# Patient Record
Sex: Female | Born: 1935
Health system: Southern US, Community
[De-identification: ages and names within clinical notes are randomized; demographics above are authoritative.]

## PROBLEM LIST (undated history)

## (undated) DIAGNOSIS — M1990 Unspecified osteoarthritis, unspecified site: Secondary | ICD-10-CM

## (undated) DIAGNOSIS — I471 Supraventricular tachycardia, unspecified: Secondary | ICD-10-CM

## (undated) DIAGNOSIS — I739 Peripheral vascular disease, unspecified: Secondary | ICD-10-CM

## (undated) DIAGNOSIS — I779 Disorder of arteries and arterioles, unspecified: Secondary | ICD-10-CM

## (undated) DIAGNOSIS — I255 Ischemic cardiomyopathy: Secondary | ICD-10-CM

## (undated) DIAGNOSIS — I10 Essential (primary) hypertension: Secondary | ICD-10-CM

## (undated) DIAGNOSIS — E2740 Unspecified adrenocortical insufficiency: Secondary | ICD-10-CM

## (undated) DIAGNOSIS — I639 Cerebral infarction, unspecified: Secondary | ICD-10-CM

## (undated) DIAGNOSIS — I2510 Atherosclerotic heart disease of native coronary artery without angina pectoris: Secondary | ICD-10-CM

## (undated) DIAGNOSIS — E871 Hypo-osmolality and hyponatremia: Secondary | ICD-10-CM

## (undated) DIAGNOSIS — K056 Periodontal disease, unspecified: Secondary | ICD-10-CM

## (undated) DIAGNOSIS — IMO0002 Reserved for concepts with insufficient information to code with codable children: Secondary | ICD-10-CM

## (undated) DIAGNOSIS — E785 Hyperlipidemia, unspecified: Secondary | ICD-10-CM

## (undated) DIAGNOSIS — M8580 Other specified disorders of bone density and structure, unspecified site: Secondary | ICD-10-CM

## (undated) DIAGNOSIS — S72002A Fracture of unspecified part of neck of left femur, initial encounter for closed fracture: Secondary | ICD-10-CM

## (undated) DIAGNOSIS — G459 Transient cerebral ischemic attack, unspecified: Secondary | ICD-10-CM

## (undated) DIAGNOSIS — F419 Anxiety disorder, unspecified: Secondary | ICD-10-CM

## (undated) DIAGNOSIS — T7840XA Allergy, unspecified, initial encounter: Secondary | ICD-10-CM

## (undated) HISTORY — DX: Other specified disorders of bone density and structure, unspecified site: M85.80

## (undated) HISTORY — DX: Disorder of arteries and arterioles, unspecified: I77.9

## (undated) HISTORY — PX: TONSILLECTOMY: SUR1361

## (undated) HISTORY — DX: Atherosclerotic heart disease of native coronary artery without angina pectoris: I25.10

## (undated) HISTORY — DX: Ischemic cardiomyopathy: I25.5

## (undated) HISTORY — DX: Cerebral infarction, unspecified: I63.9

## (undated) HISTORY — DX: Peripheral vascular disease, unspecified: I73.9

## (undated) HISTORY — PX: CARDIAC CATHETERIZATION: SHX172

## (undated) HISTORY — DX: Hyperlipidemia, unspecified: E78.5

## (undated) HISTORY — DX: Transient cerebral ischemic attack, unspecified: G45.9

## (undated) HISTORY — DX: Hypo-osmolality and hyponatremia: E87.1

## (undated) HISTORY — DX: Unspecified osteoarthritis, unspecified site: M19.90

## (undated) HISTORY — DX: Anxiety disorder, unspecified: F41.9

## (undated) HISTORY — DX: Supraventricular tachycardia: I47.1

## (undated) HISTORY — PX: TUBAL LIGATION: SHX77

## (undated) HISTORY — DX: Essential (primary) hypertension: I10

## (undated) HISTORY — DX: Allergy, unspecified, initial encounter: T78.40XA

## (undated) HISTORY — DX: Periodontal disease, unspecified: K05.6

## (undated) HISTORY — DX: Supraventricular tachycardia, unspecified: I47.10

## (undated) HISTORY — DX: Unspecified adrenocortical insufficiency: E27.40

## (undated) HISTORY — DX: Reserved for concepts with insufficient information to code with codable children: IMO0002

## (undated) HISTORY — PX: CHOLECYSTECTOMY: SHX55

---

## 1999-09-05 ENCOUNTER — Other Ambulatory Visit: Admission: RE | Admit: 1999-09-05 | Discharge: 1999-09-05 | Payer: Self-pay | Admitting: *Deleted

## 1999-09-19 ENCOUNTER — Encounter: Payer: Self-pay | Admitting: Family Medicine

## 1999-09-19 ENCOUNTER — Encounter: Admission: RE | Admit: 1999-09-19 | Discharge: 1999-09-19 | Payer: Self-pay | Admitting: Family Medicine

## 2000-09-24 ENCOUNTER — Encounter: Admission: RE | Admit: 2000-09-24 | Discharge: 2000-09-24 | Payer: Self-pay | Admitting: Internal Medicine

## 2000-09-24 ENCOUNTER — Encounter: Payer: Self-pay | Admitting: Internal Medicine

## 2001-07-15 LAB — HM DEXA SCAN

## 2001-09-30 ENCOUNTER — Encounter: Admission: RE | Admit: 2001-09-30 | Discharge: 2001-09-30 | Payer: Self-pay | Admitting: Internal Medicine

## 2001-09-30 ENCOUNTER — Encounter: Payer: Self-pay | Admitting: Internal Medicine

## 2002-01-06 ENCOUNTER — Other Ambulatory Visit: Admission: RE | Admit: 2002-01-06 | Discharge: 2002-01-06 | Payer: Self-pay | Admitting: Family Medicine

## 2002-10-04 ENCOUNTER — Encounter: Payer: Self-pay | Admitting: Family Medicine

## 2002-10-04 ENCOUNTER — Encounter: Admission: RE | Admit: 2002-10-04 | Discharge: 2002-10-04 | Payer: Self-pay | Admitting: Family Medicine

## 2003-03-27 ENCOUNTER — Emergency Department (HOSPITAL_COMMUNITY): Admission: EM | Admit: 2003-03-27 | Discharge: 2003-03-27 | Payer: Self-pay | Admitting: Emergency Medicine

## 2003-03-27 ENCOUNTER — Encounter: Payer: Self-pay | Admitting: Emergency Medicine

## 2003-03-30 ENCOUNTER — Encounter: Admission: RE | Admit: 2003-03-30 | Discharge: 2003-03-30 | Payer: Self-pay | Admitting: Family Medicine

## 2003-03-30 ENCOUNTER — Encounter: Payer: Self-pay | Admitting: Family Medicine

## 2003-05-04 ENCOUNTER — Other Ambulatory Visit: Admission: RE | Admit: 2003-05-04 | Discharge: 2003-05-04 | Payer: Self-pay | Admitting: Family Medicine

## 2003-08-12 ENCOUNTER — Emergency Department (HOSPITAL_COMMUNITY): Admission: EM | Admit: 2003-08-12 | Discharge: 2003-08-13 | Payer: Self-pay | Admitting: Emergency Medicine

## 2003-08-29 ENCOUNTER — Ambulatory Visit (HOSPITAL_COMMUNITY): Admission: RE | Admit: 2003-08-29 | Discharge: 2003-08-29 | Payer: Self-pay | Admitting: Gastroenterology

## 2003-09-12 ENCOUNTER — Encounter (INDEPENDENT_AMBULATORY_CARE_PROVIDER_SITE_OTHER): Payer: Self-pay | Admitting: *Deleted

## 2003-09-12 ENCOUNTER — Ambulatory Visit (HOSPITAL_COMMUNITY): Admission: RE | Admit: 2003-09-12 | Discharge: 2003-09-13 | Payer: Self-pay | Admitting: Surgery

## 2003-10-19 ENCOUNTER — Encounter: Admission: RE | Admit: 2003-10-19 | Discharge: 2003-10-19 | Payer: Self-pay | Admitting: Family Medicine

## 2004-02-21 ENCOUNTER — Inpatient Hospital Stay (HOSPITAL_COMMUNITY): Admission: EM | Admit: 2004-02-21 | Discharge: 2004-02-28 | Payer: Self-pay | Admitting: *Deleted

## 2004-03-04 ENCOUNTER — Emergency Department (HOSPITAL_COMMUNITY): Admission: EM | Admit: 2004-03-04 | Discharge: 2004-03-04 | Payer: Self-pay | Admitting: Emergency Medicine

## 2004-03-15 HISTORY — PX: ESOPHAGOGASTRODUODENOSCOPY: SHX1529

## 2004-03-22 ENCOUNTER — Inpatient Hospital Stay (HOSPITAL_COMMUNITY): Admission: EM | Admit: 2004-03-22 | Discharge: 2004-03-28 | Payer: Self-pay | Admitting: *Deleted

## 2004-03-26 ENCOUNTER — Encounter (INDEPENDENT_AMBULATORY_CARE_PROVIDER_SITE_OTHER): Payer: Self-pay | Admitting: *Deleted

## 2004-05-05 ENCOUNTER — Inpatient Hospital Stay (HOSPITAL_COMMUNITY): Admission: EM | Admit: 2004-05-05 | Discharge: 2004-05-05 | Payer: Self-pay | Admitting: Emergency Medicine

## 2004-05-15 ENCOUNTER — Emergency Department (HOSPITAL_COMMUNITY): Admission: EM | Admit: 2004-05-15 | Discharge: 2004-05-16 | Payer: Self-pay | Admitting: Emergency Medicine

## 2004-05-16 ENCOUNTER — Ambulatory Visit: Payer: Self-pay | Admitting: *Deleted

## 2004-05-25 ENCOUNTER — Ambulatory Visit: Payer: Self-pay | Admitting: Family Medicine

## 2004-05-30 ENCOUNTER — Ambulatory Visit: Payer: Self-pay | Admitting: *Deleted

## 2004-06-13 ENCOUNTER — Ambulatory Visit: Payer: Self-pay | Admitting: Family Medicine

## 2004-06-18 ENCOUNTER — Ambulatory Visit (HOSPITAL_COMMUNITY): Admission: RE | Admit: 2004-06-18 | Discharge: 2004-06-18 | Payer: Self-pay | Admitting: Cardiology

## 2004-06-27 ENCOUNTER — Ambulatory Visit: Payer: Self-pay | Admitting: Family Medicine

## 2004-07-11 ENCOUNTER — Ambulatory Visit: Payer: Self-pay | Admitting: Family Medicine

## 2004-07-25 ENCOUNTER — Ambulatory Visit: Payer: Self-pay | Admitting: Family Medicine

## 2004-08-07 ENCOUNTER — Ambulatory Visit: Payer: Self-pay | Admitting: *Deleted

## 2004-08-17 ENCOUNTER — Ambulatory Visit: Payer: Self-pay | Admitting: Family Medicine

## 2004-08-21 ENCOUNTER — Ambulatory Visit: Payer: Self-pay | Admitting: Professional

## 2004-08-28 ENCOUNTER — Ambulatory Visit: Payer: Self-pay | Admitting: Professional

## 2004-08-31 ENCOUNTER — Ambulatory Visit: Payer: Self-pay | Admitting: Family Medicine

## 2004-09-04 ENCOUNTER — Ambulatory Visit: Payer: Self-pay | Admitting: Professional

## 2004-09-11 ENCOUNTER — Ambulatory Visit: Payer: Self-pay | Admitting: Professional

## 2004-09-14 ENCOUNTER — Ambulatory Visit: Payer: Self-pay | Admitting: Family Medicine

## 2004-09-25 ENCOUNTER — Ambulatory Visit: Payer: Self-pay | Admitting: Professional

## 2004-09-28 ENCOUNTER — Ambulatory Visit: Payer: Self-pay | Admitting: Family Medicine

## 2004-10-09 ENCOUNTER — Ambulatory Visit: Payer: Self-pay | Admitting: Professional

## 2004-10-12 ENCOUNTER — Ambulatory Visit: Payer: Self-pay | Admitting: Family Medicine

## 2004-11-05 ENCOUNTER — Ambulatory Visit: Payer: Self-pay | Admitting: Family Medicine

## 2004-11-05 ENCOUNTER — Encounter: Admission: RE | Admit: 2004-11-05 | Discharge: 2004-11-05 | Payer: Self-pay | Admitting: Family Medicine

## 2004-12-12 ENCOUNTER — Ambulatory Visit: Payer: Self-pay | Admitting: Family Medicine

## 2005-01-07 ENCOUNTER — Ambulatory Visit: Payer: Self-pay | Admitting: Family Medicine

## 2005-05-10 ENCOUNTER — Ambulatory Visit: Payer: Self-pay | Admitting: Family Medicine

## 2005-10-22 ENCOUNTER — Ambulatory Visit: Payer: Self-pay | Admitting: Family Medicine

## 2005-11-06 ENCOUNTER — Encounter: Admission: RE | Admit: 2005-11-06 | Discharge: 2005-11-06 | Payer: Self-pay | Admitting: Family Medicine

## 2005-12-30 ENCOUNTER — Ambulatory Visit: Payer: Self-pay | Admitting: Family Medicine

## 2005-12-30 ENCOUNTER — Other Ambulatory Visit: Admission: RE | Admit: 2005-12-30 | Discharge: 2005-12-30 | Payer: Self-pay | Admitting: Family Medicine

## 2005-12-30 ENCOUNTER — Encounter: Payer: Self-pay | Admitting: Family Medicine

## 2006-01-09 LAB — FECAL OCCULT BLOOD, GUAIAC: Fecal Occult Blood: NEGATIVE

## 2006-01-10 ENCOUNTER — Ambulatory Visit: Payer: Self-pay | Admitting: Family Medicine

## 2006-04-22 ENCOUNTER — Ambulatory Visit: Payer: Self-pay | Admitting: Family Medicine

## 2006-11-20 ENCOUNTER — Encounter: Admission: RE | Admit: 2006-11-20 | Discharge: 2006-11-20 | Payer: Self-pay | Admitting: Family Medicine

## 2006-11-24 ENCOUNTER — Encounter (INDEPENDENT_AMBULATORY_CARE_PROVIDER_SITE_OTHER): Payer: Self-pay | Admitting: *Deleted

## 2006-12-19 ENCOUNTER — Encounter: Payer: Self-pay | Admitting: Family Medicine

## 2006-12-19 DIAGNOSIS — E1169 Type 2 diabetes mellitus with other specified complication: Secondary | ICD-10-CM

## 2006-12-19 DIAGNOSIS — B009 Herpesviral infection, unspecified: Secondary | ICD-10-CM | POA: Insufficient documentation

## 2006-12-19 DIAGNOSIS — M1990 Unspecified osteoarthritis, unspecified site: Secondary | ICD-10-CM | POA: Insufficient documentation

## 2006-12-19 DIAGNOSIS — A048 Other specified bacterial intestinal infections: Secondary | ICD-10-CM | POA: Insufficient documentation

## 2006-12-19 DIAGNOSIS — I2510 Atherosclerotic heart disease of native coronary artery without angina pectoris: Secondary | ICD-10-CM

## 2006-12-19 DIAGNOSIS — E871 Hypo-osmolality and hyponatremia: Secondary | ICD-10-CM | POA: Insufficient documentation

## 2006-12-19 DIAGNOSIS — J309 Allergic rhinitis, unspecified: Secondary | ICD-10-CM | POA: Insufficient documentation

## 2006-12-19 DIAGNOSIS — F411 Generalized anxiety disorder: Secondary | ICD-10-CM | POA: Insufficient documentation

## 2006-12-19 DIAGNOSIS — E11319 Type 2 diabetes mellitus with unspecified diabetic retinopathy without macular edema: Secondary | ICD-10-CM

## 2006-12-19 DIAGNOSIS — E119 Type 2 diabetes mellitus without complications: Secondary | ICD-10-CM

## 2006-12-19 DIAGNOSIS — I10 Essential (primary) hypertension: Secondary | ICD-10-CM | POA: Insufficient documentation

## 2006-12-19 DIAGNOSIS — M8580 Other specified disorders of bone density and structure, unspecified site: Secondary | ICD-10-CM

## 2006-12-19 DIAGNOSIS — E782 Mixed hyperlipidemia: Secondary | ICD-10-CM

## 2006-12-19 DIAGNOSIS — E1351 Other specified diabetes mellitus with diabetic peripheral angiopathy without gangrene: Secondary | ICD-10-CM | POA: Insufficient documentation

## 2006-12-22 ENCOUNTER — Ambulatory Visit: Payer: Self-pay | Admitting: Family Medicine

## 2006-12-23 LAB — CONVERTED CEMR LAB
Albumin: 3.9 g/dL (ref 3.5–5.2)
BUN: 27 mg/dL — ABNORMAL HIGH (ref 6–23)
Calcium: 9.7 mg/dL (ref 8.4–10.5)
Chloride: 105 meq/L (ref 96–112)
Cholesterol: 147 mg/dL (ref 0–200)
Eosinophils Absolute: 0.2 10*3/uL (ref 0.0–0.6)
Eosinophils Relative: 1.7 % (ref 0.0–5.0)
GFR calc Af Amer: 70 mL/min
Glucose, Bld: 196 mg/dL — ABNORMAL HIGH (ref 70–99)
HCT: 39.9 % (ref 36.0–46.0)
HDL: 32.1 mg/dL — ABNORMAL LOW (ref >39.0)
Hemoglobin: 13.8 g/dL (ref 12.0–15.0)
MCV: 90.2 fL (ref 78.0–100.0)
Monocytes Relative: 8 % (ref 3.0–11.0)
Neutrophils Relative %: 54.6 % (ref 43.0–77.0)
Phosphorus: 3.8 mg/dL (ref 2.3–4.6)
Potassium: 5.6 meq/L — ABNORMAL HIGH (ref 3.5–5.1)
VLDL: 43 mg/dL — ABNORMAL HIGH (ref 0–40)

## 2007-01-28 ENCOUNTER — Ambulatory Visit: Payer: Self-pay | Admitting: Family Medicine

## 2007-02-27 ENCOUNTER — Ambulatory Visit: Payer: Self-pay | Admitting: Family Medicine

## 2007-04-15 ENCOUNTER — Ambulatory Visit: Payer: Self-pay | Admitting: Family Medicine

## 2007-04-20 ENCOUNTER — Ambulatory Visit: Payer: Self-pay | Admitting: Family Medicine

## 2007-04-22 LAB — CONVERTED CEMR LAB
Albumin: 3.8 g/dL (ref 3.5–5.2)
BUN: 28 mg/dL — ABNORMAL HIGH (ref 6–23)
CO2: 26 meq/L (ref 19–32)
GFR calc Af Amer: 79 mL/min
GFR calc non Af Amer: 66 mL/min
Glucose, Bld: 115 mg/dL — ABNORMAL HIGH (ref 70–99)
Hgb A1c MFr Bld: 6.9 % — ABNORMAL HIGH (ref 4.6–6.0)
Sodium: 137 meq/L (ref 135–145)

## 2007-08-17 ENCOUNTER — Ambulatory Visit: Payer: Self-pay | Admitting: Family Medicine

## 2007-08-18 LAB — CONVERTED CEMR LAB
ALT: 21 units/L (ref 0–35)
AST: 25 units/L (ref 0–37)
CO2: 27 meq/L (ref 19–32)
Chloride: 103 meq/L (ref 96–112)
Creatinine, Ser: 1.1 mg/dL (ref 0.4–1.2)
Creatinine,U: 153 mg/dL
GFR calc non Af Amer: 52 mL/min
Hgb A1c MFr Bld: 6.5 % — ABNORMAL HIGH (ref 4.6–6.0)
LDL Cholesterol: 67 mg/dL (ref 0–99)
Microalb Creat Ratio: 4.6 mg/g (ref 0.0–30.0)
Microalb, Ur: 0.7 mg/dL (ref 0.0–1.9)
Phosphorus: 4.1 mg/dL (ref 2.3–4.6)
Triglycerides: 162 mg/dL — ABNORMAL HIGH (ref 0–149)
VLDL: 32 mg/dL (ref 0–40)

## 2007-12-01 ENCOUNTER — Encounter: Admission: RE | Admit: 2007-12-01 | Discharge: 2007-12-01 | Payer: Self-pay | Admitting: Family Medicine

## 2007-12-04 ENCOUNTER — Encounter (INDEPENDENT_AMBULATORY_CARE_PROVIDER_SITE_OTHER): Payer: Self-pay | Admitting: *Deleted

## 2008-02-15 ENCOUNTER — Ambulatory Visit: Payer: Self-pay | Admitting: Family Medicine

## 2008-02-16 LAB — CONVERTED CEMR LAB
ALT: 17 units/L (ref 0–35)
Albumin: 3.5 g/dL (ref 3.5–5.2)
CO2: 26 meq/L (ref 19–32)
Chloride: 101 meq/L (ref 96–112)
Cholesterol: 121 mg/dL (ref 0–200)
Direct LDL: 63.9 mg/dL
GFR calc Af Amer: 70 mL/min
GFR calc non Af Amer: 58 mL/min
Phosphorus: 3.1 mg/dL (ref 2.3–4.6)
Total CHOL/HDL Ratio: 4.5
Triglycerides: 227 mg/dL (ref 0–149)

## 2008-04-13 ENCOUNTER — Ambulatory Visit: Payer: Self-pay | Admitting: Family Medicine

## 2008-05-16 ENCOUNTER — Ambulatory Visit: Payer: Self-pay | Admitting: Family Medicine

## 2008-05-17 LAB — CONVERTED CEMR LAB
ALT: 19 units/L (ref 0–35)
CO2: 29 meq/L (ref 19–32)
Calcium: 9.7 mg/dL (ref 8.4–10.5)
Cholesterol: 141 mg/dL (ref 0–200)
GFR calc Af Amer: 79 mL/min
GFR calc non Af Amer: 65 mL/min
HDL: 34.4 mg/dL — ABNORMAL LOW (ref >39.0)
Hgb A1c MFr Bld: 6.7 % — ABNORMAL HIGH (ref 4.6–6.0)
Phosphorus: 4.4 mg/dL (ref 2.3–4.6)
Sodium: 139 meq/L (ref 135–145)
VLDL: 48 mg/dL — ABNORMAL HIGH (ref 0–40)

## 2009-03-08 ENCOUNTER — Ambulatory Visit: Payer: Self-pay | Admitting: Family Medicine

## 2009-03-09 LAB — CONVERTED CEMR LAB
Albumin: 4 g/dL (ref 3.5–5.2)
Basophils Absolute: 0 10*3/uL (ref 0.0–0.1)
Basophils Relative: 0.1 % (ref 0.0–3.0)
CO2: 27 meq/L (ref 19–32)
Chloride: 105 meq/L (ref 96–112)
Creatinine,U: 198.4 mg/dL
Eosinophils Relative: 4 % (ref 0.0–5.0)
HDL: 32.1 mg/dL — ABNORMAL LOW (ref >39.00)
Hgb A1c MFr Bld: 7.1 % — ABNORMAL HIGH (ref 4.6–6.5)
LDL Cholesterol: 50 mg/dL (ref 0–99)
Lymphocytes Relative: 33.4 % (ref 12.0–46.0)
Lymphs Abs: 3.4 10*3/uL (ref 0.7–4.0)
Microalb Creat Ratio: 7.1 mg/g (ref 0.0–30.0)
Neutro Abs: 5.7 10*3/uL (ref 1.4–7.7)
Neutrophils Relative %: 56.9 % (ref 43.0–77.0)
Phosphorus: 4 mg/dL (ref 2.3–4.6)
RDW: 12.3 % (ref 11.5–14.6)
Sodium: 140 meq/L (ref 135–145)
Total CHOL/HDL Ratio: 4
Triglycerides: 185 mg/dL — ABNORMAL HIGH (ref 0.0–149.0)

## 2009-03-21 ENCOUNTER — Encounter: Admission: RE | Admit: 2009-03-21 | Discharge: 2009-03-21 | Payer: Self-pay | Admitting: Family Medicine

## 2009-03-24 ENCOUNTER — Encounter (INDEPENDENT_AMBULATORY_CARE_PROVIDER_SITE_OTHER): Payer: Self-pay | Admitting: *Deleted

## 2009-04-25 ENCOUNTER — Ambulatory Visit: Payer: Self-pay | Admitting: Family Medicine

## 2009-06-14 ENCOUNTER — Ambulatory Visit: Payer: Self-pay | Admitting: Family Medicine

## 2009-06-14 LAB — CONVERTED CEMR LAB: Hgb A1c MFr Bld: 7.1 % — ABNORMAL HIGH (ref 4.6–6.5)

## 2009-06-20 ENCOUNTER — Ambulatory Visit: Payer: Self-pay | Admitting: Family Medicine

## 2009-08-16 ENCOUNTER — Ambulatory Visit: Payer: Self-pay | Admitting: Family Medicine

## 2009-08-28 ENCOUNTER — Ambulatory Visit: Payer: Self-pay | Admitting: Family Medicine

## 2009-09-18 ENCOUNTER — Ambulatory Visit: Payer: Self-pay | Admitting: Family Medicine

## 2009-09-19 LAB — CONVERTED CEMR LAB
CO2: 26 meq/L (ref 19–32)
Calcium: 9.7 mg/dL (ref 8.4–10.5)
Cholesterol: 120 mg/dL (ref 0–200)
Direct LDL: 55.3 mg/dL
HDL: 36.8 mg/dL — ABNORMAL LOW (ref >39.00)
Hgb A1c MFr Bld: 6.7 % — ABNORMAL HIGH (ref 4.6–6.5)
Sodium: 137 meq/L (ref 135–145)
Triglycerides: 332 mg/dL — ABNORMAL HIGH (ref 0.0–149.0)
VLDL: 66.4 mg/dL — ABNORMAL HIGH (ref 0.0–40.0)

## 2009-09-25 ENCOUNTER — Ambulatory Visit: Payer: Self-pay | Admitting: Family Medicine

## 2009-09-25 ENCOUNTER — Encounter: Admission: RE | Admit: 2009-09-25 | Discharge: 2009-09-25 | Payer: Self-pay | Admitting: Family Medicine

## 2010-02-13 ENCOUNTER — Ambulatory Visit: Payer: Self-pay | Admitting: Family Medicine

## 2010-02-14 LAB — CONVERTED CEMR LAB
ALT: 16 units/L (ref 0–35)
AST: 21 units/L (ref 0–37)
Albumin: 3.8 g/dL (ref 3.5–5.2)
CO2: 29 meq/L (ref 19–32)
Chloride: 110 meq/L (ref 96–112)
Creatinine, Ser: 0.9 mg/dL (ref 0.4–1.2)
GFR calc non Af Amer: 66.76 mL/min (ref >60)
HDL: 28.5 mg/dL — ABNORMAL LOW (ref >39.00)
Hgb A1c MFr Bld: 6.6 % — ABNORMAL HIGH (ref 4.6–6.5)
LDL Cholesterol: 51 mg/dL (ref 0–99)
Microalb Creat Ratio: 1.4 mg/g (ref 0.0–30.0)
Phosphorus: 4.5 mg/dL (ref 2.3–4.6)
Potassium: 5.5 meq/L — ABNORMAL HIGH (ref 3.5–5.1)
Sodium: 143 meq/L (ref 135–145)

## 2010-02-20 ENCOUNTER — Ambulatory Visit: Payer: Self-pay | Admitting: Family Medicine

## 2010-02-28 ENCOUNTER — Encounter: Payer: Self-pay | Admitting: Family Medicine

## 2010-03-06 ENCOUNTER — Ambulatory Visit: Payer: Self-pay | Admitting: Family Medicine

## 2010-03-06 DIAGNOSIS — E875 Hyperkalemia: Secondary | ICD-10-CM

## 2010-03-07 ENCOUNTER — Encounter: Payer: Self-pay | Admitting: Family Medicine

## 2010-03-07 LAB — CONVERTED CEMR LAB: Potassium: 5.3 meq/L — ABNORMAL HIGH (ref 3.5–5.1)

## 2010-03-21 ENCOUNTER — Ambulatory Visit: Payer: Self-pay | Admitting: Family Medicine

## 2010-03-22 LAB — CONVERTED CEMR LAB: Potassium: 5.5 meq/L — ABNORMAL HIGH

## 2010-03-27 ENCOUNTER — Ambulatory Visit: Payer: Self-pay | Admitting: Family Medicine

## 2010-04-03 ENCOUNTER — Ambulatory Visit: Payer: Self-pay | Admitting: Family Medicine

## 2010-04-20 ENCOUNTER — Telehealth: Payer: Self-pay | Admitting: Family Medicine

## 2010-05-08 ENCOUNTER — Ambulatory Visit: Payer: Self-pay | Admitting: Family Medicine

## 2010-05-09 ENCOUNTER — Encounter: Admission: RE | Admit: 2010-05-09 | Discharge: 2010-05-09 | Payer: Self-pay | Admitting: Family Medicine

## 2010-05-15 ENCOUNTER — Encounter: Payer: Self-pay | Admitting: Family Medicine

## 2010-05-21 ENCOUNTER — Encounter: Admission: RE | Admit: 2010-05-21 | Discharge: 2010-05-21 | Payer: Self-pay | Admitting: Family Medicine

## 2010-06-14 HISTORY — PX: GUM SURGERY: SHX658

## 2010-08-04 ENCOUNTER — Encounter: Payer: Self-pay | Admitting: Family Medicine

## 2010-08-12 LAB — CONVERTED CEMR LAB
CO2: 29 meq/L (ref 19–32)
Calcium: 9.9 mg/dL (ref 8.4–10.5)
Cortisol, Plasma: 5.3 ug/dL
Creatinine, Ser: 0.8 mg/dL (ref 0.4–1.2)
GFR calc non Af Amer: 74.5 mL/min (ref >60)
Glucose, Bld: 125 mg/dL — ABNORMAL HIGH (ref 70–99)

## 2010-08-16 NOTE — Assessment & Plan Note (Signed)
Summary: 3 MONTH FOLLOW UP/[facility]   Vital Signs:  Patient profile:   77 year old female Height:      61.5 inches Weight:      124.50 pounds BMI:     23.23 Temp:     97.8 degrees F oral Pulse rate:   64 / minute Pulse rhythm:   regular BP sitting:   130 / 70  (left arm) Cuff size:   regular  Vitals Entered By: Lewanda Rife LPN (September 25, 2009 9:19 AM)  Serial Vital Signs/Assessments:  Time      Position  BP       Pulse  Resp  Temp     By                     130/70                         Kairy Part MD   History of Present Illness: here for f/u of DM and HTN and DM  wt is down 1 lb  bp 144/70 on first check  DM is imp with AIC 6.7 (down from 7.1)) is happy about that  cut out her sweets/ chocolate  not much exercise due to uri  opthy was in oct - Dr Larence Penning  saw Dr Hetty Ely twice -- for uri  gets better than worse  cough gets worse at times- prod for 6 weeks  took doxycycline no fever  some allergies   chol in good control but trig 332 (up from prev) ? what she ate the day before -- ate lunch at church    K a little high at 5.3- is on lisinopril  no supplements  eats a banana every day   Allergies: 1)  ! * Ilosone 2)  ! * Lotrel 3)  Erythromycin 4)  * Plavix 5)  Ace Inhibitors 6)  * Pneumovax  Past History:  Past Medical History: Last updated: 02/15/2008 Allergic rhinitis Anxiety Diabetes mellitus, type II Hyperlipidemia Hypertension Osteoarthritis Osteopenia Peripheral vascular disease OA/spurs in toe 08  cardiology-- Dr Lucas Mallow (retired 09)  Past Surgical History: Last updated: Jan 01, 2007 Cholecystectomy Tubal ligation Tonsillectomy Persantine cardiolite- normal (2001) Dexa- osteopenia hips (1999) osteopenia hip, fem neck (2003) HIDA scan - abn (08/2003) Cardiac cath- stents x 2, PTCA x 1 (02/2004) EGD- neg. 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  no sinus tenderness  Eyes:  Conjunctiva clear bilaterally.  Ears:  R ear normal and L ear normal.   Nose:  no nasal discharge.   Mouth:  pharynx pink and moist.   Neck:  supple with full rom and no masses or thyromegally, no JVD or carotid bruit  Lungs:  CTA with harsh bs at bases - no rales or rhonchi  or wheeze Heart:  Normal rate and regular rhythm. S1 and S2 normal without gallop, murmur, click, rub or other extra sounds. Abdomen:  Bowel sounds positive,abdomen soft and non-tender without masses, organomegaly or hernias noted. no renal bruits Msk:  No deformity or scoliosis noted of thoracic or lumbar spine.   Pulses:  plus one pedal pulses  Extremities:  No clubbing, cyanosis, edema, or deformity noted with normal full range of motion of all joints.   Neurologic:  sensation intact to light touch, gait normal, and DTRs symmetrical and normal.   Skin:  Intact without suspicious lesions or rashes Cervical Nodes:  No lymphadenopathy noted Psych:  normal affect, talkative and pleasant   Diabetes Management Exam:    Foot Exam (with socks and/or shoes not present):       Sensory-Pinprick/Light touch:          Left medial foot (L-4): normal          Left dorsal foot (L-5): normal          Left lateral foot (S-1): normal          Right medial foot (L-4): normal          Right dorsal foot (L-5): normal          Right lateral foot (S-1): normal       Sensory-Monofilament:          Left foot: normal           Right foot: normal       Inspection:          Left foot: normal          Right foot: normal       Nails:          Left foot: normal          Right foot: normal    Eye Exam:       Eye Exam done elsewhere          Date: 04/14/2009          Results: diabetic retinopathy          Done by: Dr Larence Penning    Impression & Recommendations:  Problem # 1:  COUGH (ICD-786.2) Assessment New ongoing 6 weeks after uri and tx with doxy-- is slowly imp but still prod  harsh bs on exam no wheeze send for cxr  if neg and cough continues -may need to consider poss of ace cough Orders: Radiology Referral (Radiology)  Problem # 2:  HYPERTENSION (ICD-401.9) Assessment: Unchanged  bp better on second check today no change in med if K goes up -may have to change ace  adv to stop bananas and will re check Her updated medication list for this problem includes:    Toprol Xl 100 Mg Tb24 (Metoprolol succinate) .Marland Kitchen... Take one by mouth daily    Lisinopril 40 Mg Tabs (Lisinopril) ..... One by mouth daily    Norvasc 5 Mg Tabs (Amlodipine besylate) .Marland Kitchen... Take one by mouth at bedtime  BP today: 144/70- re check 130/70 Prior BP: 148/74 (08/28/2009)  Labs Reviewed: K+: 5.3 (09/18/2009) Creat: : 1.1 (09/18/2009)   Chol: 120 (09/18/2009)   HDL: 36.80 (09/18/2009)   LDL: 50 (03/08/2009)   TG: 332.0 (09/18/2009)  Problem # 3:  DIABETES MELLITUS, TYPE II (ICD-250.00) Assessment: Improved  commended on imp with this and diet  will get back to exercise when able utd opthy - q 6 mo  f/u and lab in 6 mo  Her updated medication list for this problem includes:    Sg Enteric Coated Aspirin Tbec (Aspirin tbec) .Marland Kitchen... Take 81 mg by mouth daily    Lisinopril 40 Mg Tabs (Lisinopril) ..... One by mouth daily  Metformin Hcl 1000 Mg Tabs (Metformin hcl) .Marland Kitchen... 1 by mouth two times a day    Glucotrol Xl 5 Mg Xr24h-tab (Glipizide) .Marland Kitchen... 1 by mouth once daily in am  Labs Reviewed: Creat: 1.1 (09/18/2009)     Last Eye  Exam: normal (04/14/2008) Reviewed HgBA1c results: 6.7 (09/18/2009)  7.1 (06/14/2009)  Problem # 4:  HYPERLIPIDEMIA (ICD-272.4) Assessment: Unchanged  good control- do not know why trig high- may have been eating out day before re check 6 mo rev fats in diet  get back to exercise  Her updated medication list for this problem includes:    Lipitor 10 Mg Tabs (Atorvastatin calcium) .Marland Kitchen... Take one by mouth at bedtime  Labs Reviewed: SGOT: 28 (09/18/2009)   SGPT: 20 (09/18/2009)   HDL:36.80 (09/18/2009), 32.10 (03/08/2009)  LDL:50 (03/08/2009), DEL (09/81/1914)  Chol:120 (09/18/2009), 119 (03/08/2009)  Trig:332.0 (09/18/2009), 185.0 (03/08/2009)  Complete Medication List: 1)  Toprol Xl 100 Mg Tb24 (Metoprolol succinate) .... Take one by mouth daily 2)  Lipitor 10 Mg Tabs (Atorvastatin calcium) .... Take one by mouth at bedtime 3)  Sg Enteric Coated Aspirin Tbec (Aspirin tbec) .... Take 81 mg by mouth daily 4)  Lisinopril 40 Mg Tabs (Lisinopril) .... One by mouth daily 5)  Norvasc 5 Mg Tabs (Amlodipine besylate) .... Take one by mouth at bedtime 6)  Paxil 40 Mg Tabs (Paroxetine hcl) .... Take one by mouth daily 7)  Precision Extra Diabetes Test Strips  .... To check glucose once daily and as needed (250.0) 8)  Metformin Hcl 1000 Mg Tabs (Metformin hcl) .Marland Kitchen.. 1 by mouth two times a day 9)  Cinnamon 500 Mg Caps (Cinnamon) .... One by mouth three times a day 10)  Glucotrol Xl 5 Mg Xr24h-tab (Glipizide) .Marland Kitchen.. 1 by mouth once daily in am 11)  Tussionex Pennkinetic Er 8-10 Mg/7ml Lqcr (Chlorpheniramine-hydrocodone) .... One tsp by mouth at night as needed for cough 12)  Benzonatate 200 Mg Caps (Benzonatate) .... One tab by mouth three times a day as needed cough during day 13)  Mucinex 600 Mg Xr12h-tab (Guaifenesin) .... Take one twice a day  Patient Instructions: 1)  keep working on healthy diet and exercise  2)  stop eating the banana per day , avoid oj and cantelope and also vitamins with  potassium 3)  schedule fasting lab in 6 months lipid/ast/alt/renal/ AIC /microalbumin 272, 401.1, 250.0 and then follow up  4)  we will schedule cxr at check out  Prescriptions: GLUCOTROL XL 5 MG XR24H-TAB (GLIPIZIDE) 1 by mouth once daily in am  #30 x 11   Entered and Authorized by:   Dorismar Part MD   Signed by:   Lorren Part MD on 09/25/2009   Method used:   Print then Give to Patient   RxID:   [id number]   Current Allergies (reviewed today): ! * ILOSONE ! * LOTREL ERYTHROMYCIN * PLAVIX ACE INHIBITORS * PNEUMOVAX

## 2010-08-16 NOTE — Assessment & Plan Note (Signed)
Summary: BP CHECK / [facility]   Nurse Visit   Vital Signs:  Patient profile:   77 year old female Height:      61.5 inches Weight:      117.75 pounds BMI:     21.97 Temp:     98.0 degrees F BP sitting:   120 / 70  (left arm) Cuff size:   regular  Vitals Entered By: Benny Lennert CMA Duncan Dull) (March 06, 2010 9:52 AM)  History of Present Illness: Chief complaint Dr. towers patient here for b/p check nurse visit   Allergies: 1)  ! * Ilosone 2)  ! * Lotrel 3)  Erythromycin 4)  * Plavix 5)  Ace Inhibitors 6)  * Pneumovax  Orders Added: 1)  Est. Patient Level I [29937]  Current Allergies (reviewed today): ! * ILOSONE ! * LOTREL ERYTHROMYCIN * PLAVIX ACE INHIBITORS * PNEUMOVAX

## 2010-08-16 NOTE — Assessment & Plan Note (Signed)
Summary: CONGESTION/COUGH/DLO   Vital Signs:  Patient profile:   77 year old female Weight:      125.75 pounds Temp:     98.4 degrees F oral Pulse rate:   60 / minute Pulse rhythm:   regular BP sitting:   148 / 74  (left arm) Cuff size:   regular  Vitals Entered By: Sydell Axon LPN (August 28, 2009 12:33 PM) CC: Productive cough/ yellow and thick   History of Present Illness: Pt was seen by me three weeks ago. Her chest is sore from coughing and has had stress inconmtinence when she coughs. She is very tired as she is not sleeping well due to cough either. She is achey as well but it sounds like that could be from the muscle use of coughing.  Problems Prior to Update: 1)  Uri  (ICD-465.9) 2)  Other Screening Mammogram  (ICD-V76.12) 3)  Fatigue  (ICD-780.79) 4)  Leg Cramps  (ICD-729.82) 5)  Hx of Diabetic Retinopathy  (ICD-250.50) 6)  Hx of Hyponatremia  (ICD-276.1) 7)  Hx of Cad  (ICD-414.00) 8)  Hx of Helicobacter Pylori Infection  (ICD-041.86) 9)  Hx of Hsv  (ICD-054.9) 10)  Peripheral Vascular Disease  (ICD-443.9) 11)  Osteopenia  (ICD-733.90) 12)  Osteoarthritis  (ICD-715.90) 13)  Hypertension  (ICD-401.9) 14)  Hyperlipidemia  (ICD-272.4) 15)  Diabetes Mellitus, Type II  (ICD-250.00) 16)  Anxiety  (ICD-300.00) 17)  Allergic Rhinitis  (ICD-477.9)  Medications Prior to Update: 1)  Toprol Xl 100 Mg Tb24 (Metoprolol Succinate) .... Take One By Mouth Daily 2)  Lipitor 10 Mg Tabs (Atorvastatin Calcium) .... Take One By Mouth At Bedtime 3)  Sg Enteric Coated Aspirin  Tbec (Aspirin Tbec) .... Take 81 Mg By Mouth Daily 4)  Lisinopril 40 Mg  Tabs (Lisinopril) .... One By Mouth Daily 5)  Norvasc 5 Mg Tabs (Amlodipine Besylate) .... Take One By Mouth At Bedtime 6)  Paxil 40 Mg Tabs (Paroxetine Hcl) .... Take One By Mouth Daily 7)  Precision Extra Diabetes Test Strips .... To Check Glucose Once Daily and As Needed (250.0) 8)  Metformin Hcl 1000 Mg  Tabs (Metformin Hcl) .Marland Kitchen.. 1  By Mouth Two Times A Day 9)  Cinnamon 500 Mg  Caps (Cinnamon) .... One By Mouth Three Times A Day 10)  Glucotrol Xl 5 Mg Xr24h-Tab (Glipizide) .Marland Kitchen.. 1 By Mouth Once Daily in Am 11)  Tessalon 200 Mg Caps (Benzonatate) .... One Tab By Mouth Three Times A Day  Allergies: 1)  ! * Ilosone 2)  ! * Lotrel 3)  Erythromycin 4)  * Plavix 5)  Ace Inhibitors 6)  * Pneumovax  Physical Exam  General:  Well-developed,well-nourished,in no acute distress; alert,appropriate and cooperative throughout examination, slightly congested. Head:  normocephalic, atraumatic, and no abnormalities observed.  Sinuses min tend max. distr R>L. Eyes:  Conjunctiva clear bilaterally.  Ears:  R ear normal and L ear normal.   Nose:  no nasal discharge.   Mouth:  pharynx pink and moist.   Neck:  supple with full rom and no masses or thyromegally, no JVD or carotid bruit  Lungs:  Normal respiratory effort, chest expands symmetrically. Lungs are clear to auscultation, no crackles or wheezes. Heart:  Normal rate and regular rhythm. S1 and S2 normal without gallop, murmur, click, rub or other extra sounds.   Impression & Recommendations:  Problem # 1:  BRONCHITIS- ACUTE (ICD-466.0) Assessment New Add Doxycycline, more Tessalon as needed  and Tussionex at night. Cont everything  else and add Vicks and Lozenge regularly. Her updated medication list for this problem includes:    Tessalon 200 Mg Caps (Benzonatate) ..... One tab by mouth three times a day    Doxycycline Hyclate 100 Mg Tabs (Doxycycline hyclate) ..... One tab by mouth two times a day for two weeks.    Tussionex Pennkinetic Er 8-10 Mg/76ml Lqcr (Chlorpheniramine-hydrocodone) ..... One tsp by mouth at night as needed for cough    Benzonatate 200 Mg Caps (Benzonatate) ..... One tab by mouth three times a day as needed cough during day  Complete Medication List: 1)  Toprol Xl 100 Mg Tb24 (Metoprolol succinate) .... Take one by mouth daily 2)  Lipitor 10 Mg Tabs  (Atorvastatin calcium) .... Take one by mouth at bedtime 3)  Sg Enteric Coated Aspirin Tbec (Aspirin tbec) .... Take 81 mg by mouth daily 4)  Lisinopril 40 Mg Tabs (Lisinopril) .... One by mouth daily 5)  Norvasc 5 Mg Tabs (Amlodipine besylate) .... Take one by mouth at bedtime 6)  Paxil 40 Mg Tabs (Paroxetine hcl) .... Take one by mouth daily 7)  Precision Extra Diabetes Test Strips  .... To check glucose once daily and as needed (250.0) 8)  Metformin Hcl 1000 Mg Tabs (Metformin hcl) .Marland Kitchen.. 1 by mouth two times a day 9)  Cinnamon 500 Mg Caps (Cinnamon) .... One by mouth three times a day 10)  Glucotrol Xl 5 Mg Xr24h-tab (Glipizide) .Marland Kitchen.. 1 by mouth once daily in am 11)  Tessalon 200 Mg Caps (Benzonatate) .... One tab by mouth three times a day 12)  Doxycycline Hyclate 100 Mg Tabs (Doxycycline hyclate) .... One tab by mouth two times a day for two weeks. 13)  Tussionex Pennkinetic Er 8-10 Mg/55ml Lqcr (Chlorpheniramine-hydrocodone) .... One tsp by mouth at night as needed for cough 14)  Benzonatate 200 Mg Caps (Benzonatate) .... One tab by mouth three times a day as needed cough during day Prescriptions: BENZONATATE 200 MG CAPS (BENZONATATE) one tab by mouth three times a day as needed cough during day  #40 x 0   Entered and Authorized by:   Shaune Leeks MD   Signed by:   Shaune Leeks MD on 08/28/2009   Method used:   Print then Give to Patient   RxID:   [id number] TUSSIONEX PENNKINETIC ER 8-10 MG/5ML LQCR (CHLORPHENIRAMINE-HYDROCODONE) one tsp by mouth at night as needed for cough  #8 oz x o   Entered and Authorized by:   Shaune Leeks MD   Signed by:   Shaune Leeks MD on 08/28/2009   Method used:   Print then Give to Patient   RxID:   [id number] DOXYCYCLINE HYCLATE 100 MG TABS (DOXYCYCLINE HYCLATE) one tab by mouth two times a day for two weeks.  #28 x 0   Entered and Authorized by:   Shaune Leeks MD   Signed by:   Shaune Leeks MD on  08/28/2009   Method used:   Print then Give to Patient   RxID:   [id number]   Current Allergies (reviewed today): ! * ILOSONE ! * LOTREL ERYTHROMYCIN * PLAVIX ACE INHIBITORS * PNEUMOVAX

## 2010-08-16 NOTE — Letter (Signed)
Summary: [facility]   Imported By: Lanelle Bal 06/14/2010 13:41:32  _____________________________________________________________________  External Attachment:    Type:   Image     Comment:   External Document  Appended Document: [facility]     Clinical Lists Changes  Observations: Added new observation of DMEYEEXAMNXT: 05/2011 (06/18/2010 16:38) Added new observation of DMEYEEXMRES: normal (04/30/2010 16:38) Added new observation of EYE EXAM BY: Dr Larence Penning (04/30/2010 16:38) Added new observation of DIAB EYE EX: normal (04/30/2010 16:38)        Diabetes Management Exam:    Eye Exam:       Eye Exam done elsewhere          Date: 04/30/2010          Results: normal          Done by: Dr Larence Penning

## 2010-08-16 NOTE — Assessment & Plan Note (Signed)
Summary: FLU SHOT / LFW   Nurse Visit   Allergies: 1)  ! * Ilosone 2)  ! * Lotrel 3)  Erythromycin 4)  * Plavix 5)  Ace Inhibitors 6)  * Pneumovax  Immunizations Administered:  Influenza Vaccine # 1:    Vaccine Type: Fluvax 3+    Site: left deltoid    Mfr: GlaxoSmithKline    Dose: 0.5 ml    Route: IM    Given by: Mervin Hack CMA (AAMA)    Exp. Date: 01/12/2011    Lot #: [id number]    VIS given: 02/06/10 version given April 03, 2010.  Flu Vaccine Consent Questions:    Do you have a history of severe allergic reactions to this vaccine? no    Any prior history of allergic reactions to egg and/or gelatin? no    Do you have a sensitivity to the preservative Thimersol? no    Do you have a past history of Guillan-Barre Syndrome? no    Do you currently have an acute febrile illness? no    Have you ever had a severe reaction to latex? no    Vaccine information given and explained to patient? yes    Are you currently pregnant? no  Orders Added: 1)  Flu Vaccine 33yrs + [90658] 2)  Admin 1st Vaccine [09811]

## 2010-08-16 NOTE — Assessment & Plan Note (Signed)
Summary: FOLLOW UP K LAB WORK PER DR TOWER/RI   Vital Signs:  Patient profile:   77 year old female Height:      61.5 inches Weight:      125 pounds BMI:     23.32 Temp:     97.9 degrees F oral Pulse rate:   60 / minute Pulse rhythm:   regular BP sitting:   118 / 76  (left arm) Cuff size:   regular  Vitals Entered By: Lewanda Rife LPN (March 27, 2010 11:41 AM) CC: f/u after K labs   History of Present Illness: here for f/u of high K level -- is mild but significant K is 5.5  no change after stopping ace is on B blocker  not on nsaid now -- very rarely will take an advil or an aleve  last aleve 1 month ago   DM control has been good AIC 6.6-- sugars have been fine   has had some cramps in her hands and in her calves (also has arthritis )  occ joints in hands will hurt from that   no blood donation  no trauma   no renal insuff on labs  no vitamins with K not eating any high K foods (used to eat bananas and stopped it )   no urine symptoms at all        Allergies: 1)  ! * Ilosone 2)  ! * Lotrel 3)  Erythromycin 4)  * Plavix 5)  Ace Inhibitors 6)  * Pneumovax  Past History:  Past Medical History: Last updated: 02/15/2008 Allergic rhinitis Anxiety Diabetes mellitus, type II Hyperlipidemia Hypertension Osteoarthritis Osteopenia Peripheral vascular disease OA/spurs in toe 08  cardiology-- Dr Lucas Mallow (retired 09)  Past Surgical History: Last updated: 12/19/2006 Cholecystectomy Tubal ligation Tonsillectomy Persantine cardiolite- normal (2001) Dexa- osteopenia hips (1999) osteopenia hip, fem neck (2003) HIDA scan - abn (08/2003) Cardiac cath- stents x 2, PTCA x 1 (02/2004) EGD- neg. 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 Eyes:  Denies blurring and eye irritation. CV:  Denies chest pain or discomfort and lightheadness. Resp:  Denies cough, shortness of breath, and wheezing. GI:  Denies diarrhea, nausea, and vomiting. GU:  Denies abnormal vaginal bleeding, dysuria, and urinary frequency. MS:  Complains of cramps; denies muscle aches. Derm:  Denies itching, lesion(s), poor wound healing, and rash. Neuro:  Denies numbness and tingling. Psych:  Denies anxiety and depression. Endo:  Denies excessive thirst and excessive urination. Heme:  Denies abnormal bruising and bleeding.  Physical Exam  General:  Well-developed,well-nourished,in no acute distress; alert,appropriate and cooperative throughout examination Head:  normocephalic, atraumatic, and no abnormalities observed.   Eyes:  vision grossly intact, pupils equal, pupils round, and pupils reactive to light.  no conjunctival pallor, injection or icterus  Ears:  R ear normal and L ear normal.   Neck:  supple with full rom and no masses or thyromegally, no JVD or carotid bruit  Lungs:  Normal respiratory effort, chest expands symmetrically. Lungs are clear to auscultation, no crackles or wheezes. Heart:  Normal rate and regular rhythm. S1 and S2 normal without gallop, murmur, click, rub or other extra sounds. Abdomen:  Bowel sounds positive,abdomen soft and non-tender without masses, organomegaly or hernias noted. no renal bruits  Msk:  costrovertebral angle tenderness  Pulses:  R and L carotid,radial,femoral,dorsalis pedis and posterior tibial pulses are full and equal bilaterally Extremities:  No clubbing, cyanosis, edema, or deformity noted with normal full range of  motion of all joints.   Neurologic:  sensation intact to light touch, gait normal, and DTRs symmetrical and normal.   Skin:  Intact without suspicious lesions or rashes Cervical Nodes:  No lymphadenopathy noted Psych:  normal affect, talkative and pleasant    Impression & Recommendations:  Problem # 1:  HYPERKALEMIA (ICD-276.7) Assessment Deteriorated unsure of etiology no renal insuff known and ace was stopped - but K remains high nl EKG check renal and cortisol and aldosterone today-- ? if adrenal abn  then update and make plan unlikely from b blocker- but may need to consider that if all neg- ? check urine lytes Orders: Venipuncture (16109) TLB-Cortisol (82533-CORT) T- * Misc. Laboratory test [id number] TLB-Renal Function Panel (80069-RENAL) Specimen Handling (09811) EKG w/ Interpretation (93000)  Complete Medication List: 1)  Toprol Xl 100 Mg Tb24 (Metoprolol succinate) .... Take one by mouth daily 2)  Lipitor 10 Mg Tabs (Atorvastatin calcium) .... Take one by mouth at bedtime 3)  Sg Enteric Coated Aspirin Tbec (Aspirin tbec) .... Take 81 mg by mouth daily 4)  Norvasc 5 Mg Tabs (Amlodipine besylate) .... Take one by mouth at bedtime 5)  Paxil 40 Mg Tabs (Paroxetine hcl) .... Take one by mouth daily 6)  Precision Extra Diabetes Test Strips  .... To check glucose once daily and as needed (250.0) 7)  Metformin Hcl 1000 Mg Tabs (Metformin hcl) .Marland Kitchen.. 1 by mouth two times a day 8)  Cinnamon 500 Mg Caps (Cinnamon) .... One by mouth three times a day 9)  Glucotrol Xl 5 Mg Xr24h-tab (Glipizide) .Marland Kitchen.. 1 by mouth once daily in am  Patient Instructions: 1)  EKG looks good 2)  continue to avoid high potassium foods 3)  further labs today for high potassium 4)  will make plan based on these  Current Allergies (reviewed today): ! * ILOSONE ! * LOTREL ERYTHROMYCIN * PLAVIX ACE INHIBITORS * PNEUMOVAX   EKG  Procedure date:  03/27/2010  Findings:      Normal with mild  bradycardia at 57 no acute changes

## 2010-08-16 NOTE — Progress Notes (Signed)
Summary: needs new glucometer  Phone Note Call from Patient   Caller: Patient Call For: Alphonsine Part MD Summary of Call: Pt is asking that a script for a prescision extra glucometer be sent to [facility] [address].  She has strips, just needs the machine, hers has stopped working.  A written script will need to be [facility]ed to [facility] at fax [phone].  Script will need to have directions on how many times to test, dx code. Initial call taken by: Lowella Petties CMA,  April 20, 2010 11:12 AM  Follow-up for Phone Call        given good control I think once daily testing is fine px printed out for fax   Follow-up by: Jaimi Part MD,  April 20, 2010 11:48 AM  Additional Follow-up for Phone Call Additional follow up Details #1::        rx faxed to [facility] rd [phone] as instructed.Lewanda Rife LPN  April 20, 2010 2:54 PM     New/Updated Medications: * GLUCOMETER to check sugar once daily and as needed for DM2 250.0 Prescriptions: GLUCOMETER to check sugar once daily and as needed for DM2 250.0  #1 x 0   Entered and Authorized by:   Pam Part MD   Signed by:   Kemaya Part MD on 04/20/2010   Method used:   Printed then faxed to ...         RxID:   [id number]   Appended Document: needs new glucometer Medications Added * PRECISION EXTRA GLUCOMETER AND STRIPS to check sugar once daily and as needed for DM2 250.0          Clinical Lists Changes  Medications: Changed medication from * GLUCOMETER to check sugar once daily and as needed for DM2 250.0 to * PRECISION EXTRA GLUCOMETER AND STRIPS to check sugar once daily and as needed for DM2 250.0 - Signed Rx of PRECISION EXTRA GLUCOMETER AND STRIPS to check sugar once daily and as needed for DM2 250.0;  #1, 60 strips x 1;  Signed;  Entered by: Nazli Part MD;  Authorized by: Colon Flattery Tower MD;  Method used: Print then Give to Patient    Prescriptions: PRECISION EXTRA GLUCOMETER AND STRIPS to check  sugar once daily and as needed for DM2 250.0  #1, 60 strips x 1   Entered and Authorized by:   Ginna Part MD   Signed by:   Quinta Part MD on 04/23/2010   Method used:   Print then Give to Patient   RxID:   [id number]

## 2010-08-16 NOTE — Assessment & Plan Note (Signed)
Summary: ROA FOR LAB FOLLOW-UP/JRR   Vital Signs:  Patient profile:   77 year old female Height:      61.5 inches Weight:      117.75 pounds BMI:     21.97 Temp:     98.0 degrees F oral Pulse rate:   60 / minute Pulse rhythm:   regular BP sitting:   120 / 70  (left arm) Cuff size:   regular  Vitals Entered By: Linde Gillis CMA Duncan Dull) 03-09-10 8:55 AM) CC: follow-up visit after labs   History of Present Illness: here for f/u of HTN and DM and lipids with new hyperkalemia   is feeling good - nothing new   HTN well controlled with 120/70 toay  DM stable AIC 6.6 opthy up to date -- has appt thurs -- has twice yearly appts   has to go to periodontist -- gum problems   K is high at 5.5-- ? from ace  she even cut down on bananas    lipids stable - low hdl and Ldl -- 51 just joined silver sneakers at [employer]-- waiting for her card   wt is down 7 lb -- has generally cut back eating and more veg      Allergies: 1)  ! * Ilosone 2)  ! * Lotrel 3)  Erythromycin 4)  * Plavix 5)  Ace Inhibitors 6)  * Pneumovax  Past History:  Past Medical History: Last updated: 02/15/2008 Allergic rhinitis Anxiety Diabetes mellitus, type II Hyperlipidemia Hypertension Osteoarthritis Osteopenia Peripheral vascular disease OA/spurs in toe 08  cardiology-- Dr Lucas Mallow (retired 09)  Past Surgical History: Last updated: 12/19/2006 Cholecystectomy Tubal ligation Tonsillectomy Persantine cardiolite- normal (2001) Dexa- osteopenia hips (1999) osteopenia hip, fem neck (2003) HIDA scan - abn (08/2003) Cardiac cath- stents x 2, PTCA x 1 (02/2004) EGD- neg. 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 Eyes:  Denies blurring and eye irritation. CV:  Denies chest pain or discomfort, lightheadness, and palpitations. Resp:  Denies cough and wheezing. GI:  Denies abdominal pain, change in bowel habits, and indigestion. GU:  Denies urinary frequency. MS:  Denies muscle aches and cramps. Derm:  Denies lesion(s), poor wound healing, and rash. Neuro:  Denies numbness and tingling. Endo:  Denies cold intolerance, excessive thirst, excessive urination, and heat intolerance. Heme:  Denies abnormal bruising and bleeding.  Physical Exam  General:  Well-developed,well-nourished,in no acute distress; alert,appropriate and cooperative throughout examination Head:  normocephalic, atraumatic, and no abnormalities observed.   Eyes:  vision grossly intact, pupils equal, pupils round, and pupils reactive to light.  no conjunctival pallor, injection or icterus  Mouth:  pharynx pink and moist.   Neck:  supple with full rom and no masses or thyromegally, no JVD or carotid bruit  Lungs:  Normal respiratory effort, chest expands symmetrically. Lungs are clear to auscultation, no crackles or wheezes. Heart:  Normal rate and regular rhythm.  S1 and S2 normal without gallop, murmur, click, rub or other extra sounds. Abdomen:  Bowel sounds positive,abdomen soft and non-tender without masses, organomegaly or hernias noted. no renal bruits  Msk:  No deformity or scoliosis noted of thoracic or lumbar spine.  no acute joint  changes Pulses:  R and L carotid,radial,femoral,dorsalis pedis and posterior tibial pulses are full and equal bilaterally Extremities:  No  clubbing, cyanosis, edema, or deformity noted with normal full range of motion of all joints.   Neurologic:  sensation intact to light touch, gait normal, and DTRs symmetrical and normal.   Skin:  Intact without suspicious lesions or rashes Cervical Nodes:  No lymphadenopathy noted Psych:  normal affect, talkative and pleasant   Diabetes Management Exam:    Foot Exam (with socks and/or shoes not present):       Sensory-Pinprick/Light touch:          Left medial foot (L-4): normal          Left dorsal foot (L-5): normal          Left lateral foot (S-1): normal          Right medial foot (L-4): normal          Right dorsal foot (L-5): normal          Right lateral foot (S-1): normal       Sensory-Monofilament:          Left foot: normal          Right foot: normal       Inspection:          Left foot: normal          Right foot: normal       Nails:          Left foot: normal          Right foot: normal   Impression & Recommendations:  Problem # 1:  HYPERTENSION (ICD-401.9) Assessment Unchanged  well controlled but will have to cut ace dose due to high K level  re check K and bp in 2 wk on 20 of lisinopril enc exercise Her updated medication list for this problem includes:    Toprol Xl 100 Mg Tb24 (Metoprolol succinate) .Marland Kitchen... Take one by mouth daily    Lisinopril 20 Mg Tabs (Lisinopril) .Marland Kitchen... 1 by mouth once daily    Norvasc 5 Mg Tabs (Amlodipine besylate) .Marland Kitchen... Take one by mouth at bedtime  BP today: 120/70 Prior BP: 130/70 (09/25/2009)  Labs Reviewed: K+: 5.5 (02/13/2010) Creat: : 0.9 (02/13/2010)   Chol: 112 (02/13/2010)   HDL: 28.50 (02/13/2010)   LDL: 51 (02/13/2010)   TG: 164.0 (02/13/2010)  Orders: Prescription Created Electronically [id number])  Problem # 2:  HYPERLIPIDEMIA (ICD-272.4) Assessment: Unchanged  this is well controlled - but hdl is low  enc more exercise labs reviewed today f/u 6 mo  Her updated medication list for this problem includes:     Lipitor 10 Mg Tabs (Atorvastatin calcium) .Marland Kitchen... Take one by mouth at bedtime  Labs Reviewed: SGOT: 21 (02/13/2010)   SGPT: 16 (02/13/2010)   HDL:28.50 (02/13/2010), 36.80 (09/18/2009)  LDL:51 (02/13/2010), 50 (03/08/2009)  Chol:112 (02/13/2010), 120 (09/18/2009)  Trig:164.0 (02/13/2010), 332.0 (09/18/2009)  Orders: Prescription Created Electronically [phone])  Problem # 3:  DIABETES MELLITUS, TYPE II (ICD-250.00) Assessment: Unchanged  good control- is stable no change in med lab rev up to date opthy good foot care disc healthy diet (low simple sugar/ choose complex carbs/ low sat  fat) diet and exercise in detail  Her updated medication list for this problem includes:    Sg Enteric Coated Aspirin Tbec (Aspirin tbec) .Marland Kitchen... Take 81 mg by mouth daily    Lisinopril 20 Mg Tabs (Lisinopril) .Marland Kitchen... 1 by mouth once daily    Metformin Hcl 1000 Mg Tabs (Metformin hcl) .Marland Kitchen... 1 by mouth two times a day    Glucotrol Xl 5 Mg Xr24h-tab (Glipizide) .Marland Kitchen... 1 by mouth once daily in am  Labs Reviewed: Creat: 0.9 (02/13/2010)     Last Eye Exam: diabetic retinopathy (04/14/2009) Reviewed HgBA1c results: 6.6 (02/13/2010)  6.7 (09/18/2009)  Orders: Prescription Created Electronically [phone])  Complete Medication List: 1)  Toprol Xl 100 Mg Tb24 (Metoprolol succinate) .... Take one by mouth daily 2)  Lipitor 10 Mg Tabs (Atorvastatin calcium) .... Take one by mouth at bedtime 3)  Sg Enteric Coated Aspirin Tbec (Aspirin tbec) .... Take 81 mg by mouth daily 4)  Lisinopril 20 Mg Tabs (Lisinopril) .Marland Kitchen.. 1 by mouth once daily 5)  Norvasc 5 Mg Tabs (Amlodipine besylate) .... Take one by mouth at bedtime 6)  Paxil 40 Mg Tabs (Paroxetine hcl) .... Take one by mouth daily 7)  Precision Extra Diabetes Test Strips  .... To check glucose once daily and as needed (250.0) 8)  Metformin Hcl 1000 Mg Tabs (Metformin hcl) .Marland Kitchen.. 1 by mouth two times a day 9)  Cinnamon 500 Mg Caps (Cinnamon) .... One by mouth three times a  day 10)  Glucotrol Xl 5 Mg Xr24h-tab (Glipizide) .Marland Kitchen.. 1 by mouth once daily in am  Patient Instructions: 1)  cut your lisinopril 40 mg in half -- take 1/2 per day and then new px will equal 20  2)  schedule non fasting lab in 2 weeks for K level (for hyperkalemia) - and also that day check bp and send report to me  3)  keep up the good diet and add the exercise  4)  no other med changes  5)  follow up in 6 months  Prescriptions: GLUCOTROL XL 5 MG XR24H-TAB (GLIPIZIDE) 1 by mouth once daily in am  #30 x 11   Entered and Authorized by:   Aribelle Part MD   Signed by:   Vena Part MD on 02/20/2010   Method used:   Electronically to        [facility] [address] (retail)       [address], [address]       [place], [place]  [address]       Ph: [phone]       Fax: [phone]   RxID:   [id number] METFORMIN HCL 1000 MG  TABS (METFORMIN HCL) 1 by mouth two times a day  #60 x 11   Entered and Authorized by:   Takiyah Part MD   Signed by:   Alie Part MD on 02/20/2010   Method used:   Electronically to        [facility] [address] (retail)       [address], [address]       [place], [place]  [address]       Ph: [phone]       Fax: [phone]   RxID:   [id number] PAXIL 40 MG TABS (PAROXETINE HCL) take one by mouth daily  #30 x 11   Entered and Authorized  by:   Meleah Part MD   Signed by:   Geraldy Part MD on 02/20/2010   Method used:   Electronically to        [facility] [address] (retail)       [address], [address]       [place], [place]  [address]       Ph: [phone]       Fax: [phone]   RxID:   [id number] NORVASC 5 MG TABS (AMLODIPINE BESYLATE) take one by mouth at bedtime  #30 x 11   Entered and Authorized by:   Seraphim Part MD   Signed by:   Katerina Part MD on 02/20/2010   Method used:   Electronically to        [facility] [address] (retail)       [address], [address]       [place], [place]  [address]       Ph: [phone]       Fax: [phone]   RxID:   [id number] LISINOPRIL 20 MG TABS (LISINOPRIL) 1 by mouth once daily  #30 x 11   Entered and Authorized by:   Dilia Part MD   Signed by:   Azilee Part MD on 02/20/2010   Method used:   Electronically to        [facility] [address] (retail)       [address], [address]       [place], [place]  [address]       Ph: [phone]       Fax: [phone]   RxID:   [id number] LIPITOR 10 MG TABS (ATORVASTATIN CALCIUM) take one by mouth at bedtime  #30 x 11   Entered and Authorized by:   Xariah Part MD   Signed by:   Amina Part MD on 02/20/2010   Method used:   Electronically to        [facility] [address] (retail)       [address], [address]       [place], [place]  [address]       Ph: [phone]       Fax: [phone]   RxID:   [id number] TOPROL XL 100 MG TB24 (METOPROLOL SUCCINATE) take one by mouth daily  #30 x 11   Entered and Authorized by:   Romelia Part MD   Signed by:   Braniyah Part MD on 02/20/2010   Method used:   Electronically to        [facility] [address] ([facility]ail)       [address], [address]       [place], [place]  [address]       Ph: [phone]       Fax: [phone]   RxID:   [id number]   Current Allergies (reviewed today): ! * ILOSONE ! * LOTREL ERYTHROMYCIN * PLAVIX ACE INHIBITORS * PNEUMOVAX     Preventive Care Screening  Contraindications of Treatment or Deferment of Test/Procedure:    Treatment: Pneumovax    Contraindication: adverse rxn/side effects

## 2010-08-16 NOTE — Assessment & Plan Note (Signed)
Summary: BP CHECK/RI   Nurse Visit ZO:XWRUEAV present today for blood pressure ck at the request of the physician.  Relevant hx: Patient present  today for blood pressure check. Pt stopped Lisinopril as instructed. Pt has no complaints. Pt had blood drawn in left arm prior to BP check and that is why BP was taken in rt arm.  pt can be reached at [phone]. If no answer please leave message. Pt uses [facility] [address] if pharmacy is needed.Lewanda Rife LPN  March 21, 2010 11:54 AM    Vital Signs:  Patient profile:   77 year old female Weight:      127.25 pounds BMI:     23.74 Temp:     99 degrees F Pulse rate:   60 / minute Pulse rhythm:   regular BP sitting:   144 / 64  (right arm) Cuff size:   regular  Vitals Entered By: Lewanda Rife LPN (March 21, 2010 11:53 AM) CC: BP check   Allergies: 1)  ! * Ilosone 2)  ! * Lotrel 3)  Erythromycin 4)  * Plavix 5)  Ace Inhibitors 6)  * Pneumovax  Orders Added: 1)  Est. Patient Level I [[address]]    blood pressure is up a bit but not too bad let her know that and I will inst further after lab result- thanks  Appended Document: BP CHECK/RI Patient notified as instructed by telephone.

## 2010-08-16 NOTE — Assessment & Plan Note (Signed)
Summary: COLD/CONGESTION/COUGH/RBH   Vital Signs:  Patient profile:   77 year old female Weight:      126 pounds Temp:     98.4 degrees F oral Pulse rate:   64 / minute Pulse rhythm:   regular BP sitting:   134 / 62  (left arm) Cuff size:   regular  Vitals Entered By: Sydell Axon LPN (August 16, 2009 11:56 AM) CC: Head congestion, productive cough/yellow and some tightness in chest   History of Present Illness: Pt here for sinus congestion and cough for three weeks. Cough now holding on and wearing her out. She has no fever but stays cold that is normal for her. She has some headache over eyes and in face which has been puffy, her ears don't hurt, has some rhinitis that is clear and occas bloody, cough occas thick lite yellow, ST started all of this but basically gone, no SOB but has upper chest tightness and wakes up with throat feeling closed up. No N/V. She has taken Zyrtec initially, some sugar free cough drops.   Problems Prior to Update: 1)  Other Screening Mammogram  (ICD-V76.12) 2)  Fatigue  (ICD-780.79) 3)  Leg Cramps  (ICD-729.82) 4)  Hx of Diabetic Retinopathy  (ICD-250.50) 5)  Hx of Hyponatremia  (ICD-276.1) 6)  Hx of Cad  (ICD-414.00) 7)  Hx of Helicobacter Pylori Infection  (ICD-041.86) 8)  Hx of Hsv  (ICD-054.9) 9)  Peripheral Vascular Disease  (ICD-443.9) 10)  Osteopenia  (ICD-733.90) 11)  Osteoarthritis  (ICD-715.90) 12)  Hypertension  (ICD-401.9) 13)  Hyperlipidemia  (ICD-272.4) 14)  Diabetes Mellitus, Type II  (ICD-250.00) 15)  Anxiety  (ICD-300.00) 16)  Allergic Rhinitis  (ICD-477.9)  Medications Prior to Update: 1)  Toprol Xl 100 Mg Tb24 (Metoprolol Succinate) .... Take One By Mouth Daily 2)  Lipitor 10 Mg Tabs (Atorvastatin Calcium) .... Take One By Mouth At Bedtime 3)  Sg Enteric Coated Aspirin  Tbec (Aspirin Tbec) .... Take 81 Mg By Mouth Daily 4)  Lisinopril 40 Mg  Tabs (Lisinopril) .... One By Mouth Daily 5)  Norvasc 5 Mg Tabs (Amlodipine  Besylate) .... Take One By Mouth At Bedtime 6)  Paxil 40 Mg Tabs (Paroxetine Hcl) .... Take One By Mouth Daily 7)  Precision Extra Diabetes Test Strips .... To Check Glucose Once Daily and As Needed (250.0) 8)  Metformin Hcl 1000 Mg  Tabs (Metformin Hcl) .Marland Kitchen.. 1 By Mouth Two Times A Day 9)  Cinnamon 500 Mg  Caps (Cinnamon) .... One By Mouth Three Times A Day 10)  Glucotrol Xl 5 Mg Xr24h-Tab (Glipizide) .Marland Kitchen.. 1 By Mouth Once Daily in Am  Allergies: 1)  ! * Ilosone 2)  ! * Lotrel 3)  Erythromycin 4)  * Plavix 5)  Ace Inhibitors 6)  * Pneumovax  Physical Exam  General:  Well-developed,well-nourished,in no acute distress; alert,appropriate and cooperative throughout examination, slightly congested. Head:  normocephalic, atraumatic, and no abnormalities observed.  Sinuses min tend max. distr. Eyes:  Conjunctiva clear bilaterally.  Ears:  R ear normal and L ear normal.   Nose:  no nasal discharge.   Mouth:  pharynx pink and moist.   Neck:  supple with full rom and no masses or thyromegally, no JVD or carotid bruit  Lungs:  Normal respiratory effort, chest expands symmetrically. Lungs are clear to auscultation, no crackles or wheezes. Heart:  Normal rate and regular rhythm. S1 and S2 normal without gallop, murmur, click, rub or other extra sounds.  Impression & Recommendations:  Problem # 1:  URI (ICD-465.9) Assessment New  See instructions. Her updated medication list for this problem includes:    Sg Enteric Coated Aspirin Tbec (Aspirin tbec) .Marland Kitchen... Take 81 mg by mouth daily    Tessalon 200 Mg Caps (Benzonatate) ..... One tab by mouth three times a day  Instructed on symptomatic treatment. Call if symptoms persist or worsen.   Complete Medication List: 1)  Toprol Xl 100 Mg Tb24 (Metoprolol succinate) .... Take one by mouth daily 2)  Lipitor 10 Mg Tabs (Atorvastatin calcium) .... Take one by mouth at bedtime 3)  Sg Enteric Coated Aspirin Tbec (Aspirin tbec) .... Take 81 mg by mouth  daily 4)  Lisinopril 40 Mg Tabs (Lisinopril) .... One by mouth daily 5)  Norvasc 5 Mg Tabs (Amlodipine besylate) .... Take one by mouth at bedtime 6)  Paxil 40 Mg Tabs (Paroxetine hcl) .... Take one by mouth daily 7)  Precision Extra Diabetes Test Strips  .... To check glucose once daily and as needed (250.0) 8)  Metformin Hcl 1000 Mg Tabs (Metformin hcl) .Marland Kitchen.. 1 by mouth two times a day 9)  Cinnamon 500 Mg Caps (Cinnamon) .... One by mouth three times a day 10)  Glucotrol Xl 5 Mg Xr24h-tab (Glipizide) .Marland Kitchen.. 1 by mouth once daily in am 11)  Tessalon 200 Mg Caps (Benzonatate) .... One tab by mouth three times a day  Patient Instructions: 1)  Take Guaifenesin by going to [facility] or [facility] and getting MUCOUS RELIEF EXPECTORANT (400mg ), take 11/2 tabs by mouth AM and NOON. 2)  Drink lots of fluids anytime taking Guaifenesin.  3)  keep lozenge. 4)  Tessalon three times a day  Prescriptions: TESSALON 200 MG CAPS (BENZONATATE) one tab by mouth three times a day  #40 x 0   Entered and Authorized by:   Shaune Leeks MD   Signed by:   Shaune Leeks MD on 08/16/2009   Method used:   Electronically to        [facility] [address] ([facility])       [address]       [place], [place]  [address]       Ph: [phone]       Fax: [phone]   RxID:   [id number]   Current Allergies (reviewed today): ! * ILOSONE ! * LOTREL ERYTHROMYCIN * PLAVIX ACE INHIBITORS * PNEUMOVAX

## 2010-08-16 NOTE — Miscellaneous (Signed)
Summary: Stopping Lisinopril med list updated   Clinical Lists Changes  Medications: Removed medication of LISINOPRIL 20 MG TABS (LISINOPRIL) 1 by mouth once daily     Current Allergies: ! * ILOSONE ! * LOTREL ERYTHROMYCIN * PLAVIX ACE INHIBITORS * PNEUMOVAX

## 2010-08-24 ENCOUNTER — Other Ambulatory Visit: Payer: Self-pay | Admitting: Family Medicine

## 2010-08-24 ENCOUNTER — Ambulatory Visit (INDEPENDENT_AMBULATORY_CARE_PROVIDER_SITE_OTHER): Payer: MEDICARE | Admitting: Family Medicine

## 2010-08-24 ENCOUNTER — Encounter: Payer: Self-pay | Admitting: Family Medicine

## 2010-08-24 DIAGNOSIS — E875 Hyperkalemia: Secondary | ICD-10-CM

## 2010-08-24 DIAGNOSIS — E119 Type 2 diabetes mellitus without complications: Secondary | ICD-10-CM

## 2010-08-24 DIAGNOSIS — E785 Hyperlipidemia, unspecified: Secondary | ICD-10-CM

## 2010-08-24 DIAGNOSIS — I10 Essential (primary) hypertension: Secondary | ICD-10-CM

## 2010-08-24 LAB — RENAL FUNCTION PANEL
Albumin: 3.9 g/dL (ref 3.5–5.2)
BUN: 26 mg/dL — ABNORMAL HIGH (ref 6–23)
Chloride: 101 mEq/L (ref 96–112)
Creatinine, Ser: 0.9 mg/dL (ref 0.4–1.2)
GFR: 64.13 mL/min (ref >60.00)
Glucose, Bld: 238 mg/dL — ABNORMAL HIGH (ref 70–99)
Phosphorus: 3.6 mg/dL (ref 2.3–4.6)

## 2010-08-30 NOTE — Assessment & Plan Note (Signed)
Summary: FOLLOW-UP/LFW   Vital Signs:  Patient profile:   77 year old female Height:      61.5 inches Weight:      129.25 pounds BMI:     24.11 Temp:     98.1 degrees F oral Pulse rate:   60 / minute Pulse rhythm:   regular BP sitting:   128 / 70  (left arm) Cuff size:   regular  Vitals Entered By: Lewanda Rife LPN (August 24, 2010 11:26 AM) CC: six month f/u, Lipid Management   History of Present Illness: here for f/u of HTN and lipids and DM  is feeling good   sometime she has some aching in neck and shoulders from her degenerative disc dz  no numbness or weakness  is worse if she has to look up a lot   wt is up 4 lb is trying to eat a healthy diet  cheerios for breakfast  1/2 sandwhich on brown bread for lunch- ham or [place]  once in a while eats fruit -- watching that for K  some potato chips occas  soup/ or meat item and veg for dinner   is still walking for exercise -- inside at the mall in bad weather    K normalized last check after being elevated for ? reason   HTN in good contro 128/70 today  lipids have been great on lipitor and diet  Last Lipid ProfileCholesterol: 112 (02/13/2010 8:33:54 AM)HDL:  28.50 (02/13/2010 8:33:54 AM)LDL:  51 (02/13/2010 8:33:54 AM)Triglycerides:  Last Liver profileSGOT:  21 (02/13/2010 8:33:54 AM)SPGT:  16 (02/13/2010 8:33:54 AM)T. Bili:  Alk Phos:     DM- - last AIC 6.6 on metformin and glipizide sugars are about the same -- generally 120s or lower - different times of day utd opthy    no trouble with any meds   did have some laser surgery on gums this winter  Lipid Management History:      Positive NCEP/ATP III risk factors include female age 1 years old or older, diabetes, HDL cholesterol less than 40, hypertension, ASHD (either angina/prior MI/prior CABG), and peripheral vascular disease.  Negative NCEP/ATP III risk factors include non-tobacco-user status.    Allergies: 1)  ! * Ilosone 2)  ! * Lotrel 3)   Erythromycin 4)  * Plavix 5)  Ace Inhibitors 6)  * Pneumovax  Past History:  Family History: Last updated: Mar 13, 2010 Father: deceased- CAD, DM Mother: vaginal cancer in situ, HTN, CAD, MI Siblings:  son - AAA and congenital valve problem  Social History: Last updated: 06/20/2009 Marital Status: Married Children: 3 Occupation:  non smoker  has a puppy  Risk Factors: Smoking Status: never (12/19/2006)  Past Medical History: Allergic rhinitis Anxiety Diabetes mellitus, type II Hyperlipidemia Hypertension Osteoarthritis Osteopenia Peripheral vascular disease OA/spurs in toe 08 deg disk in neck  peridontal dz   cardiology-- Dr Lucas Mallow (retired 09)  Past Surgical History: Cholecystectomy Tubal ligation Tonsillectomy Persantine cardiolite- normal (2001) Dexa- osteopenia hips (1999) osteopenia hip, fem neck (2003) HIDA scan - abn (08/2003) Cardiac cath- stents x 2, PTCA x 1 (02/2004) EGD- neg. Gastritis by biopsy (03/2004) Cath- stents patent, LCX disease (03/2004) ECP- external counter pulsation (2006) laser surgery for gums 12/11- for peridontal dz  Review of Systems General:  Denies chills and fever. Eyes:  Denies blurring and eye irritation. CV:  Denies chest pain or discomfort, fatigue, and lightheadness. Resp:  Denies cough and shortness of breath. GI:  Denies abdominal pain, change in bowel  habits, and indigestion. GU:  Denies dysuria and urinary frequency. MS:  Complains of joint pain; denies joint redness and joint swelling. Derm:  Denies lesion(s), poor wound healing, and rash. Neuro:  Denies headaches, numbness, and tingling. Psych:  Denies anxiety and depression. Endo:  Denies cold intolerance, excessive thirst, excessive urination, and heat intolerance. Heme:  Denies abnormal bruising and bleeding.  Physical Exam  General:  Well-developed,well-nourished,in no acute distress; alert,appropriate and cooperative throughout examination Head:   normocephalic, atraumatic, and no abnormalities observed.   Eyes:  vision grossly intact, pupils equal, pupils round, and pupils reactive to light.  no conjunctival pallor, injection or icterus  Mouth:  pharynx pink and moist.   Neck:  supple with full rom and no masses or thyromegally, no JVD or carotid bruit  Chest Wall:  No deformities, masses, or tenderness noted. Lungs:  Normal respiratory effort, chest expands symmetrically. Lungs are clear to auscultation, no crackles or wheezes. Heart:  Normal rate and regular rhythm. S1 and S2 normal without gallop, murmur, click, rub or other extra sounds. Abdomen:  Bowel sounds positive,abdomen soft and non-tender without masses, organomegaly or hernias noted. no renal bruits  Msk:  No deformity or scoliosis noted of thoracic or lumbar spine.  some pain on neck extension Pulses:  R and L carotid,radial,femoral,dorsalis pedis and posterior tibial pulses are full and equal bilaterally Extremities:  No clubbing, cyanosis, edema, or deformity noted with normal full range of motion of all joints.   Neurologic:  sensation intact to light touch, gait normal, and DTRs symmetrical and normal.   Skin:  Intact without suspicious lesions or rashes Cervical Nodes:  No lymphadenopathy noted Inguinal Nodes:  No significant adenopathy Psych:  normal affect, talkative and pleasant   Diabetes Management Exam:    Foot Exam (with socks and/or shoes not present):       Sensory-Pinprick/Light touch:          Left medial foot (L-4): normal          Left dorsal foot (L-5): normal          Left lateral foot (S-1): normal          Right medial foot (L-4): normal          Right dorsal foot (L-5): normal          Right lateral foot (S-1): normal       Sensory-Monofilament:          Left foot: normal          Right foot: normal       Inspection:          Left foot: normal          Right foot: normal       Nails:          Left foot: thickened          Right foot:  thickened   Impression & Recommendations:  Problem # 1:  HYPERKALEMIA (ICD-276.7) Assessment Improved this seemed to resolved itself without reason  labs today- renal panel and adv Orders: Venipuncture (16109) TLB-Renal Function Panel (80069-RENAL) TLB-A1C / Hgb A1C (Glycohemoglobin) (83036-A1C)  Problem # 2:  HYPERTENSION (ICD-401.9) Assessment: Unchanged  very well controled on current med unfortunately- no ace or arb due to hypokalemia  lab today Her updated medication list for this problem includes:    Toprol Xl 100 Mg Tb24 (Metoprolol succinate) .Marland Kitchen... Take one by mouth daily    Norvasc 5 Mg Tabs (Amlodipine besylate) .Marland KitchenMarland KitchenMarland KitchenMarland Kitchen  Take one by mouth at bedtime  Orders: Venipuncture (16109) TLB-Renal Function Panel (80069-RENAL) TLB-A1C / Hgb A1C (Glycohemoglobin) (83036-A1C)  BP today: 128/70 Prior BP: 118/76 (03/27/2010)  Labs Reviewed: K+: 4.8 (05/08/2010) Creat: : 0.8 (03/27/2010)   Chol: 112 (02/13/2010)   HDL: 28.50 (02/13/2010)   LDL: 51 (02/13/2010)   TG: 164.0 (02/13/2010)  Problem # 3:  HYPERLIPIDEMIA (ICD-272.4) Assessment: Unchanged  this has been stable and well controlled on statin and diet will plan to check in aug before check up Her updated medication list for this problem includes:    Lipitor 10 Mg Tabs (Atorvastatin calcium) .Marland Kitchen... Take one by mouth at bedtime  Orders: Venipuncture (60454) TLB-Renal Function Panel (80069-RENAL) TLB-A1C / Hgb A1C (Glycohemoglobin) (83036-A1C)  Labs Reviewed: SGOT: 21 (02/13/2010)   SGPT: 16 (02/13/2010)   HDL:28.50 (02/13/2010), 36.80 (09/18/2009)  LDL:51 (02/13/2010), 50 (03/08/2009)  Chol:112 (02/13/2010), 120 (09/18/2009)  Trig:164.0 (02/13/2010), 332.0 (09/18/2009)  Problem # 4:  DIABETES MELLITUS, TYPE II (ICD-250.00) Assessment: Unchanged  per home sugars- good control do not expect change in AIC today  utd opthy no change in med disc diet - needs more green veg and continue exercise Her updated  medication list for this problem includes:    Sg Enteric Coated Aspirin Tbec (Aspirin tbec) .Marland Kitchen... Take 81 mg by mouth daily    Metformin Hcl 1000 Mg Tabs (Metformin hcl) .Marland Kitchen... 1 by mouth two times a day    Glucotrol Xl 5 Mg Xr24h-tab (Glipizide) .Marland Kitchen... 1 by mouth once daily in am  Orders: Venipuncture (09811) TLB-Renal Function Panel (80069-RENAL) TLB-A1C / Hgb A1C (Glycohemoglobin) (83036-A1C)  Labs Reviewed: Creat: 0.8 (03/27/2010)     Last Eye Exam: normal (04/30/2010) Reviewed HgBA1c results: 6.6 (02/13/2010)  6.7 (09/18/2009)  Complete Medication List: 1)  Toprol Xl 100 Mg Tb24 (Metoprolol succinate) .... Take one by mouth daily 2)  Lipitor 10 Mg Tabs (Atorvastatin calcium) .... Take one by mouth at bedtime 3)  Sg Enteric Coated Aspirin Tbec (Aspirin tbec) .... Take 81 mg by mouth daily 4)  Norvasc 5 Mg Tabs (Amlodipine besylate) .... Take one by mouth at bedtime 5)  Paxil 40 Mg Tabs (Paroxetine hcl) .... Take one by mouth daily 6)  Metformin Hcl 1000 Mg Tabs (Metformin hcl) .Marland Kitchen.. 1 by mouth two times a day 7)  Cinnamon 500 Mg Caps (Cinnamon) .... One by mouth three times a day 8)  Glucotrol Xl 5 Mg Xr24h-tab (Glipizide) .Marland Kitchen.. 1 by mouth once daily in am 9)  Precision Extra Glucometer and Strips  .... To check sugar once daily and as needed for dm2 250.0 10)  Pepcid Ac 10 Mg Tabs (Famotidine) .... Otc as directed.  Lipid Assessment/Plan:      Based on NCEP/ATP III, the patient's risk factor category is "history of coronary disease, peripheral vascular disease, cerebrovascular disease, or aortic aneurysm along with either diabetes, current smoker, or LDL > 130 plus HDL < 40 plus triglycerides > 200".  The patient's lipid goals are as follows: Total cholesterol goal is 200; LDL cholesterol goal is 70; HDL cholesterol goal is 40; Triglyceride goal is 150.     Patient Instructions: 1)  labs today  2)  keep up healthy diet and exercise  3)  schedule 30 minute check up in 6 months  with labs prior    Orders Added: 1)  Venipuncture [36415] 2)  TLB-Renal Function Panel [80069-RENAL] 3)  TLB-A1C / Hgb A1C (Glycohemoglobin) [83036-A1C] 4)  Est. Patient Level IV [91478]    Current  Allergies (reviewed today): ! * ILOSONE ! * LOTREL ERYTHROMYCIN * PLAVIX ACE INHIBITORS * PNEUMOVAX

## 2010-09-19 ENCOUNTER — Ambulatory Visit (INDEPENDENT_AMBULATORY_CARE_PROVIDER_SITE_OTHER): Payer: MEDICARE | Admitting: Family Medicine

## 2010-09-19 ENCOUNTER — Encounter: Payer: Self-pay | Admitting: Family Medicine

## 2010-09-19 DIAGNOSIS — E875 Hyperkalemia: Secondary | ICD-10-CM

## 2010-09-19 DIAGNOSIS — E119 Type 2 diabetes mellitus without complications: Secondary | ICD-10-CM

## 2010-09-25 NOTE — Assessment & Plan Note (Signed)
Summary: f/u with blood sugar log/ri   Vital Signs:  Patient profile:   77 year old female Height:      61.5 inches Weight:      128.25 pounds BMI:     23.93 Temp:     97.6 degrees F oral Pulse rate:   60 / minute Pulse rhythm:   regular BP sitting:   130 / 68  (left arm) Cuff size:   regular  Vitals Entered By: Lewanda Rife LPN (September 18, 1608 12:06 PM) CC: f.u with blood sugar log   History of Present Illness: here for f/u of DM and also hyperkalemia   wt is down 1 lb with bmi of 23  bp ok   k last back up to 5.5 _ ? etiol was ref to renal   last sugar was 238 with AIC 7.5- (up from 6.6) on metformin and glucotrol xl pt had gum surgery nov and dec -- and limited to liquids and soft foods and that was really hard  is done with all of that now - and back to nl diet   am sugars go anywhere from 120s to 160s fasting  pm sugars tend to be in low 100s   has a new machine that she thinks is accurate   is eating much healthier  some exercise- needs to get out more -- likes to go to mall to walk 3-5 per week   Allergies: 1)  ! * Ilosone 2)  ! * Lotrel 3)  Erythromycin 4)  * Plavix 5)  Ace Inhibitors 6)  * Pneumovax  Past History:  Past Medical History: Last updated: 08/24/2010 Allergic rhinitis Anxiety Diabetes mellitus, type II Hyperlipidemia Hypertension Osteoarthritis Osteopenia Peripheral vascular disease OA/spurs in toe 08 deg disk in neck  peridontal dz   cardiology-- Dr Lucas Mallow (retired 09)  Past Surgical History: Last updated: 08/24/2010 Cholecystectomy Tubal ligation Tonsillectomy Persantine cardiolite- normal (2001) Dexa- osteopenia hips (1999) osteopenia hip, fem neck (2003) HIDA scan - abn (08/2003) Cardiac cath- stents x 2, PTCA x 1 (02/2004) EGD- neg. Gastritis by biopsy (03/2004) Cath- stents patent, LCX disease (03/2004) ECP- external counter pulsation (2006) laser surgery for gums 12/11- for peridontal dz  Family History: Last  updated: March 20, 2010 Father: deceased- CAD, DM Mother: vaginal cancer in situ, HTN, CAD, MI Siblings:  son - AAA and congenital valve problem  Social History: Last updated: 06/20/2009 Marital Status: Married Children: 3 Occupation:  non smoker  has a puppy  Risk Factors: Smoking Status: never (12/19/2006)  Review of Systems General:  Denies fatigue, loss of appetite, and malaise. Eyes:  Denies blurring and eye irritation. CV:  Denies chest pain or discomfort, palpitations, and swelling of feet. Resp:  Denies cough, pleuritic, and shortness of breath. GI:  Denies indigestion, nausea, and vomiting. GU:  Denies urinary frequency. MS:  Denies stiffness. Derm:  Denies itching, lesion(s), poor wound healing, and rash. Neuro:  Denies headaches, numbness, and tingling. Heme:  Denies abnormal bruising and bleeding.  Physical Exam  General:  Well-developed,well-nourished,in no acute distress; alert,appropriate and cooperative throughout examination Head:  normocephalic, atraumatic, and no abnormalities observed.   Eyes:  vision grossly intact, pupils equal, pupils round, and pupils reactive to light.  no conjunctival pallor, injection or icterus  Mouth:  pharynx pink and moist.   Neck:  supple with full rom and no masses or thyromegally, no JVD or carotid bruit  Chest Wall:  No deformities, masses, or tenderness noted. Lungs:  Normal respiratory effort,  chest expands symmetrically. Lungs are clear to auscultation, no crackles or wheezes. Heart:  Normal rate and regular rhythm. S1 and S2 normal without gallop, murmur, click, rub or other extra sounds. Msk:  No deformity or scoliosis noted of thoracic or lumbar spine.  some pain on neck extension Pulses:  R and L carotid,radial,femoral,dorsalis pedis and posterior tibial pulses are full and equal bilaterally Extremities:  No clubbing, cyanosis, edema, or deformity noted with normal full range of motion of all joints.   Neurologic:   sensation intact to light touch, gait normal, and DTRs symmetrical and normal.   Skin:  Intact without suspicious lesions or rashes Cervical Nodes:  No lymphadenopathy noted Psych:  normal affect, talkative and pleasant   Diabetes Management Exam:    Foot Exam (with socks and/or shoes not present):       Sensory-Pinprick/Light touch:          Left medial foot (L-4): normal          Left dorsal foot (L-5): normal          Left lateral foot (S-1): normal          Right medial foot (L-4): normal          Right dorsal foot (L-5): normal          Right lateral foot (S-1): normal       Sensory-Monofilament:          Left foot: normal          Right foot: normal       Inspection:          Left foot: normal          Right foot: normal       Nails:          Left foot: normal          Right foot: normal   Impression & Recommendations:  Problem # 1:  HYPERKALEMIA (ICD-276.7) Assessment Comment Only pt has upcoming renal f/u for this will continue to follow  is avoiding high K foods in diet   Problem # 2:  DIABETES MELLITUS, TYPE II (ICD-250.00) Assessment: Improved  sugar results are much better than the labs would predict  rev them in detail- peaking in am (? cortisol)  urged exercise 5 d per week  no change in med now  lab in may incl [facility] and f/u if still over 7 may inc her glucotrol Her updated medication list for this problem includes:    Sg Enteric Coated Aspirin Tbec (Aspirin tbec) .Marland Kitchen... Take 81 mg by mouth daily    Metformin Hcl 1000 Mg Tabs (Metformin hcl) .Marland Kitchen... 1 by mouth two times a day    Glucotrol Xl 5 Mg Xr24h-tab (Glipizide) .Marland Kitchen... 1 by mouth once daily in am  Labs Reviewed: Creat: 0.9 (08/24/2010)     Last Eye Exam: normal (04/30/2010) Reviewed HgBA1c results: 7.5 (08/24/2010)  6.6 (02/13/2010)  Complete Medication List: 1)  Toprol Xl 100 Mg Tb24 (Metoprolol succinate) .... Take one by mouth daily 2)  Lipitor 10 Mg Tabs (Atorvastatin calcium) .... Take one  by mouth at bedtime 3)  Sg Enteric Coated Aspirin Tbec (Aspirin tbec) .... Take 81 mg by mouth daily 4)  Norvasc 5 Mg Tabs (Amlodipine besylate) .... Take one by mouth at bedtime 5)  Paxil 40 Mg Tabs (Paroxetine hcl) .... Take one by mouth daily 6)  Metformin Hcl 1000 Mg Tabs (Metformin hcl) .Marland Kitchen.. 1 by mouth two times  a day 7)  Cinnamon 500 Mg Caps (Cinnamon) .... One by mouth three times a day 8)  Glucotrol Xl 5 Mg Xr24h-tab (Glipizide) .Marland Kitchen.. 1 by mouth once daily in am 9)  Precision Extra Glucometer and Strips  .... To check sugar once daily and as needed for dm2 250.0 10)  Pepcid Ac 10 Mg Tabs (Famotidine) .... Otc as directed. 11)  Probiotic Caps (Probiotic product) .... Otc as directed.  Patient Instructions: 1)  blood sugars are looking better 2)  continue to avoid bedtime snack - your sugars are higher in am  3)  your high AIc last time was likely related to diet and your gum surgery 4)  follow up with kidney doctor as planned  5)  schedule fasting labs in mid may and then follow up renal / AIC/ lipid/ast/alt 250.0, 272    Orders Added: 1)  Est. Patient Level III [16109]    Current Allergies (reviewed today): ! * ILOSONE ! * LOTREL ERYTHROMYCIN * PLAVIX ACE INHIBITORS * PNEUMOVAX

## 2010-10-22 ENCOUNTER — Other Ambulatory Visit: Payer: Self-pay | Admitting: Family Medicine

## 2010-10-22 DIAGNOSIS — Z09 Encounter for follow-up examination after completed treatment for conditions other than malignant neoplasm: Secondary | ICD-10-CM

## 2010-11-10 ENCOUNTER — Encounter: Payer: Self-pay | Admitting: Family Medicine

## 2010-11-23 ENCOUNTER — Ambulatory Visit
Admission: RE | Admit: 2010-11-23 | Discharge: 2010-11-23 | Disposition: A | Discharge status: Home / Self Care | Payer: MEDICARE | Source: Ambulatory Visit | Attending: Family Medicine | Admitting: Family Medicine

## 2010-11-23 DIAGNOSIS — Z09 Encounter for follow-up examination after completed treatment for conditions other than malignant neoplasm: Secondary | ICD-10-CM

## 2010-11-26 ENCOUNTER — Other Ambulatory Visit: Payer: Self-pay | Admitting: Family Medicine

## 2010-11-26 DIAGNOSIS — E119 Type 2 diabetes mellitus without complications: Secondary | ICD-10-CM

## 2010-11-26 DIAGNOSIS — E785 Hyperlipidemia, unspecified: Secondary | ICD-10-CM

## 2010-11-27 ENCOUNTER — Other Ambulatory Visit (INDEPENDENT_AMBULATORY_CARE_PROVIDER_SITE_OTHER): Payer: Medicare Other | Admitting: Family Medicine

## 2010-11-27 DIAGNOSIS — E119 Type 2 diabetes mellitus without complications: Secondary | ICD-10-CM

## 2010-11-27 DIAGNOSIS — E785 Hyperlipidemia, unspecified: Secondary | ICD-10-CM

## 2010-11-28 LAB — ALT: ALT: 18 U/L (ref 0–35)

## 2010-11-28 LAB — RENAL FUNCTION PANEL
Albumin: 4.5 g/dL (ref 3.5–5.2)
Calcium: 10 mg/dL (ref 8.4–10.5)
Glucose, Bld: 172 mg/dL — ABNORMAL HIGH (ref 70–99)
Sodium: 138 mEq/L (ref 135–145)

## 2010-11-28 LAB — LIPID PANEL
LDL Cholesterol: 54 mg/dL (ref 0–99)
VLDL: 47 mg/dL — ABNORMAL HIGH (ref 0–40)

## 2010-11-28 LAB — AST: AST: 26 U/L (ref 0–37)

## 2010-11-30 ENCOUNTER — Encounter: Payer: Self-pay | Admitting: Family Medicine

## 2010-11-30 ENCOUNTER — Ambulatory Visit (INDEPENDENT_AMBULATORY_CARE_PROVIDER_SITE_OTHER): Payer: Medicare Other | Admitting: Family Medicine

## 2010-11-30 DIAGNOSIS — I10 Essential (primary) hypertension: Secondary | ICD-10-CM

## 2010-11-30 DIAGNOSIS — E119 Type 2 diabetes mellitus without complications: Secondary | ICD-10-CM

## 2010-11-30 DIAGNOSIS — E875 Hyperkalemia: Secondary | ICD-10-CM

## 2010-11-30 MED ORDER — GLIPIZIDE ER 5 MG PO TB24: 5.0000 mg | ORAL_TABLET | Freq: Two times a day (BID) | ORAL | Status: DC

## 2010-11-30 NOTE — Cardiovascular Report (Signed)
Stephanie [name], Stephanie [name]                          ACCOUNT NO.:  [id number]   MEDICAL RECORD NO.:  [id number]                   PATIENT TYPE:  INP   LOCATION:  2033                                 FACILITY:  [facility]   PHYSICIAN:  Carole Binning, M.D. [facility]         DATE OF BIRTH:  12-11-1933   DATE OF PROCEDURE:  03/27/2004  DATE OF DISCHARGE:                              CARDIAC CATHETERIZATION   PROCEDURE PERFORMED:  Left heart catheterization with coronary angiography  and left ventriculography.   INDICATIONS FOR PROCEDURE:  Ms. Rodenbaugh is a 77 year old woman who is status  post drug eluding stent placed in the left anterior descending  one month  ago.  She presented to the hospital with symptoms of recurrent chest pain.  She did not have significant EKG changes and had negative cardiac enzymes.  Subsequently, gastroenterologic evaluation was unremarkable.  She had  recurrent episodes of chest pain in the hospital and thus we proceeded with  cardiac catheterization to rule out recurrent coronary artery disease.   PROCEDURE NOTE:  A 6 French sheath was placed in the right femoral artery.  Left coronary angiography was performed using a 6 [place] JL4 catheter.  The  right coronary artery is not injected as it is known to be a small  nondominant vessel from previous catheterization.  Left ventriculography was  performed with an angled pigtail catheter.  Contrast was Omnipaque.  There  were no complications.   RESULTS:  Hemodynamics:  Left ventricular  pressure 150/8, aortic pressure 158/62,  there was no aortic gradient.   Left ventriculogram:  Wall motion is normal.  Ejection fraction is estimated  at greater than or equal to 65%.  There is no mitral regurgitation.   Coronary angiography (left dominant):   Left main is normal.   Left anterior descending artery has a 30% stenosis in the ostium.  The  proximal LAD has a 20% stenosis.  There are two overlapping stents extending  from  the proximal to mid LAD.  These stents are widely patent with 0%  stenosis within the stent.  Within the distal portion of the stent, there is  an area of ulceration in the left anterior descending artery and a segment  of vessel that is covered by the stent.  This was not present on previous  catheterization.  However, this area of ulceration is completely covered by  the stent and there is no obstructive plaque at this site.  Distal to the  stent in the distal left anterior descending artery, there is a 30%  stenosis.  There is a normal size diagonal branch which arises from within  the stented segment of the left anterior descending  artery.  This had been  treated with balloon angioplasty before.  There is a 25% stenosis in the  ostium of this diagonal at the previous angioplasty site.  There is a small  series of small septal perforators which arise  from within the stented  segment of the left anterior descending artery. One of these septal  perforators has a 95% stenosis at its origin, however, again this is a very  small vessel.   Left circumflex is a large, dominant vessel.  There is a 50% stenosis in the  ostium of the circumflex.  In the mid circumflex, there is a tubular 30-40%  stenosis.  The left circumflex gives rise to a large first obtuse marginal,  normal size second and third obtuse marginal branches.  The distal  circumflex gives rise to a small posterolateral branch, normal size second  posterolateral branch, small third posterolateral branch, and a small  posterior descending artery.   Right coronary artery was not injected for reasons described above.   IMPRESSION:  1.  Normal left ventricular systolic function.  2.  Patent stents in the left anterior descending artery and patent diagonal      at the previous PTCA site.  Of note, there is a new short segment of      ulceration of the left anterior descending artery across which the stent      is placed, however,  this is completely covered by the length of the      stent.  There is disease in the small septal perforator which is      unchanged from a catheterization performed post stenting.  There is      moderate but nonobstructive disease in the left circumflex coronary      artery.   PLAN:  Continued medical therapy.   PLAN:  Angiomax will be discontinued.  Will continue Plavix for one month.  After one month, the Plavix can be discontinued.  However, the patient  should remain on aspirin following this including in the perioperative  period.  Would also recommend continued use of beta blocker in the  perioperative period.  If he remains stable, he should be cleared to have  his surgery approximately two weeks after stopping the Plavix.                                               Carole Binning, M.D. [facility]    MWP/MEDQ  D:  03/27/2004  T:  03/27/2004  Job:  [id number]   cc:   Marne A. Milinda Antis, M.D. [facility]

## 2010-11-30 NOTE — Assessment & Plan Note (Signed)
Difficult time getting am control Will add 2nd dose of glucotrol xl 5 in pm (keeping mind of low sugars)  Diet good Get back to exercise  Check bid for poorly controlled sugars  F/u aug as planned or needed earlier

## 2010-11-30 NOTE — Patient Instructions (Addendum)
Increase your glipizide to 5 mg twice daily- am and pm Alert me if low sugars Get back with the exercise program  Address blood pressure with Dr Thedore Mins as well  Follow up as already planned  Keep up the diabetic diet

## 2010-11-30 NOTE — Discharge Summary (Signed)
Stephanie [name], Stephanie [name] NO.:  [id number]   MEDICAL RECORD NO.:  [id number]                   PATIENT TYPE:  INP   LOCATION:  2033                                 FACILITY:  [facility]   PHYSICIAN:  Carole Binning, M.D. [facility]         DATE OF BIRTH:  1934-01-19   DATE OF ADMISSION:  03/22/2004  DATE OF DISCHARGE:  03/28/2004                                 DISCHARGE SUMMARY   BRIEF HISTORY:  This is a 77 year old female with know coronary artery  disease status post PTCA stenting of the proximal LAD as well as PTCA of the  diagonal 1 lesion performed in August 2005.  Prior to discharge from that  admission, the patient had recurrent pain and underwent repeat  catheterization performed by Dr. Riley Kill.  There was no restenosis noted at  time of this catheterization.  The plan at that time was to add Imdur to the  patient's medications and to follow up in the office of Dr. Gerri Spore.   The patient presented on March 22, 2004, with a burning sensation in her  chest.  She had been placed on Aciphex b.i.d.  She also noted a headache and  visual problems and her Imdur was discontinued.  Arrangements were  eventually made to readmit the patient to [facility] on March 22, 2004, for further evaluation of her chest burning.   PAST MEDICAL HISTORY:  1.  Please see history of coronary artery disease as noted above.  2.  History of hypertension.  3.  History of dyslipidemia.  4.  History of diabetes mellitus that is diet controlled.  5.  Status post cholecystectomy.  6.  Status post tonsillectomy.  7.  Status post tubal ligation.   ALLERGIES:  ERYTHROMYCIN.   SOCIAL HISTORY:  The patient lives in [place] with her husband.  She is  a retired Engineer, civil (consulting).  She has three sons.  She does not smoke or use alcohol.   FAMILY HISTORY:  Significant for coronary artery disease.  Mother died at  age 64.  Father had coronary disease and diabetes and died at age  63.   HOSPITAL COURSE:  As noted, the patient was admitted to [facility]  on March 22, 2004, for further evaluation of chest burning.   A GI consult was obtained on March 23, 2004, from Dr. Loreta Ave, to see if  possibly she had a GI etiology of her symptoms.  The patient does have known  gastroesophageal reflux disease and dyspepsia.  It was felt that a peptic  ulcer would need to be ruled out.  She had a remote history of ulcer in the  1980's.   An upper endoscopy was performed on Monday, March 26, 2004.  A biopsy  was also performed.  The patient was noted to have some antral gastritis.  However, this was, otherwise, a normal EGD.  A CT scan of the chest and  pelvis were performed that showed no significant findings other than some  diverticular disease.  The patient had a prominent venous structure in her  chest which was felt to be benign.   The patient also had elevated liver function tests during her stay.  However, these appeared to be improving.  She would follow up with Dr. Loreta Ave  on an outpatient basis for further evaluation.   The patient was seen by Internal Medicine during her stay for hyponatremia.  She was fluid restricted.  The etiology was felt to be SIADH, and this was  followed throughout her stay.  The patient was also noted to be anemic, and  this may require further evaluation on an outpatient basis.  However, upper  endoscopy showed no significant source of bleeding, and as noted, her  pelvic and chest CT's were unremarkable except for diverticular disease and  the abnormal venous structure as noted.   The patient continued to have burning chest pain.  She also had some  diarrhea.  She was found to be negative for C.difficile.  She subsequently  underwent cardiac catheterization on March 27, 2004, performed by Dr.  Gerri Spore.  At that time, her stents were noted to be patent.  Continue  medical therapy was recommended.  It was also  recommended that she be  restarted on Imdur 15 mg daily.  Arrangements were eventually made to  discharge the patient on March 28, 2004, in stable and improved  condition.   LABORATORY DATA:  A CBC on September 14, revealed hemoglobin 10.1,  hematocrit 28.4, WBC 5900, platelets 218,000.  On August 21, hemoglobin had  been 12.6, hematocrit 37.  On September 14, a chemistry profile revealed BUN  4, creatinine 0.7, potassium 3.8, sodium 131, glucose 87.  On September 9,  sodium had been as low as 123.  Cardiac enzymes were found to be negative.  The patient did have elevated liver function tests during her stay.  The  most recent panel performed on September 13, showed improvement.  However,  her SGOT was still elevated at 68.  SGPT was elevated at 62.  C.difficile  specimen was negative.  Sed rate was 3.  An a.m. cortisol level was 12.8,  within normal limits.  Serum osmolality was 266 which was low.  TSH was  0.788.  Hemoglobin A1C was 5.3.  ANA was negative.  Lipid profile revealed  cholesterol 119, triglycerides 87, HDL 42, LDL 60.  Please see full dictated  cardiac catheterization report per Dr. Gerri Spore.  CT scan of the chest was  performed on September 12.  This revealed no pulmonary emboli.  The lungs  were clear.  There was a prominent venous structure in the right posterior  mediastinum, unlikely to be of clinical significance.  A CT of the abdomen  showed no acute findings except for multiple sigmoid diverticula without  inflammatory changes.   DISCHARGE MEDICATIONS:  1.  Toprol XL 50 mg b.i.d.  2.  Norvasc 5 mg b.i.d.  3.  Coated aspirin 81 mg daily.  4.  Enalapril 10 mg b.i.d.  5.  Monopril was discontinued.  6.  Lipitor 10 mg each evening.  7.  Protonix 40 mg b.i.d.  8.  Xanax 0.5 mg b.i.d. p.r.n. #30 no refills.  9.  Plavix 75 mg daily.  10. Glycerine p.r.n. chest pain.  11. Imdur 15 mg daily.  12. Tylenol p.r.n. pain.  DISCHARGE INSTRUCTIONS:  The patient was  told to avoid any strenuous  activity  or driving for two days.  She was not to lift more than 10 pounds  for one week.  She was to be on low-salt, low-fat diet.  She was told to  call the office if she had any increased pain, swelling or bleeding from her  groin.  She was to limit her fluid intake.  She was to have liver function  tests as well as a basic metabolic panel on her next office visit.  She was  to see Dr. Gerri Spore September 26 at 11.45 a.m.  Dr. Kenna Gilbert office would  call her for a follow up appointment.  She was told to call Dr. Royden Purl  office for a follow up appointment.   PROBLEM LIST AT THE TIME OF DISCHARGE:  #1 - CHEST BURNING.  Negative  cardiac enzymes.   #2 - REPEAT CARDIAC CATHETERIZATION.  Performed March 27, 2004,  revealing no restenosis of the previous placed stent.   #4 - HISTORY OF CORONARY ARTERY DISEASE WITH LAD STENTS.  Placed in August,  as well as, a percutaneous intervention to the first diagonal.   #5 - HYPONATREMIA OF UNCERTAIN ETIOLOGY.   #6 - GASTROESOPHAGEAL REFLUX DISEASE AND GASTRITIS.  GI consult this  admission including upper endoscopy and CT of the pelvis.  Please see  results as noted above.   #7 - HYPERTENSION.   #8 - DIABETES MELLITUS.   #9 - CHEST CT NEGATIVE FOR PULMONARY EMBOLUS BUT PROMINENT VENOUS STRUCTURE  NOTED.  Felt to be benign.   #10 - NAUSEA, VOMITING, DIARRHEA, NEGATIVE FOR C.DIFFICILE.   #11 - ANEMIA OR UNCERTAIN ETIOLOGY.   #12 - MILDLY ELEVATED LIVER FUNCTION TESTS.  Improving.   #13 - MILDLY ELEVATED GLUCOSE LEVELS.   #14 - ELEVATED LIPASE.      Delton See, P.A. [facility]                  Carole Binning, M.D. [facility]    DR/MEDQ  D:  03/28/2004  T:  03/28/2004  Job:  [id number]   cc:   Anselmo Rod, M.D.  [address]  [address]  [place]  [place] [address]  Fax: [phone]   Audrie Gallus. Milinda Antis, M.D. [facility]

## 2010-11-30 NOTE — Consult Note (Signed)
NAMESTEPHAN, DRAUGHN                          ACCOUNT NO.:  [id number]   MEDICAL RECORD NO.:  [id number]                   PATIENT TYPE:  INP   LOCATION:  2033                                 FACILITY:  [facility]   PHYSICIAN:  Anselmo Rod, M.D.               DATE OF BIRTH:  1934/03/03   DATE OF CONSULTATION:  03/23/2004  DATE OF DISCHARGE:                                   CONSULTATION   REASON FOR CONSULTATION:  Chest pain with GERD and dyspepsia.   ASSESSMENT:  1.  Gastroesophageal reflux disease and dyspepsia, rule out peptic ulcer      disease:  The patient is on Aciphex 20 mg b.i.d. and has a remote      history of ulcer disease in the 1980s.  2.  History of Helicobacter pylori, positive serology:  Treat with      preparation back in October of 2004.  3.  Occasional bright red blood per rectum, questionable hemorrhoids:      Hemoglobin is stable on admission.  4.  Hyponatremia, reason unclear:  I strongly doubt this is secondary to      gastrointestinal loss.  5.  Hypertension.  6.  Hyperlipidemia.  7.  Coronary artery disease, status post cardiac catheterization three weeks      ago resulting in stenting x 2 to the left anterior descending artery and      angioplasty to the second diagonal.  Residual coronary artery disease      with 60% ostial right coronary artery stenosis.  Good left ventricular      function.  8.  Adult onset diabetes mellitus.  9.  Questionable splenic artery aneurysm on chest x-ray.  10. Degenerative joint disease.  11. Status post laparoscopic cholecystectomy for presumed biliary dyskinesia      found to be due to mild chronic cholecystitis in March of 2005.  12. Status post tonsillectomy.  13. History of three normal vaginal deliveries, status post bilateral tubal      ligation.   RECOMMENDATIONS:  1.  Trial of Carafate study 1 gm q.i.d.  2.  EGD on Monday.  3.  Keep aspirin at 81 mg (enteric-coated aspirin) if okay with the      cardiology  service.  4.  Recheck lipase in the morning.  Abdominal ultrasound to rule out biliary      ductal dictation in view of the fact that the transaminases are elevated      even though the alkaline phosphate is normal.  Monitor LFTs closely.   HISTORY OF PRESENT ILLNESS:  This is a 77 year old white female with the  above-medical problems who was admitted last night when she developed some  retrosternal chest discomfort and pain.  She was given Morphine en route to  the hospital which improved her symptoms.  She also described two episodes  of nausea with vomiting on two occasions after hospitalization.  She has had  ongoing retrosternal discomfort and burning but the symptoms changed to pain  yesterday.  She had some shortness of breath without diaphoresis.  Her  appetite has been decreased through all of this since her last admission  when she was hospitalized for chest pain and had the catheterization x 3 and  stenting of two vessels as mentioned above.   She was admitted on March 01, 2004 and discharged on March 05, 2004.  She  denies any fevers, chills, or rigors associated with her symptoms.  There is  no history of melena or hematochezia.  She has had some epigastric  discomfort and was given Protonix during her last hospitalization which  caused her to have some loose stools.  She describes two small volume loose  stools but denies any blood or mucus in the stool.   She was switched over to Aciphex by Dr. Idamae Schuller A. Tower and was advised to  take 20 mg b.i.d. after she saw her in follow-up.  This was after her last  hospitalization.  She said that this did not seem to help her symptoms much.  She had a similar episode during her last hospitalization and this had  helped relieve her retrosternal burning.  She denies any abnormal weight  loss or weight gain.  There is no history of dysphagia, odynophagia,  dyspepsia seems to be an ongoing problem.   In spite of her strict dietary  restrictions, her symptoms seem to continue.  Her chest pain has been under control since admission.   ALLERGIES:  ERYTHROMYCIN.   MEDICATIONS:  1.  Toprol XL 50 mg b.i.d.  2.  Norvasc 5 mg b.i.d.  3.  Monopril 10 mg p.o. q.d.  4.  Lipitor 10 mg p.o. q.d.  5.  Aspirin 81 mg q.d.  6.  Aciphex 20 mg b.i.d.  7.  Plavix has been on hold along with the isosorbide she mentioned which      was started on her last admission.   PAST MEDICAL HISTORY:  See list above.   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  She used to work for [employer].  She admits walking four to five miles every morning seven days a week.  She  drinks alcohol on social occasions.  She denies the use of tobacco or  illicit street drugs.   FAMILY HISTORY:  Her father died in his 84s of MI complicated by diabetes.  Her mother died at 15 of a MI.  She had history of hypertension, stroke, and  cervical cancer.  There is no known history of breast, ovarian, colon, or  uterine cancer.   PHYSICAL EXAMINATION:  GENERAL:  A pleasant elderly white female laying  comfortably in bed in no acute distress.  VITAL SIGNS:  Stable.  Blood pressure is 140/60, pulse is 70 per minute,  respiratory rate is 18, temperature 97.8.  HEENT:  Normocephalic and atraumatic.  PERRLA.  Oropharynx without exudate.  NECK:  Supple.  No JVD, thyromegaly, or lymphadenopathy.  CHEST:  Clear to auscultation.  No rales, rhonchi, or wheezing.  HEART:  S1 and S2.  No murmurs, rubs, or gallops.  ABDOMEN:  Soft with some epigastric tenderness on palpitation.  No rebound,  rigidity, no hepatosplenomegaly.  No other masses palpable.  Laparoscopic  cholecystectomy scar from the right upper quadrant.  RECTAL:  Decreased sphincter tone with guaiac negative stool.  No other  masses palpable.  LABORATORY DATA:  Laboratory evaluation on admission reveals a white count  of 8.1 thousand,  hemoglobin 12, hematocrit 33.4, platelets 264,000.  Sodium  127, potassium 4.3, chloride 97, CO2 32.4, BUN 9, glucose 103.  Labs today  revealed a lipase of 130, amylase 71, hemoglobin 11.4 with hematocrit 31.9,  platelets 236,000.  Sodium is down to 123 with potassium of 4.2, chloride  93, CO2 22, BUN 7, creatinine 0.6.  ESR/ANA, hemoglobin A1C, and TSH are  pending.   Chest x-ray shows borderline cardiomyopathy without congestive heart  failure.   PLAN:  As above.  Further recommendation will be made in follow-up.                                               Anselmo Rod, M.D.    JNM/MEDQ  D:  03/23/2004  T:  03/24/2004  Job:  [id number]   cc:   Marne A. Milinda Antis, M.D. [facility]   Carole Binning, M.D. [facility] [place], [place].A.

## 2010-11-30 NOTE — Assessment & Plan Note (Signed)
bp up a bit with change to cholthalidone Pt will disc with renal doctor upcoming Disc avoiding sodium

## 2010-11-30 NOTE — Cardiovascular Report (Signed)
NAME:  Stephanie [name], Stephanie [name]                          ACCOUNT NO.:  [id number]   MEDICAL RECORD NO.:  [id number]                   PATIENT TYPE:  INP   LOCATION:  6599                                 FACILITY:  [facility]   PHYSICIAN:  Learta Codding, M.D. [facility]             DATE OF BIRTH:  November 01, 1933   DATE OF PROCEDURE:  02/23/2004  DATE OF DISCHARGE:                              CARDIAC CATHETERIZATION   PROCEDURE PERFORMED:  1. Left heart catheterization with selective coronary angiography.  2. Ventriculography.   DIAGNOSIS:  Severe single vessel coronary artery disease involving the left  anterior descending  and bifurcation with the first diagonal.   INDICATIONS FOR PROCEDURE:  The patient is a 77 year old female with  multiple cardiac risk factors including hypertension, hyperlipidemia, diet  controlled type 2 diabetes mellitus, with no history of CAD.  She was  admitted with substernal chest pain.  She was referred for a diagnostic  catheterization to assess her coronary anatomy.   DESCRIPTION OF PROCEDURE:  After informed consent was obtained, the patient  was brought to the catheterization laboratory.  The right groin was  sterilely prepped and draped.  A 6 French arterial sheath was placed using  modified Seldinger technique.  A 6 [place] and JR4 catheters were used  for selective coronary angiography using manual injection.  A 6 French  angled pigtail catheter was used for ventriculography.  Left sided  hemodynamics were obtained.  At the termination of the procedure, the  catheters were left in place but the sheath was left in place in  anticipation of possible percutaneous coronary intervention later today.  There were no complications encountered.   HEMODYNAMICS:  Left ventricular pressure 165/7 mmHg, aortic pressure 165/62  mmHg.  There was no gradient across the aortic valve.   VENTRICULOGRAPHY:  Ejection fraction less than 70%, there were no segmental  wall motion  abnormalities, there was no mitral regurgitation.   SELECTIVE CORONARY ANGIOGRAPHY:  1. Left main coronary artery was a large caliber vessel with no evidence of     flow limiting disease.  2. Left anterior descending  artery was a large caliber vessel wrapping     around the apex.  The ostial LAD had an approximately 30% stenosis.     Following this, there was a lesion just proximal to the first diagonal     take off of approximately 50%.  Bifurcation disease involved the diagonal     with a stenosis of 99%.  The mid Lad had a focal 70-80% stenosis followed     by a long 50% stenosis, another 50% stenosis following the second     diagonal branch.  The distal vessel remained of large size.  3. The circumflex coronary artery was a large caliber vessel.  The first     obtuse marginal branch had a 20% ostial stenosis, mid circumflex had 20-     30% stenosis.  The remainder of the marginals and the additional     circumflex was free of flow limiting disease.  The circumflex was a     dominant vessel.  4. The right coronary artery was a small vessel and this was not selectively     engaged and there was a 50-60% ostial stenosis.   RECOMMENDATIONS:  Angiographic images will be reviewed with Dr. Dannielle Huh.  The  patient has essentially single vessel coronary artery disease with  bifurcation involving the first diagonal branch.  The anatomy appears to be  amenable to percutaneous coronary intervention and the patient is  tentatively scheduled for a PCI later today.                                               Learta Codding, M.D. [facility]    GED/MEDQ  D:  02/23/2004  T:  02/24/2004  Job:  [id number]   cc:   Marne A. Milinda Antis, M.D. [facility]   Jonelle Sidle, M.D. [facility]

## 2010-11-30 NOTE — Discharge Summary (Signed)
Stephanie [name], Stephanie [name]                          ACCOUNT NO.:  [id number]   MEDICAL RECORD NO.:  [id number]                   PATIENT TYPE:  INP   LOCATION:  4703                                 FACILITY:  [facility]   PHYSICIAN:  Jonelle Sidle, M.D. [facility]        DATE OF BIRTH:  [birth year]   DATE OF ADMISSION:  02/21/2004  DATE OF DISCHARGE:  02/28/2004                                 DISCHARGE SUMMARY   ADDENDUM TO DISCHARGE SUMMARY:  [name] had initially been scheduled for  discharge on February 25, 2004.  However, she had recurrent chest pain that  was relieved with sublingual nitroglycerin.  She was, therefore, held over  the weekend and had a re-look heart catheterization on Monday.   On Monday, she had a re-look heart catheterization by Dr. Riley Kill which  showed no restenosis in the overlapped stent to the LAD.  There was a small  septal perforator with a 90% stenosis.  There was nonobstructive residual  disease in the circumflex.  The plans were reviewed with Dr. Gerri Spore and  she had Imdur added to her medication regimen.   On February 28, 2004, she was ambulating without chest pain or shortness of  breath.  Dr. Gerri Spore evaluated her and felt that she was stable for  discharge and could follow up as an outpatient.  [name] was considered  stable for discharge on February 28, 2004, and has outpatient follow up  arranged.   DISCHARGE MEDICATIONS:  Addition of Imdur 30 mg p.o. daily.      Theodore Demark, P.A. [facility]                  Jonelle Sidle, M.D. [facility]    RB/MEDQ  D:  02/28/2004  T:  02/28/2004  Job:  [id number]

## 2010-11-30 NOTE — Cardiovascular Report (Signed)
NAMEKALANI, BARAY                          ACCOUNT NO.:  [id number]   MEDICAL RECORD NO.:  [id number]                   PATIENT TYPE:  INP   LOCATION:  4703                                 FACILITY:  [facility]   PHYSICIAN:  Carole Binning, M.D. [facility]         DATE OF BIRTH:  [birth year]   DATE OF PROCEDURE:  02/24/2004  DATE OF DISCHARGE:                              CARDIAC CATHETERIZATION   PROCEDURE:  1. Percutaneous transluminal coronary angioplasty placement of drug eluting     stents x2 in the mid left anterior descending artery.  2. Percutaneous transluminal coronary angioplasty of the ostium of the     second diagonal branch.   INDICATIONS FOR PROCEDURE:  Ms. Fuster is a 77 year old woman with  hypertension, hyperlipidemia and diet controlled diabetes. She presented to  the hospital with unstable angina. Cardiac catheterization performed  yesterday by Dr. Andee Lineman revealed complex bifurcational disease in the left  anterior descending artery and the second diagonal branch. The left anterior  descending artery had a long 50% followed by an 80% stenosis in the mid  vessel. The second diagonal branch had a 99% stenosis in the ostium and it  arose from the diseased portion of the mid left anterior descending. After  discussion with the patient has her family regarding the risks and benefits  and alternative therapy, we have opted to proceed with percutaneous coronary  intervention.   PROCEDURE:  A #6 French sheath was placed in the right femoral artery.  Heparin and Integrilin were administered per protocol. We used a #6 [place]  JL-4 guiding catheter. An Asahi soft coronary guidewire was advanced under  fluoroscopic guidance into the distal LAD. With the advanced a second Asahi  soft wire under fluoroscopic guidance into the second diagonal branch. We  performed PTCA of the ostium with a second diagonal branch with a 2.0 x 12  mm Voyager balloon. We performed two inflations to 8  atmospheres. This did  result in significant improvement in the vessel lumen. We then advanced a  2.5 x 20 mm Quantum balloon and positioned this in the distal portion of the  mid LAD and inflated it to 12 atmospheres. We then positioned it in the  proximal portion of the mid LAD and inflated it to 12 atmospheres. Following  this, we positioned a 2.5 x 24 mm Taxus drug eluding stent in the distal  portion of the mid LAD. The stent was deployed at 8 atmospheres. We did a  second balloon inflation with the stent delivery ballon to 8 atmospheres. We  then positioned a 2.75 x 12 mm Taxus stent in the proximal portion of the  mid LAD with the stent extending across the origin of the second diagonal  branch. We removed our wire from the second diagonal branch and then  deployed the stent at 14 atmospheres. Following this, we re-advanced our  Asahi wire through the newly placed stent, through the  side struts of the  stent, into the second diagonal branch. We then performed post-dilatation of  our stents. We used a 2.25 x 20 mm Quantum balloon in the distal portion of  the more distal stent and inflated this to 22 atmospheres. We then  positioned a 2.5 x 20 mm Quantum balloon in the mid portion of the distal  stent and inflated it to 18 atmospheres. We then advanced a 2.0 x 9 mm  Maverick balloon over our wire in the second diagonal branch and performed  two inflations to 8 atmospheres. Following this, we advanced a 3.0 x 12 mm  Quantum balloon and inflated this to 15 atmospheres in the very proximal  portion of the proximal stent in the mid LAD. We then advanced slightly in  the area of stent over-lapping and inflated it to 10 atmospheres. We then  performed one final inflation in our second diagonal branch with a 2.0 x 9  mm Maverick balloon inflated to 6 atmospheres. Intermittent doses of  intercoronary nitroglycerin were administered. Final angiographic images  were obtained revealing patency  of both the LAD and diagonal branch. There  is zero percent residual stenosis within the LAD and TIMI-3 flow. There is  less than 20% residual stenosis in the diagonal branch with TIMI-3 flow.   COMPLICATIONS:  None.   RESULTS:  Successful complex bifurcational percutaneous transluminal  coronary angioplasty with placement of two drug eluding stents in the mid  left anterior descending artery. In the left anterior descending artery,  about 50% followed by an 80% stenosis were reduced to zero percent residual  with TIMI-3 flow. The stenosis in the diagonal branch was reduced from 99%  stenosis to less than 20% residual of TIMI-3 flow.   PLAN:  Integrilin will be continued for 18 hours. The patient will be  treated with Plavix and recommended for 1 year.                                               Carole Binning, M.D. [facility]    MWP/MEDQ  D:  02/24/2004  T:  02/26/2004  Job:  [id number]   cc:   Marne A. Tower, M.D. [facility]   Cardiac Catheterization Laboratory

## 2010-11-30 NOTE — Op Note (Signed)
NAMESHUNNA, MIKAELIAN                          ACCOUNT NO.:  [id number]   MEDICAL RECORD NO.:  [id number]                   PATIENT TYPE:  INP   LOCATION:  2033                                 FACILITY:  [facility]   PHYSICIAN:  Anselmo Rod, M.D.               DATE OF BIRTH:  [birth year]   DATE OF PROCEDURE:  03/26/2004  DATE OF DISCHARGE:                                 OPERATIVE REPORT   PROCEDURE PERFORMED:  Esophagogastroduodenoscopy with antral biopsies.   ENDOSCOPIST:  Anselmo Rod, M.D.   INSTRUMENT USED:  Olympus video panendoscope.   INDICATION FOR PROCEDURE:  A 77 year old white female with a history of  chest pain and epigastric discomfort, rule out peptic ulcer disease,  esophagitis, gastritis, etc.   PREPROCEDURE PREPARATION:  Informed consent was procured from the patient  and the patient fasted for 8 hours prior to the procedure.   PREPROCEDURE PHYSICAL:  The patient had stable vital signs, neck supple,  chest clear to auscultation, S1/S2 regular, abdomen soft with normal bowel  sounds.   DESCRIPTION OF PROCEDURE:  The patient was placed in the left lateral  decubitus position and sedated with 40 mg of Demerol and 4 mg of Versed in  incremental doses.  Once the patient was adequately sedated and maintained  on low-flow oxygen and continuous cardiac monitoring, the Olympus video  panendoscope was advanced through the mouth, over the tongue and into the  esophagus and under direct vision the entire esophagus appeared normal with  no evidence of esophagitis, rings or strictures.  The scope was then  advanced into the stomach.  There was mild antral gastritis noted.  Biopsies  were done to rule out presence of H. pylori pathology.  No erosions, ulcers,  masses, or polyps were identified.  Retroflexion  of the cardia revealed no  abnormalities.  The proximal small bowel appeared normal.  The patient had a  wide-open pylorus, no abnormalities were noted.  There was no  obvious  obstruction.  The patient tolerated the procedure well without  complications.   IMPRESSION:  1.  Normal-appearing esophagus and proximal small bowel.  2.  Mild to moderate antral gastritis, biopsied for Helicobacter pylori.  3.  No ulcers, erosions, masses, or polyps seen.   RECOMMENDATIONS:  1.  Continue Aciphex.  2.  Carafate.  3.  Avoid nonsteroidals.  4.  Treat with antibiotics if H. pylori present on biopsies.  5.  CT scan of abdomen and pelvis today.  6.  Further recommendations will be made on followup.                                               Anselmo Rod, M.D.    JNM/MEDQ  D:  03/26/2004  T:  03/26/2004  Job:  [id number]   cc:   Micheline Maze, NP  [employer] [facility] [employer]   Carole Binning, M.D. [facility]

## 2010-11-30 NOTE — Progress Notes (Signed)
Subjective:    Patient ID: Stephanie [name], female    DOB: 20-Oct-1933, 77 y.o.   MRN: [id number]  HPI  Here for f/u of DM a1c is 7.3 down from 7.5 Last visit limited diet after gum surgery  Went on a cruise-- and tried to be careful-- and that was difficult  Did not eat between meals  No sugar added desserts  No inc portions   Is back to diabetic diet - with portion control   A lot of walking then  Not much since  Usually walks regularly- 1 hour -- 3 days per week   Dr Thedore Mins took her off amlodipine and put her on chlorthalidone  144/68 -- little higher than usual  Renal -- sees end of this month  No ha No sob/ edema or cp   K is 5.0- this is better on different HTN med  Past Medical History  Diagnosis Date  . Allergy     allergic rhinitis  . Anxiety   . Diabetes mellitus     type II  . Hyperlipidemia   . Hypertension   . Arthritis     osteoarthritis  . Osteopenia   . Peripheral vascular disease   . Degenerative disk disease     in neck  . Periodontal disease     History   Social History  . Marital Status: Married    Spouse Name: N/A    Number of Children: N/A  . Years of Education: N/A   Occupational History  . Not on file.   Social History Main Topics  . Smoking status: Never Smoker   . Smokeless tobacco: Not on file  . Alcohol Use:   . Drug Use:   . Sexually Active:    Other Topics Concern  . Not on file   Social History Narrative  . No narrative on file    Allergies  Allergen Reactions  . Ace Inhibitors     REACTION: generic, reaction not known  . Amlodipine Besy-Benazepril Hcl     REACTION: chest pain  . Clopidogrel Bisulfate     REACTION: GI  . Erythromycin     REACTION: GI upset     Review of Systems Review of Systems  Constitutional: Negative for fever, appetite change, fatigue and unexpected weight change.  Eyes: Negative for pain and visual disturbance.  Respiratory: Negative for cough and shortness of breath.     Cardiovascular: Negative.for cp or sob or edema    Gastrointestinal: Negative for nausea, diarrhea and constipation.  Genitourinary: Negative for urgency and frequency. no excessive thirst  Skin: Negative for pallor. Or rash  Neurological: Negative for weakness, light-headedness, numbness and headaches.  Hematological: Negative for adenopathy. Does not bruise/bleed easily.  Psychiatric/Behavioral: Negative for dysphoric mood. The patient is not nervous/anxious.          Objective:   Physical Exam  Constitutional: She appears well-developed and well-nourished. No distress.  HENT:  Head: Normocephalic and atraumatic.  Mouth/Throat: Oropharynx is clear and moist.  Eyes: Conjunctivae and EOM are normal. Pupils are equal, round, and reactive to light.  Neck: Normal range of motion. Neck supple. No JVD present. Carotid bruit is not present. No thyromegaly present.  Cardiovascular: Normal rate, regular rhythm and normal heart sounds.   Pulmonary/Chest: Effort normal and breath sounds normal. No respiratory distress. She has no wheezes.  Abdominal: Soft. Bowel sounds are normal. She exhibits no distension and no mass. There is no tenderness.  Musculoskeletal: Normal range of motion.  She exhibits no edema and no tenderness.  Lymphadenopathy:    She has no cervical adenopathy.  Neurological: She is alert. She has normal reflexes. Coordination normal.  Skin: Skin is warm and dry. No rash noted. No erythema. No pallor.  Psychiatric: She has a normal mood and affect.          Assessment & Plan:

## 2010-11-30 NOTE — H&P (Signed)
NAMEKENNY, Stephanie [name]                          ACCOUNT NO.:  [id number]   MEDICAL RECORD NO.:  [id number]                   PATIENT TYPE:  INP   LOCATION:  1823                                 FACILITY:  [facility]   PHYSICIAN:  Jonelle Sidle, M.D. [facility]        DATE OF BIRTH:  [birth year]   DATE OF ADMISSION:  02/21/2004  DATE OF DISCHARGE:                                HISTORY & PHYSICAL   PRIMARY CARE PHYSICIAN:  Dr. Audrie Gallus. Tower.   CHIEF COMPLAINT:  Chest discomfort.   HISTORY OF PRESENT ILLNESS:  Ms. Watkin is a very pleasant 77 year old woman  with a history of hyperlipidemia, hypertension, diet-controlled type 2  diabetes mellitus and no clear history of coronary artery disease or  myocardial infarction.  She and her husband are active, typically walking 4  miles a day without any specific exertional chest pain or shortness of  breath with activity.  She states that this morning at rest she developed a  tingling sensation in her hands, radiating up her arms, associated with  chest heaviness.  This lasted for several minutes and ultimately became  associated with nausea and heaviness in her arms.  She took sublingual  nitroglycerin with improvement in her symptoms and ultimately was  transported to the emergency department, where additional nitroglycerin  relieved her chest pain.  She has otherwise been in her usual state of  health except for some mild fatigue over the last week.  She has some  residual tingling in her hands at this point, but otherwise feels better.  Her electrocardiogram shows sinus bradycardia at 58 beats per minute with a  single premature ventricular complex and nonspecific ST changes.  She has  been compliant with her medicines, which have not changed appreciably over  the last several years.  She does not describe any recent cardiovascular  testing.   ALLERGIES:  ERYTHROMYCIN.   CURRENT MEDICATIONS:  1. Toprol-XL 50 mg p.o. b.i.d.  2. Norvasc 5 mg  p.o. b.i.d.  3. Monopril 10 mg p.o. daily.  4. Lipitor 10 mg p.o. nightly.  5. Aspirin 81 mg p.o. daily.   PAST MEDICAL HISTORY:  1. Hypertension.  2. Dyslipidemia.  3. Type 2 diabetes mellitus which is diet-controlled.  I do not have     information on hemoglobin A1c.  4. Status post cholecystectomy back in February of 2005.  At that time, the     patient had some symptoms of chest pain up in her upper chest and     shoulder area, but this is described as a being different from her     current presentation.  5. Status post tubal ligation.  6. Prior tonsillectomy.   SOCIAL HISTORY:  The patient lives in [place] with her husband of 41  years.  She is a retired Designer, multimedia.  She has 3 sons.  She  walks 4 miles a day with her husband and denies  any tobacco or alcohol use  history.   FAMILY HISTORY:  Family history is significant for premature cardiovascular  disease.  The patient's mother died of a myocardial infarction at age 10 and  her father died at age 48 with type 2 diabetes mellitus and coronary artery  disease.   REVIEW OF SYSTEMS:  Review of systems as described in history of present  illness, otherwise, negative.   EXAM:  VITAL SIGNS:  Temperature is 98.2 degrees, heart rate 59,  respirations 20, blood pressure 197/64, oxygen saturation is 98% on 2 L  nasal cannula.  GENERAL:  In general, this is a well-nourished woman seated in bed in no  acute distress.  NECK:  Examination of the neck reveals no elevated jugular venous pressure  or carotid bruits.  No thyromegaly is noted.  CARDIOVASCULAR:  Exam reveals a regular rate and rhythm without loud murmur  or S3 gallop.  LUNGS:  Lungs are clear to auscultation.  ABDOMEN:  Abdomen is soft without hepatomegaly or bruits.  EXTREMITIES:  Extremities show no pitting edema.  SKIN:  Skin is without ulcerative changes.  PERIPHERAL VASCULAR:  Peripheral pulses are 2+.   ACCESSORY CLINICAL DATA:  1. Portable  chest x-ray shows some shadowing in the right upper lobe area     with suggestion for a followup PA and lateral chest film to better define     the process.  2. Twelve-lead electrocardiogram shows sinus bradycardia with no acute     changes, as described.   LABORATORY DATA:  Initial point-of-care cardiac markers are negative.  WBC  is 7.9, hemoglobin 13.9, hematocrit 40.1, platelets 237,000.  Sodium 138,  potassium 4.8, BUN 14, creatinine 0.7, glucose 124.  INR is pending.   IMPRESSION:  1. Chest pain syndrome with arm discomfort at rest as described in a 70-year-     old woman with multiple cardiac risk factors.  Electrocardiogram is     nonspecific and initial point-of-care cardiac markers were negative,     however, there remains significant concern for unstable angina.  2. Hypertension.  3. Dyslipidemia.  4. Diet-controlled type 2 diabetes mellitus.   PLAN:  1. We will admit the patient and continue her cardiac markers.  We will     continue home medications and begin intravenous nitroglycerin as well as     heparin.  2. After discussing the risks and benefits with the patient, we will plan     diagnostic coronary angiography to clearly assess the coronary anatomy.     She agrees to proceed.  3. Patient does need a followup PA and lateral chest x-ray, given findings     on her portable film.  We can follow up on this when available.  4. Check fasting lipid profile.                                               Jonelle Sidle, M.D. [facility]   SGM/MEDQ  D:  02/21/2004  T:  02/21/2004  Job:  [id number]

## 2010-11-30 NOTE — Cardiovascular Report (Signed)
[name], [name]                          ACCOUNT NO.:  [id number]   MEDICAL RECORD NO.:  [id number]                   PATIENT TYPE:  INP   LOCATION:  4703                                 FACILITY:  [facility]   PHYSICIAN:  Arturo Morton. Riley Kill, M.D. 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 She had  cholecystectomy in February of this year. She underwent recent  catheterization following an episode of chest pain and was documented to  have a high-grade stenosis of the left anterior descending artery involving  the second diagonal. She underwent percutaneous intervention by Dr. Carole Binning, M.D. [facility] with overlapping Taxis stents in the LAD. The diagonal  itself had a dissection and was tacked up nicely with a balloon. Finally  angiographic result was good. She has had some recurrent episodes of  substernal chest pain in the interim. She was brought back to the  catheterization laboratory for further evaluation and treatment.   PROCEDURE:  Coronary arteriography of the left coronary artery.   DESCRIPTION OF PROCEDURE:  The patient was brought to the catheterization  lab and prepped and draped in the usual fashion. Through an anterior  puncture, the left femoral artery was easily entered. A #6 French sheath was  placed. Views of the left coronary artery were then obtained in multiple  angiographic projections. Dr. Gerri Spore was in the laboratory and we  reviewed the films together. There were no complications. She was given  Labetalol at the end of the procedure and taken to the holding area in  satisfactory clinical condition.   HEMODYNAMIC DATA:  Initial aortic pressure was 188/65.   ANGIOGRAPHIC DATA:  1. The left main coronary artery  was free of critical disease.  2. The left anterior descending artery courses to the apex. The LAD diagonal     complex truly represents the first diagonal branch. There are overlapping     Taxis stents. The LAD is smooth throughout the overlapping stents without     evidence of restenosis. The diagonal itself, as assessed by Dr. Gerri Spore     and I, appears to be about 40%. There does not appear to be high-grade     elastic recoil of the vessel, and it looks relatively smooth at the     actual angioplasty site. There is a small septal perforator, and this     septal perforator has 90% ostial stenosis which could be the source of     recurrent chest pain. The distal LAD is without critical narrowing.  3. The circumflex is a dominant vessel. In the multiple views, there appears     to be a fairly smooth 40-50% narrowing prior to the origin of the first  marginal. The first marginal is a fairly large caliber vessel. There is a     30-40% area of segmental narrowing in the mid-vessel with the origin of     two marginal branches. This perhaps looks slightly worse in the IO view     but it is foreshortened, and in the LAO caudal view it is laid out nicely     and it looks less severe. The remainder of the distal circumflex is     without critical disease.  4. The right coronary artery was not entered.   CONCLUSION:  1. Continue patency of the stent with mild stenosis of the diagonal branch     at the previous site of angioplasty.  2. High-grade septal perforating stenosis, small branch.  3. Moderate disease of the circumflex in a patient with diet-controlled     diabetes.   RECOMMENDATIONS:  1. Blood pressure control.  2. Increased nitrates in the form of isosorbide mononitrate to 60 mg daily.  3. Aggressive risk factor reduction with lipid reductions.                                               Arturo Morton. Riley Kill, M.D. [facility]    TDS/MEDQ  D:  02/27/2004  T:  02/27/2004  Job:   [id number]   cc:   Carole Binning, M.D. [facility]. Tower, M.D. [facility]   CV Laboratory

## 2010-11-30 NOTE — Discharge Summary (Signed)
Stephanie [name], REGNER                ACCOUNT NO.:  [id number]   MEDICAL RECORD NO.:  [id number]          PATIENT TYPE:  INP   LOCATION:  3704                         FACILITY:  [facility]   PHYSICIAN:  Charlies Constable, M.D. [facility] DATE OF BIRTH:  [birth year]   DATE OF ADMISSION:  05/04/2004  DATE OF DISCHARGE:  05/05/2004                           DISCHARGE SUMMARY - REFERRING   PROCEDURES:  None.   REASON FOR ADMISSION:  Please refer to the dictated note.   LABORATORY DATA:  Normal cardiac enzymes (two sets).  Sodium 133, potassium  3.7, glucose 137, BUN 13, creatinine 0.8.  Hemoglobin 11, hematocrit 35.   Admission CXR:  NAD.   HOSPITAL COURSE:  The patient was kept for overnight observation and  evaluation of chest pain.  This was felt to be noncardiac in etiology, given  that the patient presented following recent catheterization revealing patent  stent sites.  Moreover, a recent upper endoscopy, by. Dr. Elsie Amis, revealed  gastroenteritis.   Serial cardiac markers were normal (two sets).  Given this, no further  cardiac workup was recommended by Dr. Juanda Chance and the patient was cleared for  discharge.   Of note, the patient reported INTOLERANCE to METOPROLOL and had been on  Toprol 50 mg daily in the past.  This was changed at discharge.  The patient  was instructed to resume all previous home medications.   MEDICATIONS AT DISCHARGE:  1.  Toprol-XL 50 mg daily (new).  2.  Plavix 75 mg daily.  3.  Aciphex 20 mg b.i.d.  4.  Norvasc 2.5 mg daily.  5.  Enalapril 10 mg b.i.d.  6.  Imdur 15 mg daily.  7.  Aspirin 81 mg daily.  8.  Lipitor 10 mg daily.  9.  Carafate as directed.   INSTRUCTIONS:  1.  The patient was instructed to arrange follow up with her primary care      physician, Dr. Roxy Manns, in the following 1-2 weeks.  2.  The patient is instructed to follow up with Dr. Daisey Must, as      previously scheduled.   DISCHARGE DIAGNOSES:  1.  Atypical chest pain.      1.   Normal serial cardiac markers.  2.  Coronary artery disease.      1.  Status post recent coronary angiogram revealing patent stents.      2.  Status post stent proximal left anterior descending/percutaneous          transluminal coronary angioplasty diagonal August 2005.      3.  Preserved left ventricular function.  3.  Gastritis.  4.  Hypertension.  5.  Diabetes mellitus.       GS/MEDQ  D:  05/05/2004  T:  05/05/2004  Job:  [id number]   cc:   Marne A. Milinda Antis, M.D. [facility]

## 2010-11-30 NOTE — H&P (Signed)
NAMEMICHELLA, DETJEN                ACCOUNT NO.:  [id number]   MEDICAL RECORD NO.:  [id number]          PATIENT TYPE:  INP   LOCATION:  3704                         FACILITY:  [facility]   PHYSICIAN:  Charlies Constable, M.D. [facility] DATE OF BIRTH:  28-Dec-1933   DATE OF ADMISSION:  05/04/2004  DATE OF DISCHARGE:                                HISTORY & PHYSICAL   CHIEF COMPLAINT:  Chest pain.   CLINICAL HISTORY:  Ms. Gambill is 77 years old and has had stenting of the  proximal LAD and percutaneous transluminal coronary angioplasty of a  diagonal branch in August 2005 by Dr. Gerri Spore.  She had a repeat cath  prior to admission and was readmitted for another repeat cath which showed  the stent to be patent.  She subsequently had endoscopy by Dr. Loreta Ave that  showed gastroenteritis.   Last night, she developed substernal burning sensation and was seen by the  emergency room physician and we admitted her for observation.  She has had  no shortness of breath or diaphoresis.  She did indicate that she cut back  on her Carafate a week ago.   Her past medical history is significant for hypertension, hyperlipidemia,  diabetes, status post cholecystectomy and anxiety.   She is allergic to ERYTHROMYCIN.   CURRENT MEDICATIONS:  1.  Plavix 75 mg daily.  2.  Aciphex 20 mg b.i.d.  3.  Metoprolol 25 mg b.i.d.  4.  Norvasc 2.5 mg daily.  5.  Enalapril 10 mg b.i.d.  6.  Imdur 50 mg daily.  7.  Aspirin 81 mg daily.  8.  Lipitor 10 mg daily.  9.  Centrum and Carafate q.i.d.   SOCIAL HISTORY:  She lives in [place] with her husband, is retired  Engineer, civil (consulting).  She has three sons.  She does not smoke.   Family history is significant in that her mother died at age 33 of coronary  heart disease and her father had died at age 31 of coronary heart disease  and diabetes.   Review of systems is positive for bilateral arm pain.   PHYSICAL EXAMINATION:  VITAL SIGNS:  Blood pressure 148/67, pulse 67 and  regular.  HEENT:  There was no venous distention.  The carotid pulses were full  without bruits.  CHEST:  Clear without rales or rhonchi.  CARDIAC:  The heart rhythm was regular.  The heart sounds were normal.  No  murmurs or gallops.  ABDOMEN:  Soft without organomegaly.  Peripheral pulses were full.  There  was no peripheral edema.  SKIN:  Warm and dry.  MUSCULOSKELETAL:  No deformities.  NEUROLOGIC:  No focal or neurologic signs.   An ECG was normal.  First set of markers was negative.   IMPRESSION:  1.  Substernal burning chest pain, suspect reflux and doubt cardiac      etiology.  2.  Coronary artery disease status post stenting of the left anterior      descending and percutaneous transluminal coronary angioplasty of      diagonal branch August 2005.  3.  Good left ventricular function.  4.  Diabetes.  5.  Gastritis by endoscopy.  6.  Diabetes, diet controlled.  7.  Hypertension.   RECOMMENDATIONS:  Will plan to admit the patient for observation.  If her  markers are negative, will plan to discharge her with followup with Dr.  Ruthine Dose.  I do not think she will need further cardiac workup if her enzymes  are negative.       BB/MEDQ  D:  05/05/2004  T:  05/05/2004  Job:  [id number]   cc:   Angelena Sole, M.D. [facility]   Carole Binning, M.D. [facility]

## 2010-11-30 NOTE — Discharge Summary (Signed)
NAMEHENSLEY, Stephanie [name]                          ACCOUNT NO.:  [id number]   MEDICAL RECORD NO.:  [id number]                   PATIENT TYPE:  INP   LOCATION:  6522                                 FACILITY:  [facility]   PHYSICIAN:  Jonelle Sidle, M.D. [facility]        DATE OF BIRTH:  06/12/1934   DATE OF ADMISSION:  02/21/2004  DATE OF DISCHARGE:  02/25/2004                                 DISCHARGE SUMMARY   DISCHARGE DIAGNOSES:  1. Coronary artery disease.     A. Status post drug-eluting stenting times two to the left anterior        descending and angioplasty to the second diagonal.     B. Residual coronary artery disease with 50% to 60% ostial right coronary        artery stenosis.  2. Good left ventricular function.  3. Diabetes mellitus.  4. Hypertension.  5. Hyperlipidemia.  6. Mild normocytic anemia.  7. Hyponatremia.  8. Questionable splenic artery aneurysm on chest x-ray.  9. Small right groin hematoma.   PROCEDURE PERFORMED THIS ADMISSION:  1. Cardiac catheterization by Dr. Lewayne Bunting. Please see his dictated note     for complete details. Basically, this revealed a LAD and diagonal     bifurcation disease with some residual nonobstructive disease in the     circumflex, and 50% to 60% stenosis in the RCA ostially.  2. Percutaneous coronary intervention by Dr. Daisey Must on February 24, 2004, with drug eluting stenting with Taxus stents times two the mid LAD     and PTCA to the second diagonal with no immediate complications noted.   HOSPITAL COURSE:  Please see dictated admission history and physical for  complete details. Briefly, this is a 77 year old female who presented on  February 21, 2004, with complaints of chest discomfort. She ruled out for  myocardial infarction. It was felt that she should go for cardiac  catheterization. This was done on August 11th by Dr. Andee Lineman as noted above.  She was referred to Dr. Gerri Spore. He discussed the findings on  catheterization with the patient and her husband.  Dr. Gerri Spore felt that  PCI of the LAD was a reasonable approach with moderate risk of diagonal  occlusion and subsequent myocardial infarction. Risks and benefits were  discussed with the patient and she agreed to proceed with percutaneous  coronary intervention. This was done on February 24, 2004. She had no  complications. She had some discomfort the night of August 12th. This was  shortly after eating and was not similar to her previous angina. On the  morning of February 25, 2004, she was found to be in stable condition. Her  hemoglobin had dropped from 12.1 to 10.8, and her sodium was 128.  Her CK-MB  was still pending at the time of this time of this dictation.  Electrocardiogram revealed sinus bradycardia with a heart rate of 52, and no  changes. CBC and BMET were pending at the time of dictation. As long as her  sodium has recovered and her hemoglobin is stable she will be discharged  home later today.   LABORATORY DATA:  White count 9600, hemoglobin 10.8, hematocrit 30.8,  platelet count 226,000. Follow-up CBC reveals a hemoglobin of 12.1 and  hematocrit 35.1. Sodium 128, potassium 3.6, chloride 99, CO2 23, glucose  123, BUN 11, creatinine 0.8, calc 8.6.  Repeat BMET still pending. LFTs  normal. Cardiac enzymes negative. Total cholesterol 119, triglycerides 87,  HDL 42, LDL 60.   Chest x-ray revealed no acute disease, possible 2 cm splenic artery  aneurysm.   DISCHARGE MEDICATIONS:  1. Toprol XL 50 mg b.i.d.  2. Norvasc 5 mg b.i.d.  3. Enalapril 10 mg b.i.d.  4. Lipitor 10 mg q.h.s.  5. Aspirin 325 mg a day.  6. Plavix 75 mg a day for one year.  7. Nitroglycerin p.r.n. chest pain.   PAIN MANAGEMENT:  Tylenol as needed. Nitroglycerin for chest pain. She is to  call us if she has recurrent chest pain.   ACTIVITY:  No driving, heaving lifting, exertional work for three days.   DIET:  Low-fat, low-sodium, diabetic diet.    WOUND CARE:  The patient is to call our office for groin swelling, bleeding,  or bruising.   FOLLOWUP:  With Dr. Gerri Spore in two weeks. The office will contact her with  an appointment. She will need to see Dr. Milinda Antis as needed.      Tereso Newcomer, P.A.                        Jonelle Sidle, M.D. [facility]    SW/MEDQ  D:  02/25/2004  T:  02/25/2004  Job:  [id number]   cc:   Marne A. Milinda Antis, M.D. [facility]   Carole Binning, M.D. [facility]

## 2010-11-30 NOTE — Op Note (Signed)
Stephanie, [name]                          ACCOUNT NO.:  [id number]   MEDICAL RECORD NO.:  [id number]                   PATIENT TYPE:  OIB   LOCATION:  2899                                 FACILITY:  [facility]   PHYSICIAN:  Abigail Miyamoto, M.D.              DATE OF BIRTH:  July 27, 1933   DATE OF PROCEDURE:  09/12/2003  DATE OF DISCHARGE:                                 OPERATIVE REPORT   PREOPERATIVE DIAGNOSIS:  Biliary dyskinesia.   POSTOPERATIVE DIAGNOSIS:  Biliary dyskinesia.   OPERATION PERFORMED:  Laparoscopic cholecystectomy with intraoperative  cholangiogram.   SURGEON:  Douglas A. Magnus Ivan, M.D.   ANESTHESIA:  General endotracheal.   ASSISTANT:  Anselm Pancoast. Zachery Dakins, M.D.   ESTIMATED BLOOD LOSS:  Minimal.   OPERATIVE FINDINGS:  The patient was found to have a normal cholangiogram.   DESCRIPTION OF PROCEDURE:  The patient was brought to the operating room and  identified as Stephanie [name].  She was placed supine on the operating table  and general anesthesia was induced.  Her abdomen was then prepped and draped  in the usual sterile fashion.  Using a #15 blade, a small vertical incision  was made below the umbilicus.  The incision was carried down to the fascia  which was then opened with a scalpel.  Hemostat was used to pass into the  peritoneal cavity.  A 0 Vicryl pursestring suture was then placed around the  fascial opening.  The Hasson port was placed through the opening and  insufflation of the abdomen was begun.  A 12 mm port was then placed in the  patient's epigastric tube, 5 mm port was placed in the patient's right flank  under direct vision.  The gallbladder was then identified, grasped and  retracted above the liver bed.  Dissection was then carried out in the base  of the gallbladder.  Several dense adhesions to the base of the gallbladder  were taken down bluntly.  The cystic duct was then identified and a window  around it was created.  It could be  easily identified circumferentially  going into the bladder.  The cystic duct was then clipped distally.  It was  then partly opened with laparoscopic scissors.  An angiocatheter was then  inserted in the right upper quadrant under direct vision.  The  cholangiocatheter was passed through this and placed into the opening in the  cystic duct.  The cholangiogram was then performed under direct fluoroscopy.  Good flow of contrast was seen in the entire biliary system and duodenum  without evidence of abnormality or obstruction.  The angiocatheter was then  removed.  The cystic duct was then clipped three times proximally and  completely transected with scissors.  The cystic artery was identified and  clipped twice proximally and once distally and transected as well.  The  gallbladder was then easily dissected free from the liver bed with the  electrocautery.  Once the gallbladder was free from the liver bed, it was  removed through the incision at the umbilicus.  The 0 Vicryl in the  umbilicus was tied in place closing the fascial defect.  Again hemostasis  appeared to be achieved in the liver bed.  All ports were removed under  direct vision and the abdomen was deflated.  All incisions were anesthetized  with 0.25% Marcaine and closed with 4-0 Vicryl subcuticular sutures.  Steri-  Strips,  gauze and tape were then applied.  The patient tolerated the procedure well.  All sponge, needle and instrument counts were correct at the end of the  procedure.  The patient was then extubated in the operating room and taken  in stable condition to the recovery room.                                               Abigail Miyamoto, M.D.    DB/MEDQ  D:  09/12/2003  T:  09/12/2003  Job:  [id number]   cc:   Anselmo Rod, M.D.  [address]  [address]  [place]  [place] [address]  Fax: [phone]

## 2010-11-30 NOTE — H&P (Signed)
NAME:  Stephanie [name], Stephanie [name] NO.:  [id number]   MEDICAL RECORD NO.:  [id number]                   PATIENT TYPE:  INP   LOCATION:  1825                                 FACILITY:  [facility]   PHYSICIAN:  Pricilla Riffle, M.D.                 DATE OF BIRTH:  1934-02-27   DATE OF ADMISSION:  03/22/2004  DATE OF DISCHARGE:                                HISTORY & PHYSICAL   IDENTIFICATION:  Stephanie [name] is a 77 year old woman who comes into the  emergency room  for evaluation of chest pain.   HISTORY OF PRESENT ILLNESS:  The patient has a known history of coronary  artery disease.  She was hospitalized in August of this year for evaluation  of chest discomfort, underwent cardiac catheterization and found to have  disease in the LAD.  She underwent PTCA/stent x 2 (drug eluding) to the  proximal and mid LAD and also PTCA of a D1 lesion.  Prior to discharge, she  had recurrent pain and underwent a repeat catheterization by Dr. Riley Kill,  there was no restenosis in the overlapped stent to the LAD, there was a  small septal perforator with a 90% lesion, the diagonal had a 40% lesion.  The plan was the addition of Imdur and continued medical therapy.  In the  interval, the patient has been seen by Dr. Gerri Spore in clinic.  She has  described an additional burning sensation in addition to the chest  heaviness.  She has been placed on Aciphex b.i.d. when we saw her in the  clinic.  She was complaining also of headache and visual problems and Imdur  was discontinued.  The patient says with the Aciphex in the morning, her  burning is better, by noon it has come back some, by dinner it is still  there.  She also gets the pain not necessarily associated with activity.  The pain can come and go.  Yesterday, she was doing fairly good, she was  active during the day, taking her husband to his appointments.  Today, she  woke up feeling OK, got up and out of the house.  By noontime,  however, she  began having some chest burning.  She did not take a nitro because it gives  her bad headaches.  The pain came and went and she eventually presented to  the emergency room  here with EMS, she had been given some morphine with  transient relief of the pain.  Overall, since all this has started, she has  not been that active.  Again, burning is noted with some help from Aciphex.  She also notes that since this all started, she has had decreased appetite  and has had increased loose stools.  Again, after a cholecystectomy in  February, she did not have these complaints.   ALLERGIES:  Erythromycin.   PAST MEDICAL HISTORY:  1.  Coronary  artery disease.  2.  Hypertension.  3.  Dyslipidemia, records not available.  4.  History of type 2 diabetes diet controlled.   PAST SURGICAL HISTORY:  Status post cholecystectomy February 2005, status  post tonsillectomy, status post tubal ligation.   SOCIAL HISTORY:  The patient lives in [place] with her husband.  She is  a retired Engineer, civil (consulting).  She has three sons.  She does not smoke and does not  drink.   FAMILY HISTORY:  Significant for CAD in the mother who died at an MI at age  48, father had CAD, diabetes, and died at age 81.   REVIEW OF SYMPTOMS:  The patient notes occasional shortness of breath.  CARDIAC:  As noted, symptoms again with and without activity.  GI:  As  noted, the patient notes no GI complaints after cholecystectomy.  GU:  Negative.  ENDOCRINE:  As above.  MUSCULOSKELETAL:  As above.   PHYSICAL EXAMINATION:  GENERAL:  The patient is in no acute distress but complains of some mild  burning and chest pain.  VITAL SIGNS:  Blood pressure 160 systolic, pulse in the 50s to 16X,  temperature 98.  HEENT:  Slight facial rash.  NECK:  JVP normal, no bruits.  LUNGS:  Clear to auscultation.  CHEST:  Minimal tenderness.  CARDIAC:  Regular rate and rhythm, S1 and S2, no S3, S4, rub, or murmurs.  ABDOMEN:  Nontender.   EXTREMITIES:  2+ pulses, no edema.   LABORATORY DATA:  Hemoglobin 12 initially then 10.9 on repeat, WBC 8.1,  platelets 264,000.  BUN 9 and creatinine 0.6.  Sodium 127, potassium 4.3,  bicarb 24, chloride 97, glucose 103.  Initial CK x 2, point of care indices  are negative x 2 with a troponin x 2 of less than 0.05.  EKG shows sinus  bradycardia at a rate of 54 beats per minute.  No ST changes to suggest  ischemia.   IMPRESSION:  Stephanie [name] is a 69 year old woman status post PTCA/stent to the  LAD x 2, PTCA of the diagonal (August 2005) has not felt well since.  EKG  now that she presents with pain and burning is without acute changes,  initial markers are negative.   PLAN:  Will admit, rule out for MI, continue current regimen, begin heparin,  watch hemoglobin, check hemoccults.   Interestingly, the patient has decreased appetite and loose stool since all  this began, question if there is a GI component.  She had a cholecystectomy  back in February, question if it is related to this, question if it is some  sort of GI infection.  Will ask GI to see in the morning.  Hemoccult stool,  check C. diff toxin.  Continue Aciphex.   I have discussed this with the patient who agrees to proceed with the above  plan.  Will check urinalysis.  Will check ESR, ANA, amylase, lipase, CMP.                                                Pricilla Riffle, M.D.    PVR/MEDQ  D:  03/22/2004  T:  03/22/2004  Job:  [id number]

## 2010-12-02 NOTE — Assessment & Plan Note (Signed)
This seems to be imp with change on HTN med Will continue f/u with her renal doctor

## 2010-12-14 DIAGNOSIS — E2740 Unspecified adrenocortical insufficiency: Secondary | ICD-10-CM

## 2010-12-14 HISTORY — DX: Unspecified adrenocortical insufficiency: E27.40

## 2010-12-17 ENCOUNTER — Encounter: Payer: Self-pay | Admitting: Family Medicine

## 2011-02-07 ENCOUNTER — Telehealth: Payer: Self-pay | Admitting: Family Medicine

## 2011-02-07 DIAGNOSIS — I10 Essential (primary) hypertension: Secondary | ICD-10-CM

## 2011-02-07 DIAGNOSIS — E875 Hyperkalemia: Secondary | ICD-10-CM

## 2011-02-07 DIAGNOSIS — E871 Hypo-osmolality and hyponatremia: Secondary | ICD-10-CM

## 2011-02-07 DIAGNOSIS — E119 Type 2 diabetes mellitus without complications: Secondary | ICD-10-CM

## 2011-02-07 DIAGNOSIS — E785 Hyperlipidemia, unspecified: Secondary | ICD-10-CM

## 2011-02-07 DIAGNOSIS — M949 Disorder of cartilage, unspecified: Secondary | ICD-10-CM

## 2011-02-07 NOTE — Telephone Encounter (Signed)
Message copied by Judy Pimple on Thu Feb 07, 2011  9:40 PM ------      Message from: Stephanie [name]      Created: Tue Feb 05, 2011  9:36 AM      Regarding: cpx labs wed       Please order  future cpx labs for pt's upcomming lab appt.      Thanks      Rodney Booze

## 2011-02-13 ENCOUNTER — Other Ambulatory Visit (INDEPENDENT_AMBULATORY_CARE_PROVIDER_SITE_OTHER): Payer: Medicare Other | Admitting: Family Medicine

## 2011-02-13 DIAGNOSIS — E871 Hypo-osmolality and hyponatremia: Secondary | ICD-10-CM

## 2011-02-13 DIAGNOSIS — M899 Disorder of bone, unspecified: Secondary | ICD-10-CM

## 2011-02-13 DIAGNOSIS — E119 Type 2 diabetes mellitus without complications: Secondary | ICD-10-CM

## 2011-02-13 DIAGNOSIS — I10 Essential (primary) hypertension: Secondary | ICD-10-CM

## 2011-02-13 DIAGNOSIS — E875 Hyperkalemia: Secondary | ICD-10-CM

## 2011-02-13 DIAGNOSIS — E785 Hyperlipidemia, unspecified: Secondary | ICD-10-CM

## 2011-02-13 LAB — CBC WITH DIFFERENTIAL/PLATELET
Basophils Relative: 0.4 % (ref 0.0–3.0)
Eosinophils Absolute: 0.4 10*3/uL (ref 0.0–0.7)
Lymphocytes Relative: 42.6 % (ref 12.0–46.0)
MCHC: 33.4 g/dL (ref 30.0–36.0)
Neutrophils Relative %: 46.1 % (ref 43.0–77.0)
RBC: 4.16 Mil/uL (ref 3.87–5.11)
WBC: 10.3 10*3/uL (ref 4.5–10.5)

## 2011-02-13 LAB — HEMOGLOBIN A1C: Hgb A1c MFr Bld: 7.8 % — ABNORMAL HIGH (ref 4.6–6.5)

## 2011-02-13 LAB — LIPID PANEL
Cholesterol: 131 mg/dL (ref 0–200)
Total CHOL/HDL Ratio: 4

## 2011-02-14 LAB — COMPREHENSIVE METABOLIC PANEL
ALT: 17 U/L (ref 0–35)
AST: 26 U/L (ref 0–37)
Albumin: 4.4 g/dL (ref 3.5–5.2)
BUN: 32 mg/dL — ABNORMAL HIGH (ref 6–23)
Calcium: 9.4 mg/dL (ref 8.4–10.5)
Chloride: 101 mEq/L (ref 96–112)
Potassium: 4.9 mEq/L (ref 3.5–5.1)
Total Protein: 7.3 g/dL (ref 6.0–8.3)

## 2011-02-14 LAB — VITAMIN D 25 HYDROXY (VIT D DEFICIENCY, FRACTURES): Vit D, 25-Hydroxy: 37 ng/mL (ref 30–89)

## 2011-02-14 LAB — LDL CHOLESTEROL, DIRECT: Direct LDL: 66.1 mg/dL

## 2011-02-20 ENCOUNTER — Encounter: Payer: Self-pay | Admitting: Family Medicine

## 2011-02-20 ENCOUNTER — Ambulatory Visit (INDEPENDENT_AMBULATORY_CARE_PROVIDER_SITE_OTHER): Payer: Medicare Other | Admitting: Family Medicine

## 2011-02-20 ENCOUNTER — Other Ambulatory Visit (HOSPITAL_COMMUNITY)
Admission: RE | Admit: 2011-02-20 | Discharge: 2011-02-20 | Disposition: A | Discharge status: Home / Self Care | Payer: Medicare Other | Source: Ambulatory Visit | Attending: Family Medicine | Admitting: Family Medicine

## 2011-02-20 DIAGNOSIS — E119 Type 2 diabetes mellitus without complications: Secondary | ICD-10-CM

## 2011-02-20 DIAGNOSIS — Z1159 Encounter for screening for other viral diseases: Secondary | ICD-10-CM | POA: Insufficient documentation

## 2011-02-20 DIAGNOSIS — I10 Essential (primary) hypertension: Secondary | ICD-10-CM

## 2011-02-20 DIAGNOSIS — M899 Disorder of bone, unspecified: Secondary | ICD-10-CM

## 2011-02-20 DIAGNOSIS — E785 Hyperlipidemia, unspecified: Secondary | ICD-10-CM

## 2011-02-20 DIAGNOSIS — Z01419 Encounter for gynecological examination (general) (routine) without abnormal findings: Secondary | ICD-10-CM

## 2011-02-20 DIAGNOSIS — Z124 Encounter for screening for malignant neoplasm of cervix: Secondary | ICD-10-CM | POA: Insufficient documentation

## 2011-02-20 DIAGNOSIS — E1139 Type 2 diabetes mellitus with other diabetic ophthalmic complication: Secondary | ICD-10-CM

## 2011-02-20 MED ORDER — METFORMIN HCL 1000 MG PO TABS: 1000.0000 mg | ORAL_TABLET | Freq: Two times a day (BID) | ORAL | Status: DC

## 2011-02-20 MED ORDER — PAROXETINE HCL 40 MG PO TABS: 40.0000 mg | ORAL_TABLET | Freq: Every day | ORAL | Status: DC

## 2011-02-20 MED ORDER — METOPROLOL SUCCINATE ER 100 MG PO TB24: 100.0000 mg | ORAL_TABLET | Freq: Every day | ORAL | Status: DC

## 2011-02-20 MED ORDER — ATORVASTATIN CALCIUM 10 MG PO TABS: 10.0000 mg | ORAL_TABLET | Freq: Every day | ORAL | Status: DC

## 2011-02-20 MED ORDER — CHLORTHALIDONE 25 MG PO TABS: 25.0000 mg | ORAL_TABLET | Freq: Every day | ORAL | Status: DC

## 2011-02-20 NOTE — Assessment & Plan Note (Signed)
Per pt - stable exam in spring 2012 Will continue to follow

## 2011-02-20 NOTE — Progress Notes (Signed)
Subjective:    Patient ID: Stephanie [name], female    DOB: October 11, 1933, 77 y.o.   MRN: [id number]  HPI Here for check up of chronic med problems and also to rev health mt list  Overall doing fairly well   Has been having problems with her neck for about 6 weeks  Pain depends on her position  Pain goes into shoulders and in arms  Has hx of degenerative disk dz in neck by imaging in 2005 incidental  No meds except tylenol once in a while  Heat helps a little and the shower helps  No change in pillow    Also a little vertigo when she lies down for a moment  Unsteady when she gets upinitially- this comes and goes   Wt is stable   Colon screen  Is not interested in colonoscopy Will do IFOB for screening   Zoster status Has had shingles before - ? If interested   ptx99 - was at age 34 Td 68  Mam following R breast calcifications - April reassuring - will re check in oct She has not noticed any lumps   Gyn exam   opthy exam 10/11 stable - then again in the spring and will in the fall (goes twice per year )  Has DM retinopathy DM worse with a1c of 7.8 up from 7.5 Is on metformin 1000 bid  Also glucotrol bid   Eats multigrain cheerios and milk for bkfast Tomato sandwhich with 2 pc bread  Dinner - veg - some starchy and some green  ? protien at dinner    dexa- osteopenia 03? Has not had one in a while  Ca and D- is not taking it    HTN bp is 144/62  Lipids with trig up to 253 (? From sugar) Lab Results  Component Value Date   CHOL 131 02/13/2011   CHOL 135 11/27/2010   CHOL 112 02/13/2010   Lab Results  Component Value Date   HDL 36.60* 02/13/2011   HDL 34* 11/27/2010   HDL 28.50* 02/13/2010   Lab Results  Component Value Date   LDLCALC 54 11/27/2010   LDLCALC 51 02/13/2010   LDLCALC 50 03/08/2009   Lab Results  Component Value Date   TRIG 253.0* 02/13/2011   TRIG 235* 11/27/2010   TRIG 164.0* 02/13/2010   Lab Results  Component Value Date   CHOLHDL 4 02/13/2011   CHOLHDL 4.0 11/27/2010   CHOLHDL 4 02/13/2010   Lab Results  Component Value Date   LDLDIRECT 66.1 02/13/2011   LDLDIRECT 55.3 09/18/2009   LDLDIRECT 72.4 05/16/2008   LDL is good   No pap for a while Mother had vaginal carcinoma in situ   Patient Active Problem List  Diagnoses  . HELICOBACTER PYLORI INFECTION  . HSV  . DIABETES MELLITUS, TYPE II  . DIABETIC  RETINOPATHY  . HYPERLIPIDEMIA  . HYPERKALEMIA  . ANXIETY  . HYPERTENSION  . CAD  . PERIPHERAL VASCULAR DISEASE  . ALLERGIC RHINITIS  . OSTEOARTHRITIS  . OSTEOPENIA  . Gynecological examination   Past Medical History  Diagnosis Date  . Allergy     allergic rhinitis  . Anxiety   . Diabetes mellitus     type II  . Hyperlipidemia   . Hypertension   . Arthritis     osteoarthritis  . Osteopenia   . Peripheral vascular disease   . Degenerative disk disease     in neck  . Periodontal disease   .  Hypoaldosteronism 6/12    from DM causing hyperkalemia    Past Surgical History  Procedure Date  . Cholecystectomy   . Tubal ligation   . Tonsillectomy   . Esophagogastroduodenoscopy 03/2004    gastritis by biopsy  . Gum surgery 06/2010    laser surgery for gums   History  Substance Use Topics  . Smoking status: Never Smoker   . Smokeless tobacco: Not on file  . Alcohol Use:    Family History  Problem Relation Age of Onset  . Cancer Mother     vaginal CA in situ  . Hypertension Mother   . Heart disease Mother     CAD and MI  . Heart disease Father     CAD  . Diabetes Father   . Heart disease Son     congenital valve problem   Allergies  Allergen Reactions  . Ace Inhibitors     REACTION: generic, reaction not known  . Amlodipine Besy-Benazepril Hcl     REACTION: chest pain  . Clopidogrel Bisulfate     REACTION: GI  . Erythromycin     REACTION: GI upset   Current Outpatient Prescriptions on File Prior to Visit  Medication Sig Dispense Refill  . aspirin EC 81 MG EC tablet Take 81 mg by mouth daily.         . famotidine (PEPCID) 10 MG tablet OTC as directed.       Marland Kitchen glipiZIDE (GLUCOTROL) 5 MG 24 hr tablet Take 1 tablet (5 mg total) by mouth 2 (two) times daily.  60 tablet  11  . glucose blood test strip Check blood sugar once daily and as needed for DM 250.0       . Cinnamon 500 MG capsule Take 500 mg by mouth 3 (three) times daily.        Marland Kitchen PROBIOTIC CAPS Take by mouth as directed.            Review of Systems Review of Systems  Constitutional: Negative for fever, appetite change, fatigue and unexpected weight change.  Eyes: Negative for pain and visual disturbance.  Respiratory: Negative for cough and shortness of breath.   Cardiovascular: Negative.  for cp or sob or palpitations  Gastrointestinal: Negative for nausea, diarrhea and constipation.  Genitourinary: Negative for urgency and frequency.  Skin: Negative for pallor. or rash  Neurological: Negative for weakness, light-headedness, numbness and headaches.  Hematological: Negative for adenopathy. Does not bruise/bleed easily.  Psychiatric/Behavioral: Negative for dysphoric mood. The patient is not nervous/anxious.          Objective:   Physical Exam  Constitutional: She appears well-developed and well-nourished. No distress.  HENT:  Head: Normocephalic and atraumatic.  Right Ear: External ear normal.  Left Ear: External ear normal.  Nose: Nose normal.  Mouth/Throat: Oropharynx is clear and moist.  Eyes: Conjunctivae and EOM are normal. Pupils are equal, round, and reactive to light.  Neck: Normal range of motion. Neck supple. No JVD present. Carotid bruit is not present. No thyromegaly present.  Cardiovascular: Normal rate, regular rhythm, normal heart sounds and intact distal pulses.   Pulmonary/Chest: Effort normal and breath sounds normal. No respiratory distress. She has no wheezes. She exhibits no tenderness.  Abdominal: Soft. Bowel sounds are normal. She exhibits no distension and no mass. There is no tenderness.    Genitourinary: Vagina normal. No breast swelling, tenderness, discharge or bleeding. No vaginal discharge found.  Musculoskeletal: Normal range of motion. She exhibits no edema and no  tenderness.  Lymphadenopathy:    She has no cervical adenopathy.  Neurological: She is alert. She has normal reflexes. No cranial nerve deficit. Coordination normal.  Skin: Skin is warm and dry. No rash noted. No erythema. No pallor.  Psychiatric: She has a normal mood and affect.          Assessment & Plan:

## 2011-02-20 NOTE — Assessment & Plan Note (Signed)
Rev labs Fair control with statin and diet Rev low sat fat diet with pt  Trig will hopefully imp with better sugar

## 2011-02-20 NOTE — Assessment & Plan Note (Signed)
Pap and exam done  No c/o or abn seen Mother had hx of vaginal cancer

## 2011-02-20 NOTE — Assessment & Plan Note (Signed)
Over due for dexa  Pt is resistant to ca andD- we disc risks of fx without it  Plan dexa and schedule it

## 2011-02-20 NOTE — Assessment & Plan Note (Signed)
Stable and better on 2nd check today No change in meds  K is ok

## 2011-02-20 NOTE — Assessment & Plan Note (Signed)
Not opt controlled May need to add injectible med  Disc DM diet in length Rev labs with pt She was inst to check sugar tid after vacation and f/u 1 mo later May consider lantus or byetta

## 2011-02-20 NOTE — Patient Instructions (Signed)
Try a cervical support pillow made of foam-either use it alone or on top of your regular pillow If your neck pain continues - call to get appt with Dr Patsy Lager our sports medicine doctor Please do stool card for colon cancer screening  If you are interested in shingles vaccine in future - call your insurance company to see how coverage is and call us to schedule  We will schedule dexa at check out Try to get 1200-1500 mg of calcium per day with at least 1000 iu of vitamin D - for bone health  Please check sugar am fasting and 2 hours after aftenoon meal and again at bedtime (three times daily) Keep a log and follow up in 1 month with that log so we can decide what to do about diabetes Try to stick with a diabetic diet

## 2011-02-22 ENCOUNTER — Ambulatory Visit: Payer: MEDICARE | Admitting: Family Medicine

## 2011-03-04 ENCOUNTER — Encounter: Payer: Self-pay | Admitting: *Deleted

## 2011-03-07 ENCOUNTER — Other Ambulatory Visit: Payer: Self-pay | Admitting: Family Medicine

## 2011-03-07 ENCOUNTER — Other Ambulatory Visit: Payer: Medicare Other

## 2011-03-07 DIAGNOSIS — Z1211 Encounter for screening for malignant neoplasm of colon: Secondary | ICD-10-CM

## 2011-03-07 LAB — FECAL OCCULT BLOOD, IMMUNOCHEMICAL: Fecal Occult Bld: NEGATIVE

## 2011-03-12 ENCOUNTER — Encounter: Payer: Self-pay | Admitting: *Deleted

## 2011-04-29 ENCOUNTER — Other Ambulatory Visit: Payer: Self-pay | Admitting: Family Medicine

## 2011-04-29 DIAGNOSIS — R921 Mammographic calcification found on diagnostic imaging of breast: Secondary | ICD-10-CM

## 2011-05-08 LAB — HM DIABETES EYE EXAM: HM Diabetic Eye Exam: NORMAL

## 2011-05-22 ENCOUNTER — Ambulatory Visit (INDEPENDENT_AMBULATORY_CARE_PROVIDER_SITE_OTHER): Payer: Medicare Other

## 2011-05-22 DIAGNOSIS — Z23 Encounter for immunization: Secondary | ICD-10-CM

## 2011-05-27 ENCOUNTER — Ambulatory Visit
Admission: RE | Admit: 2011-05-27 | Discharge: 2011-05-27 | Disposition: A | Discharge status: Home / Self Care | Payer: Medicare Other | Source: Ambulatory Visit | Attending: Family Medicine | Admitting: Family Medicine

## 2011-05-27 ENCOUNTER — Other Ambulatory Visit: Payer: Self-pay | Admitting: Family Medicine

## 2011-05-27 DIAGNOSIS — M899 Disorder of bone, unspecified: Secondary | ICD-10-CM

## 2011-05-27 DIAGNOSIS — R921 Mammographic calcification found on diagnostic imaging of breast: Secondary | ICD-10-CM

## 2011-06-12 ENCOUNTER — Ambulatory Visit
Admission: RE | Admit: 2011-06-12 | Discharge: 2011-06-12 | Disposition: A | Discharge status: Home / Self Care | Payer: Medicare Other | Source: Ambulatory Visit | Attending: Family Medicine | Admitting: Family Medicine

## 2011-06-12 ENCOUNTER — Other Ambulatory Visit: Payer: Self-pay | Admitting: Family Medicine

## 2011-06-12 DIAGNOSIS — R921 Mammographic calcification found on diagnostic imaging of breast: Secondary | ICD-10-CM

## 2011-06-15 HISTORY — PX: MM BREAST STEREO BX*L*R/S: HXRAD495

## 2011-06-16 ENCOUNTER — Encounter: Payer: Self-pay | Admitting: Family Medicine

## 2011-06-21 ENCOUNTER — Encounter: Payer: Self-pay | Admitting: Family Medicine

## 2011-12-11 ENCOUNTER — Other Ambulatory Visit: Payer: Self-pay | Admitting: Family Medicine

## 2012-02-26 ENCOUNTER — Encounter: Payer: Self-pay | Admitting: Family Medicine

## 2012-02-26 ENCOUNTER — Ambulatory Visit (INDEPENDENT_AMBULATORY_CARE_PROVIDER_SITE_OTHER): Payer: Medicare Other | Admitting: Family Medicine

## 2012-02-26 VITALS — BP 126/68 | HR 61 | Temp 98.2°F | Ht 61.5 in | Wt 128.2 lb

## 2012-02-26 DIAGNOSIS — I10 Essential (primary) hypertension: Secondary | ICD-10-CM

## 2012-02-26 DIAGNOSIS — E119 Type 2 diabetes mellitus without complications: Secondary | ICD-10-CM

## 2012-02-26 DIAGNOSIS — E785 Hyperlipidemia, unspecified: Secondary | ICD-10-CM

## 2012-02-26 MED ORDER — GLUCOSE BLOOD VI STRP: ORAL_STRIP | Status: DC

## 2012-02-26 MED ORDER — PAROXETINE HCL 40 MG PO TABS: 40.0000 mg | ORAL_TABLET | Freq: Every day | ORAL | Status: DC

## 2012-02-26 MED ORDER — METFORMIN HCL 1000 MG PO TABS: 1000.0000 mg | ORAL_TABLET | Freq: Two times a day (BID) | ORAL | Status: DC

## 2012-02-26 MED ORDER — BLOOD GLUCOSE METER KIT: PACK | Status: DC

## 2012-02-26 MED ORDER — CHLORTHALIDONE 25 MG PO TABS: 25.0000 mg | ORAL_TABLET | Freq: Every day | ORAL | Status: DC

## 2012-02-26 MED ORDER — ATORVASTATIN CALCIUM 10 MG PO TABS: 10.0000 mg | ORAL_TABLET | Freq: Every day | ORAL | Status: DC

## 2012-02-26 MED ORDER — GLIPIZIDE ER 5 MG PO TB24: 5.0000 mg | ORAL_TABLET | Freq: Two times a day (BID) | ORAL | Status: DC

## 2012-02-26 MED ORDER — METOPROLOL SUCCINATE ER 100 MG PO TB24: 100.0000 mg | ORAL_TABLET | Freq: Every day | ORAL | Status: DC

## 2012-02-26 NOTE — Patient Instructions (Addendum)
Labs today Lay off the sweets Stay active  I'm glad you are feeling good  If you are interested in a shingles/zoster vaccine - call your insurance to check on coverage,( you should not get it within 1 month of other vaccines) , then call us for a prescription  for it to take to a pharmacy that gives the shot  Follow up with me in 3 months with glucose readings - check some ams and some pms

## 2012-02-26 NOTE — Progress Notes (Signed)
Subjective:    Patient ID: Stephanie [name], female    DOB: [birth year], 78 y.o.   MRN: [id number]  HPI Here for f/uof chronic conditions  Feels good  Very busy summer   Has to have a tooth extracted and going to put an implant in (has to replace one) Will be glad to get that over with  Has lots of oral problems- had laser tx on gums in the past   bp is 126/68     Today No cp or palpitations or headaches or edema  No side effects to medicines   BP Readings from Last 3 Encounters:  02/26/12 126/68  02/20/11 140/70  11/30/10 144/68   Is better   Wt is stable with bmi of 23  Diet-not behaving very well -- started eating sweets more (is careful but not all the time) Exercise- fairly   Diabetes Home sugar results --needs a new meter and strips  DM diet - fair- more sweets  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 HELICOBACTER PYLORI INFECTION  . HSV  . DIABETES MELLITUS, TYPE II  . DIABETIC  RETINOPATHY  . HYPERLIPIDEMIA  . HYPERKALEMIA  . ANXIETY  . HYPERTENSION  . CAD  . PERIPHERAL VASCULAR DISEASE  . ALLERGIC RHINITIS  . OSTEOARTHRITIS  . OSTEOPENIA  . Gynecological examination   Past Medical History  Diagnosis Date  . Allergy     allergic rhinitis  . Anxiety   . Diabetes mellitus     type II  . Hyperlipidemia   . Hypertension   . Arthritis     osteoarthritis  . Osteopenia   . Peripheral vascular disease   . Degenerative disk disease     in  neck  . Periodontal disease   . Hypoaldosteronism 6/12    from DM causing hyperkalemia    Past Surgical History  Procedure Date  . Cholecystectomy   . Tubal ligation   . Tonsillectomy   . Esophagogastroduodenoscopy 03/2004    gastritis by biopsy  . Gum surgery 06/2010    laser surgery for gums  . Mm breast stereo bx*l*r/s 12/12    normal   History  Substance Use Topics  . Smoking status: Never Smoker   . Smokeless tobacco: Not on file  . Alcohol Use: No   Family History  Problem Relation Age of Onset  . Cancer Mother     vaginal CA in situ  . Hypertension Mother   . Heart disease Mother     CAD and MI  . Heart disease Father     CAD  . Diabetes Father   . Heart disease Son     congenital valve problem   Allergies  Allergen Reactions  . Ace Inhibitors     REACTION: generic, reaction not known  . Amlodipine Besy-Benazepril Hcl     REACTION: chest pain  .  Clopidogrel Bisulfate     REACTION: GI  . Erythromycin     REACTION: GI upset   Current Outpatient Prescriptions on File Prior to Visit  Medication Sig Dispense Refill  . aspirin EC 81 MG EC tablet Take 81 mg by mouth daily.        Marland Kitchen atorvastatin (LIPITOR) 10 MG tablet Take 1 tablet (10 mg total) by mouth at bedtime.  30 tablet  11  . chlorthalidone (HYGROTON) 25 MG tablet Take 1 tablet (25 mg total) by mouth daily.  30 tablet  11  . diphenhydramine-acetaminophen (TYLENOL PM EXTRA STRENGTH) 25-500 MG TABS Take 1 tablet by mouth at bedtime as needed.        . famotidine (PEPCID) 10 MG tablet OTC as directed.       Marland Kitchen glipiZIDE (GLUCOTROL XL) 5 MG 24 hr tablet TAKE ONE TABLET BY MOUTH TWICE DAILY  60 tablet  3  . glucose blood test strip Check blood sugar once daily and as needed for DM 250.0       . metFORMIN (GLUCOPHAGE) 1000 MG tablet Take 1 tablet (1,000 mg total) by mouth 2 (two) times daily with a meal.  60 tablet  11  . metoprolol (TOPROL-XL) 100 MG 24 hr tablet Take 1 tablet (100 mg total) by mouth daily.   30 tablet  11  . PARoxetine (PAXIL) 40 MG tablet Take 1 tablet (40 mg total) by mouth daily.  30 tablet  11  . Cinnamon 500 MG capsule Take 500 mg by mouth 3 (three) times daily.        Marland Kitchen PROBIOTIC CAPS Take by mouth as directed.           Review of Systems Review of Systems  Constitutional: Negative for fever, appetite change, fatigue and unexpected weight change.  Eyes: Negative for pain and visual disturbance.  Respiratory: Negative for cough and shortness of breath.   Cardiovascular: Negative for cp or palpitations    Gastrointestinal: Negative for nausea, diarrhea and constipation.  Genitourinary: Negative for urgency and frequency.  Skin: Negative for pallor or rash   Neurological: Negative for weakness, light-headedness, numbness and headaches.  Hematological: Negative for adenopathy. Does not bruise/bleed easily.  Psychiatric/Behavioral: Negative for dysphoric mood. The patient is not nervous/anxious.         Objective:   Physical Exam  Constitutional: She appears well-developed and well-nourished. No distress.  HENT:  Head: Normocephalic and atraumatic.  Eyes: Conjunctivae and EOM are normal. Pupils are equal, round, and reactive to light.  Neck: Normal range of motion. Neck supple. No JVD present. Carotid bruit is not present. No thyromegaly present.  Cardiovascular: Normal rate, regular rhythm, normal heart sounds and intact distal pulses.  Exam reveals no gallop.   Pulmonary/Chest: Effort normal and breath sounds normal. No respiratory distress. She has no wheezes.  Abdominal: Soft. Bowel sounds are normal. She exhibits no distension, no abdominal bruit and no mass. There is no tenderness.  Musculoskeletal: She exhibits no edema.  Lymphadenopathy:    She has no cervical adenopathy.  Neurological: She is alert. She has normal reflexes. No cranial nerve deficit. She exhibits normal muscle tone. Coordination normal.  Skin: Skin is warm and dry. No rash noted. No erythema.  No pallor.  Psychiatric: She has a normal mood and affect.          Assessment & Plan:

## 2012-02-27 ENCOUNTER — Telehealth: Payer: Self-pay

## 2012-02-27 LAB — CBC WITH DIFFERENTIAL/PLATELET
Basophils Absolute: 0.1 10*3/uL (ref 0.0–0.1)
Basophils Relative: 0.6 % (ref 0.0–3.0)
Eosinophils Relative: 3.7 % (ref 0.0–5.0)
HCT: 39.1 % (ref 36.0–46.0)
Hemoglobin: 12.8 g/dL (ref 12.0–15.0)
Lymphocytes Relative: 39.7 % (ref 12.0–46.0)
Lymphs Abs: 4.4 10*3/uL — ABNORMAL HIGH (ref 0.7–4.0)
Monocytes Relative: 7.5 % (ref 3.0–12.0)
Neutro Abs: 5.3 10*3/uL (ref 1.4–7.7)
RBC: 4.26 Mil/uL (ref 3.87–5.11)
RDW: 12.8 % (ref 11.5–14.6)
WBC: 11 10*3/uL — ABNORMAL HIGH (ref 4.5–10.5)

## 2012-02-27 LAB — COMPREHENSIVE METABOLIC PANEL
AST: 42 U/L — ABNORMAL HIGH (ref 0–37)
Albumin: 4.3 g/dL (ref 3.5–5.2)
Alkaline Phosphatase: 58 U/L (ref 39–117)
BUN: 28 mg/dL — ABNORMAL HIGH (ref 6–23)
Chloride: 99 mEq/L (ref 96–112)
Total Protein: 7.7 g/dL (ref 6.0–8.3)

## 2012-02-27 LAB — LIPID PANEL
Cholesterol: 135 mg/dL (ref 0–200)
HDL: 37.4 mg/dL — ABNORMAL LOW (ref >39.00)
Total CHOL/HDL Ratio: 4
VLDL: 58.4 mg/dL — ABNORMAL HIGH (ref 0.0–40.0)

## 2012-02-27 NOTE — Telephone Encounter (Signed)
[facility] need the type of meter and exact directions to process on the insurance. Also DX code. Form is in you box.

## 2012-02-27 NOTE — Assessment & Plan Note (Signed)
Labs today  Suspect control is no optimal Rev low glycemic diet  F/u 3 mo with glucose readings

## 2012-02-27 NOTE — Telephone Encounter (Signed)
I told pt to have her ask the pharmacist what kind they recommend that medicare would cover for DM2 because I don't know Have her call back with that info and send this back to me and I will do paperwork when I return tomorrow

## 2012-02-27 NOTE — Assessment & Plan Note (Signed)
Lab today On lipitor and diet  Disc goals for cholesterol and risks of poor control

## 2012-02-27 NOTE — Assessment & Plan Note (Signed)
bp in fair control at this time  No changes needed  Disc lifstyle change with low sodium diet and exercise   Labs today F/u 3 mo

## 2012-02-28 ENCOUNTER — Telehealth: Payer: Self-pay | Admitting: Family Medicine

## 2012-02-28 MED ORDER — BLOOD GLUCOSE METER KIT: PACK | Status: AC

## 2012-02-28 MED ORDER — GLUCOSE BLOOD VI STRP: ORAL_STRIP | Status: DC

## 2012-02-28 NOTE — Telephone Encounter (Signed)
Spoke to pharmacist. The brands that medicare will cover are One Touch Ultra, Accucheck, Free Style. She stated that the name of the meter and test strips needs to be on the form and the directions.

## 2012-02-28 NOTE — Telephone Encounter (Signed)
Needs more specific info on gluc meter

## 2012-02-28 NOTE — Telephone Encounter (Signed)
Rx faxed to [facility] [phone]

## 2012-03-05 ENCOUNTER — Telehealth: Payer: Self-pay

## 2012-03-24 NOTE — Telephone Encounter (Signed)
Error

## 2012-04-22 ENCOUNTER — Ambulatory Visit (INDEPENDENT_AMBULATORY_CARE_PROVIDER_SITE_OTHER): Payer: Medicare Other

## 2012-04-22 DIAGNOSIS — Z23 Encounter for immunization: Secondary | ICD-10-CM

## 2012-06-16 LAB — HM DIABETES EYE EXAM

## 2012-06-22 ENCOUNTER — Encounter: Payer: Self-pay | Admitting: Family Medicine

## 2012-08-14 ENCOUNTER — Other Ambulatory Visit: Payer: Self-pay | Admitting: Family Medicine

## 2012-10-06 ENCOUNTER — Other Ambulatory Visit: Payer: Self-pay | Admitting: *Deleted

## 2012-10-06 MED ORDER — METFORMIN HCL 1000 MG PO TABS: 1000.0000 mg | ORAL_TABLET | Freq: Two times a day (BID) | ORAL | Status: DC

## 2012-10-06 MED ORDER — ATORVASTATIN CALCIUM 10 MG PO TABS: 10.0000 mg | ORAL_TABLET | Freq: Every day | ORAL | Status: DC

## 2012-12-23 ENCOUNTER — Other Ambulatory Visit: Payer: Self-pay | Admitting: *Deleted

## 2012-12-23 MED ORDER — GLIPIZIDE ER 5 MG PO TB24: 5.0000 mg | ORAL_TABLET | Freq: Two times a day (BID) | ORAL | Status: DC

## 2013-02-19 ENCOUNTER — Ambulatory Visit (INDEPENDENT_AMBULATORY_CARE_PROVIDER_SITE_OTHER): Payer: Medicare Other | Admitting: Family Medicine

## 2013-02-19 ENCOUNTER — Encounter: Payer: Self-pay | Admitting: Family Medicine

## 2013-02-19 VITALS — BP 130/70 | HR 60 | Temp 98.5°F | Ht 61.5 in | Wt 124.5 lb

## 2013-02-19 DIAGNOSIS — E785 Hyperlipidemia, unspecified: Secondary | ICD-10-CM

## 2013-02-19 DIAGNOSIS — E119 Type 2 diabetes mellitus without complications: Secondary | ICD-10-CM

## 2013-02-19 DIAGNOSIS — I10 Essential (primary) hypertension: Secondary | ICD-10-CM

## 2013-02-19 DIAGNOSIS — R609 Edema, unspecified: Secondary | ICD-10-CM

## 2013-02-19 LAB — CBC WITH DIFFERENTIAL/PLATELET
Basophils Absolute: 0 10*3/uL (ref 0.0–0.1)
Eosinophils Absolute: 0.4 10*3/uL (ref 0.0–0.7)
HCT: 39.1 % (ref 36.0–46.0)
Lymphs Abs: 4.2 10*3/uL — ABNORMAL HIGH (ref 0.7–4.0)
MCHC: 34 g/dL (ref 30.0–36.0)
Monocytes Absolute: 0.5 10*3/uL (ref 0.1–1.0)
Monocytes Relative: 4.9 % (ref 3.0–12.0)
Platelets: 223 10*3/uL (ref 150.0–400.0)
RDW: 12.6 % (ref 11.5–14.6)

## 2013-02-19 LAB — LIPID PANEL
HDL: 32.3 mg/dL — ABNORMAL LOW (ref >39.00)
Triglycerides: 300 mg/dL — ABNORMAL HIGH (ref 0.0–149.0)
VLDL: 60 mg/dL — ABNORMAL HIGH (ref 0.0–40.0)

## 2013-02-19 LAB — HEMOGLOBIN A1C: Hgb A1c MFr Bld: 10 % — ABNORMAL HIGH (ref 4.6–6.5)

## 2013-02-19 LAB — COMPREHENSIVE METABOLIC PANEL
ALT: 19 U/L (ref 0–35)
AST: 32 U/L (ref 0–37)
Alkaline Phosphatase: 56 U/L (ref 39–117)
CO2: 24 mEq/L (ref 19–32)
Creatinine, Ser: 1 mg/dL (ref 0.4–1.2)
Sodium: 132 mEq/L — ABNORMAL LOW (ref 135–145)
Total Bilirubin: 0.6 mg/dL (ref 0.3–1.2)
Total Protein: 7.3 g/dL (ref 6.0–8.3)

## 2013-02-19 LAB — MICROALBUMIN / CREATININE URINE RATIO: Microalb, Ur: 2.2 mg/dL — ABNORMAL HIGH (ref 0.0–1.9)

## 2013-02-19 MED ORDER — METFORMIN HCL 1000 MG PO TABS: 1000.0000 mg | ORAL_TABLET | Freq: Two times a day (BID) | ORAL | Status: DC

## 2013-02-19 MED ORDER — ATORVASTATIN CALCIUM 10 MG PO TABS: 10.0000 mg | ORAL_TABLET | Freq: Every day | ORAL | Status: DC

## 2013-02-19 MED ORDER — METOPROLOL SUCCINATE ER 100 MG PO TB24: 100.0000 mg | ORAL_TABLET | Freq: Every day | ORAL | Status: DC

## 2013-02-19 MED ORDER — GLIPIZIDE ER 5 MG PO TB24: 5.0000 mg | ORAL_TABLET | Freq: Two times a day (BID) | ORAL | Status: DC

## 2013-02-19 MED ORDER — PAROXETINE HCL 40 MG PO TABS: 40.0000 mg | ORAL_TABLET | Freq: Every day | ORAL | Status: DC

## 2013-02-19 MED ORDER — CHLORTHALIDONE 25 MG PO TABS: 25.0000 mg | ORAL_TABLET | Freq: Every day | ORAL | Status: DC

## 2013-02-19 NOTE — Patient Instructions (Addendum)
Take care of yourself Try to follow a diabetic diet and stay active Lab today Medicines were refilled today

## 2013-02-19 NOTE — Progress Notes (Signed)
Subjective:    Patient ID: Stephanie [name], female    DOB: Mar 22, 1934, 79 y.o.   MRN: [id number]  HPI Here for f/u of chronic health conditions  Doing well overall   Wt is down 4 lb with bmi of 23 Nothing different -diet or exercise   bp is up today - she took her medicine - she is nervous today No cp or palpitations or headaches or edema  No side effects to medicines  BP Readings from Last 3 Encounters:  02/19/13 152/70  02/26/12 126/68  02/20/11 140/70    Pt has hx of CAD When she checks bp at home -- usually 110/70 or a little higher   Hyperlipidemia  On lipitor and diet Lab Results  Component Value Date   CHOL 135 02/26/2012   HDL 37.40* 02/26/2012   LDLCALC 54 11/27/2010   LDLDIRECT 66.1 02/26/2012   TRIG 292.0* 02/26/2012   CHOLHDL 4 02/26/2012    Due for labs  Diabetes Home sugar results -has not been checking her sugars - but she feels high DM diet - eating a lot of fresh vegetables -occ candy  Exercise - regularly , and also deep cleaning the house lately Symptoms none  A1C last  Lab Results  Component Value Date   HGBA1C 8.4* 02/26/2012  she was then lost to f/u Was having a lot of dental problems at the time - then had some teeth pulled (now has an implant also)  No problems with medications  Renal protection pt is allergic to ace She would not consider insulin  Last eye exam 12/13- has retinop She is not very concerned about diabetes     Patient Active Problem List   Diagnosis Date Noted  . Gynecological examination 02/20/2011  . HYPERKALEMIA 03/06/2010  . HELICOBACTER PYLORI INFECTION 12/19/2006  . HSV 12/19/2006  . DIABETES MELLITUS, TYPE II 12/19/2006  . DIABETIC  RETINOPATHY 12/19/2006  . HYPERLIPIDEMIA 12/19/2006  . ANXIETY 12/19/2006  . HYPERTENSION 12/19/2006  . CAD 12/19/2006  . PERIPHERAL VASCULAR DISEASE 12/19/2006  . ALLERGIC RHINITIS 12/19/2006  . OSTEOARTHRITIS 12/19/2006  . OSTEOPENIA 12/19/2006   Past Medical History   Diagnosis Date  . Allergy     allergic rhinitis  . Anxiety   . Diabetes mellitus     type II  . Hyperlipidemia   . Hypertension   . Arthritis     osteoarthritis  . Osteopenia   . Peripheral vascular disease   . Degenerative disk disease     in neck  . Periodontal disease   . Hypoaldosteronism 6/12    from DM causing hyperkalemia    Past Surgical History  Procedure Laterality Date  . Cholecystectomy    . Tubal ligation    . Tonsillectomy    . Esophagogastroduodenoscopy  03/2004    gastritis by biopsy  . Gum surgery  06/2010    laser surgery for gums  . Mm breast stereo bx*l*r/s  12/12    normal   History  Substance Use Topics  . Smoking status: Never Smoker   . Smokeless tobacco: Not on file  . Alcohol Use: No   Family History  Problem Relation Age of Onset  . Cancer Mother     vaginal CA in situ  . Hypertension Mother   . Heart disease Mother     CAD and MI  . Heart disease Father     CAD  . Diabetes Father   . Heart disease Son     congenital  valve problem   Allergies  Allergen Reactions  . Ace Inhibitors     REACTION: generic, reaction not known  . Amlodipine Besy-Benazepril Hcl     REACTION: chest pain  . Clopidogrel Bisulfate     REACTION: GI  . Erythromycin     REACTION: GI upset   Current Outpatient Prescriptions on File Prior to Visit  Medication Sig Dispense Refill  . aspirin EC 81 MG EC tablet Take 81 mg by mouth daily.        Marland Kitchen atorvastatin (LIPITOR) 10 MG tablet Take 1 tablet (10 mg total) by mouth at bedtime.  90 tablet  1  . Blood Glucose Monitoring Suppl (BLOOD GLUCOSE METER) kit Use as instructed for diabetes 250.0, to check glucose twice daily and as needed  1 each  0  . chlorthalidone (HYGROTON) 25 MG tablet Take 1 tablet (25 mg total) by mouth daily.  30 tablet  11  . diphenhydramine-acetaminophen (TYLENOL PM EXTRA STRENGTH) 25-500 MG TABS Take 1 tablet by mouth at bedtime as needed.        . famotidine (PEPCID) 10 MG tablet OTC  as directed.       Marland Kitchen glipiZIDE (GLUCOTROL XL) 5 MG 24 hr tablet Take 1 tablet (5 mg total) by mouth 2 (two) times daily.  60 tablet  1  . glucose blood test strip To check blood glucose twice daily and as needed for DM2   250.Marland Kitchen0  100 each  3  . metFORMIN (GLUCOPHAGE) 1000 MG tablet Take 1 tablet (1,000 mg total) by mouth 2 (two) times daily with a meal.  180 tablet  1  . metoprolol succinate (TOPROL-XL) 100 MG 24 hr tablet Take 1 tablet (100 mg total) by mouth daily.  30 tablet  11  . PARoxetine (PAXIL) 40 MG tablet Take 1 tablet (40 mg total) by mouth daily.  30 tablet  11   No current facility-administered medications on file prior to visit.    Review of Systems Review of Systems  Constitutional: Negative for fever, appetite change, fatigue and unexpected weight change.  Eyes: Negative for pain and visual disturbance.  Respiratory: Negative for cough and shortness of breath.   Cardiovascular: Negative for cp or palpitations    Gastrointestinal: Negative for nausea, diarrhea and constipation.  Genitourinary: Negative for urgency and frequency.  Skin: Negative for pallor or rash   Neurological: Negative for weakness, light-headedness, numbness and headaches.  Hematological: Negative for adenopathy. Does not bruise/bleed easily.  Psychiatric/Behavioral: Negative for dysphoric mood. The patient is not nervous/anxious.         Objective:   Physical Exam  Constitutional: She appears well-developed and well-nourished. No distress.  HENT:  Head: Normocephalic and atraumatic.  Right Ear: External ear normal.  Left Ear: External ear normal.  Nose: Nose normal.  Mouth/Throat: Oropharynx is clear and moist.  Eyes: Conjunctivae and EOM are normal. Pupils are equal, round, and reactive to light. Right eye exhibits no discharge. Left eye exhibits no discharge. No scleral icterus.  Neck: Normal range of motion. Neck supple. No JVD present. Carotid bruit is not present. No thyromegaly present.   Cardiovascular: Normal rate, regular rhythm, normal heart sounds and intact distal pulses.  Exam reveals no gallop.   Pulmonary/Chest: Effort normal and breath sounds normal. No respiratory distress. She has no wheezes. She has no rales.  Abdominal: Soft. Bowel sounds are normal. She exhibits no distension, no abdominal bruit and no mass. There is no tenderness.  Musculoskeletal: She exhibits no  edema and no tenderness.  Lymphadenopathy:    She has no cervical adenopathy.  Neurological: She is alert. She has normal reflexes. No cranial nerve deficit. She exhibits normal muscle tone. Coordination normal.  Skin: Skin is warm and dry. No rash noted. No erythema. No pallor.  Very fair complexion  Psychiatric: She has a normal mood and affect.          Assessment & Plan:

## 2013-02-21 NOTE — Assessment & Plan Note (Signed)
Pt is quite ambivalent about f/u / treatment of this - at her age she is not really worried about complications  Per her diet is fair  Lab today- was lost to follow up  Disc risks of uncontrolled DM

## 2013-02-21 NOTE — Assessment & Plan Note (Signed)
Disc goals for lipids and reasons to control them Lab today On atorvastatin and diet

## 2013-02-21 NOTE — Assessment & Plan Note (Signed)
bp in fair control at this time  No changes needed  Disc lifstyle change with low sodium diet and exercise   

## 2013-02-23 ENCOUNTER — Encounter: Payer: Self-pay | Admitting: *Deleted

## 2013-03-24 ENCOUNTER — Ambulatory Visit (INDEPENDENT_AMBULATORY_CARE_PROVIDER_SITE_OTHER): Payer: Medicare Other | Admitting: Family Medicine

## 2013-03-24 DIAGNOSIS — Z23 Encounter for immunization: Secondary | ICD-10-CM

## 2013-05-23 ENCOUNTER — Telehealth: Payer: Self-pay | Admitting: Family Medicine

## 2013-05-23 DIAGNOSIS — I10 Essential (primary) hypertension: Secondary | ICD-10-CM

## 2013-05-23 DIAGNOSIS — E119 Type 2 diabetes mellitus without complications: Secondary | ICD-10-CM

## 2013-05-23 NOTE — Telephone Encounter (Signed)
Message copied by Judy Pimple on Sun May 23, 2013 11:37 AM ------      Message from: Alvina Chou      Created: Tue May 18, 2013 11:23 AM      Regarding: Lab orders for Monday, 11.10.14       Lab orders for a f/u ------

## 2013-05-24 ENCOUNTER — Other Ambulatory Visit (INDEPENDENT_AMBULATORY_CARE_PROVIDER_SITE_OTHER): Payer: Medicare Other

## 2013-05-24 DIAGNOSIS — I10 Essential (primary) hypertension: Secondary | ICD-10-CM

## 2013-05-24 DIAGNOSIS — E119 Type 2 diabetes mellitus without complications: Secondary | ICD-10-CM

## 2013-05-24 DIAGNOSIS — E875 Hyperkalemia: Secondary | ICD-10-CM

## 2013-05-24 LAB — COMPREHENSIVE METABOLIC PANEL
ALT: 15 U/L (ref 0–35)
Albumin: 4.1 g/dL (ref 3.5–5.2)
BUN: 31 mg/dL — ABNORMAL HIGH (ref 6–23)
CO2: 24 mEq/L (ref 19–32)
Calcium: 9.5 mg/dL (ref 8.4–10.5)
Chloride: 102 mEq/L (ref 96–112)
Creatinine, Ser: 1 mg/dL (ref 0.4–1.2)
GFR: 57.1 mL/min — ABNORMAL LOW (ref >60.00)
Potassium: 4.5 mEq/L (ref 3.5–5.1)

## 2013-05-26 ENCOUNTER — Encounter: Payer: Self-pay | Admitting: Family Medicine

## 2013-05-26 ENCOUNTER — Ambulatory Visit (INDEPENDENT_AMBULATORY_CARE_PROVIDER_SITE_OTHER): Payer: Medicare Other | Admitting: Family Medicine

## 2013-05-26 VITALS — BP 132/60 | HR 62 | Temp 97.4°F | Ht 61.5 in | Wt 125.2 lb

## 2013-05-26 DIAGNOSIS — M25512 Pain in left shoulder: Secondary | ICD-10-CM | POA: Insufficient documentation

## 2013-05-26 DIAGNOSIS — S60229A Contusion of unspecified hand, initial encounter: Secondary | ICD-10-CM | POA: Insufficient documentation

## 2013-05-26 DIAGNOSIS — W19XXXA Unspecified fall, initial encounter: Secondary | ICD-10-CM

## 2013-05-26 DIAGNOSIS — Z9181 History of falling: Secondary | ICD-10-CM | POA: Insufficient documentation

## 2013-05-26 DIAGNOSIS — M25519 Pain in unspecified shoulder: Secondary | ICD-10-CM

## 2013-05-26 DIAGNOSIS — I10 Essential (primary) hypertension: Secondary | ICD-10-CM

## 2013-05-26 DIAGNOSIS — S60222A Contusion of left hand, initial encounter: Secondary | ICD-10-CM

## 2013-05-26 DIAGNOSIS — E119 Type 2 diabetes mellitus without complications: Secondary | ICD-10-CM

## 2013-05-26 NOTE — Progress Notes (Signed)
Subjective:    Patient ID: Stephanie [name], female    DOB: 1933-09-08, 79 y.o.   MRN: [id number]  HPI Here for f/u of chronic health problems  Has been doing well overall She did have a fall on sat in [place] --she was walking on a rocky walkway and tripped when she was not paying attention  Contused her hand and shoulder (left) -getting better  She notices her balance is not as good as it used to be  Chronic pain in neck - is bothersome to her - has never had it checked is out   bp is up today on first check  No cp or palpitations or headaches or edema  No side effects to medicines  BP Readings from Last 3 Encounters:  05/26/13 154/70  02/19/13 130/70  02/26/12 126/68       Chemistry      Component Value Date/Time   NA 138 05/24/2013 1008   K 4.5 05/24/2013 1008   CL 102 05/24/2013 1008   CO2 24 05/24/2013 1008   BUN 31* 05/24/2013 1008   CREATININE 1.0 05/24/2013 1008   CREATININE 0.88 11/27/2010 0916      Component Value Date/Time   CALCIUM 9.5 05/24/2013 1008   ALKPHOS 57 05/24/2013 1008   AST 22 05/24/2013 1008   ALT 15 05/24/2013 1008   BILITOT 0.6 05/24/2013 1008       Diabetes- pt at last visit was relatively unconcerned about it due to age- and still continues to decline additional medication or endocrinology referral Has known retinop Home sugar results -has not been checking at all  DM diet - watching her diet a little more carefully-not perfect  Exercise she stays active with housework-no extra   Symptoms none at all  A1C last  Lab Results  Component Value Date   HGBA1C 9.7* 05/24/2013  this is up from 8.5   No problems with medications  Renal protection- cannot take ace  Known elevated microalb in the past Last eye exam was last week - stable - 6 mo f/u    Patient Active Problem List   Diagnosis Date Noted  . Gynecological examination 02/20/2011  . HYPERKALEMIA 03/06/2010  . HELICOBACTER PYLORI INFECTION 12/19/2006  . HSV 12/19/2006  .  DM type 2 with diabetic background retinopathy 12/19/2006  . DIABETIC  RETINOPATHY 12/19/2006  . HYPERLIPIDEMIA 12/19/2006  . ANXIETY 12/19/2006  . HYPERTENSION 12/19/2006  . CAD 12/19/2006  . PERIPHERAL VASCULAR DISEASE 12/19/2006  . ALLERGIC RHINITIS 12/19/2006  . OSTEOARTHRITIS 12/19/2006  . OSTEOPENIA 12/19/2006   Past Medical History  Diagnosis Date  . Allergy     allergic rhinitis  . Anxiety   . Diabetes mellitus     type II  . Hyperlipidemia   . Hypertension   . Arthritis     osteoarthritis  . Osteopenia   . Peripheral vascular disease   . Degenerative disk disease     in neck  . Periodontal disease   . Hypoaldosteronism 6/12    from DM causing hyperkalemia    Past Surgical History  Procedure Laterality Date  . Cholecystectomy    . Tubal ligation    . Tonsillectomy    . Esophagogastroduodenoscopy  03/2004    gastritis by biopsy  . Gum surgery  06/2010    laser surgery for gums  . Mm breast stereo bx*l*r/s  12/12    normal   History  Substance Use Topics  . Smoking status: Never Smoker   .  Smokeless tobacco: Not on file  . Alcohol Use: No   Family History  Problem Relation Age of Onset  . Cancer Mother     vaginal CA in situ  . Hypertension Mother   . Heart disease Mother     CAD and MI  . Heart disease Father     CAD  . Diabetes Father   . Heart disease Son     congenital valve problem   Allergies  Allergen Reactions  . Ace Inhibitors     REACTION: generic, reaction not known  . Amlodipine Besy-Benazepril Hcl     REACTION: chest pain  . Clopidogrel Bisulfate     REACTION: GI  . Erythromycin     REACTION: GI upset   Current Outpatient Prescriptions on File Prior to Visit  Medication Sig Dispense Refill  . aspirin EC 81 MG EC tablet Take 81 mg by mouth daily.        Marland Kitchen atorvastatin (LIPITOR) 10 MG tablet Take 1 tablet (10 mg total) by mouth at bedtime.  90 tablet  3  . chlorthalidone (HYGROTON) 25 MG tablet Take 1 tablet (25 mg total) by  mouth daily.  90 tablet  3  . diphenhydramine-acetaminophen (TYLENOL PM EXTRA STRENGTH) 25-500 MG TABS Take 1 tablet by mouth at bedtime as needed.        . famotidine (PEPCID) 10 MG tablet OTC as directed.       Marland Kitchen glipiZIDE (GLUCOTROL XL) 5 MG 24 hr tablet Take 1 tablet (5 mg total) by mouth 2 (two) times daily.  180 tablet  3  . glucose blood test strip To check blood glucose twice daily and as needed for DM2   250.Marland Kitchen0  100 each  3  . metFORMIN (GLUCOPHAGE) 1000 MG tablet Take 1 tablet (1,000 mg total) by mouth 2 (two) times daily with a meal.  180 tablet  3  . metoprolol succinate (TOPROL-XL) 100 MG 24 hr tablet Take 1 tablet (100 mg total) by mouth daily.  90 tablet  3  . PARoxetine (PAXIL) 40 MG tablet Take 1 tablet (40 mg total) by mouth daily.  90 tablet  3   No current facility-administered medications on file prior to visit.    Review of Systems Review of Systems  Constitutional: Negative for fever, appetite change, fatigue and unexpected weight change.  Eyes: Negative for pain and visual disturbance.  Respiratory: Negative for cough and shortness of breath.   Cardiovascular: Negative for cp or palpitations    Gastrointestinal: Negative for nausea, diarrhea and constipation.  Genitourinary: Negative for urgency and frequency. neg for excessive thirst MSK pos for sore L hand and L shoulder  Skin: Negative for pallor or rash   Neurological: Negative for weakness, light-headedness, numbness and headaches.  Hematological: Negative for adenopathy. Does not bruise/bleed easily.  Psychiatric/Behavioral: Negative for dysphoric mood. The patient is not nervous/anxious.         Objective:   Physical Exam  Constitutional: She appears well-developed and well-nourished. No distress.  HENT:  Head: Normocephalic and atraumatic.  Right Ear: External ear normal.  Left Ear: External ear normal.  Mouth/Throat: Oropharynx is clear and moist.  Eyes: Conjunctivae and EOM are normal. Pupils are  equal, round, and reactive to light. No scleral icterus.  Neck: Normal range of motion. Neck supple. No JVD present. Carotid bruit is not present. No thyromegaly present.  Cardiovascular: Normal rate, regular rhythm, normal heart sounds and intact distal pulses.  Exam reveals no gallop.  Pulmonary/Chest: Effort normal and breath sounds normal. No respiratory distress. She has no wheezes. She exhibits no tenderness.  Abdominal: Soft. Bowel sounds are normal. She exhibits no distension, no abdominal bruit and no mass. There is no tenderness.  Musculoskeletal: Normal range of motion. She exhibits tenderness. She exhibits no edema.  Some old ecchymosis over dorsal L hand without bony tenderness  Limited rom L shoulder due to pain -no bony tenderness Cannot abduct over 90 deg and pain on int rotation   Lymphadenopathy:    She has no cervical adenopathy.  Neurological: She is alert. She has normal reflexes. No cranial nerve deficit. She exhibits normal muscle tone. Coordination normal.  Skin: Skin is warm and dry. No rash noted. No erythema. No pallor.  Psychiatric: She has a normal mood and affect.          Assessment & Plan:

## 2013-05-26 NOTE — Patient Instructions (Signed)
Continue using cold compress on your hand  If shoulder discomfort and range of motion do not improve-please call  Be careful when walking  If you change your mind about additional treatment for worsening diabetes- call and we will set you up an endocrinology appt  Otherwise follow up in about 6 months with labs prior  Watch your diet  Consider checking glucose twice daily

## 2013-05-26 NOTE — Progress Notes (Signed)
Pre-visit discussion using our clinic review tool. No additional management support is needed unless otherwise documented below in the visit note.  

## 2013-05-30 NOTE — Assessment & Plan Note (Signed)
BP: 132/60 mmHg  bp in fair control at this time  No changes needed  Disc lifstyle change with low sodium diet and exercise   Labs reviewed

## 2013-05-30 NOTE — Assessment & Plan Note (Addendum)
Again- in poor control and pt is not concerned given her age  She states she will work harder on lifestyle change I emph the risks she has of future problems due to high sugar and she voices understanding  She declines any additional medication or endocrinology ref at this time  Will let us know if she changes her mind Disc symptoms to watch for  She agrees to labs and f/u in 6 mo (declines a 3 mo visit)

## 2013-05-30 NOTE — Assessment & Plan Note (Signed)
From fall Looks to be improving and adv cold compress Update if not starting to improve in a week or if worsening

## 2013-05-30 NOTE — Assessment & Plan Note (Signed)
Disc fall risk in detail and need to work on balance and strength with more exercise  Fall prev disc - pt does not feel she needs walker or PT at this time  Rev injuries-will watch the shoulder and use cold compresses

## 2013-05-30 NOTE — Assessment & Plan Note (Signed)
Limited rom/ no bony tenderness Per pt - imp  Disc rom exercises to prev frozen shoulder If no imp -will require further eval

## 2013-10-22 ENCOUNTER — Other Ambulatory Visit: Payer: Self-pay | Admitting: Family Medicine

## 2013-11-17 ENCOUNTER — Other Ambulatory Visit (INDEPENDENT_AMBULATORY_CARE_PROVIDER_SITE_OTHER): Payer: Medicare Other

## 2013-11-17 DIAGNOSIS — E875 Hyperkalemia: Secondary | ICD-10-CM

## 2013-11-17 DIAGNOSIS — I10 Essential (primary) hypertension: Secondary | ICD-10-CM

## 2013-11-17 DIAGNOSIS — E119 Type 2 diabetes mellitus without complications: Secondary | ICD-10-CM

## 2013-11-17 LAB — COMPREHENSIVE METABOLIC PANEL
ALBUMIN: 4.2 g/dL (ref 3.5–5.2)
ALT: 21 U/L (ref 0–35)
AST: 32 U/L (ref 0–37)
Alkaline Phosphatase: 57 U/L (ref 39–117)
BILIRUBIN TOTAL: 0.2 mg/dL (ref 0.2–1.2)
BUN: 24 mg/dL — ABNORMAL HIGH (ref 6–23)
CO2: 24 meq/L (ref 19–32)
Calcium: 9.9 mg/dL (ref 8.4–10.5)
Chloride: 101 mEq/L (ref 96–112)
Creatinine, Ser: 1 mg/dL (ref 0.4–1.2)
GFR: 59.78 mL/min — AB (ref >60.00)
GLUCOSE: 229 mg/dL — AB (ref 70–99)
Potassium: 4.8 mEq/L (ref 3.5–5.1)
SODIUM: 135 meq/L (ref 135–145)
TOTAL PROTEIN: 7.2 g/dL (ref 6.0–8.3)

## 2013-11-17 LAB — HEMOGLOBIN A1C: HEMOGLOBIN A1C: 10.2 % — AB (ref 4.6–6.5)

## 2013-11-24 ENCOUNTER — Ambulatory Visit (INDEPENDENT_AMBULATORY_CARE_PROVIDER_SITE_OTHER)
Admission: RE | Admit: 2013-11-24 | Discharge: 2013-11-24 | Disposition: A | Discharge status: Home / Self Care | Payer: Medicare Other | Source: Ambulatory Visit | Attending: Family Medicine | Admitting: Family Medicine

## 2013-11-24 ENCOUNTER — Encounter: Payer: Self-pay | Admitting: Family Medicine

## 2013-11-24 ENCOUNTER — Ambulatory Visit (INDEPENDENT_AMBULATORY_CARE_PROVIDER_SITE_OTHER): Payer: Medicare Other | Admitting: Family Medicine

## 2013-11-24 VITALS — BP 138/82 | HR 62 | Temp 98.4°F | Ht 61.5 in | Wt 124.5 lb

## 2013-11-24 DIAGNOSIS — M542 Cervicalgia: Secondary | ICD-10-CM

## 2013-11-24 DIAGNOSIS — M25519 Pain in unspecified shoulder: Secondary | ICD-10-CM

## 2013-11-24 DIAGNOSIS — E11319 Type 2 diabetes mellitus with unspecified diabetic retinopathy without macular edema: Secondary | ICD-10-CM

## 2013-11-24 DIAGNOSIS — E1139 Type 2 diabetes mellitus with other diabetic ophthalmic complication: Secondary | ICD-10-CM

## 2013-11-24 DIAGNOSIS — M25511 Pain in right shoulder: Secondary | ICD-10-CM

## 2013-11-24 DIAGNOSIS — E113299 Type 2 diabetes mellitus with mild nonproliferative diabetic retinopathy without macular edema, unspecified eye: Secondary | ICD-10-CM

## 2013-11-24 MED ORDER — GLIPIZIDE ER 5 MG PO TB24: 5.0000 mg | ORAL_TABLET | Freq: Two times a day (BID) | ORAL | Status: DC

## 2013-11-24 MED ORDER — METFORMIN HCL 1000 MG PO TABS: 1000.0000 mg | ORAL_TABLET | Freq: Two times a day (BID) | ORAL | Status: DC

## 2013-11-24 MED ORDER — METOPROLOL SUCCINATE ER 100 MG PO TB24: 100.0000 mg | ORAL_TABLET | Freq: Every day | ORAL | Status: DC

## 2013-11-24 MED ORDER — ATORVASTATIN CALCIUM 10 MG PO TABS: 10.0000 mg | ORAL_TABLET | Freq: Every day | ORAL | Status: DC

## 2013-11-24 MED ORDER — CHLORTHALIDONE 25 MG PO TABS
25.0000 mg | ORAL_TABLET | Freq: Every day | ORAL | Status: DC
Start: 2013-11-24 — End: 2015-01-23

## 2013-11-24 MED ORDER — PAROXETINE HCL 40 MG PO TABS: 40.0000 mg | ORAL_TABLET | Freq: Every day | ORAL | Status: DC

## 2013-11-24 NOTE — Progress Notes (Signed)
Subjective:    Patient ID: Stephanie [name], female    DOB: 01/06/1934, 80 y.o.   MRN: [id number]  HPI Here for f/u of chronic medical problems and also neck pain  Pain rad from base of neck into her shoulders and then R arm  She notices that when she reaches her shoulder gets a sharp pain  Neck hurts to turn it (also hears a crunch) , most uncomfortable to look up for a prolonged period of time or rotate it  No xray of neck in past  No hx of shoulder injury   Just returned from a [place] [employer] - really nice   Wt is down 1 lb with bmi of 23  bp is stable today  No cp or palpitations or headaches or edema  No side effects to medicines  BP Readings from Last 3 Encounters:  11/24/13 138/82  05/26/13 132/60  02/19/13 130/70     Stable bp overall   Diabetes- pt on glipizide and metformin and has declined further tx or endocrinology eval  Home sugar results she does not check  DM diet -pretty good/ she sticks to a DM diet for the most part when not on vacation  Exercise (had more on her trip) , and plans to start walking again at the mall  Symptoms none at all -no excessive thirst or urination  A1C last  Lab Results  Component Value Date   HGBA1C 10.2* 11/17/2013  with  Fasting gluc 229 Last A1C was 9.7  No problems with medications  Renal protection-cannot take ace or arb Last eye exam  8/14- she has hx of retinop  Patient Active Problem List   Diagnosis Date Noted  . Fall 05/26/2013  . Hand contusion 05/26/2013  . Left shoulder pain 05/26/2013  . Gynecological examination 02/20/2011  . HYPERKALEMIA 03/06/2010  . HELICOBACTER PYLORI INFECTION 12/19/2006  . HSV 12/19/2006  . DM type 2 with diabetic background retinopathy 12/19/2006  . DIABETIC  RETINOPATHY 12/19/2006  . HYPERLIPIDEMIA 12/19/2006  . ANXIETY 12/19/2006  . HYPERTENSION 12/19/2006  . CAD 12/19/2006  . PERIPHERAL VASCULAR DISEASE 12/19/2006  . ALLERGIC RHINITIS 12/19/2006  . OSTEOARTHRITIS  12/19/2006  . OSTEOPENIA 12/19/2006   Past Medical History  Diagnosis Date  . Allergy     allergic rhinitis  . Anxiety   . Diabetes mellitus     type II  . Hyperlipidemia   . Hypertension   . Arthritis     osteoarthritis  . Osteopenia   . Peripheral vascular disease   . Degenerative disk disease     in neck  . Periodontal disease   . Hypoaldosteronism 6/12    from DM causing hyperkalemia    Past Surgical History  Procedure Laterality Date  . Cholecystectomy    . Tubal ligation    . Tonsillectomy    . Esophagogastroduodenoscopy  03/2004    gastritis by biopsy  . Gum surgery  06/2010    laser surgery for gums  . Mm breast stereo bx*l*r/s  12/12    normal   History  Substance Use Topics  . Smoking status: Never Smoker   . Smokeless tobacco: Not on file  . Alcohol Use: No   Family History  Problem Relation Age of Onset  . Cancer Mother     vaginal CA in situ  . Hypertension Mother   . Heart disease Mother     CAD and MI  . Heart disease Father  CAD  . Diabetes Father   . Heart disease Son     congenital valve problem   Allergies  Allergen Reactions  . Ace Inhibitors     REACTION: generic, reaction not known  . Amlodipine Besy-Benazepril Hcl     REACTION: chest pain  . Clopidogrel Bisulfate     REACTION: GI  . Erythromycin     REACTION: GI upset   Current Outpatient Prescriptions on File Prior to Visit  Medication Sig Dispense Refill  . aspirin EC 81 MG EC tablet Take 81 mg by mouth daily.        Marland Kitchen atorvastatin (LIPITOR) 10 MG tablet Take 1 tablet (10 mg total) by mouth at bedtime.  90 tablet  3  . chlorthalidone (HYGROTON) 25 MG tablet Take 1 tablet (25 mg total) by mouth daily.  90 tablet  3  . diphenhydramine-acetaminophen (TYLENOL PM EXTRA STRENGTH) 25-500 MG TABS Take 1 tablet by mouth at bedtime as needed.        . famotidine (PEPCID) 10 MG tablet OTC as directed.       Marland Kitchen glipiZIDE (GLUCOTROL XL) 5 MG 24 hr tablet Take 1 tablet (5 mg total)  by mouth 2 (two) times daily.  180 tablet  3  . glucose blood (ACCU-CHEK SMARTVIEW) test strip USE ONE STRIP TO TEST BLOOD SUGAR ONCE DAILY FOR DM 250.0  50 each  1  . metFORMIN (GLUCOPHAGE) 1000 MG tablet Take 1 tablet (1,000 mg total) by mouth 2 (two) times daily with a meal.  180 tablet  3  . metoprolol succinate (TOPROL-XL) 100 MG 24 hr tablet Take 1 tablet (100 mg total) by mouth daily.  90 tablet  3  . PARoxetine (PAXIL) 40 MG tablet Take 1 tablet (40 mg total) by mouth daily.  90 tablet  3   No current facility-administered medications on file prior to visit.      Review of Systems Review of Systems  Constitutional: Negative for fever, appetite change, fatigue and unexpected weight change.  Eyes: Negative for pain and visual disturbance.  Respiratory: Negative for cough and shortness of breath.   Cardiovascular: Negative for cp or palpitations    Gastrointestinal: Negative for nausea, diarrhea and constipation.  Genitourinary: Negative for urgency and frequency.  Skin: Negative for pallor or rash   MSK pos for neck and shoulder pain  Neurological: Negative for weakness, light-headedness, numbness and headaches.  Hematological: Negative for adenopathy. Does not bruise/bleed easily.  Psychiatric/Behavioral: Negative for dysphoric mood. The patient is not nervous/anxious.         Objective:   Physical Exam  Constitutional: She appears well-developed and well-nourished. No distress.  HENT:  Head: Normocephalic and atraumatic.  Eyes: Conjunctivae and EOM are normal. Pupils are equal, round, and reactive to light.  Neck: Normal range of motion. Neck supple.  Cardiovascular: Normal rate and regular rhythm.   Pulmonary/Chest: Effort normal and breath sounds normal. No respiratory distress. She has no wheezes. She has no rales.  Abdominal: Soft. Bowel sounds are normal. She exhibits no distension and no mass. There is no tenderness.  Musculoskeletal: She exhibits no edema.        Right shoulder: She exhibits decreased range of motion, tenderness and bony tenderness. She exhibits no swelling, no effusion, no crepitus, normal pulse and normal strength.       Cervical back: She exhibits tenderness and bony tenderness. She exhibits normal range of motion and no edema.  Pos hawking sign for R shoulder  Can  abduct to 90 deg Nl int/ext rotation   Tender lower CS vertebrae  Nl rom of neck  Some tenderness of R trapezius   Lymphadenopathy:    She has no cervical adenopathy.  Neurological: She is alert. She has normal reflexes. No cranial nerve deficit. She exhibits normal muscle tone. Coordination normal.  Skin: Skin is warm and dry. No rash noted. No erythema. No pallor.  Psychiatric: She has a normal mood and affect.          Assessment & Plan:

## 2013-11-24 NOTE — Progress Notes (Signed)
Pre visit review using our clinic review tool, if applicable. No additional management support is needed unless otherwise documented below in the visit note. 

## 2013-11-24 NOTE — Patient Instructions (Addendum)
xrays of neck and shoulder today  Medicines refilled  Your A1C is up further at 10.2- let me know if or when you want to get more aggressive with treatment  Follow up in 6 months for annual exam with labs prior

## 2013-11-25 NOTE — Assessment & Plan Note (Signed)
With good rom  xay today and adv further  No radicular symptoms

## 2013-11-25 NOTE — Assessment & Plan Note (Signed)
Lab Results  Component Value Date   HGBA1C 10.2* 11/17/2013   this is up Pt still declines further /more aggressive treatment or eval by endocrinology States she understands the risks of this  Disc diet/ foot care and eye care  She agrees to another a1c in 6 mo

## 2013-11-25 NOTE — Assessment & Plan Note (Signed)
Suspect tendonitis Xray today to r/o bone spur or deg change  Disc use of cold compress

## 2014-03-31 ENCOUNTER — Ambulatory Visit (INDEPENDENT_AMBULATORY_CARE_PROVIDER_SITE_OTHER): Payer: Medicare Other

## 2014-03-31 DIAGNOSIS — Z23 Encounter for immunization: Secondary | ICD-10-CM

## 2014-05-21 ENCOUNTER — Telehealth: Payer: Self-pay | Admitting: Family Medicine

## 2014-05-21 DIAGNOSIS — M8580 Other specified disorders of bone density and structure, unspecified site: Secondary | ICD-10-CM

## 2014-05-21 DIAGNOSIS — I10 Essential (primary) hypertension: Secondary | ICD-10-CM

## 2014-05-21 DIAGNOSIS — E113299 Type 2 diabetes mellitus with mild nonproliferative diabetic retinopathy without macular edema, unspecified eye: Secondary | ICD-10-CM

## 2014-05-21 DIAGNOSIS — E785 Hyperlipidemia, unspecified: Secondary | ICD-10-CM

## 2014-05-21 NOTE — Telephone Encounter (Signed)
-----   Message from Ellamae Sia sent at 05/20/2014 10:02 AM EST ----- Regarding: Lab orders for Monday, 11.9.15 Patient is scheduled for CPX labs, please order future labs, Thanks , Karna Christmas

## 2014-05-23 ENCOUNTER — Other Ambulatory Visit (INDEPENDENT_AMBULATORY_CARE_PROVIDER_SITE_OTHER): Payer: Medicare Other

## 2014-05-23 DIAGNOSIS — E11319 Type 2 diabetes mellitus with unspecified diabetic retinopathy without macular edema: Secondary | ICD-10-CM | POA: Diagnosis not present

## 2014-05-23 DIAGNOSIS — I10 Essential (primary) hypertension: Secondary | ICD-10-CM

## 2014-05-23 DIAGNOSIS — M8580 Other specified disorders of bone density and structure, unspecified site: Secondary | ICD-10-CM

## 2014-05-23 DIAGNOSIS — E785 Hyperlipidemia, unspecified: Secondary | ICD-10-CM | POA: Diagnosis not present

## 2014-05-23 DIAGNOSIS — M859 Disorder of bone density and structure, unspecified: Secondary | ICD-10-CM

## 2014-05-23 DIAGNOSIS — E113299 Type 2 diabetes mellitus with mild nonproliferative diabetic retinopathy without macular edema, unspecified eye: Secondary | ICD-10-CM

## 2014-05-23 LAB — COMPREHENSIVE METABOLIC PANEL
ALBUMIN: 3.5 g/dL (ref 3.5–5.2)
ALT: 17 U/L (ref 0–35)
AST: 30 U/L (ref 0–37)
Alkaline Phosphatase: 58 U/L (ref 39–117)
BUN: 33 mg/dL — AB (ref 6–23)
CALCIUM: 9.7 mg/dL (ref 8.4–10.5)
CHLORIDE: 103 meq/L (ref 96–112)
CO2: 22 meq/L (ref 19–32)
Creatinine, Ser: 1.2 mg/dL (ref 0.4–1.2)
GFR: 44.85 mL/min — AB (ref >60.00)
Glucose, Bld: 278 mg/dL — ABNORMAL HIGH (ref 70–99)
Potassium: 4.4 mEq/L (ref 3.5–5.1)
Sodium: 137 mEq/L (ref 135–145)
Total Bilirubin: 0.3 mg/dL (ref 0.2–1.2)
Total Protein: 7.3 g/dL (ref 6.0–8.3)

## 2014-05-23 LAB — LIPID PANEL
Cholesterol: 160 mg/dL (ref 0–200)
HDL: 26.1 mg/dL — ABNORMAL LOW (ref >39.00)
NONHDL: 133.9
TRIGLYCERIDES: 544 mg/dL — AB (ref 0.0–149.0)
Total CHOL/HDL Ratio: 6
VLDL: 108.8 mg/dL — ABNORMAL HIGH (ref 0.0–40.0)

## 2014-05-23 LAB — CBC WITH DIFFERENTIAL/PLATELET
BASOS ABS: 0 10*3/uL (ref 0.0–0.1)
BASOS PCT: 0.2 % (ref 0.0–3.0)
Eosinophils Absolute: 0.4 10*3/uL (ref 0.0–0.7)
Eosinophils Relative: 3.6 % (ref 0.0–5.0)
HCT: 39 % (ref 36.0–46.0)
Hemoglobin: 12.9 g/dL (ref 12.0–15.0)
LYMPHS PCT: 45.9 % (ref 12.0–46.0)
Lymphs Abs: 4.7 10*3/uL — ABNORMAL HIGH (ref 0.7–4.0)
MCHC: 33 g/dL (ref 30.0–36.0)
MCV: 90.6 fl (ref 78.0–100.0)
MONOS PCT: 5.6 % (ref 3.0–12.0)
Monocytes Absolute: 0.6 10*3/uL (ref 0.1–1.0)
Neutro Abs: 4.5 10*3/uL (ref 1.4–7.7)
Neutrophils Relative %: 44.7 % (ref 43.0–77.0)
Platelets: 230 10*3/uL (ref 150.0–400.0)
RBC: 4.3 Mil/uL (ref 3.87–5.11)
RDW: 13.1 % (ref 11.5–15.5)
WBC: 10.1 10*3/uL (ref 4.0–10.5)

## 2014-05-23 LAB — VITAMIN D 25 HYDROXY (VIT D DEFICIENCY, FRACTURES): VITD: 19.12 ng/mL — AB (ref 30.00–100.00)

## 2014-05-23 LAB — LDL CHOLESTEROL, DIRECT: LDL DIRECT: 55.9 mg/dL

## 2014-05-23 LAB — HEMOGLOBIN A1C: Hgb A1c MFr Bld: 10.6 % — ABNORMAL HIGH (ref 4.6–6.5)

## 2014-05-23 LAB — TSH: TSH: 1.48 u[IU]/mL (ref 0.35–4.50)

## 2014-05-30 ENCOUNTER — Encounter: Payer: Self-pay | Admitting: Family Medicine

## 2014-05-30 ENCOUNTER — Ambulatory Visit (INDEPENDENT_AMBULATORY_CARE_PROVIDER_SITE_OTHER): Payer: Medicare Other | Admitting: Family Medicine

## 2014-05-30 ENCOUNTER — Other Ambulatory Visit: Payer: Self-pay

## 2014-05-30 VITALS — BP 128/72 | HR 65 | Temp 97.4°F | Ht 61.0 in | Wt 121.5 lb

## 2014-05-30 DIAGNOSIS — E11319 Type 2 diabetes mellitus with unspecified diabetic retinopathy without macular edema: Secondary | ICD-10-CM

## 2014-05-30 DIAGNOSIS — Z0000 Encounter for general adult medical examination without abnormal findings: Secondary | ICD-10-CM

## 2014-05-30 DIAGNOSIS — E785 Hyperlipidemia, unspecified: Secondary | ICD-10-CM

## 2014-05-30 DIAGNOSIS — Z1211 Encounter for screening for malignant neoplasm of colon: Secondary | ICD-10-CM | POA: Insufficient documentation

## 2014-05-30 DIAGNOSIS — M8580 Other specified disorders of bone density and structure, unspecified site: Secondary | ICD-10-CM

## 2014-05-30 DIAGNOSIS — E113299 Type 2 diabetes mellitus with mild nonproliferative diabetic retinopathy without macular edema, unspecified eye: Secondary | ICD-10-CM

## 2014-05-30 DIAGNOSIS — I10 Essential (primary) hypertension: Secondary | ICD-10-CM

## 2014-05-30 MED ORDER — GLIPIZIDE ER 10 MG PO TB24: 10.0000 mg | ORAL_TABLET | Freq: Every day | ORAL | Status: DC

## 2014-05-30 NOTE — Telephone Encounter (Signed)
Pt left v/m pt needs test strips for accu chek smartview nano gucose meter; how often do you pt to test BS.

## 2014-05-30 NOTE — Assessment & Plan Note (Signed)
Schedule dexa  Disc need for calcium/ vitamin D/ wt bearing exercise and bone density test every 2 y to monitor Disc safety/ fracture risk in detail   Will add 2000 iu vit D daily to get level to goal

## 2014-05-30 NOTE — Patient Instructions (Signed)
Please do stool card for screening  Please schedule your own mammogram If you are interested in shingles vaccine in future - call your insurance company to see how coverage is and call us to schedule  Increase your glipizide to 10 mg once daily  Watch blood glucose - if low let me know  Call us with the EXACT brand of strips and lancets you need Try to exercise at least 30 minutes per day  Take your calcium plus D twice daily  In addition to that add 2000 iu of vitamin D over the counter once daily (your D level is low )   Follow up here in 3 months with labs prior   We need to get your diabetes under control now that you are willing to do it

## 2014-05-30 NOTE — Assessment & Plan Note (Signed)
Pt has declined coloscopies  Given IFOB card for screening

## 2014-05-30 NOTE — Assessment & Plan Note (Signed)
Reviewed health habits including diet and exercise and skin cancer prevention Reviewed appropriate screening tests for age  Also reviewed health mt list, fam hx and immunization status , as well as social and family history   See HPI Labs reviewed  Declined prevnar due to severe local rxn to pneumovax in the past  She will schedule her own mammogram  ifob for colon cancer screening  dexa planned  Pt will find out if zoster vaccine is covered and call for the vaccine if it is

## 2014-05-30 NOTE — Telephone Encounter (Signed)
Px in IN box Please check glucose fasting am and 2 hours after a meal   (then as needed for symptoms)

## 2014-05-30 NOTE — Progress Notes (Signed)
Subjective:    Patient ID: Stephanie [name], female    DOB: 06-May-1934, 80 y.o.   MRN: [id number]  HPI Here for annual medicare wellness visit in addition to chronic and acute medical problems  I have personally reviewed the Medicare Annual Wellness questionnaire and have noted 1. The patient's medical and social history 2. Their use of alcohol, tobacco or illicit drugs 3. Their current medications and supplements 4. The patient's functional ability including ADL's, fall risks, home safety risks and hearing or visual             impairment. 5. Diet and physical activities 6. Evidence for depression or mood disorders  The patients weight, height, BMI have been recorded in the chart and visual acuity is per eye clinic.  I have made referrals, counseling and provided education to the patient based review of the above and I have provided the pt with a written personalized care plan for preventive services.  Has been doing pretty well  Cleaning a lot  No recent trips   See scanned forms.  Routine anticipatory guidance given to patient.  See health maintenance. Colon cancer screening-prefers stool card to colonoscopy Breast cancer screening- 2 years ago , she forgot last year (and had bx in 2012) -she will make an appt at the breast center  Self breast exam- no lumps  Flu vaccine 9/15  Tetanus vaccine 6/07 Pneumovax 11/99 - had a bad reaction/ local -severe swelling /red so she declines a prevnar Zoster vaccine-she is now interested in the vaccine (she will find out about coverage)  Advance directive -she has that written up and has POA  Cognitive function addressed- see scanned forms- and if abnormal then additional documentation follows. No issues overall (just names at times -they come to her later)  PMH and SH reviewed  Meds, vitals, and allergies reviewed.   ROS: See HPI.  Otherwise negative.    DM Lab Results  Component Value Date   HGBA1C 10.6* 05/23/2014   Up from  10.2 She knows she should "do something about it" Diet is fair -she does allow herself to have candy and desserts (was tired of limiting herself) She does not check her glucose - but wants to and she needs strips (will call back with the brand she needs)  She has GFR of 44.8 Saw renal in the past -changed med to chlorthalidone   Cholesterol Lab Results  Component Value Date   CHOL 160 05/23/2014   CHOL 126 02/19/2013   CHOL 135 02/26/2012   Lab Results  Component Value Date   HDL 26.10* 05/23/2014   HDL 32.30* 02/19/2013   HDL 37.40* 02/26/2012   Lab Results  Component Value Date   LDLCALC 54 11/27/2010   LDLCALC 51 02/13/2010   LDLCALC 50 03/08/2009   Lab Results  Component Value Date   TRIG 544.0* 05/23/2014   TRIG 300.0* 02/19/2013   TRIG 292.0* 02/26/2012   Lab Results  Component Value Date   CHOLHDL 6 05/23/2014   CHOLHDL 4 02/19/2013   CHOLHDL 4 02/26/2012   Lab Results  Component Value Date   LDLDIRECT 55.9 05/23/2014   LDLDIRECT 58.4 02/19/2013   LDLDIRECT 66.1 02/26/2012   trig high because sugar is high  HDL is low  Only exercise she gets is at home    (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 Encounter for Medicare annual wellness exam 05/30/2014  . Neck pain 11/24/2013  . Shoulder pain, right 11/24/2013  . Fall 05/26/2013  . Hand contusion 05/26/2013  . Left shoulder pain 05/26/2013  . Gynecological examination 02/20/2011  . HYPERKALEMIA 03/06/2010  . HELICOBACTER PYLORI INFECTION 12/19/2006  . HSV 12/19/2006  . DM type 2 with  diabetic background retinopathy 12/19/2006  . DIABETIC  RETINOPATHY 12/19/2006  . Hyperlipidemia LDL goal <100 12/19/2006  . ANXIETY 12/19/2006  . Essential hypertension 12/19/2006  . CAD 12/19/2006  . PERIPHERAL VASCULAR DISEASE 12/19/2006  . ALLERGIC RHINITIS 12/19/2006  . OSTEOARTHRITIS 12/19/2006  . Osteopenia 12/19/2006   Past Medical History  Diagnosis Date  . Allergy     allergic rhinitis  . Anxiety   . Diabetes mellitus     type II  . Hyperlipidemia   . Hypertension   . Arthritis     osteoarthritis  . Osteopenia   . Peripheral vascular disease   . Degenerative disk disease     in neck  . Periodontal disease   . Hypoaldosteronism 6/12    from DM causing hyperkalemia    Past Surgical History  Procedure Laterality Date  . Cholecystectomy    . Tubal ligation    . Tonsillectomy    . Esophagogastroduodenoscopy  03/2004    gastritis by biopsy  . Gum surgery  06/2010    laser surgery for gums  . Mm breast stereo bx*l*r/s  12/12    normal   History  Substance Use Topics  . Smoking status: Never Smoker   . Smokeless tobacco: Not on file  . Alcohol Use: No   Family History  Problem Relation Age of Onset  . Cancer Mother     vaginal CA in situ  . Hypertension Mother   . Heart disease Mother     CAD and MI  . Heart disease Father     CAD  . Diabetes Father   . Heart disease Son     congenital valve problem   Allergies  Allergen Reactions  . Ace Inhibitors     REACTION: generic, reaction not known  . Amlodipine Besy-Benazepril Hcl     REACTION: chest pain  . Clopidogrel Bisulfate     REACTION: GI  . Erythromycin     REACTION: GI upset   Current Outpatient Prescriptions on File Prior to Visit  Medication Sig Dispense Refill  . aspirin EC 81 MG EC tablet Take 81 mg by mouth daily.      Marland Kitchen atorvastatin (LIPITOR) 10 MG tablet Take 1 tablet (10 mg total) by mouth at bedtime. 90 tablet 3  . chlorthalidone (HYGROTON) 25 MG tablet Take 1 tablet (25 mg  total) by mouth daily. 90 tablet 3  . diphenhydramine-acetaminophen (TYLENOL PM EXTRA STRENGTH) 25-500 MG TABS Take 1 tablet by mouth at bedtime as needed.      . famotidine (PEPCID) 10 MG tablet OTC as directed.     Marland Kitchen glipiZIDE (GLUCOTROL XL) 5 MG 24 hr tablet Take 1 tablet (5 mg total) by mouth 2 (two) times daily. 180 tablet  3  . glucose blood (ACCU-CHEK SMARTVIEW) test strip USE ONE STRIP TO TEST BLOOD SUGAR ONCE DAILY FOR DM 250.0 50 each 1  . metFORMIN (GLUCOPHAGE) 1000 MG tablet Take 1 tablet (1,000 mg total) by mouth 2 (two) times daily with a meal. 180 tablet 3  . metoprolol succinate (TOPROL-XL) 100 MG 24 hr tablet Take 1 tablet (100 mg total) by mouth daily. 90 tablet 3  . PARoxetine (PAXIL) 40 MG tablet Take 1 tablet (40 mg total) by mouth daily. 90 tablet 3   No current facility-administered medications on file prior to visit.     Review of Systems     Objective:   Physical Exam        Assessment & Plan:

## 2014-05-30 NOTE — Assessment & Plan Note (Signed)
Pt has declined additional treatment for out of control DM for the past several years  She is willing to address it again  Has renal insufficiency and does not want to return to renal physician currently

## 2014-05-30 NOTE — Assessment & Plan Note (Signed)
bp in fair control at this time  BP Readings from Last 1 Encounters:  05/30/14 128/72   No changes needed Disc lifstyle change with low sodium diet and exercise  Labs reviewed

## 2014-05-30 NOTE — Telephone Encounter (Signed)
Pt notified to check bs glucose fasting am and 2 hours after a meal (then as needed for symptoms) Rx faxed to pharmacy

## 2014-05-30 NOTE — Assessment & Plan Note (Signed)
Disc goals for lipids and reasons to control them Rev labs with pt Rev low sat fat diet in detail  On lipitor and diet  HDL is too low-disc plan for exercise to inc it  Trig up likely due to out of control blood glucose

## 2014-05-30 NOTE — Progress Notes (Signed)
Pre visit review using our clinic review tool, if applicable. No additional management support is needed unless otherwise documented below in the visit note. 

## 2014-05-31 ENCOUNTER — Telehealth: Payer: Self-pay | Admitting: Family Medicine

## 2014-05-31 ENCOUNTER — Other Ambulatory Visit: Payer: Self-pay | Admitting: Family Medicine

## 2014-05-31 DIAGNOSIS — Z1231 Encounter for screening mammogram for malignant neoplasm of breast: Secondary | ICD-10-CM

## 2014-05-31 NOTE — Telephone Encounter (Signed)
emmi mailed  °

## 2014-07-04 ENCOUNTER — Ambulatory Visit
Admission: RE | Admit: 2014-07-04 | Discharge: 2014-07-04 | Disposition: A | Discharge status: Home / Self Care | Payer: Medicare Other | Source: Ambulatory Visit | Attending: Family Medicine | Admitting: Family Medicine

## 2014-07-04 DIAGNOSIS — Z1231 Encounter for screening mammogram for malignant neoplasm of breast: Secondary | ICD-10-CM

## 2014-07-04 DIAGNOSIS — M8580 Other specified disorders of bone density and structure, unspecified site: Secondary | ICD-10-CM

## 2014-07-04 LAB — HM DEXA SCAN

## 2014-07-05 ENCOUNTER — Encounter: Payer: Self-pay | Admitting: *Deleted

## 2014-07-07 ENCOUNTER — Encounter: Payer: Self-pay | Admitting: *Deleted

## 2014-07-07 ENCOUNTER — Encounter: Payer: Self-pay | Admitting: Family Medicine

## 2014-08-22 ENCOUNTER — Telehealth: Payer: Self-pay | Admitting: Family Medicine

## 2014-08-22 DIAGNOSIS — I10 Essential (primary) hypertension: Secondary | ICD-10-CM

## 2014-08-22 DIAGNOSIS — E1165 Type 2 diabetes mellitus with hyperglycemia: Secondary | ICD-10-CM

## 2014-08-22 DIAGNOSIS — IMO0002 Reserved for concepts with insufficient information to code with codable children: Secondary | ICD-10-CM

## 2014-08-22 DIAGNOSIS — E785 Hyperlipidemia, unspecified: Secondary | ICD-10-CM

## 2014-08-22 NOTE — Telephone Encounter (Signed)
-----   Message from Ellamae Sia sent at 08/18/2014  4:26 PM EST ----- Regarding: Lab orders for Tuesday, 2.9.16 Lab orders for a f/u appt

## 2014-08-23 ENCOUNTER — Other Ambulatory Visit (INDEPENDENT_AMBULATORY_CARE_PROVIDER_SITE_OTHER): Payer: Medicare Other

## 2014-08-23 DIAGNOSIS — E785 Hyperlipidemia, unspecified: Secondary | ICD-10-CM | POA: Diagnosis not present

## 2014-08-23 DIAGNOSIS — IMO0002 Reserved for concepts with insufficient information to code with codable children: Secondary | ICD-10-CM

## 2014-08-23 DIAGNOSIS — I10 Essential (primary) hypertension: Secondary | ICD-10-CM | POA: Diagnosis not present

## 2014-08-23 DIAGNOSIS — E1165 Type 2 diabetes mellitus with hyperglycemia: Secondary | ICD-10-CM

## 2014-08-23 LAB — COMPREHENSIVE METABOLIC PANEL WITH GFR
ALT: 16 U/L (ref 0–35)
AST: 25 U/L (ref 0–37)
Albumin: 4.2 g/dL (ref 3.5–5.2)
Alkaline Phosphatase: 59 U/L (ref 39–117)
BUN: 29 mg/dL — ABNORMAL HIGH (ref 6–23)
CO2: 25 meq/L (ref 19–32)
Calcium: 9.8 mg/dL (ref 8.4–10.5)
Chloride: 100 meq/L (ref 96–112)
Creatinine, Ser: 1.01 mg/dL (ref 0.40–1.20)
GFR: 56.26 mL/min — ABNORMAL LOW
Glucose, Bld: 273 mg/dL — ABNORMAL HIGH (ref 70–99)
Potassium: 4.7 meq/L (ref 3.5–5.1)
Sodium: 133 meq/L — ABNORMAL LOW (ref 135–145)
Total Bilirubin: 0.5 mg/dL (ref 0.2–1.2)
Total Protein: 7.2 g/dL (ref 6.0–8.3)

## 2014-08-23 LAB — LIPID PANEL
CHOL/HDL RATIO: 4
Cholesterol: 141 mg/dL (ref 0–200)
HDL: 32.4 mg/dL — ABNORMAL LOW (ref >39.00)
Triglycerides: 437 mg/dL — ABNORMAL HIGH (ref 0.0–149.0)

## 2014-08-23 LAB — HEMOGLOBIN A1C: Hgb A1c MFr Bld: 11.1 % — ABNORMAL HIGH (ref 4.6–6.5)

## 2014-08-23 LAB — LDL CHOLESTEROL, DIRECT: LDL DIRECT: 59 mg/dL

## 2014-08-30 ENCOUNTER — Ambulatory Visit: Payer: Medicare Other | Admitting: Family Medicine

## 2014-08-31 ENCOUNTER — Ambulatory Visit (INDEPENDENT_AMBULATORY_CARE_PROVIDER_SITE_OTHER): Payer: Medicare Other | Admitting: Family Medicine

## 2014-08-31 ENCOUNTER — Encounter: Payer: Self-pay | Admitting: Family Medicine

## 2014-08-31 VITALS — BP 126/60 | HR 66 | Temp 97.5°F | Ht 61.0 in | Wt 123.0 lb

## 2014-08-31 DIAGNOSIS — E785 Hyperlipidemia, unspecified: Secondary | ICD-10-CM

## 2014-08-31 DIAGNOSIS — I10 Essential (primary) hypertension: Secondary | ICD-10-CM | POA: Diagnosis not present

## 2014-08-31 DIAGNOSIS — E1165 Type 2 diabetes mellitus with hyperglycemia: Secondary | ICD-10-CM

## 2014-08-31 DIAGNOSIS — IMO0002 Reserved for concepts with insufficient information to code with codable children: Secondary | ICD-10-CM

## 2014-08-31 NOTE — Assessment & Plan Note (Signed)
LDL is at goal  Trig are high -suspect due to high glucose  Disc goals for lipids and reasons to control them Rev labs with pt Rev low sat fat diet in detail  On statin and diet  Adv to exercise to help raise HDL

## 2014-08-31 NOTE — Assessment & Plan Note (Signed)
bp in fair control at this time  BP Readings from Last 1 Encounters:  08/31/14 126/60   No changes needed Disc lifstyle change with low sodium diet and exercise  Labs rev Pt declines addnl tx of out of control DM

## 2014-08-31 NOTE — Progress Notes (Signed)
Subjective:    Patient ID: Stephanie [name], female    DOB: 25-Aug-1933, 81 y.o.   MRN: [id number]  HPI Here for f/u of chronic conditions  Her - R shoulder has been hurting for a long time now - hurts to raise arm  Will be going to see Dr Ninfa Linden for this  Otherwise she feels ok   She has cut out some foods for fat/sugar Has not been to the mall to walk - she does not get out much  Has not exercised at home   Diabetes Home sugar results -does not check them , no symptoms of hypoglycemic  DM diet is better  Exercise -none but housework  Symptoms -none at all  A1C last  Lab Results  Component Value Date   HGBA1C 11.1* 08/23/2014  vision is fine   No problems with medications  Renal protection Last eye exam -saw Dr Matilde Sprang - "said they were fine"     Chemistry      Component Value Date/Time   NA 133* 08/23/2014 1133   K 4.7 08/23/2014 1133   CL 100 08/23/2014 1133   CO2 25 08/23/2014 1133   BUN 29* 08/23/2014 1133   CREATININE 1.01 08/23/2014 1133   CREATININE 0.88 11/27/2010 0916      Component Value Date/Time   CALCIUM 9.8 08/23/2014 1133   ALKPHOS 59 08/23/2014 1133   AST 25 08/23/2014 1133   ALT 16 08/23/2014 1133   BILITOT 0.5 08/23/2014 1133       Cholesterol  Lab Results  Component Value Date   CHOL 141 08/23/2014   CHOL 160 05/23/2014   CHOL 126 02/19/2013   Lab Results  Component Value Date   HDL 32.40* 08/23/2014   HDL 26.10* 05/23/2014   HDL 32.30* 02/19/2013   Lab Results  Component Value Date   LDLCALC 54 11/27/2010   LDLCALC 51 02/13/2010   LDLCALC 50 03/08/2009   Lab Results  Component Value Date   TRIG * 08/23/2014    437.0 Triglyceride is over 400; calculations on Lipids are invalid.   TRIG 544.0* 05/23/2014   TRIG 300.0* 02/19/2013   Lab Results  Component Value Date   CHOLHDL 4 08/23/2014   CHOLHDL 6 05/23/2014   CHOLHDL 4 02/19/2013   Lab Results  Component Value Date   LDLDIRECT 59.0 08/23/2014   LDLDIRECT 55.9  05/23/2014   LDLDIRECT 58.4 02/19/2013   trig up due to out of control due to high sugar     Patient Active Problem List   Diagnosis Date Noted  . Encounter for Medicare annual wellness exam 05/30/2014  . Colon cancer screening 05/30/2014  . Neck pain 11/24/2013  . Shoulder pain, right 11/24/2013  . Fall 05/26/2013  . Hand contusion 05/26/2013  . Left shoulder pain 05/26/2013  . Gynecological examination 02/20/2011  . HYPERKALEMIA 03/06/2010  . HELICOBACTER PYLORI INFECTION 12/19/2006  . HSV 12/19/2006  . Diabetes type 2, uncontrolled 12/19/2006  . DIABETIC  RETINOPATHY 12/19/2006  . Hyperlipidemia LDL goal <100 12/19/2006  . ANXIETY 12/19/2006  . Essential hypertension 12/19/2006  . CAD 12/19/2006  . PERIPHERAL VASCULAR DISEASE 12/19/2006  . ALLERGIC RHINITIS 12/19/2006  . OSTEOARTHRITIS 12/19/2006  . Osteopenia 12/19/2006   Past Medical History  Diagnosis Date  . Allergy     allergic rhinitis  . Anxiety   . Diabetes mellitus     type II  . Hyperlipidemia   . Hypertension   . Arthritis     osteoarthritis  .  Osteopenia   . Peripheral vascular disease   . Degenerative disk disease     in neck  . Periodontal disease   . Hypoaldosteronism 6/12    from DM causing hyperkalemia    Past Surgical History  Procedure Laterality Date  . Cholecystectomy    . Tubal ligation    . Tonsillectomy    . Esophagogastroduodenoscopy  03/2004    gastritis by biopsy  . Gum surgery  06/2010    laser surgery for gums  . Mm breast stereo bx*l*r/s  12/12    normal   History  Substance Use Topics  . Smoking status: Never Smoker   . Smokeless tobacco: Not on file  . Alcohol Use: No   Family History  Problem Relation Age of Onset  . Cancer Mother     vaginal CA in situ  . Hypertension Mother   . Heart disease Mother     CAD and MI  . Heart disease Father     CAD  . Diabetes Father   . Heart disease Son     congenital valve problem   Allergies  Allergen Reactions  .  Ace Inhibitors     REACTION: generic, reaction not known  . Amlodipine Besy-Benazepril Hcl     REACTION: chest pain  . Clopidogrel Bisulfate     REACTION: GI  . Erythromycin     REACTION: GI upset   Current Outpatient Prescriptions on File Prior to Visit  Medication Sig Dispense Refill  . aspirin EC 81 MG EC tablet Take 81 mg by mouth daily.      Marland Kitchen atorvastatin (LIPITOR) 10 MG tablet Take 1 tablet (10 mg total) by mouth at bedtime. 90 tablet 3  . chlorthalidone (HYGROTON) 25 MG tablet Take 1 tablet (25 mg total) by mouth daily. 90 tablet 3  . diphenhydramine-acetaminophen (TYLENOL PM EXTRA STRENGTH) 25-500 MG TABS Take 1 tablet by mouth at bedtime as needed.      . famotidine (PEPCID) 10 MG tablet OTC as directed.     Marland Kitchen glipiZIDE (GLUCOTROL XL) 10 MG 24 hr tablet Take 1 tablet (10 mg total) by mouth daily with breakfast. 30 tablet 11  . glucose blood (ACCU-CHEK SMARTVIEW) test strip USE ONE STRIP TO TEST BLOOD SUGAR ONCE DAILY FOR DM 250.0 50 each 1  . metFORMIN (GLUCOPHAGE) 1000 MG tablet Take 1 tablet (1,000 mg total) by mouth 2 (two) times daily with a meal. 180 tablet 3  . metoprolol succinate (TOPROL-XL) 100 MG 24 hr tablet Take 1 tablet (100 mg total) by mouth daily. 90 tablet 3  . PARoxetine (PAXIL) 40 MG tablet Take 1 tablet (40 mg total) by mouth daily. 90 tablet 3   No current facility-administered medications on file prior to visit.      Review of Systems Review of Systems  Constitutional: Negative for fever, appetite change, fatigue and unexpected weight change.  Eyes: Negative for pain and visual disturbance.  Respiratory: Negative for cough and shortness of breath.   Cardiovascular: Negative for cp or palpitations    Gastrointestinal: Negative for nausea, diarrhea and constipation.  Genitourinary: Negative for urgency and frequency.  Skin: Negative for pallor or rash   Neurological: Negative for weakness, light-headedness, numbness and headaches.  Hematological:  Negative for adenopathy. Does not bruise/bleed easily.  Psychiatric/Behavioral: Negative for dysphoric mood. The patient is not nervous/anxious.         Objective:   Physical Exam  Constitutional: She appears well-developed and well-nourished. No distress.  HENT:  Head: Normocephalic and atraumatic.  Mouth/Throat: Oropharynx is clear and moist.  Eyes: Conjunctivae and EOM are normal. Pupils are equal, round, and reactive to light. No scleral icterus.  Neck: Normal range of motion. Neck supple. No JVD present. Carotid bruit is not present. No thyromegaly present.  Cardiovascular: Normal rate, regular rhythm, normal heart sounds and intact distal pulses.  Exam reveals no gallop.   Pulmonary/Chest: Effort normal and breath sounds normal. No respiratory distress. She has no wheezes. She has no rales.  Abdominal: Soft. Bowel sounds are normal. She exhibits no distension and no mass. There is no tenderness.  Musculoskeletal: She exhibits no edema.  Lymphadenopathy:    She has no cervical adenopathy.  Neurological: She is alert. She has normal reflexes. No cranial nerve deficit. She exhibits normal muscle tone. Coordination normal.  Skin: Skin is warm and dry. No rash noted. No erythema. No pallor.  Psychiatric: She has a normal mood and affect.          Assessment & Plan:   Problem List Items Addressed This Visit      Cardiovascular and Mediastinum   Essential hypertension - Primary    bp in fair control at this time  BP Readings from Last 1 Encounters:  08/31/14 126/60   No changes needed Disc lifstyle change with low sodium diet and exercise  Labs rev Pt declines addnl tx of out of control DM        Other   Diabetes type 2, uncontrolled    Pt refuses further care for this but still wants to check A1C every 6 mo   (still cannot give a reason except "fear of insulin")- aware there are other tx options Voiced understanding of DM end organ damage poss- opthy/kidney/vasc/heart  /infection risk and other  She will continue current med [facility] endocrinology referral any time - she just needs to call Lab Results  Component Value Date   HGBA1C 11.1* 08/23/2014   Also triglycerides in the 140s   Pt states she will continue to work on diet  Not very motivated to exercise       Hyperlipidemia LDL goal <100    LDL is at goal  Trig are high -suspect due to high glucose  Disc goals for lipids and reasons to control them Rev labs with pt Rev low sat fat diet in detail  On statin and diet  Adv to exercise to help raise HDL

## 2014-08-31 NOTE — Patient Instructions (Signed)
Blood pressure is good  Diabetes is way out of control- next step is referral to endocrinology (diabetes specialist)- let me know if/when you are ready to see someone  Keep getting your eye exams In cholesterol - triglycerides are high - likely from blood sugar being out of control  Continue current medicines  Try to eat a healthy diet and exercise   Follow up in 6 months with labs prior

## 2014-08-31 NOTE — Assessment & Plan Note (Signed)
Pt refuses further care for this but still wants to check A1C every 6 mo   (still cannot give a reason except "fear of insulin")- aware there are other tx options Voiced understanding of DM end organ damage poss- opthy/kidney/vasc/heart /infection risk and other  She will continue current med [facility] endocrinology referral any time - she just needs to call Lab Results  Component Value Date   HGBA1C 11.1* 08/23/2014   Also triglycerides in the 140s   Pt states she will continue to work on diet  Not very motivated to exercise

## 2014-08-31 NOTE — Progress Notes (Signed)
Pre visit review using our clinic review tool, if applicable. No additional management support is needed unless otherwise documented below in the visit note. 

## 2014-09-13 DIAGNOSIS — M25511 Pain in right shoulder: Secondary | ICD-10-CM | POA: Diagnosis not present

## 2014-10-25 DIAGNOSIS — M5432 Sciatica, left side: Secondary | ICD-10-CM | POA: Diagnosis not present

## 2014-10-25 DIAGNOSIS — M25511 Pain in right shoulder: Secondary | ICD-10-CM | POA: Diagnosis not present

## 2014-11-21 ENCOUNTER — Encounter: Payer: Self-pay | Admitting: Family Medicine

## 2014-11-21 DIAGNOSIS — E119 Type 2 diabetes mellitus without complications: Secondary | ICD-10-CM | POA: Diagnosis not present

## 2014-11-21 DIAGNOSIS — H43811 Vitreous degeneration, right eye: Secondary | ICD-10-CM | POA: Diagnosis not present

## 2014-11-21 LAB — HM DIABETES EYE EXAM

## 2015-01-23 ENCOUNTER — Other Ambulatory Visit: Payer: Self-pay | Admitting: Family Medicine

## 2015-02-28 ENCOUNTER — Other Ambulatory Visit (INDEPENDENT_AMBULATORY_CARE_PROVIDER_SITE_OTHER): Payer: Medicare Other

## 2015-02-28 DIAGNOSIS — IMO0002 Reserved for concepts with insufficient information to code with codable children: Secondary | ICD-10-CM

## 2015-02-28 DIAGNOSIS — E1165 Type 2 diabetes mellitus with hyperglycemia: Secondary | ICD-10-CM | POA: Diagnosis not present

## 2015-02-28 DIAGNOSIS — I10 Essential (primary) hypertension: Secondary | ICD-10-CM

## 2015-02-28 DIAGNOSIS — E785 Hyperlipidemia, unspecified: Secondary | ICD-10-CM

## 2015-02-28 LAB — LIPID PANEL
Cholesterol: 118 mg/dL (ref 0–200)
HDL: 30.8 mg/dL — ABNORMAL LOW (ref >39.00)
NonHDL: 87.36
Total CHOL/HDL Ratio: 4
Triglycerides: 390 mg/dL — ABNORMAL HIGH (ref 0.0–149.0)
VLDL: 78 mg/dL — AB (ref 0.0–40.0)

## 2015-02-28 LAB — COMPREHENSIVE METABOLIC PANEL
ALBUMIN: 4.2 g/dL (ref 3.5–5.2)
ALK PHOS: 52 U/L (ref 39–117)
ALT: 16 U/L (ref 0–35)
AST: 25 U/L (ref 0–37)
BUN: 21 mg/dL (ref 6–23)
CO2: 27 mEq/L (ref 19–32)
CREATININE: 0.95 mg/dL (ref 0.40–1.20)
Calcium: 10.3 mg/dL (ref 8.4–10.5)
Chloride: 100 mEq/L (ref 96–112)
GFR: 60.3 mL/min (ref >60.00)
Glucose, Bld: 263 mg/dL — ABNORMAL HIGH (ref 70–99)
Potassium: 5 mEq/L (ref 3.5–5.1)
SODIUM: 135 meq/L (ref 135–145)
TOTAL PROTEIN: 7.1 g/dL (ref 6.0–8.3)
Total Bilirubin: 0.3 mg/dL (ref 0.2–1.2)

## 2015-02-28 LAB — HEMOGLOBIN A1C: HEMOGLOBIN A1C: 10 % — AB (ref 4.6–6.5)

## 2015-02-28 LAB — LDL CHOLESTEROL, DIRECT: Direct LDL: 43 mg/dL

## 2015-03-03 ENCOUNTER — Ambulatory Visit (INDEPENDENT_AMBULATORY_CARE_PROVIDER_SITE_OTHER): Payer: Medicare Other | Admitting: Family Medicine

## 2015-03-03 ENCOUNTER — Encounter: Payer: Self-pay | Admitting: Family Medicine

## 2015-03-03 VITALS — BP 130/70 | HR 63 | Temp 97.4°F | Ht 61.0 in | Wt 121.5 lb

## 2015-03-03 DIAGNOSIS — E1165 Type 2 diabetes mellitus with hyperglycemia: Secondary | ICD-10-CM | POA: Diagnosis not present

## 2015-03-03 DIAGNOSIS — IMO0002 Reserved for concepts with insufficient information to code with codable children: Secondary | ICD-10-CM

## 2015-03-03 DIAGNOSIS — E782 Mixed hyperlipidemia: Secondary | ICD-10-CM

## 2015-03-03 DIAGNOSIS — I10 Essential (primary) hypertension: Secondary | ICD-10-CM | POA: Diagnosis not present

## 2015-03-03 DIAGNOSIS — E1169 Type 2 diabetes mellitus with other specified complication: Secondary | ICD-10-CM

## 2015-03-03 DIAGNOSIS — E11319 Type 2 diabetes mellitus with unspecified diabetic retinopathy without macular edema: Secondary | ICD-10-CM

## 2015-03-03 NOTE — Progress Notes (Signed)
Pre visit review using our clinic review tool, if applicable. No additional management support is needed unless otherwise documented below in the visit note. 

## 2015-03-03 NOTE — Progress Notes (Signed)
Subjective:    Patient ID: Stephanie [name], female    DOB: [birth year], 81 y.o.   MRN: [id number]  HPI Here for f/u of chronic health problems  Has had a good summer  Has been to the beach in July   Taking fair care of herslef   Wt is down 2 lb   bp is up on first check  today (was rushing to get here this am)  No cp or palpitations or headaches or edema  No side effects to medicines  BP Readings from Last 3 Encounters:  03/03/15 140/70  08/31/14 126/60  05/30/14 128/72     Diabetes Home sugar results -does not check them (refuses to currently) DM diet -working a little harder at it unless she eats out  Exercise -has walked a few times /is supposed to go to the gym (too busy)  Symptoms - no thirst or frequent urination  A1C last  Lab Results  Component Value Date   HGBA1C 10.0* 02/28/2015  this is down from 11.1  Came down a bit from better eating   No problems with medications -pt has refused any addnl tx (on glipizide and metformin)  Renal protection- is ace related  Last eye exam 11/15-has retinopathy - then again in the spring (did not have to do any proceedures)   Cholesterol  Lab Results  Component Value Date   CHOL 118 02/28/2015   CHOL 141 08/23/2014   CHOL 160 05/23/2014   Lab Results  Component Value Date   HDL 30.80* 02/28/2015   HDL 32.40* 08/23/2014   HDL 26.10* 05/23/2014   Lab Results  Component Value Date   LDLCALC 54 11/27/2010   LDLCALC 51 02/13/2010   LDLCALC 50 03/08/2009   Lab Results  Component Value Date   TRIG 390.0* 02/28/2015   TRIG * 08/23/2014    437.0 Triglyceride is over 400; calculations on Lipids are invalid.   TRIG 544.0* 05/23/2014   Lab Results  Component Value Date   CHOLHDL 4 02/28/2015   CHOLHDL 4 08/23/2014   CHOLHDL 6 05/23/2014   Lab Results  Component Value Date   LDLDIRECT 43.0 02/28/2015   LDLDIRECT 59.0 08/23/2014   LDLDIRECT 55.9 05/23/2014    As usual - great LDL  Low HDL  Triglycerides are  high but down a little bit with decrease in A1C     Review of Systems    Review of Systems  Constitutional: Negative for fever, appetite change, fatigue and unexpected weight change.  Eyes: Negative for pain and visual disturbance.  Respiratory: Negative for cough and shortness of breath.   Cardiovascular: Negative for cp or palpitations    Gastrointestinal: Negative for nausea, diarrhea and constipation.  Genitourinary: Negative for urgency and frequency.  Skin: Negative for pallor or rash   Neurological: Negative for weakness, light-headedness, numbness and headaches.  Hematological: Negative for adenopathy. Does not bruise/bleed easily.  Psychiatric/Behavioral: Negative for dysphoric mood. The patient is not nervous/anxious.      Objective:   Physical Exam  Constitutional: She appears well-developed and well-nourished. No distress.  Well appearing   HENT:  Head: Normocephalic and atraumatic.  Mouth/Throat: Oropharynx is clear and moist.  Eyes: Conjunctivae and EOM are normal. Pupils are equal, round, and reactive to light.  Neck: Normal range of motion. Neck supple. No JVD present. Carotid bruit is not present. No thyromegaly present.  Cardiovascular: Normal rate, regular rhythm, normal heart sounds and intact distal pulses.  Exam reveals no gallop.  Pulmonary/Chest: Effort normal and breath sounds normal. No respiratory distress. She has no wheezes. She has no rales.  No crackles  Abdominal: Soft. Bowel sounds are normal. She exhibits no distension, no abdominal bruit and no mass. There is no tenderness.  Musculoskeletal: She exhibits no edema.  Lymphadenopathy:    She has no cervical adenopathy.  Neurological: She is alert. She has normal reflexes.  Skin: Skin is warm and dry. No rash noted.  Psychiatric: She has a normal mood and affect.          Assessment & Plan:   Problem List Items Addressed This Visit    Combined hyperlipidemia associated with type 2 diabetes  mellitus    Low HDL and high triglycerides (poss due to high sugar) persist Pt will not exercise Disc fish oil/ eating fish to help the HDL  Disc goals for lipids and reasons to control them Rev labs with pt Rev low sat fat diet in detail  Continue lipitor 10       Essential hypertension - Primary    bp in fair control at this time  BP Readings from Last 1 Encounters:  03/03/15 130/70   No changes needed Disc lifstyle change with low sodium diet and exercise  Labs reviewed       Type 2 diabetes, uncontrolled, with retinopathy    Lab Results  Component Value Date   HGBA1C 10.0* 02/28/2015   This is slt improved maxed out with glipizide and metformin doses - pt still refuses to add any further med or change anything  Disc risk of end organ damage of uncontrolled DM (of note- already has retinopathy) - she voices understanding and still declines further treatment  Diet effort is fair  Exercise -not much/ pt states she has no interest  Again voiced understanding of poorly controlled DM  Still wants to check A1C every 6 mo  Sees opth every 6 mo also

## 2015-03-03 NOTE — Patient Instructions (Signed)
Keep working on diet and exercise  Diabetes was slightly improved but still not close to goal- if you change your mind about adding therapy let me know  Triglycerides are still high from blood sugars - but a bit improved  Take care of yourself  Keep up with regular eye exams  Follow up in about 6 months for annual exam with labs prior

## 2015-03-05 NOTE — Assessment & Plan Note (Signed)
Low HDL and high triglycerides (poss due to high sugar) persist Pt will not exercise Disc fish oil/ eating fish to help the HDL  Disc goals for lipids and reasons to control them Rev labs with pt Rev low sat fat diet in detail  Continue lipitor 10

## 2015-03-05 NOTE — Assessment & Plan Note (Signed)
bp in fair control at this time  BP Readings from Last 1 Encounters:  03/03/15 130/70   No changes needed Disc lifstyle change with low sodium diet and exercise  Labs reviewed

## 2015-03-05 NOTE — Assessment & Plan Note (Signed)
Lab Results  Component Value Date   HGBA1C 10.0* 02/28/2015   This is slt improved maxed out with glipizide and metformin doses - pt still refuses to add any further med or change anything  Disc risk of end organ damage of uncontrolled DM (of note- already has retinopathy) - Stephanie [name] voices understanding and still declines further treatment  Diet effort is fair  Exercise -not much/ pt states Stephanie [name] has no interest  Again voiced understanding of poorly controlled DM  Still wants to check A1C every 6 mo  Sees opth every 6 mo also

## 2015-04-10 DIAGNOSIS — R6889 Other general symptoms and signs: Secondary | ICD-10-CM | POA: Diagnosis not present

## 2015-04-18 ENCOUNTER — Encounter: Payer: Self-pay | Admitting: Family Medicine

## 2015-04-21 ENCOUNTER — Ambulatory Visit (INDEPENDENT_AMBULATORY_CARE_PROVIDER_SITE_OTHER): Payer: Medicare Other

## 2015-04-21 DIAGNOSIS — Z23 Encounter for immunization: Secondary | ICD-10-CM | POA: Diagnosis not present

## 2015-04-25 ENCOUNTER — Other Ambulatory Visit: Payer: Self-pay | Admitting: Family Medicine

## 2015-05-22 DIAGNOSIS — H2513 Age-related nuclear cataract, bilateral: Secondary | ICD-10-CM | POA: Diagnosis not present

## 2015-05-22 DIAGNOSIS — H52223 Regular astigmatism, bilateral: Secondary | ICD-10-CM | POA: Diagnosis not present

## 2015-05-22 DIAGNOSIS — H47011 Ischemic optic neuropathy, right eye: Secondary | ICD-10-CM | POA: Diagnosis not present

## 2015-05-22 DIAGNOSIS — H5203 Hypermetropia, bilateral: Secondary | ICD-10-CM | POA: Diagnosis not present

## 2015-05-22 DIAGNOSIS — E119 Type 2 diabetes mellitus without complications: Secondary | ICD-10-CM | POA: Diagnosis not present

## 2015-06-13 ENCOUNTER — Other Ambulatory Visit: Payer: Self-pay

## 2015-06-13 DIAGNOSIS — R739 Hyperglycemia, unspecified: Secondary | ICD-10-CM

## 2015-06-15 ENCOUNTER — Other Ambulatory Visit: Payer: Medicare Other

## 2015-06-29 LAB — HM DIABETES EYE EXAM

## 2015-06-30 ENCOUNTER — Other Ambulatory Visit: Payer: Self-pay | Admitting: Family Medicine

## 2015-08-28 ENCOUNTER — Telehealth: Payer: Self-pay | Admitting: Family Medicine

## 2015-08-28 DIAGNOSIS — E782 Mixed hyperlipidemia: Secondary | ICD-10-CM

## 2015-08-28 DIAGNOSIS — E1165 Type 2 diabetes mellitus with hyperglycemia: Secondary | ICD-10-CM

## 2015-08-28 DIAGNOSIS — I10 Essential (primary) hypertension: Secondary | ICD-10-CM

## 2015-08-28 DIAGNOSIS — E11319 Type 2 diabetes mellitus with unspecified diabetic retinopathy without macular edema: Secondary | ICD-10-CM

## 2015-08-28 DIAGNOSIS — E1169 Type 2 diabetes mellitus with other specified complication: Secondary | ICD-10-CM

## 2015-08-28 NOTE — Telephone Encounter (Signed)
-----   Message from Marchia Bond sent at 08/25/2015  4:02 PM EST ----- Regarding: Cpx labs Wed 2/15, need orders. Thanks! :-) Please order  future cpx labs for pt's upcoming lab appt. Thanks Aniceto Boss

## 2015-08-30 ENCOUNTER — Other Ambulatory Visit: Payer: Self-pay

## 2015-09-04 ENCOUNTER — Ambulatory Visit (INDEPENDENT_AMBULATORY_CARE_PROVIDER_SITE_OTHER): Payer: Medicare Other | Admitting: Family Medicine

## 2015-09-04 ENCOUNTER — Encounter: Payer: Self-pay | Admitting: Family Medicine

## 2015-09-04 VITALS — BP 146/78 | HR 60 | Temp 98.0°F | Ht 61.0 in | Wt 117.0 lb

## 2015-09-04 DIAGNOSIS — Z0000 Encounter for general adult medical examination without abnormal findings: Secondary | ICD-10-CM | POA: Diagnosis not present

## 2015-09-04 DIAGNOSIS — E782 Mixed hyperlipidemia: Secondary | ICD-10-CM

## 2015-09-04 DIAGNOSIS — Z1211 Encounter for screening for malignant neoplasm of colon: Secondary | ICD-10-CM

## 2015-09-04 DIAGNOSIS — E11319 Type 2 diabetes mellitus with unspecified diabetic retinopathy without macular edema: Secondary | ICD-10-CM

## 2015-09-04 DIAGNOSIS — E559 Vitamin D deficiency, unspecified: Secondary | ICD-10-CM | POA: Diagnosis not present

## 2015-09-04 DIAGNOSIS — E1351 Other specified diabetes mellitus with diabetic peripheral angiopathy without gangrene: Secondary | ICD-10-CM

## 2015-09-04 DIAGNOSIS — E1169 Type 2 diabetes mellitus with other specified complication: Secondary | ICD-10-CM | POA: Diagnosis not present

## 2015-09-04 DIAGNOSIS — E1165 Type 2 diabetes mellitus with hyperglycemia: Secondary | ICD-10-CM

## 2015-09-04 DIAGNOSIS — I10 Essential (primary) hypertension: Secondary | ICD-10-CM

## 2015-09-04 DIAGNOSIS — M8580 Other specified disorders of bone density and structure, unspecified site: Secondary | ICD-10-CM

## 2015-09-04 LAB — COMPREHENSIVE METABOLIC PANEL
ALT: 12 U/L (ref 0–35)
AST: 20 U/L (ref 0–37)
Albumin: 4.6 g/dL (ref 3.5–5.2)
Alkaline Phosphatase: 50 U/L (ref 39–117)
BILIRUBIN TOTAL: 0.5 mg/dL (ref 0.2–1.2)
BUN: 28 mg/dL — AB (ref 6–23)
CO2: 26 meq/L (ref 19–32)
CREATININE: 1.05 mg/dL (ref 0.40–1.20)
Calcium: 10.2 mg/dL (ref 8.4–10.5)
Chloride: 97 mEq/L (ref 96–112)
GFR: 53.65 mL/min — ABNORMAL LOW (ref >60.00)
Glucose, Bld: 249 mg/dL — ABNORMAL HIGH (ref 70–99)
Potassium: 4.6 mEq/L (ref 3.5–5.1)
Sodium: 133 mEq/L — ABNORMAL LOW (ref 135–145)
TOTAL PROTEIN: 7.6 g/dL (ref 6.0–8.3)

## 2015-09-04 LAB — VITAMIN D 25 HYDROXY (VIT D DEFICIENCY, FRACTURES): VITD: 22.55 ng/mL — ABNORMAL LOW (ref 30.00–100.00)

## 2015-09-04 LAB — LIPID PANEL
Cholesterol: 133 mg/dL (ref 0–200)
HDL: 36.6 mg/dL — AB (ref >39.00)
NONHDL: 96.69
Total CHOL/HDL Ratio: 4
Triglycerides: 358 mg/dL — ABNORMAL HIGH (ref 0.0–149.0)
VLDL: 71.6 mg/dL — AB (ref 0.0–40.0)

## 2015-09-04 LAB — CBC WITH DIFFERENTIAL/PLATELET
BASOS ABS: 0 10*3/uL (ref 0.0–0.1)
Basophils Relative: 0.3 % (ref 0.0–3.0)
EOS ABS: 0.3 10*3/uL (ref 0.0–0.7)
Eosinophils Relative: 2.4 % (ref 0.0–5.0)
HCT: 40 % (ref 36.0–46.0)
Hemoglobin: 13.6 g/dL (ref 12.0–15.0)
LYMPHS ABS: 5.7 10*3/uL — AB (ref 0.7–4.0)
LYMPHS PCT: 43.8 % (ref 12.0–46.0)
MCHC: 34 g/dL (ref 30.0–36.0)
MCV: 89.3 fl (ref 78.0–100.0)
MONOS PCT: 5.5 % (ref 3.0–12.0)
Monocytes Absolute: 0.7 10*3/uL (ref 0.1–1.0)
NEUTROS ABS: 6.2 10*3/uL (ref 1.4–7.7)
NEUTROS PCT: 48 % (ref 43.0–77.0)
PLATELETS: 252 10*3/uL (ref 150.0–400.0)
RBC: 4.47 Mil/uL (ref 3.87–5.11)
RDW: 12.9 % (ref 11.5–15.5)
WBC: 12.9 10*3/uL — ABNORMAL HIGH (ref 4.0–10.5)

## 2015-09-04 LAB — LDL CHOLESTEROL, DIRECT: Direct LDL: 54 mg/dL

## 2015-09-04 LAB — HEMOGLOBIN A1C: Hgb A1c MFr Bld: 10.7 % — ABNORMAL HIGH (ref 4.6–6.5)

## 2015-09-04 LAB — MICROALBUMIN / CREATININE URINE RATIO
CREATININE, U: 142.2 mg/dL
Microalb Creat Ratio: 3.2 mg/g (ref 0.0–30.0)
Microalb, Ur: 4.6 mg/dL — ABNORMAL HIGH (ref 0.0–1.9)

## 2015-09-04 LAB — TSH: TSH: 1.06 u[IU]/mL (ref 0.35–4.50)

## 2015-09-04 MED ORDER — PAROXETINE HCL 40 MG PO TABS: 40.0000 mg | ORAL_TABLET | Freq: Every day | ORAL | Status: DC

## 2015-09-04 MED ORDER — CHLORTHALIDONE 25 MG PO TABS: 25.0000 mg | ORAL_TABLET | Freq: Every day | ORAL | Status: DC

## 2015-09-04 MED ORDER — GLIPIZIDE ER 10 MG PO TB24: ORAL_TABLET | ORAL | Status: DC

## 2015-09-04 MED ORDER — METFORMIN HCL 1000 MG PO TABS: ORAL_TABLET | ORAL | Status: DC

## 2015-09-04 MED ORDER — GLUCOSE BLOOD VI STRP: ORAL_STRIP | Status: DC

## 2015-09-04 MED ORDER — ATORVASTATIN CALCIUM 10 MG PO TABS: 10.0000 mg | ORAL_TABLET | Freq: Every day | ORAL | Status: DC

## 2015-09-04 MED ORDER — METOPROLOL SUCCINATE ER 100 MG PO TB24: 100.0000 mg | ORAL_TABLET | Freq: Every day | ORAL | Status: DC

## 2015-09-04 NOTE — Progress Notes (Signed)
Subjective:    Patient ID: Stephanie [name], female    DOB: 1934-03-30, 82 y.o.   MRN: [id number]  HPI Here for annual medicare wellness visit as well as chronic/acute medical problems as well as annual preventative exam  I have personally reviewed the Medicare Annual Wellness questionnaire and have noted 1. The patient's medical and social history 2. Their use of alcohol, tobacco or illicit drugs 3. Their current medications and supplements 4. The patient's functional ability including ADL's, fall risks, home safety risks and hearing or visual             impairment. 5. Diet and physical activities 6. Evidence for depression or mood disorders  The patients weight, height, BMI have been recorded in the chart and visual acuity is per eye clinic.  I have made referrals, counseling and provided education to the patient based review of the above and I have provided the pt with a written personalized care plan for preventive services. Reviewed and updated provider list, see scanned forms.  See scanned forms.  Routine anticipatory guidance given to patient.  See health maintenance. Colon cancer screening will do ifob - 8/12 nl , never had a colonoscopy Breast cancer screening - 12/15 - due for one at the breast center/she will schedule Self breast exam-no lumps  Flu vaccine 10/16  Tetanus vaccine 6/07 - will be due in the summer  Pneumovax- had the 23 and had a bad local reaction - she declines a prevnar  Zoster vaccine- has not had one , is interested in it if her ins pays -will find out and let us know dexa -12/15 - osteopenia  One fall since last visit-tripped over something -did not break bones - [place]  No fractures Hx of low vit D level Not taking ca or D regularly   Advance directive- has a living will and power of attorney  Cognitive function addressed- see scanned forms- and if abnormal then additional documentation follows. -occ forgets names -  otherwise pretty good   PMH and SH reviewed  Meds, vitals, and allergies reviewed.   ROS: See HPI.  Otherwise negative.     Wt is down 4 lb with bmi of 22  Hx of low D   DM2 Lab Results  Component Value Date   HGBA1C 10.0* 02/28/2015  has retinopathy Also pvd  Does not want to see a specialist or take additional medicine     bp is up on first check today -but has not taken her medicines this am due to fasting  No cp or palpitations or headaches or edema  No side effects to medicines  BP Readings from Last 3 Encounters:  09/04/15 146/78  03/03/15 130/70  08/31/14 126/60     Cholesterol Lab Results  Component Value Date   CHOL 118 02/28/2015   HDL 30.80* 02/28/2015   LDLCALC 54 11/27/2010   LDLDIRECT 43.0 02/28/2015   TRIG 390.0* 02/28/2015   CHOLHDL 4 02/28/2015   lipitor and diet   More hair loss lately Some stress  Rash -red patches under her arms-itch/burns  Has tried aloe   Review of Systems Review of Systems  Constitutional: Negative for fever, appetite change, fatigue and unexpected weight change.  Eyes: Negative for pain and visual disturbance.  Respiratory: Negative for cough and shortness of breath.   Cardiovascular: Negative for cp or palpitations    Gastrointestinal: Negative for nausea, diarrhea and constipation.  Genitourinary: Negative for urgency and  frequency.  Skin: Negative for pallor or rash  pos for more hair loss lately pos for rash under arms  Neurological: Negative for weakness, light-headedness, numbness and headaches.  Hematological: Negative for adenopathy. Does not bruise/bleed easily.  Psychiatric/Behavioral: Negative for dysphoric mood. The patient is not nervous/anxious.  pos for stressors (son in the hospital after a stroke)- has had valve replacement        Objective:   Physical Exam  Constitutional: She appears well-developed and well-nourished. No distress.  Well appearing   HENT:  Head: Normocephalic and  atraumatic.  Right Ear: External ear normal.  Left Ear: External ear normal.  Mouth/Throat: Oropharynx is clear and moist.  Eyes: Conjunctivae and EOM are normal. Pupils are equal, round, and reactive to light. No scleral icterus.  Neck: Normal range of motion. Neck supple. No JVD present. Carotid bruit is not present. No thyromegaly present.  Cardiovascular: Normal rate, regular rhythm, normal heart sounds and intact distal pulses.  Exam reveals no gallop.   Pulmonary/Chest: Effort normal and breath sounds normal. No respiratory distress. She has no wheezes. She exhibits no tenderness.  Abdominal: Soft. Bowel sounds are normal. She exhibits no distension, no abdominal bruit and no mass. There is no tenderness.  Genitourinary: No breast swelling, tenderness, discharge or bleeding.  Breast exam: No mass, nodules, thickening, tenderness, bulging, retraction, inflamation, nipple discharge or skin changes noted.  No axillary or clavicular LA.      Musculoskeletal: Normal range of motion. She exhibits no edema or tenderness.  Lymphadenopathy:    She has no cervical adenopathy.  Neurological: She is alert. She has normal reflexes. No cranial nerve deficit. She exhibits normal muscle tone. Coordination normal.  Skin: Skin is warm and dry. No rash noted. No erythema. No pallor.  Fair complexion   Several patches (oval) of scale and erythema in axillae  One area with central clearing - resembling ringworm  Psychiatric: She has a normal mood and affect.          Assessment & Plan:   Problem List Items Addressed This Visit      Cardiovascular and Mediastinum   Essential hypertension    bp is up without medication today BP Readings from Last 1 Encounters:  09/04/15 146/78   No changes needed=will schedule f/u when she has taken her medicine  Disc lifstyle change with low sodium diet and exercise        Relevant Medications   atorvastatin (LIPITOR) 10 MG tablet   chlorthalidone  (HYGROTON) 25 MG tablet   metoprolol succinate (TOPROL-XL) 100 MG 24 hr tablet   Other Relevant Orders   CBC with Differential/Platelet (Completed)   Comprehensive metabolic panel (Completed)   TSH (Completed)   Lipid panel (Completed)   Peripheral vascular disease due to secondary diabetes mellitus ([place])    No changes in therapy or symptoms  Pt still refuses further tx of diabetes which is worrisome       Relevant Medications   atorvastatin (LIPITOR) 10 MG tablet   chlorthalidone (HYGROTON) 25 MG tablet   glipiZIDE (GLUCOTROL XL) 10 MG 24 hr tablet   metFORMIN (GLUCOPHAGE) 1000 MG tablet   metoprolol succinate (TOPROL-XL) 100 MG 24 hr tablet     Endocrine   Combined hyperlipidemia associated with type 2 diabetes mellitus ([place])    Disc goals for lipids and reasons to control them Rev labs with pt Rev low sat fat diet in detail  LDL at goal with atorvastatin  Trig high (due to dm ?)  and HDL low        Relevant Medications   atorvastatin (LIPITOR) 10 MG tablet   chlorthalidone (HYGROTON) 25 MG tablet   glipiZIDE (GLUCOTROL XL) 10 MG 24 hr tablet   metFORMIN (GLUCOPHAGE) 1000 MG tablet   metoprolol succinate (TOPROL-XL) 100 MG 24 hr tablet   Other Relevant Orders   Lipid panel (Completed)   Type 2 diabetes, uncontrolled, with retinopathy ([place])    Due for A1C Pt still declines any additional tx of DM2 even though it is poorly controlled Voiced understanding when disc poss of end organ damage from DM - in pt who already has retinopathy  She is open to watching A1C however Poor control/ she also does not check glucose readings or exercise      Relevant Medications   atorvastatin (LIPITOR) 10 MG tablet   glipiZIDE (GLUCOTROL XL) 10 MG 24 hr tablet   metFORMIN (GLUCOPHAGE) 1000 MG tablet   Other Relevant Orders   Hemoglobin A1c (Completed)   Microalbumin / creatinine urine ratio (Completed)     Musculoskeletal and Integument   Osteopenia    One fall and no fx  Dexa  12/15 Will re check at 2 y mark  Enc better compliance with ca and D        Other   Colon cancer screening    Pt has declined colonoscopy in the past  Given ifob stool kit       Relevant Orders   Fecal occult blood, imunochemical   Encounter for Medicare annual wellness exam - Primary    Reviewed health habits including diet and exercise and skin cancer prevention Reviewed appropriate screening tests for age  Also reviewed health mt list, fam hx and immunization status , as well as social and family history   See HPI Labs reviewed Follow up in 1-2 months for blood pressure (your pressure is high today)- make sure you take that medicine that day Labs today  Please do stool card for colon cancer screening  Don't forget to schedule your mammogram  You are due for a tetanus shot - see if you can get it at the pharmacy (see the handout I gave you) or the health dept If you are interested in a shingles/zoster vaccine - call your insurance to check on coverage,( you should not get it within 1 month of other vaccines) , then call us for a prescription  for it to take to a pharmacy that gives the shot , or make a nurse visit to get it here depending on your coverage      Routine general medical examination at a health care facility    Reviewed health habits including diet and exercise and skin cancer prevention Reviewed appropriate screening tests for age  Also reviewed health mt list, fam hx and immunization status , as well as social and family history   See HPI Labs reviewed Follow up in 1-2 months for blood pressure (your pressure is high today)- make sure you take that medicine that day Labs today  Please do stool card for colon cancer screening  Don't forget to schedule your mammogram  You are due for a tetanus shot - see if you can get it at the pharmacy (see the handout I gave you) or the health dept If you are interested in a shingles/zoster vaccine - call your insurance to  check on coverage,( you should not get it within 1 month of other vaccines) , then call us for a prescription  for it to take to a pharmacy that gives the shot , or make a nurse visit to get it here depending on your coverage      Vitamin D deficiency    Level today  Not compliant with dosing  Disc imp of this to bone and overall health       Relevant Orders   VITAMIN D 25 Hydroxy (Vit-D Deficiency, Fractures) (Completed)

## 2015-09-04 NOTE — Assessment & Plan Note (Signed)
bp is up without medication today BP Readings from Last 1 Encounters:  09/04/15 146/78   No changes needed=will schedule f/u when she has taken her medicine  Disc lifstyle change with low sodium diet and exercise

## 2015-09-04 NOTE — Assessment & Plan Note (Signed)
No changes in therapy or symptoms  Pt still refuses further tx of diabetes which is worrisome

## 2015-09-04 NOTE — Assessment & Plan Note (Signed)
Reviewed health habits including diet and exercise and skin cancer prevention Reviewed appropriate screening tests for age  Also reviewed health mt list, fam hx and immunization status , as well as social and family history   See HPI Labs reviewed Follow up in 1-2 months for blood pressure (your pressure is high today)- make sure you take that medicine that day Labs today  Please do stool card for colon cancer screening  Don't forget to schedule your mammogram  You are due for a tetanus shot - see if you can get it at the pharmacy (see the handout I gave you) or the health dept If you are interested in a shingles/zoster vaccine - call your insurance to check on coverage,( you should not get it within 1 month of other vaccines) , then call us for a prescription  for it to take to a pharmacy that gives the shot , or make a nurse visit to get it here depending on your coverage

## 2015-09-04 NOTE — Progress Notes (Signed)
Pre visit review using our clinic review tool, if applicable. No additional management support is needed unless otherwise documented below in the visit note. 

## 2015-09-04 NOTE — Assessment & Plan Note (Signed)
Level today  Not compliant with dosing  Disc imp of this to bone and overall health

## 2015-09-04 NOTE — Assessment & Plan Note (Signed)
Disc goals for lipids and reasons to control them Rev labs with pt Rev low sat fat diet in detail  LDL at goal with atorvastatin  Trig high (due to dm ?) and HDL low

## 2015-09-04 NOTE — Assessment & Plan Note (Signed)
One fall and no fx  Dexa 12/15 Will re check at 2 y mark  Enc better compliance with ca and D

## 2015-09-04 NOTE — Patient Instructions (Signed)
Follow up in 1-2 months for blood pressure (your pressure is high today)- make sure you take that medicine that day Labs today  Please do stool card for colon cancer screening  Don't forget to schedule your mammogram  You are due for a tetanus shot - see if you can get it at the pharmacy (see the handout I gave you) or the health dept If you are interested in a shingles/zoster vaccine - call your insurance to check on coverage,( you should not get it within 1 month of other vaccines) , then call us for a prescription  for it to take to a pharmacy that gives the shot , or make a nurse visit to get it here depending on your coverage   For the rash under your arms- try lotrimin af over the counter as directed (or tinactin or other antifungal agent) If no improvement in 2 weeks please let me know

## 2015-09-04 NOTE — Assessment & Plan Note (Signed)
Pt has declined colonoscopy in the past  Given ifob stool kit

## 2015-09-04 NOTE — Assessment & Plan Note (Signed)
Due for A1C Pt still declines any additional tx of DM2 even though it is poorly controlled Voiced understanding when disc poss of end organ damage from DM - in pt who already has retinopathy  She is open to watching A1C however Poor control/ she also does not check glucose readings or exercise

## 2015-09-06 ENCOUNTER — Other Ambulatory Visit: Payer: Self-pay | Admitting: Family Medicine

## 2015-09-06 MED ORDER — VITAMIN D (ERGOCALCIFEROL) 1.25 MG (50000 UNIT) PO CAPS: 50000.0000 [IU] | ORAL_CAPSULE | ORAL | Status: DC

## 2015-09-19 ENCOUNTER — Other Ambulatory Visit (INDEPENDENT_AMBULATORY_CARE_PROVIDER_SITE_OTHER): Payer: Medicare Other

## 2015-09-19 DIAGNOSIS — Z1211 Encounter for screening for malignant neoplasm of colon: Secondary | ICD-10-CM | POA: Diagnosis not present

## 2015-09-19 LAB — FECAL OCCULT BLOOD, GUAIAC: FECAL OCCULT BLD: NEGATIVE

## 2015-09-20 LAB — FECAL OCCULT BLOOD, IMMUNOCHEMICAL: FECAL OCCULT BLD: NEGATIVE

## 2015-09-21 ENCOUNTER — Encounter: Payer: Self-pay | Admitting: *Deleted

## 2015-10-30 ENCOUNTER — Ambulatory Visit: Payer: Self-pay | Admitting: Family Medicine

## 2015-11-10 ENCOUNTER — Encounter: Payer: Self-pay | Admitting: Family Medicine

## 2015-11-10 ENCOUNTER — Ambulatory Visit (INDEPENDENT_AMBULATORY_CARE_PROVIDER_SITE_OTHER): Payer: Medicare Other | Admitting: Family Medicine

## 2015-11-10 VITALS — BP 132/60 | HR 64 | Temp 97.8°F | Ht 61.0 in | Wt 118.8 lb

## 2015-11-10 DIAGNOSIS — I10 Essential (primary) hypertension: Secondary | ICD-10-CM | POA: Diagnosis not present

## 2015-11-10 DIAGNOSIS — M542 Cervicalgia: Secondary | ICD-10-CM | POA: Diagnosis not present

## 2015-11-10 MED ORDER — CYCLOBENZAPRINE HCL 10 MG PO TABS: 5.0000 mg | ORAL_TABLET | Freq: Every evening | ORAL | Status: DC | PRN

## 2015-11-10 NOTE — Progress Notes (Signed)
Pre visit review using our clinic review tool, if applicable. No additional management support is needed unless otherwise documented below in the visit note. 

## 2015-11-10 NOTE — Patient Instructions (Signed)
Use heat on your neck for 10 minutes at a time  Also massage  Use your good pillow Try the flexeril at night with caution of sedation If worse- alert me BP is better today

## 2015-11-10 NOTE — Progress Notes (Signed)
Subjective:    Patient ID: Stephanie [name], female    DOB: 1934/07/02, 82 y.o.   MRN: [id number]  HPI Here for HTN f/u and also neck pain   Last visit had not taken her bp med  BP Readings from Last 3 Encounters:  11/10/15 140/70  09/04/15 146/78  03/03/15 130/70    Takes chlorthalidone and toprol xl  Pulse is 64  Intol of ace and amlodipine   At home her bp is usually 120-130/60-70 No ha or cp or other symptoms   She has neck pain /stiffness that radiates into both shoulders After scrubbing walls Worse on the r side  No numbness Used some heat and also aleve -eases it a bit  Gradually improving but still bothersome   Has a special pillow that conforms to neck and head     Patient Active Problem List   Diagnosis Date Noted  . Vitamin D deficiency 09/04/2015  . Routine general medical examination at a health care facility 09/04/2015  . Encounter for Medicare annual wellness exam 05/30/2014  . Colon cancer screening 05/30/2014  . History of fall 05/26/2013  . Hand contusion 05/26/2013  . Gynecological examination 02/20/2011  . HYPERKALEMIA 03/06/2010  . HELICOBACTER PYLORI INFECTION 12/19/2006  . HSV 12/19/2006  . Type 2 diabetes, uncontrolled, with retinopathy ([place]) 12/19/2006  . DIABETIC  RETINOPATHY 12/19/2006  . Combined hyperlipidemia associated with type 2 diabetes mellitus ([place]) 12/19/2006  . ANXIETY 12/19/2006  . Essential hypertension 12/19/2006  . CAD 12/19/2006  . Peripheral vascular disease due to secondary diabetes mellitus ([place]) 12/19/2006  . ALLERGIC RHINITIS 12/19/2006  . OSTEOARTHRITIS 12/19/2006  . Osteopenia 12/19/2006   Past Medical History  Diagnosis Date  . Allergy     allergic rhinitis  . Anxiety   . Diabetes mellitus     type II  . Hyperlipidemia   . Hypertension   . Arthritis     osteoarthritis  . Osteopenia   . Peripheral vascular disease ([place])   . Degenerative disk disease     in neck  . Periodontal disease   .  Hypoaldosteronism ([place]) 6/12    from DM causing hyperkalemia    Past Surgical History  Procedure Laterality Date  . Cholecystectomy    . Tubal ligation    . Tonsillectomy    . Esophagogastroduodenoscopy  03/2004    gastritis by biopsy  . Gum surgery  06/2010    laser surgery for gums  . Mm breast stereo bx*l*r/s  12/12    normal   Social History  Substance Use Topics  . Smoking status: Never Smoker   . Smokeless tobacco: None  . Alcohol Use: No   Family History  Problem Relation Age of Onset  . Cancer Mother     vaginal CA in situ  . Hypertension Mother   . Heart disease Mother     CAD and MI  . Heart disease Father     CAD  . Diabetes Father   . Heart disease Son     congenital valve problem   Allergies  Allergen Reactions  . Ace Inhibitors     REACTION: generic, reaction not known  . Amlodipine Besy-Benazepril Hcl     REACTION: chest pain  . Clopidogrel Bisulfate     REACTION: GI  . Erythromycin     REACTION: GI upset   Current Outpatient Prescriptions on File Prior to Visit  Medication Sig Dispense Refill  . aspirin EC 81 MG EC tablet Take  81 mg by mouth daily.      Marland Kitchen atorvastatin (LIPITOR) 10 MG tablet Take 1 tablet (10 mg total) by mouth at bedtime. 90 tablet 3  . chlorthalidone (HYGROTON) 25 MG tablet Take 1 tablet (25 mg total) by mouth daily. 90 tablet 3  . diphenhydramine-acetaminophen (TYLENOL PM EXTRA STRENGTH) 25-500 MG TABS Take 1 tablet by mouth at bedtime as needed.      . famotidine (PEPCID) 10 MG tablet OTC as directed.     Marland Kitchen glipiZIDE (GLUCOTROL XL) 10 MG 24 hr tablet TAKE ONE TABLET BY MOUTH ONCE DAILY WITH  BREAKFAST 90 tablet 3  . glucose blood (ACCU-CHEK SMARTVIEW) test strip USE ONE STRIP TO TEST BLOOD SUGAR TWICE  DAILY FOR POORLY CONTROLLED dm TYPE 2 100 each 3  . metFORMIN (GLUCOPHAGE) 1000 MG tablet TAKE ONE TABLET BY MOUTH TWICE DAILY WITH  A  MEAL 180 tablet 3  . metoprolol succinate (TOPROL-XL) 100 MG 24 hr tablet Take 1 tablet (100  mg total) by mouth daily. Take with or immediately following a meal. 90 tablet 3  . PARoxetine (PAXIL) 40 MG tablet Take 1 tablet (40 mg total) by mouth daily. 90 tablet 3  . Vitamin D, Ergocalciferol, (DRISDOL) 50000 units CAPS capsule Take 1 capsule (50,000 Units total) by mouth every 7 (seven) days. 12 capsule 0   No current facility-administered medications on file prior to visit.    Review of Systems Review of Systems  Constitutional: Negative for fever, appetite change, fatigue and unexpected weight change.  Eyes: Negative for pain and visual disturbance.  Respiratory: Negative for cough and shortness of breath.   Cardiovascular: Negative for cp or palpitations    Gastrointestinal: Negative for nausea, diarrhea and constipation.  Genitourinary: Negative for urgency and frequency.  Skin: Negative for pallor or rash   MSK pos for neck pain  Neurological: Negative for weakness, light-headedness, numbness and headaches.  Hematological: Negative for adenopathy. Does not bruise/bleed easily.  Psychiatric/Behavioral: Negative for dysphoric mood. The patient is not nervous/anxious.         Objective:   Physical Exam  Constitutional: She appears well-developed and well-nourished. No distress.  Well appearing   HENT:  Head: Normocephalic and atraumatic.  Mouth/Throat: Oropharynx is clear and moist.  Eyes: Conjunctivae and EOM are normal. Pupils are equal, round, and reactive to light.  Neck: Normal range of motion. Neck supple. No JVD present. Carotid bruit is not present. No thyromegaly present.  Cardiovascular: Normal rate, regular rhythm, normal heart sounds and intact distal pulses.  Exam reveals no gallop.   Pulmonary/Chest: Effort normal and breath sounds normal. No respiratory distress. She has no wheezes. She has no rales.  No crackles  Abdominal: Soft. Bowel sounds are normal. She exhibits no distension, no abdominal bruit and no mass. There is no tenderness.  Musculoskeletal:  She exhibits no edema.       Cervical back: She exhibits tenderness and spasm. She exhibits normal range of motion, no bony tenderness and no swelling.  Tender in L para cervical musculature and trapezius Nl rom with pain on full rotation and tilt to L Some mild crepitus No bony tenderness  Lymphadenopathy:    She has no cervical adenopathy.  Neurological: She is alert. She has normal strength and normal reflexes. She displays no atrophy. No sensory deficit. She exhibits normal muscle tone.  Skin: Skin is warm and dry. No rash noted.  Psychiatric: She has a normal mood and affect.  Assessment & Plan:   Problem List Items Addressed This Visit      Cardiovascular and Mediastinum   Essential hypertension - Primary    bp in fair control at this time  BP Readings from Last 1 Encounters:  11/10/15 132/60   No changes needed Disc lifstyle change with low sodium diet and exercise  Improved on her medication         Other   Neck pain    Worse on R after overhead work  Exam consistent with muscle spasm and no neuro changes Reassuring exam Px flexeril for pm use Disc imp of good pillow support Continue heat prn  Consider films/ PT if needed

## 2015-11-12 NOTE — Assessment & Plan Note (Signed)
bp in fair control at this time  BP Readings from Last 1 Encounters:  11/10/15 132/60   No changes needed Disc lifstyle change with low sodium diet and exercise  Improved on her medication

## 2015-11-12 NOTE — Assessment & Plan Note (Signed)
Worse on R after overhead work  Exam consistent with muscle spasm and no neuro changes Reassuring exam Px flexeril for pm use Disc imp of good pillow support Continue heat prn  Consider films/ PT if needed

## 2015-11-21 DIAGNOSIS — H47011 Ischemic optic neuropathy, right eye: Secondary | ICD-10-CM | POA: Diagnosis not present

## 2015-11-21 DIAGNOSIS — E119 Type 2 diabetes mellitus without complications: Secondary | ICD-10-CM | POA: Diagnosis not present

## 2015-11-21 DIAGNOSIS — H2513 Age-related nuclear cataract, bilateral: Secondary | ICD-10-CM | POA: Diagnosis not present

## 2016-04-19 ENCOUNTER — Ambulatory Visit (INDEPENDENT_AMBULATORY_CARE_PROVIDER_SITE_OTHER): Payer: Medicare Other

## 2016-04-19 DIAGNOSIS — Z23 Encounter for immunization: Secondary | ICD-10-CM | POA: Diagnosis not present

## 2016-05-31 ENCOUNTER — Encounter: Payer: Self-pay | Admitting: Family Medicine

## 2016-06-10 LAB — HM DIABETES EYE EXAM

## 2016-08-11 ENCOUNTER — Other Ambulatory Visit: Payer: Self-pay | Admitting: Family Medicine

## 2016-08-13 ENCOUNTER — Ambulatory Visit (INDEPENDENT_AMBULATORY_CARE_PROVIDER_SITE_OTHER): Payer: Medicare Other | Admitting: Family Medicine

## 2016-08-13 VITALS — BP 122/62 | HR 111 | Temp 98.1°F | Ht 61.0 in | Wt 111.8 lb

## 2016-08-13 DIAGNOSIS — E1165 Type 2 diabetes mellitus with hyperglycemia: Secondary | ICD-10-CM | POA: Diagnosis not present

## 2016-08-13 DIAGNOSIS — A084 Viral intestinal infection, unspecified: Secondary | ICD-10-CM

## 2016-08-13 DIAGNOSIS — E11319 Type 2 diabetes mellitus with unspecified diabetic retinopathy without macular edema: Secondary | ICD-10-CM | POA: Diagnosis not present

## 2016-08-13 MED ORDER — PROMETHAZINE HCL 25 MG PO TABS: 12.5000 mg | ORAL_TABLET | Freq: Three times a day (TID) | ORAL | 0 refills | Status: DC | PRN

## 2016-08-13 NOTE — Progress Notes (Signed)
Subjective:    Patient ID: Stephanie [name], female    DOB: February 15, 1934, 83 y.o.   MRN: [id number]  HPI Here for n/v/d today   It started sat night- woke up with diarrhea  Then nausea and vomiting -at least twice  Sunday -did not eat all day , drank water and only kept down some of it  Vomiting and diarrhea at least 3-4 times   Felt a little better yesterday - then go up and moved a bit and became exhausted  Slept a lot - about 13 hours  Vomited one time yesterday and drank some broth and drank water  Also a little chicken and rice soup No food today  Drank some water  Just one watery stool today   Still feels queasy   No blood in her stool   Has a fever blister on L lower lip   Stays cold/ ? If fever   Temp: 98.1 F (36.7 C)    No known sick contacts   Last thing she ate was BBQ at a friend's house   Family ate BBQ and did not get sick   Wt Readings from Last 3 Encounters:  08/13/16 111 lb 12 oz (50.7 kg)  11/10/15 118 lb 12 oz (53.9 kg)  09/04/15 117 lb (53.1 kg)    No nausea med  Took a generic otc equate diarrhea med -  ? What it was   She did have abd pain Sunday and yesterday but that is better today   Patient Active Problem List   Diagnosis Date Noted  . Viral gastroenteritis 08/13/2016  . Neck pain 11/10/2015  . Vitamin D deficiency 09/04/2015  . Routine general medical examination at a health care facility 09/04/2015  . Encounter for Medicare annual wellness exam 05/30/2014  . Colon cancer screening 05/30/2014  . History of fall 05/26/2013  . Hand contusion 05/26/2013  . Gynecological examination 02/20/2011  . HYPERKALEMIA 03/06/2010  . HELICOBACTER PYLORI INFECTION 12/19/2006  . HSV 12/19/2006  . Type 2 diabetes, uncontrolled, with retinopathy ([place]) 12/19/2006  . DIABETIC  RETINOPATHY 12/19/2006  . Combined hyperlipidemia associated with type 2 diabetes mellitus ([place]) 12/19/2006  . ANXIETY 12/19/2006  . Essential hypertension 12/19/2006  .  CAD 12/19/2006  . Peripheral vascular disease due to secondary diabetes mellitus ([place]) 12/19/2006  . ALLERGIC RHINITIS 12/19/2006  . OSTEOARTHRITIS 12/19/2006  . Osteopenia 12/19/2006   Past Medical History:  Diagnosis Date  . Allergy    allergic rhinitis  . Anxiety   . Arthritis    osteoarthritis  . Degenerative disk disease    in neck  . Diabetes mellitus    type II  . Hyperlipidemia   . Hypertension   . Hypoaldosteronism ([place]) 6/12   from DM causing hyperkalemia   . Osteopenia   . Periodontal disease   . Peripheral vascular disease [facility])    Past Surgical History:  Procedure Laterality Date  . CHOLECYSTECTOMY    . ESOPHAGOGASTRODUODENOSCOPY  03/2004   gastritis by biopsy  . GUM SURGERY  06/2010   laser surgery for gums  . MM BREAST STEREO BX*L*R/S  12/12   normal  . TONSILLECTOMY    . TUBAL LIGATION     Social History  Substance Use Topics  . Smoking status: Never Smoker  . Smokeless tobacco: Not on file  . Alcohol use No   Family History  Problem Relation Age of Onset  . Cancer Mother     vaginal CA in situ  .  Hypertension Mother   . Heart disease Mother     CAD and MI  . Heart disease Father     CAD  . Diabetes Father   . Heart disease Son     congenital valve problem   Allergies  Allergen Reactions  . Ace Inhibitors     REACTION: generic, reaction not known  . Amlodipine Besy-Benazepril Hcl     REACTION: chest pain  . Clopidogrel Bisulfate     REACTION: GI  . Erythromycin     REACTION: GI upset   Current Outpatient Prescriptions on File Prior to Visit  Medication Sig Dispense Refill  . aspirin EC 81 MG EC tablet Take 81 mg by mouth daily.      Marland Kitchen atorvastatin (LIPITOR) 10 MG tablet Take 1 tablet (10 mg total) by mouth at bedtime. 90 tablet 3  . chlorthalidone (HYGROTON) 25 MG tablet Take 1 tablet (25 mg total) by mouth daily. 90 tablet 3  . diphenhydramine-acetaminophen (TYLENOL PM EXTRA STRENGTH) 25-500 MG TABS Take 1 tablet by mouth at  bedtime as needed.      . famotidine (PEPCID) 10 MG tablet OTC as directed.     Marland Kitchen glucose blood (ACCU-CHEK SMARTVIEW) test strip USE ONE STRIP TO TEST BLOOD SUGAR TWICE  DAILY FOR POORLY CONTROLLED dm TYPE 2 100 each 3  . metFORMIN (GLUCOPHAGE) 1000 MG tablet TAKE ONE TABLET BY MOUTH TWICE DAILY WITH  A  MEAL 180 tablet 3  . metoprolol succinate (TOPROL-XL) 100 MG 24 hr tablet Take 1 tablet (100 mg total) by mouth daily. Take with or immediately following a meal. 90 tablet 3  . PARoxetine (PAXIL) 40 MG tablet Take 1 tablet (40 mg total) by mouth daily. 90 tablet 3  . glipiZIDE (GLUCOTROL XL) 10 MG 24 hr tablet TAKE ONE TABLET BY MOUTH ONCE DAILY WITH  BREAKFAST 90 tablet 0   No current facility-administered medications on file prior to visit.     Review of Systems Review of Systems  Constitutional: Negative for fever, appetite change, fatigue and unexpected weight change.  Eyes: Negative for pain and visual disturbance.  Respiratory: Negative for cough and shortness of breath.   Cardiovascular: Negative for cp or palpitations    Gastrointestinal: Negative for constipation or blood in stool.  Genitourinary: Negative for urgency and frequency. neg for dysuria  Skin: Negative for pallor or rash   Neurological: Negative for weakness, light-headedness, numbness and headaches.  Hematological: Negative for adenopathy. Does not bruise/bleed easily.  Psychiatric/Behavioral: Negative for dysphoric mood. The patient is not nervous/anxious.         Objective:   Physical Exam  Constitutional: She appears well-developed and well-nourished. No distress.  Fatigued appearing   HENT:  Head: Normocephalic and atraumatic.  Mouth/Throat: Oropharynx is clear and moist.  MM are tachy but not entirely dry  Eyes: Conjunctivae and EOM are normal. Pupils are equal, round, and reactive to light. No scleral icterus.  Neck: Normal range of motion. Neck supple.  Cardiovascular: Regular rhythm and normal heart  sounds.   Pulmonary/Chest: Effort normal and breath sounds normal. No respiratory distress. She has no wheezes. She has no rales.  Abdominal: Soft. Bowel sounds are normal. She exhibits no distension and no mass. There is tenderness. There is no rebound and no guarding.  Very mild bilat LQ abd tenderness w/o rebound or guarding    Musculoskeletal: She exhibits no edema.  Lymphadenopathy:    She has no cervical adenopathy.  Neurological: She is alert.  Skin: Skin is warm and dry. No erythema. No pallor.  Brisk capillary refill  Nl skin turgor  Psychiatric: She has a normal mood and affect.          Assessment & Plan:   Problem List Items Addressed This Visit      Digestive   Viral gastroenteritis    Suspect poss noro virus Starting to improve Likely slightly dehydrated Inst given re: gradual re hydration and BRAT diet  Given px for phenergan to use as needed for nausea  Update if not starting to improve in several days or if worsening   Alert is if s/s of dehydration including dizziness  Handouts given         Endocrine   Type 2 diabetes, uncontrolled, with retinopathy ([place])    Pt has choosen not to treat aggressively or blood test more than once yearly  She is due for f/u - lab and f/u scheduled

## 2016-08-13 NOTE — Telephone Encounter (Signed)
You saw pt today for an acute appt, please advise

## 2016-08-13 NOTE — Assessment & Plan Note (Signed)
Suspect poss noro virus Starting to improve Likely slightly dehydrated Inst given re: gradual re hydration and BRAT diet  Given px for phenergan to use as needed for nausea  Update if not starting to improve in several days or if worsening   Alert is if s/s of dehydration including dizziness  Handouts given

## 2016-08-13 NOTE — Telephone Encounter (Signed)
Will refill electronically  

## 2016-08-13 NOTE — Progress Notes (Signed)
Pre visit review using our clinic review tool, if applicable. No additional management support is needed unless otherwise documented below in the visit note. 

## 2016-08-13 NOTE — Patient Instructions (Addendum)
I think you have a viral stomach bug (likely norovirus)  Glad it starting to get better  Do not overuse diarrhea medicine Try phenergan for nausea if needed- use caution of sedation  Get up very slowly- you are a bit dehydrated and may get dizzy easily  Keep resting  When you feel like eating - BRAT (bananas/rice/apple sauce and toast) are a good thing to start with -- very small amounts as tolerated  Fluids- small sips frequently to re hydrated (avoid carbonation)   Also watch out for low blood sugar- if you are not getting any calories it is ok to hold your diabetes medicines   Schedule labs and a follow up appt next month     Norovirus Infection A norovirus infection is caused by exposure to a virus in a group of similar viruses (noroviruses). This type of infection causes inflammation in your stomach and intestines (gastroenteritis). Norovirus is the most common cause of gastroenteritis. It also causes food poisoning. Anyone can get a norovirus infection. It spreads very easily (contagious). You can get it from contaminated food, water, surfaces, or other people. Norovirus is found in the stool or vomit of infected people. You can spread the infection as soon as you feel sick until 2 weeks after you recover.  Symptoms usually begin within 2 days after you become infected. Most norovirus symptoms affect the digestive system. CAUSES Norovirus infection is caused by contact with norovirus. You can catch norovirus if you:  Eat or drink something contaminated with norovirus.  Touch surfaces or objects contaminated with norovirus and then put your hand in your mouth.  Have direct contact with an infected person who has symptoms.  Share food, drink, or utensils with someone with who is sick with norovirus. SIGNS AND SYMPTOMS Symptoms of norovirus may include:  Nausea.  Vomiting.  Diarrhea.  Stomach cramps.  Fever.  Chills.  Headache.  Muscle  aches.  Tiredness. DIAGNOSIS Your health care provider may suspect norovirus based on your symptoms and physical exam. Your health care provider may also test a sample of your stool or vomit for the virus.  TREATMENT There is no specific treatment for norovirus. Most people get better without treatment in about 2 days. HOME CARE INSTRUCTIONS  Replace lost fluids by drinking plenty of water or rehydration fluids containing important minerals called electrolytes. This prevents dehydration. Drink enough fluid to keep your urine clear or pale yellow.  Do not prepare food for others while you are infected. Wait at least 3 days after recovering from the illness to do that. PREVENTION   Wash your hands often, especially after using the toilet or changing a diaper.  Wash fruits and vegetables thoroughly before preparing or serving them.  Throw out any food that a sick person may have touched.  Disinfect contaminated surfaces immediately after someone in the household has been sick. Use a bleach-based household cleaner.  Immediately remove and wash soiled clothes or sheets. SEEK MEDICAL CARE IF:  Your vomiting, diarrhea, and stomach pain is getting worse.  Your symptoms of norovirus do not go away after 2-3 days. SEEK IMMEDIATE MEDICAL CARE IF:  You develop symptoms of dehydration that do not improve with fluid replacement. This may include:  Excessive sleepiness.  Lack of tears.  Dry mouth.  Dizziness when standing.  Weak pulse. This information is not intended to replace advice given to you by your health care provider. Make sure you discuss any questions you have with your health care provider. Document  Released: 09/21/2002 Document Revised: 07/22/2014 Document Reviewed: 12/09/2013 Elsevier Interactive Patient Education  2017 [employer].

## 2016-08-14 NOTE — Assessment & Plan Note (Signed)
Pt has choosen not to treat aggressively or blood test more than once yearly  She is due for f/u - lab and f/u scheduled

## 2016-09-01 ENCOUNTER — Telehealth: Payer: Self-pay | Admitting: Family Medicine

## 2016-09-01 DIAGNOSIS — E1169 Type 2 diabetes mellitus with other specified complication: Secondary | ICD-10-CM

## 2016-09-01 DIAGNOSIS — E559 Vitamin D deficiency, unspecified: Secondary | ICD-10-CM

## 2016-09-01 DIAGNOSIS — E782 Mixed hyperlipidemia: Secondary | ICD-10-CM

## 2016-09-01 DIAGNOSIS — E1165 Type 2 diabetes mellitus with hyperglycemia: Secondary | ICD-10-CM

## 2016-09-01 DIAGNOSIS — I10 Essential (primary) hypertension: Secondary | ICD-10-CM

## 2016-09-01 DIAGNOSIS — E11319 Type 2 diabetes mellitus with unspecified diabetic retinopathy without macular edema: Secondary | ICD-10-CM

## 2016-09-01 NOTE — Telephone Encounter (Signed)
-----   Message from Ellamae Sia sent at 08/28/2016 12:13 PM EST ----- Regarding: Lab orders for Friday, 2.23.18 Lab orders for 1 month f/u

## 2016-09-05 ENCOUNTER — Other Ambulatory Visit (INDEPENDENT_AMBULATORY_CARE_PROVIDER_SITE_OTHER): Payer: Medicare Other

## 2016-09-05 DIAGNOSIS — E1165 Type 2 diabetes mellitus with hyperglycemia: Secondary | ICD-10-CM

## 2016-09-05 DIAGNOSIS — E782 Mixed hyperlipidemia: Secondary | ICD-10-CM

## 2016-09-05 DIAGNOSIS — I10 Essential (primary) hypertension: Secondary | ICD-10-CM

## 2016-09-05 DIAGNOSIS — E1169 Type 2 diabetes mellitus with other specified complication: Secondary | ICD-10-CM

## 2016-09-05 DIAGNOSIS — E11319 Type 2 diabetes mellitus with unspecified diabetic retinopathy without macular edema: Secondary | ICD-10-CM | POA: Diagnosis not present

## 2016-09-05 DIAGNOSIS — E559 Vitamin D deficiency, unspecified: Secondary | ICD-10-CM | POA: Diagnosis not present

## 2016-09-05 LAB — COMPREHENSIVE METABOLIC PANEL
ALT: 8 U/L (ref 0–35)
AST: 16 U/L (ref 0–37)
Albumin: 4.4 g/dL (ref 3.5–5.2)
Alkaline Phosphatase: 50 U/L (ref 39–117)
BUN: 21 mg/dL (ref 6–23)
CHLORIDE: 103 meq/L (ref 96–112)
CO2: 28 meq/L (ref 19–32)
Calcium: 9.7 mg/dL (ref 8.4–10.5)
Creatinine, Ser: 0.98 mg/dL (ref 0.40–1.20)
GFR: 57.95 mL/min — ABNORMAL LOW (ref >60.00)
GLUCOSE: 191 mg/dL — AB (ref 70–99)
POTASSIUM: 4.2 meq/L (ref 3.5–5.1)
Sodium: 138 mEq/L (ref 135–145)
Total Bilirubin: 0.4 mg/dL (ref 0.2–1.2)
Total Protein: 7.1 g/dL (ref 6.0–8.3)

## 2016-09-05 LAB — CBC WITH DIFFERENTIAL/PLATELET
BASOS PCT: 0.4 % (ref 0.0–3.0)
Basophils Absolute: 0 10*3/uL (ref 0.0–0.1)
EOS ABS: 0.3 10*3/uL (ref 0.0–0.7)
EOS PCT: 2.7 % (ref 0.0–5.0)
HCT: 37.5 % (ref 36.0–46.0)
HEMOGLOBIN: 12.7 g/dL (ref 12.0–15.0)
LYMPHS ABS: 4.4 10*3/uL — AB (ref 0.7–4.0)
Lymphocytes Relative: 45.7 % (ref 12.0–46.0)
MCHC: 33.9 g/dL (ref 30.0–36.0)
MCV: 88.8 fl (ref 78.0–100.0)
MONO ABS: 0.7 10*3/uL (ref 0.1–1.0)
Monocytes Relative: 7.6 % (ref 3.0–12.0)
NEUTROS ABS: 4.2 10*3/uL (ref 1.4–7.7)
NEUTROS PCT: 43.6 % (ref 43.0–77.0)
PLATELETS: 252 10*3/uL (ref 150.0–400.0)
RBC: 4.22 Mil/uL (ref 3.87–5.11)
RDW: 13 % (ref 11.5–15.5)
WBC: 9.6 10*3/uL (ref 4.0–10.5)

## 2016-09-05 LAB — LIPID PANEL
Cholesterol: 136 mg/dL (ref 0–200)
HDL: 35.5 mg/dL — AB (ref >39.00)
LDL CALC: 69 mg/dL (ref 0–99)
NONHDL: 100.21
Total CHOL/HDL Ratio: 4
Triglycerides: 154 mg/dL — ABNORMAL HIGH (ref 0.0–149.0)
VLDL: 30.8 mg/dL (ref 0.0–40.0)

## 2016-09-05 LAB — HEMOGLOBIN A1C: HEMOGLOBIN A1C: 8.2 % — AB (ref 4.6–6.5)

## 2016-09-05 LAB — MICROALBUMIN / CREATININE URINE RATIO
Creatinine,U: 88.6 mg/dL
MICROALB UR: 2.2 mg/dL — AB (ref 0.0–1.9)
Microalb Creat Ratio: 2.5 mg/g (ref 0.0–30.0)

## 2016-09-05 LAB — TSH: TSH: 1.58 u[IU]/mL (ref 0.35–4.50)

## 2016-09-05 LAB — VITAMIN D 25 HYDROXY (VIT D DEFICIENCY, FRACTURES): VITD: 18.62 ng/mL — ABNORMAL LOW (ref 30.00–100.00)

## 2016-09-05 NOTE — Addendum Note (Signed)
Addended by: Ellamae Sia on: 09/05/2016 12:29 PM   Modules accepted: Orders

## 2016-09-05 NOTE — Addendum Note (Signed)
Addended by: Ellamae Sia on: 09/05/2016 09:56 AM   Modules accepted: Orders

## 2016-09-06 ENCOUNTER — Other Ambulatory Visit: Payer: Medicare Other

## 2016-09-10 ENCOUNTER — Ambulatory Visit (INDEPENDENT_AMBULATORY_CARE_PROVIDER_SITE_OTHER): Payer: Medicare Other | Admitting: Family Medicine

## 2016-09-10 ENCOUNTER — Encounter: Payer: Self-pay | Admitting: Family Medicine

## 2016-09-10 VITALS — BP 138/70 | HR 61 | Temp 98.3°F | Ht 61.0 in | Wt 115.0 lb

## 2016-09-10 DIAGNOSIS — E782 Mixed hyperlipidemia: Secondary | ICD-10-CM | POA: Diagnosis not present

## 2016-09-10 DIAGNOSIS — E559 Vitamin D deficiency, unspecified: Secondary | ICD-10-CM | POA: Diagnosis not present

## 2016-09-10 DIAGNOSIS — E11319 Type 2 diabetes mellitus with unspecified diabetic retinopathy without macular edema: Secondary | ICD-10-CM

## 2016-09-10 DIAGNOSIS — E1351 Other specified diabetes mellitus with diabetic peripheral angiopathy without gangrene: Secondary | ICD-10-CM

## 2016-09-10 DIAGNOSIS — E1169 Type 2 diabetes mellitus with other specified complication: Secondary | ICD-10-CM | POA: Diagnosis not present

## 2016-09-10 DIAGNOSIS — I10 Essential (primary) hypertension: Secondary | ICD-10-CM | POA: Diagnosis not present

## 2016-09-10 DIAGNOSIS — E1165 Type 2 diabetes mellitus with hyperglycemia: Secondary | ICD-10-CM | POA: Diagnosis not present

## 2016-09-10 MED ORDER — GLUCOSE BLOOD VI STRP: ORAL_STRIP | 3 refills | Status: DC

## 2016-09-10 MED ORDER — ERGOCALCIFEROL 1.25 MG (50000 UT) PO CAPS: 50000.0000 [IU] | ORAL_CAPSULE | ORAL | 0 refills | Status: DC

## 2016-09-10 NOTE — Progress Notes (Signed)
Subjective:    Patient ID: Stephanie [name], female    DOB: Jan 11, 1934, 83 y.o.   MRN: [id number]  HPI Here for f/u of chronic health problems  Wt Readings from Last 3 Encounters:  09/10/16 115 lb (52.2 kg)  08/13/16 111 lb 12 oz (50.7 kg)  11/10/15 118 lb 12 oz (53.9 kg)   bmi is 21.7  bp is stable today  No cp or palpitations or headaches or edema  No side effects to medicines  BP Readings from Last 3 Encounters:  09/10/16 138/70  08/13/16 122/62  11/10/15 132/60     Diabetes Home sugar results = strips are out of date  DM diet - sticking to a diabetic diet/ she has cut out a lot of sweets and carbs  Exercise - housework , has not added walking yet-likes to go to the mall  Symptoms-none  A1C last  Lab Results  Component Value Date   HGBA1C 8.2 (H) 09/05/2016  this is down from 10.7  No problems with medications -on metformin and glipizide  Lab Results  Component Value Date   MICROALBUR 2.2 (H) 09/05/2016  this is improved from 4.6  Last eye exam  = goes twice yearly , no retinopathy the last check Last 3 mo ago improved with no retinopathy    Hx of hyperlipidemia Lab Results  Component Value Date   CHOL 136 09/05/2016   CHOL 133 09/04/2015   CHOL 118 02/28/2015   Lab Results  Component Value Date   HDL 35.50 (L) 09/05/2016   HDL 36.60 (L) 09/04/2015   HDL 30.80 (L) 02/28/2015   Lab Results  Component Value Date   LDLCALC 69 09/05/2016   LDLCALC 54 11/27/2010   LDLCALC 51 02/13/2010   Lab Results  Component Value Date   TRIG 154.0 (H) 09/05/2016   TRIG 358.0 (H) 09/04/2015   TRIG 390.0 (H) 02/28/2015   Lab Results  Component Value Date   CHOLHDL 4 09/05/2016   CHOLHDL 4 09/04/2015   CHOLHDL 4 02/28/2015   Lab Results  Component Value Date   LDLDIRECT 54.0 09/04/2015   LDLDIRECT 43.0 02/28/2015   LDLDIRECT 59.0 08/23/2014    On atorvastatin and diet  Trig improved with sugar  HDL is still low   Vit D is low at 18.6  She took one px  we gave her     Lab Results  Component Value Date   TSH 1.58 09/05/2016   Lab Results  Component Value Date   WBC 9.6 09/05/2016   HGB 12.7 09/05/2016   HCT 37.5 09/05/2016   MCV 88.8 09/05/2016   PLT 252.0 09/05/2016     Chemistry      Component Value Date/Time   NA 138 09/05/2016 0924   K 4.2 09/05/2016 0924   CL 103 09/05/2016 0924   CO2 28 09/05/2016 0924   BUN 21 09/05/2016 0924   CREATININE 0.98 09/05/2016 0924   CREATININE 0.88 11/27/2010 0916      Component Value Date/Time   CALCIUM 9.7 09/05/2016 0924   ALKPHOS 50 09/05/2016 0924   AST 16 09/05/2016 0924   ALT 8 09/05/2016 0924   BILITOT 0.4 09/05/2016 0924       Patient Active Problem List   Diagnosis Date Noted  . Neck pain 11/10/2015  . Vitamin D deficiency 09/04/2015  . Routine general medical examination at a health care facility 09/04/2015  . Encounter for Medicare annual wellness exam 05/30/2014  . Colon cancer screening 05/30/2014  .  History of fall 05/26/2013  . Hand contusion 05/26/2013  . Gynecological examination 02/20/2011  . HYPERKALEMIA 03/06/2010  . HELICOBACTER PYLORI INFECTION 12/19/2006  . HSV 12/19/2006  . Type 2 diabetes, uncontrolled, with retinopathy ([place]) 12/19/2006  . Diabetic retinopathy ([place]) 12/19/2006  . Combined hyperlipidemia associated with type 2 diabetes mellitus ([place]) 12/19/2006  . ANXIETY 12/19/2006  . Essential hypertension 12/19/2006  . CAD 12/19/2006  . Peripheral vascular disease due to secondary diabetes mellitus ([place]) 12/19/2006  . ALLERGIC RHINITIS 12/19/2006  . OSTEOARTHRITIS 12/19/2006  . Osteopenia 12/19/2006   Past Medical History:  Diagnosis Date  . Allergy    allergic rhinitis  . Anxiety   . Arthritis    osteoarthritis  . Degenerative disk disease    in neck  . Diabetes mellitus    type II  . Hyperlipidemia   . Hypertension   . Hypoaldosteronism ([place]) 6/12   from DM causing hyperkalemia   . Osteopenia   . Periodontal disease   .  Peripheral vascular disease [facility])    Past Surgical History:  Procedure Laterality Date  . CHOLECYSTECTOMY    . ESOPHAGOGASTRODUODENOSCOPY  03/2004   gastritis by biopsy  . GUM SURGERY  06/2010   laser surgery for gums  . MM BREAST STEREO BX*L*R/S  12/12   normal  . TONSILLECTOMY    . TUBAL LIGATION     Social History  Substance Use Topics  . Smoking status: Never Smoker  . Smokeless tobacco: Never Used  . Alcohol use No   Family History  Problem Relation Age of Onset  . Cancer Mother     vaginal CA in situ  . Hypertension Mother   . Heart disease Mother     CAD and MI  . Heart disease Father     CAD  . Diabetes Father   . Heart disease Son     congenital valve problem   Allergies  Allergen Reactions  . Ace Inhibitors     REACTION: generic, reaction not known  . Amlodipine Besy-Benazepril Hcl     REACTION: chest pain  . Clopidogrel Bisulfate     REACTION: GI  . Erythromycin     REACTION: GI upset   Current Outpatient Prescriptions on File Prior to Visit  Medication Sig Dispense Refill  . aspirin EC 81 MG EC tablet Take 81 mg by mouth daily.      Marland Kitchen atorvastatin (LIPITOR) 10 MG tablet Take 1 tablet (10 mg total) by mouth at bedtime. 90 tablet 3  . chlorthalidone (HYGROTON) 25 MG tablet Take 1 tablet (25 mg total) by mouth daily. 90 tablet 3  . diphenhydramine-acetaminophen (TYLENOL PM EXTRA STRENGTH) 25-500 MG TABS Take 1 tablet by mouth at bedtime as needed.      . famotidine (PEPCID) 10 MG tablet OTC as directed.     Marland Kitchen glipiZIDE (GLUCOTROL XL) 10 MG 24 hr tablet TAKE ONE TABLET BY MOUTH ONCE DAILY WITH  BREAKFAST 90 tablet 0  . metFORMIN (GLUCOPHAGE) 1000 MG tablet TAKE ONE TABLET BY MOUTH TWICE DAILY WITH  A  MEAL 180 tablet 3  . metoprolol succinate (TOPROL-XL) 100 MG 24 hr tablet Take 1 tablet (100 mg total) by mouth daily. Take with or immediately following a meal. 90 tablet 3  . PARoxetine (PAXIL) 40 MG tablet Take 1 tablet (40 mg total) by mouth daily. 90  tablet 3  . promethazine (PHENERGAN) 25 MG tablet Take 0.5-1 tablets (12.5-25 mg total) by mouth every 8 (eight) hours as needed  for nausea or vomiting. Caution of sedation 20 tablet 0   No current facility-administered medications on file prior to visit.     Review of Systems Review of Systems  Constitutional: Negative for fever, appetite change, fatigue and unexpected weight change.  Eyes: Negative for pain and visual disturbance.  Respiratory: Negative for cough and shortness of breath.   Cardiovascular: Negative for cp or palpitations    Gastrointestinal: Negative for nausea, diarrhea and constipation.  Genitourinary: Negative for urgency and frequency.  Skin: Negative for pallor or rash   Neurological: Negative for weakness, light-headedness, numbness and headaches.  Hematological: Negative for adenopathy. Does not bruise/bleed easily.  Psychiatric/Behavioral: Negative for dysphoric mood. The patient is not nervous/anxious.         Objective:   Physical Exam  Constitutional: She appears well-developed and well-nourished. No distress.  Well appearing   HENT:  Head: Normocephalic and atraumatic.  Mouth/Throat: Oropharynx is clear and moist.  Eyes: Conjunctivae and EOM are normal. Pupils are equal, round, and reactive to light.  Neck: Normal range of motion. Neck supple. No JVD present. Carotid bruit is not present. No thyromegaly present.  Cardiovascular: Normal rate, regular rhythm, normal heart sounds and intact distal pulses.  Exam reveals no gallop.   Pulmonary/Chest: Effort normal and breath sounds normal. No respiratory distress. She has no wheezes. She has no rales.  No crackles  Abdominal: Soft. Bowel sounds are normal. She exhibits no distension, no abdominal bruit and no mass. There is no tenderness.  Musculoskeletal: She exhibits no edema.  Lymphadenopathy:    She has no cervical adenopathy.  Neurological: She is alert. She has normal reflexes.  Skin: Skin is warm  and dry. No rash noted.  Psychiatric: She has a normal mood and affect.          Assessment & Plan:

## 2016-09-10 NOTE — Patient Instructions (Addendum)
Aim for an extra 30 minutes of exercise a day on top of regular activities such as walking  This will help your HDL (good cholesterol) and blood sugar control  We need to add medication for diabetes - please call your insurance company and get me a list of covered medications for diabetes (you tell me what tier you can afford) - we may consider a basal insulin or other medicine depending on what is covered   Vit D Is low  Take the px weekly pill for 12 weeks Also take 2000 iu of vit D over the counter daily - forever

## 2016-09-10 NOTE — Progress Notes (Signed)
Pre visit review using our clinic review tool, if applicable. No additional management support is needed unless otherwise documented below in the visit note. 

## 2016-09-12 NOTE — Assessment & Plan Note (Signed)
bp in fair control at this time  BP Readings from Last 1 Encounters:  09/10/16 138/70   No changes needed but did enc exercise to improve it further  Disc lifstyle change with low sodium diet and exercise

## 2016-09-12 NOTE — Assessment & Plan Note (Signed)
No changes

## 2016-09-12 NOTE — Assessment & Plan Note (Signed)
Pt states she did not have retinopathy on last check

## 2016-09-12 NOTE — Assessment & Plan Note (Addendum)
Lab Results  Component Value Date   HGBA1C 8.2 (H) 09/05/2016   On glipizide and metformin  Goal is to get below 7  I would like to consider basal insulin if possible She will get a list of covered alt from [facility]  Will plan f/u approx 3 mo  She had declined care in the past  microalb is elevated but improved

## 2016-09-12 NOTE — Assessment & Plan Note (Signed)
Disc goals for lipids and reasons to control them Rev labs with pt Rev low sat fat diet in detail Enc exercise for low HDL

## 2016-09-12 NOTE — Assessment & Plan Note (Signed)
18.6- low  Will px high dose D for 12 weeks weekly  Also 2000 iu daily indefinitely  Stressed imp to bone and overall health She had not been taking any

## 2016-11-05 ENCOUNTER — Telehealth: Payer: Self-pay | Admitting: Family Medicine

## 2016-11-05 NOTE — Telephone Encounter (Signed)
Patient Name: Stephanie [name] DOB: [birth year] Initial Comment Caller thinks she had a TIA last week,couldn't talk or get words out right, only lasted 5 min, no current symptoms Nurse Assessment Nurse: Ronnald Ramp, RN, Miranda Date/Time (Eastern Time): 11/05/2016 1:13:44 PM Confirm and document reason for call. If symptomatic, describe symptoms. ---Caller states last week she had an episode that last about 5 min when she was feeling dizzy, trouble speaking, and trouble concentrating. Does the patient have any new or worsening symptoms? ---Yes Will a triage be completed? ---Yes Related visit to physician within the last 2 weeks? ---No Does the PT have any chronic conditions? (i.e. diabetes, asthma, etc.) ---Yes List chronic conditions. ---Diabetes, HTN Is this a behavioral health or substance abuse call? ---No Guidelines Guideline Title Affirmed Question Affirmed Notes Neurologic Deficit [1] Numbness (i.e., loss of sensation) of the face, arm / hand, or leg / foot on one side of the body AND [2] sudden onset AND [3] brief (now gone) Final Disposition User Go to ED Now (or PCP triage) Ronnald Ramp, RN, Miranda Comments No appt available within recommended time frame with PCP or at primary office. Told caller that she should go to UC to be seen today. Caller declined. She states she just wants to make an appt with Dr. Glori Bickers. Told caller that I would enter a note in the chart and request a call back. Referrals REFERRED TO PCP OFFICE

## 2016-11-05 NOTE — Telephone Encounter (Signed)
I spoke with pt and today pt feels fine. Pt wants to schedule appt at [facility]. I spoke with pt and Dr Glori Bickers does not have available appt on 11/06/16; pt scheduled 30' appt with Dr Darnell Level at 11/06/16 at 8 AM. If pt condition changes or worsens tonight pt will got to ED.FYI to Dr Darnell Level.

## 2016-11-06 ENCOUNTER — Encounter: Payer: Self-pay | Admitting: Family Medicine

## 2016-11-06 ENCOUNTER — Ambulatory Visit (INDEPENDENT_AMBULATORY_CARE_PROVIDER_SITE_OTHER): Payer: Medicare Other | Admitting: Family Medicine

## 2016-11-06 ENCOUNTER — Ambulatory Visit (INDEPENDENT_AMBULATORY_CARE_PROVIDER_SITE_OTHER)
Admission: RE | Admit: 2016-11-06 | Discharge: 2016-11-06 | Disposition: A | Discharge status: Home / Self Care | Payer: Medicare Other | Source: Ambulatory Visit | Attending: Family Medicine | Admitting: Family Medicine

## 2016-11-06 ENCOUNTER — Encounter (INDEPENDENT_AMBULATORY_CARE_PROVIDER_SITE_OTHER): Payer: Self-pay

## 2016-11-06 VITALS — BP 144/74 | HR 60 | Temp 97.6°F | Wt 110.2 lb

## 2016-11-06 DIAGNOSIS — E11319 Type 2 diabetes mellitus with unspecified diabetic retinopathy without macular edema: Secondary | ICD-10-CM | POA: Diagnosis not present

## 2016-11-06 DIAGNOSIS — I10 Essential (primary) hypertension: Secondary | ICD-10-CM

## 2016-11-06 DIAGNOSIS — G459 Transient cerebral ischemic attack, unspecified: Secondary | ICD-10-CM | POA: Diagnosis not present

## 2016-11-06 DIAGNOSIS — R531 Weakness: Secondary | ICD-10-CM | POA: Diagnosis not present

## 2016-11-06 DIAGNOSIS — E1169 Type 2 diabetes mellitus with other specified complication: Secondary | ICD-10-CM | POA: Diagnosis not present

## 2016-11-06 DIAGNOSIS — E1165 Type 2 diabetes mellitus with hyperglycemia: Secondary | ICD-10-CM | POA: Diagnosis not present

## 2016-11-06 DIAGNOSIS — E782 Mixed hyperlipidemia: Secondary | ICD-10-CM

## 2016-11-06 HISTORY — DX: Transient cerebral ischemic attack, unspecified: G45.9

## 2016-11-06 MED ORDER — ASPIRIN-DIPYRIDAMOLE ER 25-200 MG PO CP12: 1.0000 | ORAL_CAPSULE | Freq: Two times a day (BID) | ORAL | 6 refills | Status: DC

## 2016-11-06 NOTE — Progress Notes (Signed)
BP (!) 144/74   Pulse 60   Temp 97.6 F (36.4 C) (Oral)   Wt 110 lb 4 oz (50 kg)   BMI 20.83 kg/m    CC: ?TIA Subjective:    Patient ID: Stephanie [name], female    DOB: 10/12/1933, 83 y.o.   MRN: [id number]  HPI: Stephanie [name] is a 83 y.o. female presenting on 11/06/2016 for ? TIA (1 week ago; facial week ago; couldn't talk; tongue issues---all lasted ~65min and then went away and haven't happened again)   She hasn't taken bp meds yet. Here with husband. Pleasant patient of Dr Glori Bickers, new to me, presents with episode 1 wk ago Wednesday evening where she had trouble getting words out. This was associated with dizziness and R hand numbness and episode lasted 5 minutes. Husband describes "mini-stroke" - slurred speech, R facial drooping. Fully resolved after 5 minutes, no trouble since. 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   During episode denies fevers, headache, dyspnea, chest pain or palpitations.   Known uncontrolled diabetes latest A1c 8.2% - discussing basal insulin with PCP. She does not check sugars. HLD well controlled on lipitor with last LDL 69. BP mildly elevated today - compliant with chlorthalidone, toprol XL.   No prior CVA.  She does take aspirin 81mg  daily. Prior did not tolerate plavix (after stents) Non smoker.  h/o CAD s/p stents.   Relevant past medical, surgical, family and social history reviewed and updated as indicated. Interim medical history since our last visit reviewed. Allergies and medications reviewed and updated. Outpatient Medications Prior to Visit  Medication Sig Dispense Refill  . atorvastatin (LIPITOR) 10 MG tablet Take 1 tablet (10 mg total) by mouth at bedtime. 90 tablet 3  . chlorthalidone (HYGROTON) 25 MG tablet Take 1 tablet (25 mg total) by mouth daily. 90 tablet 3  . diphenhydramine-acetaminophen (TYLENOL PM EXTRA STRENGTH) 25-500 MG TABS Take 1 tablet by mouth at bedtime as needed.      . ergocalciferol (VITAMIN D2) 50000 units  capsule Take 1 capsule (50,000 Units total) by mouth once a week. 12 capsule 0  . famotidine (PEPCID) 10 MG tablet OTC as directed.     Marland Kitchen glipiZIDE (GLUCOTROL XL) 10 MG 24 hr tablet TAKE ONE TABLET BY MOUTH ONCE DAILY WITH  BREAKFAST 90 tablet 0  . glucose blood (ACCU-CHEK SMARTVIEW) test strip USE ONE STRIP TO TEST BLOOD SUGAR TWICE  DAILY FOR POORLY CONTROLLED dm TYPE 2 100 each 3  . metFORMIN (GLUCOPHAGE) 1000 MG tablet TAKE ONE TABLET BY MOUTH TWICE DAILY WITH  A  MEAL 180 tablet 3  . metoprolol succinate (TOPROL-XL) 100 MG 24 hr tablet Take 1 tablet (100 mg total) by mouth daily. Take with or immediately following a meal. 90 tablet 3  . PARoxetine (PAXIL) 40 MG tablet Take 1 tablet (40 mg total) by mouth daily. 90 tablet 3  . promethazine (PHENERGAN) 25 MG tablet Take 0.5-1 tablets (12.5-25 mg total) by mouth every 8 (eight) hours as needed for nausea or vomiting. Caution of sedation 20 tablet 0  . aspirin EC 81 MG EC tablet Take 81 mg by mouth daily.       No facility-administered medications prior to visit.      Per HPI unless specifically indicated in ROS section below Review of Systems     Objective:    BP (!) 144/74   Pulse 60   Temp 97.6 F (36.4 C) (Oral)   Wt 110 lb 4 oz (  50 kg)   BMI 20.83 kg/m   Wt Readings from Last 3 Encounters:  11/06/16 110 lb 4 oz (50 kg)  09/10/16 115 lb (52.2 kg)  08/13/16 111 lb 12 oz (50.7 kg)    Physical Exam  Constitutional: She is oriented to person, place, and time. She appears well-developed and well-nourished. No distress.  HENT:  Head: Normocephalic and atraumatic.  Mouth/Throat: Oropharynx is clear and moist. No oropharyngeal exudate.  Cardiovascular: Normal rate, regular rhythm, normal heart sounds and intact distal pulses.   No murmur heard. Pulmonary/Chest: Effort normal and breath sounds normal. No respiratory distress. She has no wheezes. She has no rales.  Musculoskeletal: She exhibits no edema.  Neurological: She is alert  and oriented to person, place, and time. She has normal strength. No cranial nerve deficit or sensory deficit. She displays a negative Romberg sign. Coordination and gait normal.  Slightly unsteady with position changes CN 2-12 intact FTN intact EOMI  Skin: Skin is warm and dry. No rash noted.  Psychiatric: She has a normal mood and affect.  Nursing note and vitals reviewed.  Results for orders placed or performed in visit on 09/10/16  HM DIABETES EYE EXAM  Result Value Ref Range   HM Diabetic Eye Exam No Retinopathy No Retinopathy      Assessment & Plan:  Over 25 minutes were spent face-to-face with the patient during this encounter and >50% of that time was spent on counseling and coordination of care  Problem List Items Addressed This Visit    Combined hyperlipidemia associated with type 2 diabetes mellitus ([place])   Essential hypertension   Transient cerebral ischemia - Primary    Story consistent with TIA. No residual neurological deficits. Discussed this in detail with patient. Failed aspirin 81mg  - will change to aggrenox (pt intolerance to plavix). Strong risk factors for cerebrovascular disease. Will pursue stroke workup.  Discussed importance of good sugar, blood pressure, lipid control to help decrease future risk of stroke. Advised if recurrent episode like last week, needs to call EMS right away for emergent ER evaluation.       Relevant Orders   VAS US CAROTID   ECHOCARDIOGRAM COMPLETE   CT Head Wo Contrast   Type 2 diabetes, uncontrolled, with retinopathy ([place])       Follow up plan: Return if symptoms worsen or fail to improve.  Ria Bush, MD

## 2016-11-06 NOTE — Progress Notes (Signed)
Pre visit review using our clinic review tool, if applicable. No additional management support is needed unless otherwise documented below in the visit note. 

## 2016-11-06 NOTE — Assessment & Plan Note (Addendum)
Story consistent with TIA. No residual neurological deficits. Discussed this in detail with patient. Failed aspirin 81mg  - will change to aggrenox (pt intolerance to plavix). Strong risk factors for cerebrovascular disease. Will pursue stroke workup.  Discussed importance of good sugar, blood pressure, lipid control to help decrease future risk of stroke. Advised if recurrent episode like last week, needs to call EMS right away for emergent ER evaluation.

## 2016-11-06 NOTE — Patient Instructions (Addendum)
Start checking sugars more regularly. Stop aspirin, start aggrenox new blood thinner. See marion on your way out to schedule further imaging studies. Keep follow up appointment with Dr Glori Bickers. If any recurrent symptoms, seek ER care right away.    Ischemic Stroke An ischemic stroke (cerebrovascular accident, or CVA) is the sudden death of brain tissue that occurs when an area of the brain does not get enough oxygen. It is a medical emergency that must be treated right away. An ischemic stroke can cause permanent loss of brain function. This can cause problems with how different parts of your body function. What are the causes? This condition is caused by a decrease of oxygen supply to an area of the brain, which may be the result of:  A small blood clot (embolus) or a buildup of plaque in the blood vessels (atherosclerosis) that blocks blood flow in the brain.  An abnormal heart rhythm (atrial fibrillation).  A blocked or damaged artery in the head or neck. What increases the risk? Certain factors may make you more likely to develop this condition. Some of these factors are things that you can change, such as:  Obesity.  Smoking cigarettes.  Taking oral birth control, especially if you also use tobacco.  Physical inactivity.  Excessive alcohol use.  Use of illegal drugs, especially cocaine and methamphetamine. Other risk factors include:  High blood pressure (hypertension).  High cholesterol.  Diabetes mellitus.  Heart disease.  Being [place] American, Native American, Hispanic, or [place] Native.  Being over age 70.  Family history of stroke.  Previous history of blood clots, stroke, or transient ischemic attack (TIA).  Sickle cell disease.  Being a woman with a history of preeclampsia.  Migraine headache.  Sleep apnea.  Irregular heartbeats, such as atrial fibrillation.  Chronic inflammatory diseases, such as rheumatoid arthritis or lupus.  Blood clotting  disorders (hypercoagulable state). What are the signs or symptoms? Symptoms of this condition usually develop suddenly, or you may notice them after waking up from sleep. Symptoms may include sudden:  Weakness or numbness in your face, arm, or leg, especially on one side of your body.  Trouble walking or difficulty moving your arms or legs.  Loss of balance or coordination.  Confusion.  Slurred speech (dysarthria).  Trouble speaking, understanding speech, or both (aphasia).  Vision changes-such as double vision, blurred vision, or loss of vision-inone or both eyes.  Dizziness.  Nausea and vomiting.  Severe headache with no known cause. The headache is often described as the worst headache ever experienced. If possible, make note of the exact time that you last felt like your normal self and what time your symptoms started. Tell your health care provider. If symptoms come and go, this could be a sign of a warning stroke, or TIA. Get help right away, even if you feel better. How is this diagnosed? This condition may be diagnosed based on:  Your symptoms, your medical history, and a physical exam.  CT scan of the brain.  MRI.  CT angiogram. This test uses a computer to take X-rays of your arteries. A dye may be injected into your blood to show the inside of your blood vessels more clearly.  MRI angiogram. This is a type of MRI that is used to evaluate the blood vessels.  Cerebral angiogram. This test uses X-rays and a dye to show the blood vessels in the brain and neck. You may need to see a health care provider who specializes in stroke care.  A stroke specialist can be seen in person or through communication using telephone or television technology (telemedicine). Other tests may also be done to find the cause of the stroke, such as:  Electrocardiogram (ECG).  Continuous heart monitoring.  Echocardiogram.  Carotid ultrasound.  A scan of the brain circulation.  Blood  tests.  Sleep study to check for sleep apnea. How is this treated? Treatment for this condition will depend on the duration, severity, and cause of your symptoms and on the area of the brain affected. It is very important to get treatment at the first sign of stroke symptoms. Some treatments work better if they are done within 3-6 hours of the onset of stroke symptoms. These initial treatments may include:  Aspirin.  Medicines to control blood pressure.  Medicine given by injection to dissolve the blood clot (thrombolytic).  Treatments given directly to the affected artery to remove or dissolve the blood clot. Other treatment options may include:  Oxygen.  IV fluids.  Medicines to thin the blood (anticoagulants or antiplatelets).  Procedures to increase blood flow. Medicines and changes to your diet may be used to help treat and manage risk factors for stroke, such as diabetes, high cholesterol, and high blood pressure. After a stroke, you may work with physical, speech, mental health, or occupational therapists to help you recover. Follow these instructions at home: Medicines   Take over-the-counter and prescription medicines only as told by your health care provider.  If you were told to take a medicine to thin your blood, such as aspirin or an anticoagulant, take it exactly as told by your health care provider.  Taking too much blood-thinning medicine can cause bleeding.  If you do not take enough blood-thinning medicine, you will not have the protection that you need against another stroke and other problems.  Understand the side effects of taking anticoagulant medicine. When taking this type of medicine, make sure you:  Hold pressure over any cuts for longer than usual.  Tell your dentist and other health care providers that you are taking anticoagulants before you have any procedures that may cause bleeding.  Avoid activities that may cause trauma or injury. Eating and  drinking   Follow instructions from your health care provider about diet.  Eat healthy foods.  If your ability to swallow was affected by the stroke, you may need to take steps to avoid choking, such as:  Taking small bites when eating.  Eating foods that are soft or pureed. Safety   Follow instructions from your health care team about physical activity.  Use a walker or cane as told by your health care provider.  Take steps to create a safe home environment in order to reduce the risk of falls. This may include:  Having your home looked at by specialists.  Installing grab bars in the bedroom and bathroom.  Using safety equipment, such as raised toilets and a seat in the shower. General instructions   Do not use any tobacco products, such as cigarettes, chewing tobacco, and e-cigarettes. If you need help quitting, ask your health care provider.  Limit alcohol intake to no more than 1 drink a day for nonpregnant women and 2 drinks a day for men. One drink equals 12 oz of beer, 5 oz of wine, or 1 oz of hard liquor.  If you need help to stop using drugs or alcohol, ask your health care provider about a referral to a program or specialist.  Maintain an active and  healthy lifestyle. Get regular exercise as told by your health care provider.  Keep all follow-up visits as told by your health care provider, including visits with all specialists on your health care team. This is important. How is this prevented? Your risk of another stroke can be decreased by managing high blood pressure, high cholesterol, diabetes, heart disease, sleep apnea, and obesity. It can also be decreased by quitting smoking, limiting alcohol, and staying physically active. Your health care provider will continue to work with you on measures to prevent short-term and long-term complications of stroke. Get help right away if: You have:  Sudden weakness or numbness in your face, arm, or leg, especially on one  side of your body.  Sudden confusion.  Sudden trouble speaking, understanding, or both (aphasia).  Sudden trouble seeing with one or both eyes.  Sudden trouble walking or difficulty moving your arms or legs.  Sudden dizziness.  Sudden loss of balance or coordination.  Sudden, severe headache with no known cause.  A partial or total loss of consciousness.  A seizure. Any of these symptoms may represent a serious problem that is an emergency. Do not wait to see if the symptoms will go away. Get medical help right away. Call your local emergency services (911 in [place] Do not drive yourself to the hospital. This information is not intended to replace advice given to you by your health care provider. Make sure you discuss any questions you have with your health care provider. Document Released: 07/01/2005 Document Revised: 12/12/2015 Document Reviewed: 09/27/2015 Elsevier Interactive Patient Education  2017 [employer].

## 2016-11-12 ENCOUNTER — Other Ambulatory Visit: Payer: Self-pay | Admitting: Family Medicine

## 2016-11-12 HISTORY — PX: TRANSTHORACIC ECHOCARDIOGRAM: SHX275

## 2016-11-15 ENCOUNTER — Other Ambulatory Visit: Payer: Self-pay

## 2016-11-15 ENCOUNTER — Ambulatory Visit (HOSPITAL_COMMUNITY): Payer: Medicare Other | Attending: Cardiology

## 2016-11-15 DIAGNOSIS — G459 Transient cerebral ischemic attack, unspecified: Secondary | ICD-10-CM | POA: Insufficient documentation

## 2016-11-16 ENCOUNTER — Encounter: Payer: Self-pay | Admitting: Family Medicine

## 2016-11-18 ENCOUNTER — Ambulatory Visit (HOSPITAL_COMMUNITY)
Admission: RE | Admit: 2016-11-18 | Discharge: 2016-11-18 | Disposition: A | Discharge status: Home / Self Care | Payer: Medicare Other | Source: Ambulatory Visit | Attending: Cardiovascular Disease | Admitting: Cardiovascular Disease

## 2016-11-18 DIAGNOSIS — G459 Transient cerebral ischemic attack, unspecified: Secondary | ICD-10-CM | POA: Diagnosis not present

## 2016-11-18 DIAGNOSIS — I6523 Occlusion and stenosis of bilateral carotid arteries: Secondary | ICD-10-CM | POA: Diagnosis not present

## 2016-11-18 DIAGNOSIS — E1151 Type 2 diabetes mellitus with diabetic peripheral angiopathy without gangrene: Secondary | ICD-10-CM | POA: Insufficient documentation

## 2016-11-18 DIAGNOSIS — Z8673 Personal history of transient ischemic attack (TIA), and cerebral infarction without residual deficits: Secondary | ICD-10-CM | POA: Insufficient documentation

## 2016-11-18 DIAGNOSIS — I10 Essential (primary) hypertension: Secondary | ICD-10-CM | POA: Diagnosis not present

## 2016-11-18 DIAGNOSIS — I2510 Atherosclerotic heart disease of native coronary artery without angina pectoris: Secondary | ICD-10-CM | POA: Insufficient documentation

## 2016-11-18 DIAGNOSIS — E785 Hyperlipidemia, unspecified: Secondary | ICD-10-CM | POA: Diagnosis not present

## 2016-11-20 ENCOUNTER — Encounter: Payer: Self-pay | Admitting: Family Medicine

## 2016-11-20 DIAGNOSIS — I6523 Occlusion and stenosis of bilateral carotid arteries: Secondary | ICD-10-CM | POA: Insufficient documentation

## 2017-02-06 ENCOUNTER — Other Ambulatory Visit: Payer: Self-pay | Admitting: Family Medicine

## 2017-02-10 DIAGNOSIS — M4722 Other spondylosis with radiculopathy, cervical region: Secondary | ICD-10-CM | POA: Diagnosis not present

## 2017-02-10 DIAGNOSIS — I10 Essential (primary) hypertension: Secondary | ICD-10-CM | POA: Diagnosis not present

## 2017-02-11 ENCOUNTER — Ambulatory Visit (INDEPENDENT_AMBULATORY_CARE_PROVIDER_SITE_OTHER): Payer: Medicare Other | Admitting: Family Medicine

## 2017-02-11 ENCOUNTER — Encounter: Payer: Self-pay | Admitting: Family Medicine

## 2017-02-11 VITALS — BP 144/74 | HR 64 | Temp 98.2°F | Ht 61.0 in | Wt 113.2 lb

## 2017-02-11 DIAGNOSIS — G459 Transient cerebral ischemic attack, unspecified: Secondary | ICD-10-CM

## 2017-02-11 DIAGNOSIS — E1169 Type 2 diabetes mellitus with other specified complication: Secondary | ICD-10-CM

## 2017-02-11 DIAGNOSIS — E782 Mixed hyperlipidemia: Secondary | ICD-10-CM | POA: Diagnosis not present

## 2017-02-11 DIAGNOSIS — E11319 Type 2 diabetes mellitus with unspecified diabetic retinopathy without macular edema: Secondary | ICD-10-CM

## 2017-02-11 DIAGNOSIS — E559 Vitamin D deficiency, unspecified: Secondary | ICD-10-CM | POA: Diagnosis not present

## 2017-02-11 DIAGNOSIS — I10 Essential (primary) hypertension: Secondary | ICD-10-CM

## 2017-02-11 DIAGNOSIS — I6523 Occlusion and stenosis of bilateral carotid arteries: Secondary | ICD-10-CM | POA: Diagnosis not present

## 2017-02-11 DIAGNOSIS — E1165 Type 2 diabetes mellitus with hyperglycemia: Secondary | ICD-10-CM | POA: Diagnosis not present

## 2017-02-11 LAB — LIPID PANEL
CHOL/HDL RATIO: 5
Cholesterol: 166 mg/dL (ref 0–200)
HDL: 31.8 mg/dL — ABNORMAL LOW (ref >39.00)
LDL Cholesterol: 96 mg/dL (ref 0–99)
NONHDL: 134.33
Triglycerides: 193 mg/dL — ABNORMAL HIGH (ref 0.0–149.0)
VLDL: 38.6 mg/dL (ref 0.0–40.0)

## 2017-02-11 LAB — COMPREHENSIVE METABOLIC PANEL
ALT: 7 U/L (ref 0–35)
AST: 12 U/L (ref 0–37)
Albumin: 4.1 g/dL (ref 3.5–5.2)
Alkaline Phosphatase: 48 U/L (ref 39–117)
BUN: 23 mg/dL (ref 6–23)
CALCIUM: 9.7 mg/dL (ref 8.4–10.5)
CHLORIDE: 103 meq/L (ref 96–112)
CO2: 26 meq/L (ref 19–32)
Creatinine, Ser: 0.87 mg/dL (ref 0.40–1.20)
GFR: 66.42 mL/min (ref >60.00)
Glucose, Bld: 141 mg/dL — ABNORMAL HIGH (ref 70–99)
Potassium: 4.6 mEq/L (ref 3.5–5.1)
Sodium: 136 mEq/L (ref 135–145)
Total Bilirubin: 0.4 mg/dL (ref 0.2–1.2)
Total Protein: 6.7 g/dL (ref 6.0–8.3)

## 2017-02-11 LAB — HEMOGLOBIN A1C: Hgb A1c MFr Bld: 7 % — ABNORMAL HIGH (ref 4.6–6.5)

## 2017-02-11 MED ORDER — ERGOCALCIFEROL 1.25 MG (50000 UT) PO CAPS: 50000.0000 [IU] | ORAL_CAPSULE | ORAL | 0 refills | Status: DC

## 2017-02-11 MED ORDER — GLUCOSE BLOOD VI STRP: ORAL_STRIP | 3 refills | Status: DC

## 2017-02-11 MED ORDER — AMLODIPINE BESYLATE 5 MG PO TABS
5.0000 mg | ORAL_TABLET | Freq: Every day | ORAL | 11 refills | Status: DC
Start: 2017-02-11 — End: 2018-02-16

## 2017-02-11 NOTE — Patient Instructions (Addendum)
You need MRI and MRA of your brain - if your neurosurgeon has not ordered that let me know   We will refer you to a diabetes specialist   Blood pressure is high  Add amlodipine 5 mg once daily in am - alert me if any side effects   Lab today and then we will decide on folllow up    Try to stick to a diabetic diet the best you can

## 2017-02-11 NOTE — Progress Notes (Signed)
Subjective:    Patient ID: Stephanie [name], female    DOB: Jun 11, 1934, 83 y.o.   MRN: [id number]  HPI  Here for f/u of GI problems and also DM  [place] backwards a week ago Sunday - almost went through a glass cabinet  Husband thinks it was because her glucose was high  Was eating a lot of candy   Slurring of the words - comes and goes for minutes at a time  For about a week   Saw a neuro surgeon about her neck    Has been a year since she checked her blood sugar   Wt Readings from Last 3 Encounters:  02/11/17 113 lb 4 oz (51.4 kg)  11/06/16 110 lb 4 oz (50 kg)  09/10/16 115 lb (52.2 kg)   21.40 kg/m  BP Readings from Last 3 Encounters:  02/11/17 (!) 144/74  11/06/16 (!) 144/74  09/10/16 138/70  chlorthalidone  Metoprolol  Pulse Readings from Last 3 Encounters:  02/11/17 64  11/06/16 60  09/10/16 61      Diabetes Home sugar results  DM diet  Exercise  Symptoms A1C last  Lab Results  Component Value Date   HGBA1C 8.2 (H) 09/05/2016    No problems with medications  Renal protection Last eye exam    She had not been taking her vitamin D Thinks she accidentally threw away her strips or vit D  CT showed no strokes  Has MRI planned - with neurosurg (both head and neck)   2D echo- nl  Carotid doppler -mild plaque   She is taking the aggrenox twice daily   Cholesterol  Lab Results  Component Value Date   CHOL 136 09/05/2016   HDL 35.50 (L) 09/05/2016   LDLCALC 69 09/05/2016   LDLDIRECT 54.0 09/04/2015   TRIG 154.0 (H) 09/05/2016   CHOLHDL 4 09/05/2016  lipitor and diet   Patient Active Problem List   Diagnosis Date Noted  . Carotid stenosis, bilateral 11/20/2016  . Transient cerebral ischemia 11/06/2016  . Neck pain 11/10/2015  . Vitamin D deficiency 09/04/2015  . Routine general medical examination at a health care facility 09/04/2015  . Encounter for Medicare annual wellness exam 05/30/2014  . Colon cancer screening 05/30/2014  . History  of fall 05/26/2013  . Hand contusion 05/26/2013  . Gynecological examination 02/20/2011  . HYPERKALEMIA 03/06/2010  . HELICOBACTER PYLORI INFECTION 12/19/2006  . HSV 12/19/2006  . Type 2 diabetes, uncontrolled, with retinopathy ([place]) 12/19/2006  . Diabetic retinopathy ([place]) 12/19/2006  . Combined hyperlipidemia associated with type 2 diabetes mellitus ([place]) 12/19/2006  . ANXIETY 12/19/2006  . Essential hypertension 12/19/2006  . CAD 12/19/2006  . Peripheral vascular disease due to secondary diabetes mellitus ([place]) 12/19/2006  . ALLERGIC RHINITIS 12/19/2006  . OSTEOARTHRITIS 12/19/2006  . Osteopenia 12/19/2006   Past Medical History:  Diagnosis Date  . Allergy    allergic rhinitis  . Anxiety   . Arthritis    osteoarthritis  . Degenerative disk disease    in neck  . Diabetes mellitus    type II  . Hyperlipidemia   . Hypertension   . Hypoaldosteronism ([place]) 6/12   from DM causing hyperkalemia   . Osteopenia   . Periodontal disease   . Peripheral vascular disease ([place])   . Transient cerebral ischemia 11/06/2016   Past Surgical History:  Procedure Laterality Date  . CHOLECYSTECTOMY    . ESOPHAGOGASTRODUODENOSCOPY  03/2004   gastritis by biopsy  . GUM SURGERY  06/2010   laser surgery for gums  . MM BREAST STEREO BX*L*R/S  12/12   normal  . TONSILLECTOMY    . TRANSTHORACIC ECHOCARDIOGRAM  11/2016   EF 55-60%, grade 1 DD with elevated LV end diastolic filling pressures, mildly dilated LA, mild TR and PR.   . TUBAL LIGATION     Social History  Substance Use Topics  . Smoking status: Never Smoker  . Smokeless tobacco: Never Used  . Alcohol use No   Family History  Problem Relation Age of Onset  . Cancer Mother        vaginal CA in situ  . Hypertension Mother   . Heart disease Mother        CAD and MI  . Heart disease Father        CAD  . Diabetes Father   . Heart disease Son        congenital valve problem   Allergies  Allergen Reactions  . Ace Inhibitors      REACTION: generic, reaction not known  . Amlodipine Besy-Benazepril Hcl     REACTION: chest pain  . Clopidogrel Bisulfate     REACTION: GI  . Erythromycin     REACTION: GI upset   Current Outpatient Prescriptions on File Prior to Visit  Medication Sig Dispense Refill  . atorvastatin (LIPITOR) 10 MG tablet TAKE ONE TABLET BY MOUTH AT BEDTIME 90 tablet 0  . chlorthalidone (HYGROTON) 25 MG tablet TAKE ONE TABLET BY MOUTH ONCE DAILY 90 tablet 1  . diphenhydramine-acetaminophen (TYLENOL PM EXTRA STRENGTH) 25-500 MG TABS Take 1 tablet by mouth at bedtime as needed.      . dipyridamole-aspirin (AGGRENOX) 200-25 MG 12hr capsule Take 1 capsule by mouth 2 (two) times daily. 60 capsule 6  . famotidine (PEPCID) 10 MG tablet OTC as directed.     Marland Kitchen GLIPIZIDE XL 10 MG 24 hr tablet TAKE ONE TABLET BY MOUTH ONCE DAILY WITH BREAKFAST 90 tablet 1  . metFORMIN (GLUCOPHAGE) 1000 MG tablet TAKE ONE TABLET BY MOUTH TWICE DAILY WITH MEALS 180 tablet 1  . metoprolol succinate (TOPROL-XL) 100 MG 24 hr tablet TAKE ONE TABLET BY MOUTH ONCE DAILY. TAKE WITH OR IMMEDIATLY FOLLOWING A MEAL 90 tablet 1  . PARoxetine (PAXIL) 40 MG tablet TAKE ONE TABLET BY MOUTH ONCE DAILY 90 tablet 0  . promethazine (PHENERGAN) 25 MG tablet Take 0.5-1 tablets (12.5-25 mg total) by mouth every 8 (eight) hours as needed for nausea or vomiting. Caution of sedation 20 tablet 0   No current facility-administered medications on file prior to visit.     Review of Systems Review of Systems  Constitutional: Negative for fever, appetite change, fatigue and unexpected weight change.  Eyes: Negative for pain and visual disturbance.  Respiratory: Negative for cough and shortness of breath.   Cardiovascular: Negative for cp or palpitations    Gastrointestinal: Negative for nausea, diarrhea and constipation.  Genitourinary: Negative for urgency and frequency.  Skin: Negative for pallor or rash   Neurological: Negative for weakness,  light-headedness, numbness and headaches. pos for slowed speech Hematological: Negative for adenopathy. Does not bruise/bleed easily.  Psychiatric/Behavioral: Negative for dysphoric mood. The patient is not nervous/anxious.  pos for lack of motivation for self care and denial about medical problems        Objective:   Physical Exam  Constitutional: She appears well-developed and well-nourished. No distress.  Well appearing   HENT:  Head: Normocephalic and atraumatic.  Mouth/Throat: Oropharynx is clear and  moist.  Eyes: Pupils are equal, round, and reactive to light. Conjunctivae and EOM are normal.  Neck: Normal range of motion. Neck supple. No JVD present. Carotid bruit is not present. No thyromegaly present.  Cardiovascular: Normal rate, regular rhythm, normal heart sounds and intact distal pulses.  Exam reveals no gallop.   Pulmonary/Chest: Effort normal and breath sounds normal. No respiratory distress. She has no wheezes. She has no rales.  No crackles  Abdominal: Soft. Bowel sounds are normal. She exhibits no distension, no abdominal bruit and no mass. There is no tenderness.  Musculoskeletal: She exhibits no edema.  Lymphadenopathy:    She has no cervical adenopathy.  Neurological: She is alert. She has normal reflexes. No cranial nerve deficit. She exhibits normal muscle tone. Coordination normal.  No facial droop  Speech is noticeably slowed from baseline -perhaps slt slurred   (did not see pt for cva symptoms in April however when it was worse)  Skin: Skin is warm and dry. No rash noted.  Psychiatric: She has a normal mood and affect.          Assessment & Plan:   Problem List Items Addressed This Visit      Cardiovascular and Mediastinum   Carotid stenosis, bilateral    Reviewed with pt  Suspect she had TIA/CVA- MR of head ordered by neurosurg      Relevant Medications   amLODipine (NORVASC) 5 MG tablet   Essential hypertension - Primary    bp is not optimal    Adding amlodipine 5 mg daily  Update if problems or side effects  Lab today       Relevant Medications   amLODipine (NORVASC) 5 MG tablet   Other Relevant Orders   Lipid panel (Completed)   Comprehensive metabolic panel (Completed)   Transient cerebral ischemia    I do think pt's speech is not at baseline  Echo and carotid studies and CT re assuring  Agree with neurosurg plan for MR of brain  Continue aggrenox (tolerates well) Disc imp of cholesterol control (LDL under 70) and BP control  Also ref to endo for DM  Pt has been noncompliant/in denial of health issues for a while and husb enc her to change her mind about self care       Relevant Medications   amLODipine (NORVASC) 5 MG tablet     Endocrine   Combined hyperlipidemia associated with type 2 diabetes mellitus ([place])    On lipitor 10  Has had TIA /CVA  Goal LDL under 70 Disc goals for lipids and reasons to control them Rev labs with pt (last)-ordering today  Titrate statin if needed Rev low sat fat diet in detail       Relevant Medications   amLODipine (NORVASC) 5 MG tablet   Diabetic retinopathy ([place])    Continue opth f/u  Ref to endo for DM      Type 2 diabetes, uncontrolled, with retinopathy ([place])    Pt has been in continued denial with poor self care  Threw out her strips (we refilled today) On metformin and glipizide- refuses injections  Husband convinced her to stop eating sweets and let us ref to endocrinology  Ref made A1C drawn today       Relevant Orders   Ambulatory referral to Endocrinology   Lipid panel (Completed)   Hemoglobin A1c (Completed)     Other   Vitamin D deficiency    Pt threw out her vit D bottle Renewed this  so she can take the 12 week high dose course

## 2017-02-12 ENCOUNTER — Telehealth: Payer: Self-pay | Admitting: *Deleted

## 2017-02-12 MED ORDER — ATORVASTATIN CALCIUM 20 MG PO TABS: 20.0000 mg | ORAL_TABLET | Freq: Every day | ORAL | 3 refills | Status: DC

## 2017-02-12 NOTE — Assessment & Plan Note (Signed)
Pt has been in continued denial with poor self care  Threw out her strips (we refilled today) On metformin and glipizide- refuses injections  Husband convinced her to stop eating sweets and let us ref to endocrinology  Ref made A1C drawn today

## 2017-02-12 NOTE — Assessment & Plan Note (Signed)
On lipitor 10  Has had TIA /CVA  Goal LDL under 70 Disc goals for lipids and reasons to control them Rev labs with pt (last)-ordering today  Titrate statin if needed Rev low sat fat diet in detail

## 2017-02-12 NOTE — Assessment & Plan Note (Signed)
bp is not optimal  Adding amlodipine 5 mg daily  Update if problems or side effects  Lab today

## 2017-02-12 NOTE — Assessment & Plan Note (Signed)
Pt threw out her vit D bottle Renewed this so she can take the 12 week high dose course

## 2017-02-12 NOTE — Assessment & Plan Note (Signed)
I do think pt's speech is not at baseline  Echo and carotid studies and CT re assuring  Agree with neurosurg plan for MR of brain  Continue aggrenox (tolerates well) Disc imp of cholesterol control (LDL under 70) and BP control  Also ref to endo for DM  Pt has been noncompliant/in denial of health issues for a while and husb enc her to change her mind about self care

## 2017-02-12 NOTE — Telephone Encounter (Signed)
-----   Message from Abner Greenspan, MD sent at 02/11/2017  6:45 PM EDT ----- A1C is improved at 7.0  Keep working on diet and see Dr Cruzita Lederer as planned in sept  LDL cholesterol is 96 and goal is under 70 I would like to increase lipitor to 20 mg daily #30 11 ref (if agreeable, please send in)  Re check fasting lipid in 6 weeks please

## 2017-02-12 NOTE — Assessment & Plan Note (Signed)
Reviewed with pt  Suspect she had TIA/CVA- MR of head ordered by neurosurg

## 2017-02-12 NOTE — Telephone Encounter (Signed)
Spoke to spouse and advise him of lab results and Dr. Marliss Coots comments (per DPR), f/u appts scheduled and new dose of med was sent to pharmacy

## 2017-02-12 NOTE — Assessment & Plan Note (Signed)
Continue opth f/u  Ref to endo for DM

## 2017-02-17 ENCOUNTER — Other Ambulatory Visit: Payer: Self-pay

## 2017-02-17 MED ORDER — GLUCOSE BLOOD VI STRP: ORAL_STRIP | 12 refills | Status: DC

## 2017-03-11 DIAGNOSIS — M542 Cervicalgia: Secondary | ICD-10-CM | POA: Diagnosis not present

## 2017-03-11 DIAGNOSIS — M4722 Other spondylosis with radiculopathy, cervical region: Secondary | ICD-10-CM | POA: Diagnosis not present

## 2017-03-13 DIAGNOSIS — M4722 Other spondylosis with radiculopathy, cervical region: Secondary | ICD-10-CM | POA: Diagnosis not present

## 2017-03-20 DIAGNOSIS — M542 Cervicalgia: Secondary | ICD-10-CM | POA: Diagnosis not present

## 2017-03-20 DIAGNOSIS — M79601 Pain in right arm: Secondary | ICD-10-CM | POA: Diagnosis not present

## 2017-03-20 DIAGNOSIS — M549 Dorsalgia, unspecified: Secondary | ICD-10-CM | POA: Diagnosis not present

## 2017-03-20 DIAGNOSIS — M5412 Radiculopathy, cervical region: Secondary | ICD-10-CM | POA: Diagnosis not present

## 2017-03-21 DIAGNOSIS — M5412 Radiculopathy, cervical region: Secondary | ICD-10-CM | POA: Diagnosis not present

## 2017-03-21 DIAGNOSIS — M79601 Pain in right arm: Secondary | ICD-10-CM | POA: Diagnosis not present

## 2017-03-21 DIAGNOSIS — M542 Cervicalgia: Secondary | ICD-10-CM | POA: Diagnosis not present

## 2017-03-21 DIAGNOSIS — M549 Dorsalgia, unspecified: Secondary | ICD-10-CM | POA: Diagnosis not present

## 2017-03-24 DIAGNOSIS — M542 Cervicalgia: Secondary | ICD-10-CM | POA: Diagnosis not present

## 2017-03-24 DIAGNOSIS — M79601 Pain in right arm: Secondary | ICD-10-CM | POA: Diagnosis not present

## 2017-03-24 DIAGNOSIS — M549 Dorsalgia, unspecified: Secondary | ICD-10-CM | POA: Diagnosis not present

## 2017-03-24 DIAGNOSIS — M5412 Radiculopathy, cervical region: Secondary | ICD-10-CM | POA: Diagnosis not present

## 2017-03-25 DIAGNOSIS — M549 Dorsalgia, unspecified: Secondary | ICD-10-CM | POA: Diagnosis not present

## 2017-03-25 DIAGNOSIS — M542 Cervicalgia: Secondary | ICD-10-CM | POA: Diagnosis not present

## 2017-03-25 DIAGNOSIS — M5412 Radiculopathy, cervical region: Secondary | ICD-10-CM | POA: Diagnosis not present

## 2017-03-25 DIAGNOSIS — M79601 Pain in right arm: Secondary | ICD-10-CM | POA: Diagnosis not present

## 2017-03-26 ENCOUNTER — Other Ambulatory Visit (INDEPENDENT_AMBULATORY_CARE_PROVIDER_SITE_OTHER): Payer: Medicare Other

## 2017-03-26 DIAGNOSIS — E7889 Other lipoprotein metabolism disorders: Secondary | ICD-10-CM

## 2017-03-26 LAB — LIPID PANEL
CHOL/HDL RATIO: 2
Cholesterol: 103 mg/dL (ref 0–200)
HDL: 41.5 mg/dL (ref >39.00)
LDL CALC: 34 mg/dL (ref 0–99)
NONHDL: 61.23
Triglycerides: 138 mg/dL (ref 0.0–149.0)
VLDL: 27.6 mg/dL (ref 0.0–40.0)

## 2017-03-27 ENCOUNTER — Encounter: Payer: Self-pay | Admitting: *Deleted

## 2017-03-27 DIAGNOSIS — M79601 Pain in right arm: Secondary | ICD-10-CM | POA: Diagnosis not present

## 2017-03-27 DIAGNOSIS — M549 Dorsalgia, unspecified: Secondary | ICD-10-CM | POA: Diagnosis not present

## 2017-03-27 DIAGNOSIS — M5412 Radiculopathy, cervical region: Secondary | ICD-10-CM | POA: Diagnosis not present

## 2017-03-27 DIAGNOSIS — M542 Cervicalgia: Secondary | ICD-10-CM | POA: Diagnosis not present

## 2017-03-28 ENCOUNTER — Encounter: Payer: Self-pay | Admitting: Family Medicine

## 2017-03-28 ENCOUNTER — Ambulatory Visit (INDEPENDENT_AMBULATORY_CARE_PROVIDER_SITE_OTHER): Payer: Medicare Other | Admitting: Family Medicine

## 2017-03-28 ENCOUNTER — Ambulatory Visit (INDEPENDENT_AMBULATORY_CARE_PROVIDER_SITE_OTHER)
Admission: RE | Admit: 2017-03-28 | Discharge: 2017-03-28 | Disposition: A | Discharge status: Home / Self Care | Payer: Medicare Other | Source: Ambulatory Visit | Attending: Family Medicine | Admitting: Family Medicine

## 2017-03-28 VITALS — BP 138/64 | HR 60 | Temp 97.4°F | Ht 61.0 in | Wt 109.5 lb

## 2017-03-28 DIAGNOSIS — I10 Essential (primary) hypertension: Secondary | ICD-10-CM

## 2017-03-28 DIAGNOSIS — G44319 Acute post-traumatic headache, not intractable: Secondary | ICD-10-CM | POA: Diagnosis not present

## 2017-03-28 DIAGNOSIS — E1169 Type 2 diabetes mellitus with other specified complication: Secondary | ICD-10-CM

## 2017-03-28 DIAGNOSIS — S0990XA Unspecified injury of head, initial encounter: Secondary | ICD-10-CM | POA: Diagnosis not present

## 2017-03-28 DIAGNOSIS — G44309 Post-traumatic headache, unspecified, not intractable: Secondary | ICD-10-CM | POA: Insufficient documentation

## 2017-03-28 DIAGNOSIS — S0003XA Contusion of scalp, initial encounter: Secondary | ICD-10-CM | POA: Diagnosis not present

## 2017-03-28 DIAGNOSIS — E782 Mixed hyperlipidemia: Secondary | ICD-10-CM

## 2017-03-28 DIAGNOSIS — R21 Rash and other nonspecific skin eruption: Secondary | ICD-10-CM | POA: Diagnosis not present

## 2017-03-28 MED ORDER — TRIAMCINOLONE ACETONIDE 0.1 % EX CREA: 1.0000 "application " | TOPICAL_CREAM | Freq: Two times a day (BID) | CUTANEOUS | 0 refills | Status: DC

## 2017-03-28 NOTE — Patient Instructions (Addendum)
Wake your husband up when you have to go to the bathroom at night so you do not fall again  Or use a bed side commode   [place] a walker if you feel unsteady    Please don't skip meals   We will refer you for a CT scan for your head injury  If symptoms suddenly worsen-go to the ER   Take care of yourself   Blood pressure is improved and cholesterol as well   Try the triamcinolone cream for the rash  Alert [place] if it does not help   Switch soap to dove  Switch antiperspirant to one for "sensitive skin"

## 2017-03-28 NOTE — Progress Notes (Signed)
Subjective:    Patient ID: Stephanie [name], female    DOB: [birth year], 83 y.o.   MRN: [id number]  HPI Here for f/u of chronic health problems   She had a fall  Last Thursday got up in the middle of the night  Went to the bathroom and fell into a cabinet with a sharp edge  Was sitting on the toilet and fell back asleep  The fall woke her up  Thinks she hit the bridge of her nose  Then she went back to bed  Not a whole lot of pain / was glad she did not have her glasses on   Bruised in the face  Some headache - started yesterday , she was working at the table on bills and her eyes felt tired  Then later the R side of her head started hurting and she did not sleep last night   Has never happened before   Has a bedside commode - can use if she needs it  Started physical therapy  Seeing Dr Christella Noa  MRI showed C5-6 stenosis and spurs   No more episodes of TIA  She takes aggrenox     Wt Readings from Last 3 Encounters:  03/28/17 109 lb 8 oz (49.7 kg)  02/11/17 113 lb 4 oz (51.4 kg)  11/06/16 110 lb 4 oz (50 kg)  says she is eating good / not 3 meals per day  20.69 kg/m  She has an appt with Dr Cruzita Lederer Jaclyn Prime for her DM at the end of the month Glucose is better 118  Eating a lot better with no sweets  bp is stable today  No cp or palpitations or headaches or edema  No side effects to medicines  BP Readings from Last 3 Encounters:  03/28/17 138/64  02/11/17 (!) 144/74  11/06/16 (!) 144/74     Last visit we added amlodipine 5 mg  Also on chlorthalidone and metoprolol  Has had a rash around arms /arm pits Scaly /pink and itchy    Lab Results  Component Value Date   CREATININE 0.87 02/11/2017   BUN 23 02/11/2017   NA 136 02/11/2017   K 4.6 02/11/2017   CL 103 02/11/2017   CO2 26 02/11/2017   Hyperlipidemia Lab Results  Component Value Date   CHOL 103 03/26/2017   CHOL 166 02/11/2017   CHOL 136 09/05/2016   Lab Results  Component Value Date   HDL  41.50 03/26/2017   HDL 31.80 (L) 02/11/2017   HDL 35.50 (L) 09/05/2016   Lab Results  Component Value Date   LDLCALC 34 03/26/2017   LDLCALC 96 02/11/2017   LDLCALC 69 09/05/2016   Lab Results  Component Value Date   TRIG 138.0 03/26/2017   TRIG 193.0 (H) 02/11/2017   TRIG 154.0 (H) 09/05/2016   Lab Results  Component Value Date   CHOLHDL 2 03/26/2017   CHOLHDL 5 02/11/2017   CHOLHDL 4 09/05/2016   Lab Results  Component Value Date   LDLDIRECT 54.0 09/04/2015   LDLDIRECT 43.0 02/28/2015   LDLDIRECT 59.0 08/23/2014   On atorvastatin 10 mg  Goal is LDL under 70   Patient Active Problem List   Diagnosis Date Noted  . Head injury, closed, initial encounter 03/28/2017  . Rash and nonspecific skin eruption 03/28/2017  . Post-traumatic headache 03/28/2017  . Carotid stenosis, bilateral 11/20/2016  . Transient cerebral ischemia 11/06/2016  . Neck pain 11/10/2015  . Vitamin D deficiency 09/04/2015  . Routine  general medical examination at a health care facility 09/04/2015  . Encounter for Medicare annual wellness exam 05/30/2014  . Colon cancer screening 05/30/2014  . History of fall 05/26/2013  . Gynecological examination 02/20/2011  . HYPERKALEMIA 03/06/2010  . HELICOBACTER PYLORI INFECTION 12/19/2006  . HSV 12/19/2006  . Type 2 diabetes, uncontrolled, with retinopathy ([place]) 12/19/2006  . Diabetic retinopathy ([place]) 12/19/2006  . Combined hyperlipidemia associated with type 2 diabetes mellitus ([place]) 12/19/2006  . ANXIETY 12/19/2006  . Essential hypertension 12/19/2006  . CAD 12/19/2006  . Peripheral vascular disease due to secondary diabetes mellitus ([place]) 12/19/2006  . ALLERGIC RHINITIS 12/19/2006  . OSTEOARTHRITIS 12/19/2006  . Osteopenia 12/19/2006   Past Medical History:  Diagnosis Date  . Allergy    allergic rhinitis  . Anxiety   . Arthritis    osteoarthritis  . Degenerative disk disease    in neck  . Diabetes mellitus    type II  . Hyperlipidemia     . Hypertension   . Hypoaldosteronism ([place]) 6/12   from DM causing hyperkalemia   . Osteopenia   . Periodontal disease   . Peripheral vascular disease ([place])   . Transient cerebral ischemia 11/06/2016   Past Surgical History:  Procedure Laterality Date  . CHOLECYSTECTOMY    . ESOPHAGOGASTRODUODENOSCOPY  03/2004   gastritis by biopsy  . GUM SURGERY  06/2010   laser surgery for gums  . MM BREAST STEREO BX*L*R/S  12/12   normal  . TONSILLECTOMY    . TRANSTHORACIC ECHOCARDIOGRAM  11/2016   EF 55-60%, grade 1 DD with elevated LV end diastolic filling pressures, mildly dilated LA, mild TR and PR.   . TUBAL LIGATION     Social History  Substance Use Topics  . Smoking status: Never Smoker  . Smokeless tobacco: Never Used  . Alcohol use No   Family History  Problem Relation Age of Onset  . Cancer Mother        vaginal CA in situ  . Hypertension Mother   . Heart disease Mother        CAD and MI  . Heart disease Father        CAD  . Diabetes Father   . Heart disease Son        congenital valve problem   Allergies  Allergen Reactions  . Ace Inhibitors     REACTION: generic, reaction not known  . Amlodipine Besy-Benazepril Hcl     REACTION: chest pain  . Clopidogrel Bisulfate     REACTION: GI  . Erythromycin     REACTION: GI upset   Current Outpatient Prescriptions on File Prior to Visit  Medication Sig Dispense Refill  . amLODipine (NORVASC) 5 MG tablet Take 1 tablet (5 mg total) by mouth daily. 30 tablet 11  . atorvastatin (LIPITOR) 20 MG tablet Take 1 tablet (20 mg total) by mouth daily. 90 tablet 3  . chlorthalidone (HYGROTON) 25 MG tablet TAKE ONE TABLET BY MOUTH ONCE DAILY 90 tablet 1  . diphenhydramine-acetaminophen (TYLENOL PM EXTRA STRENGTH) 25-500 MG TABS Take 1 tablet by mouth at bedtime as needed.      . dipyridamole-aspirin (AGGRENOX) 200-25 MG 12hr capsule Take 1 capsule by mouth 2 (two) times daily. 60 capsule 6  . ergocalciferol (VITAMIN D2) 50000 units  capsule Take 1 capsule (50,000 Units total) by mouth once a week. 12 capsule 0  . famotidine (PEPCID) 10 MG tablet OTC as directed.     Marland Kitchen GLIPIZIDE XL 10 MG  24 hr tablet TAKE ONE TABLET BY MOUTH ONCE DAILY WITH BREAKFAST 90 tablet 1  . glucose blood (ACCU-CHEK SMARTVIEW) test strip USE ONE STRIP TO TEST BLOOD SUGAR TWICE  DAILY FOR POORLY CONTROLLED dm TYPE 2 100 each 3  . glucose blood (FREESTYLE TEST STRIPS) test strip Use as instructed 100 each 12  . metFORMIN (GLUCOPHAGE) 1000 MG tablet TAKE ONE TABLET BY MOUTH TWICE DAILY WITH MEALS 180 tablet 1  . metoprolol succinate (TOPROL-XL) 100 MG 24 hr tablet TAKE ONE TABLET BY MOUTH ONCE DAILY. TAKE WITH OR IMMEDIATLY FOLLOWING A MEAL 90 tablet 1  . PARoxetine (PAXIL) 40 MG tablet TAKE ONE TABLET BY MOUTH ONCE DAILY 90 tablet 0  . promethazine (PHENERGAN) 25 MG tablet Take 0.5-1 tablets (12.5-25 mg total) by mouth every 8 (eight) hours as needed for nausea or vomiting. Caution of sedation 20 tablet 0   No current facility-administered medications on file prior to visit.     Review of Systems  Constitutional: Negative for activity change, appetite change, fatigue, fever and unexpected weight change.  HENT: Negative for congestion, ear pain, rhinorrhea, sinus pressure and sore throat.   Eyes: Negative for pain, redness and visual disturbance.  Respiratory: Negative for cough, shortness of breath and wheezing.   Cardiovascular: Negative for chest pain and palpitations.  Gastrointestinal: Negative for abdominal pain, blood in stool, constipation and diarrhea.  Endocrine: Negative for polydipsia and polyuria.  Genitourinary: Negative for dysuria, frequency and urgency.  Musculoskeletal: Negative for arthralgias, back pain and myalgias.  Skin: Negative for pallor and rash.  Allergic/Immunologic: Negative for environmental allergies.  Neurological: Positive for headaches. Negative for dizziness, seizures, syncope and weakness.  Hematological: Negative  for adenopathy. Does not bruise/bleed easily.  Psychiatric/Behavioral: Negative for decreased concentration and dysphoric mood. The patient is not nervous/anxious.        Objective:   Physical Exam  Constitutional: She is oriented to person, place, and time. She appears well-developed and well-nourished. No distress.  HENT:  Head: Normocephalic.  Right Ear: External ear normal.  Left Ear: External ear normal.  Nose: Nose normal.  Mouth/Throat: Oropharynx is clear and moist. No oropharyngeal exudate.  No sinus tenderness No temporal tenderness  No TMJ tenderness  2 cm bump on R side of head above temple (some tenderness)  Old ecchymosis over R forehead and under both eyes    Eyes: Pupils are equal, round, and reactive to light. Conjunctivae and EOM are normal. Right eye exhibits no discharge. Left eye exhibits no discharge. No scleral icterus.  No nystagmus  Neck: Normal range of motion and full passive range of motion without pain. Neck supple. No JVD present. Carotid bruit is not present. No tracheal deviation present. No thyromegaly present.  Cardiovascular: Normal rate, regular rhythm and normal heart sounds.   No murmur heard. Pulmonary/Chest: Effort normal and breath sounds normal. No respiratory distress. She has no wheezes. She has no rales.  Abdominal: Soft. Bowel sounds are normal. She exhibits no distension and no mass. There is no tenderness.  Musculoskeletal: She exhibits no edema or tenderness.  Lymphadenopathy:    She has no cervical adenopathy.  Neurological: She is alert and oriented to person, place, and time. She has normal strength and normal reflexes. She displays no atrophy and no tremor. No cranial nerve deficit or sensory deficit. She exhibits normal muscle tone. She displays a negative Romberg sign. Coordination and gait normal.  No focal cerebellar signs   Speech is improved from last visit- quicker with  no slurring   Skin: Skin is warm and dry. Rash noted.  No purpura noted. Rash is not pustular. No pallor.  Fair complexion  Old ecchymosis over R side of face and under both eyes   Rash around axillae- faint pink color with scale (no annular lesions)- oval in shape   Psychiatric: She has a normal mood and affect. Her behavior is normal. Thought content normal.          Assessment & Plan:   Problem List Items Addressed This Visit      Cardiovascular and Mediastinum   Essential hypertension - Primary    bp in fair control at this time  BP Readings from Last 1 Encounters:  03/28/17 138/64   No changes needed Disc lifstyle change with low sodium diet and exercise  Improved with addition of amlodipine  Continue to follow         Endocrine   Combined hyperlipidemia associated with type 2 diabetes mellitus ([place])    Pt has upcoming endocrinology appt for DM  Disc goals for lipids and reasons to control them Rev labs with pt Rev low sat fat diet in detail  Improved with atorvastatin - LDL is under 70          Musculoskeletal and Integument   Rash and nonspecific skin eruption    Mild rash around axillae resembles dermatitis  Disc changing her soap and antiperspirant to scent/color free  Triamcinolone cream prn  Update if not starting to improve in a week or if worsening           Other   Head injury, closed, initial encounter    Pt fell asleep on the toilet in the middle of the night  CT of head for headache r/o subdural hematoma   Long disc re: fall precautions  Disc use of bedside commode or to have husband get up with her EVERY TIME she goes to the bathroom at night  Agrees to do this       Post-traumatic headache    After a fall and closed head injury on Thursday  R side / same side as her impact  CT of head ordered to r/o subdural hematoma  Reassuring neuro exam  Long disc re: fall precautions       Relevant Orders   CT Head Wo Contrast (Completed)

## 2017-03-30 NOTE — Assessment & Plan Note (Signed)
Pt fell asleep on the toilet in the middle of the night  CT of head for headache r/o subdural hematoma   Long disc re: fall precautions  Disc use of bedside commode or to have husband get up with her EVERY TIME she goes to the bathroom at night  Agrees to do this

## 2017-03-30 NOTE — Assessment & Plan Note (Signed)
Pt has upcoming endocrinology appt for DM  Disc goals for lipids and reasons to control them Rev labs with pt Rev low sat fat diet in detail  Improved with atorvastatin - LDL is under 70

## 2017-03-30 NOTE — Assessment & Plan Note (Signed)
Mild rash around axillae resembles dermatitis  Disc changing her soap and antiperspirant to scent/color free  Triamcinolone cream prn  Update if not starting to improve in a week or if worsening

## 2017-03-30 NOTE — Assessment & Plan Note (Signed)
After a fall and closed head injury on Thursday  R side / same side as her impact  CT of head ordered to r/o subdural hematoma  Reassuring neuro exam  Long disc re: fall precautions

## 2017-03-30 NOTE — Assessment & Plan Note (Signed)
bp in fair control at this time  BP Readings from Last 1 Encounters:  03/28/17 138/64   No changes needed Disc lifstyle change with low sodium diet and exercise  Improved with addition of amlodipine  Continue to follow

## 2017-04-04 DIAGNOSIS — M5412 Radiculopathy, cervical region: Secondary | ICD-10-CM | POA: Diagnosis not present

## 2017-04-04 DIAGNOSIS — M549 Dorsalgia, unspecified: Secondary | ICD-10-CM | POA: Diagnosis not present

## 2017-04-04 DIAGNOSIS — M542 Cervicalgia: Secondary | ICD-10-CM | POA: Diagnosis not present

## 2017-04-04 DIAGNOSIS — M79601 Pain in right arm: Secondary | ICD-10-CM | POA: Diagnosis not present

## 2017-04-07 DIAGNOSIS — M542 Cervicalgia: Secondary | ICD-10-CM | POA: Diagnosis not present

## 2017-04-07 DIAGNOSIS — M79601 Pain in right arm: Secondary | ICD-10-CM | POA: Diagnosis not present

## 2017-04-07 DIAGNOSIS — M549 Dorsalgia, unspecified: Secondary | ICD-10-CM | POA: Diagnosis not present

## 2017-04-07 DIAGNOSIS — M5412 Radiculopathy, cervical region: Secondary | ICD-10-CM | POA: Diagnosis not present

## 2017-04-08 DIAGNOSIS — M549 Dorsalgia, unspecified: Secondary | ICD-10-CM | POA: Diagnosis not present

## 2017-04-08 DIAGNOSIS — M79601 Pain in right arm: Secondary | ICD-10-CM | POA: Diagnosis not present

## 2017-04-08 DIAGNOSIS — M542 Cervicalgia: Secondary | ICD-10-CM | POA: Diagnosis not present

## 2017-04-08 DIAGNOSIS — M5412 Radiculopathy, cervical region: Secondary | ICD-10-CM | POA: Diagnosis not present

## 2017-04-10 ENCOUNTER — Ambulatory Visit (INDEPENDENT_AMBULATORY_CARE_PROVIDER_SITE_OTHER): Payer: Medicare Other | Admitting: Internal Medicine

## 2017-04-10 ENCOUNTER — Encounter: Payer: Self-pay | Admitting: Internal Medicine

## 2017-04-10 VITALS — BP 130/62 | HR 61 | Wt 110.0 lb

## 2017-04-10 DIAGNOSIS — E1165 Type 2 diabetes mellitus with hyperglycemia: Secondary | ICD-10-CM | POA: Diagnosis not present

## 2017-04-10 DIAGNOSIS — E11319 Type 2 diabetes mellitus with unspecified diabetic retinopathy without macular edema: Secondary | ICD-10-CM | POA: Diagnosis not present

## 2017-04-10 NOTE — Progress Notes (Signed)
Patient ID: Stephanie [name], female   DOB: [birth year], 83 y.o.   MRN: [id number]   HPI: Stephanie [name] is a 83 y.o.-year-old female, referred by her PCP, Dr. Glori Bickers, for management of DM2, dx in ~2008, non-insulin-dependent, uncontrolled, with complications (DR OU, cerebrovascular ds - s/p TIAs, carotid stenosis, CAD, PVD). She is here with her husband who offers part of the history, especially about her dietary habits.  Last hemoglobin A1c was: Lab Results  Component Value Date   HGBA1C 7.0 (H) 02/11/2017   HGBA1C 8.2 (H) 09/05/2016   HGBA1C 10.7 (H) 09/04/2015   Pt is on a regimen of: - Metformin 1000 mg 2x a day, with meals - Glipizide XL 10 mg in a.m. before breakfast  Pt checks her sugars 1x a day, fasting. - am: 110-160, 208 - 2h after b'fast: n/c - before lunch: n/c - 2h after lunch: n/c - before dinner: n/c - 2h after dinner: n/c - bedtime: n/c - nighttime: n/c No lows. Lowest sugar was 110; she has hypoglycemia awareness  - ? Level. Highest sugar was 208  Glucometer: ReliOn  Pt's meals are: - Breakfast: cereal (cheerios) + milk 2% lactose-free or grits + cheddar cheese - Lunch: sandwich  - with ham, [place] - Dinner: meat + veggies + starch, gravy  - Snacks: 2/day; was eating a lot of sweets >> reduced the amount in last few months.  - no CKD, last BUN/creatinine:  Lab Results  Component Value Date   BUN 23 02/11/2017   BUN 21 09/05/2016   CREATININE 0.87 02/11/2017   CREATININE 0.98 09/05/2016   - last set of lipids: Lab Results  Component Value Date   CHOL 103 03/26/2017   HDL 41.50 03/26/2017   LDLCALC 34 03/26/2017   LDLDIRECT 54.0 09/04/2015   TRIG 138.0 03/26/2017   CHOLHDL 2 03/26/2017  On atorvastatin 20 mg daily (recently increased) - last eye exam was in 05/2016 >>  No DR.  - denies numbness and tingling in her feet.  Pt has FH of DM in PGM, P aunt.   ROS: Constitutional: + weight loss after reducing sweets, + fatigue, no subjective  hyperthermia/hypothermia Eyes: no blurry vision, no xerophthalmia ENT: no sore throat, no nodules palpated in throat, no dysphagia/odynophagia, + hoarseness, + tinnitus Cardiovascular: no CP/SOB/palpitations/leg swelling Respiratory: no cough/SOB Gastrointestinal: no N/V/+ D/no C/+ heartburn Musculoskeletal: + both: muscle/joint aches Skin: Rash, + easy bruising, + hair loss Neurological: no tremors/numbness/tingling/dizziness, + HA Psychiatric: no depression/anxiety  Past Medical History:  Diagnosis Date  . Allergy    allergic rhinitis  . Anxiety   . Arthritis    osteoarthritis  . Degenerative disk disease    in neck  . Diabetes mellitus    type II  . Hyperlipidemia   . Hypertension   . Hypoaldosteronism ([place]) 6/12   from DM causing hyperkalemia   . Osteopenia   . Periodontal disease   . Peripheral vascular disease ([place])   . Transient cerebral ischemia 11/06/2016   Past Surgical History:  Procedure Laterality Date  . CHOLECYSTECTOMY    . ESOPHAGOGASTRODUODENOSCOPY  03/2004   gastritis by biopsy  . GUM SURGERY  06/2010   laser surgery for gums  . MM BREAST STEREO BX*L*R/S  12/12   normal  . TONSILLECTOMY    . TRANSTHORACIC ECHOCARDIOGRAM  11/2016   EF 55-60%, grade 1 DD with elevated LV end diastolic filling pressures, mildly dilated LA, mild TR and PR.   . TUBAL LIGATION  Social History   Social History  . Marital status: Married    Spouse name: N/A  . Number of children: 3   Occupational History  . Retired Therapist, sports   Social History Main Topics  . Smoking status: Never Smoker  . Smokeless tobacco: Never Used  . Alcohol use No  . Drug use: No   Current Outpatient Prescriptions on File Prior to Visit  Medication Sig Dispense Refill  . amLODipine (NORVASC) 5 MG tablet Take 1 tablet (5 mg total) by mouth daily. 30 tablet 11  . atorvastatin (LIPITOR) 20 MG tablet Take 1 tablet (20 mg total) by mouth daily. 90 tablet 3  . chlorthalidone (HYGROTON) 25 MG tablet  TAKE ONE TABLET BY MOUTH ONCE DAILY 90 tablet 1  . diphenhydramine-acetaminophen (TYLENOL PM EXTRA STRENGTH) 25-500 MG TABS Take 1 tablet by mouth at bedtime as needed.      . dipyridamole-aspirin (AGGRENOX) 200-25 MG 12hr capsule Take 1 capsule by mouth 2 (two) times daily. 60 capsule 6  . ergocalciferol (VITAMIN D2) 50000 units capsule Take 1 capsule (50,000 Units total) by mouth once a week. 12 capsule 0  . GLIPIZIDE XL 10 MG 24 hr tablet TAKE ONE TABLET BY MOUTH ONCE DAILY WITH BREAKFAST 90 tablet 1  . glucose blood (ACCU-CHEK SMARTVIEW) test strip USE ONE STRIP TO TEST BLOOD SUGAR TWICE  DAILY FOR POORLY CONTROLLED dm TYPE 2 100 each 3  . glucose blood (FREESTYLE TEST STRIPS) test strip Use as instructed 100 each 12  . metFORMIN (GLUCOPHAGE) 1000 MG tablet TAKE ONE TABLET BY MOUTH TWICE DAILY WITH MEALS 180 tablet 1  . metoprolol succinate (TOPROL-XL) 100 MG 24 hr tablet TAKE ONE TABLET BY MOUTH ONCE DAILY. TAKE WITH OR IMMEDIATLY FOLLOWING A MEAL 90 tablet 1  . PARoxetine (PAXIL) 40 MG tablet TAKE ONE TABLET BY MOUTH ONCE DAILY 90 tablet 0  . promethazine (PHENERGAN) 25 MG tablet Take 0.5-1 tablets (12.5-25 mg total) by mouth every 8 (eight) hours as needed for nausea or vomiting. Caution of sedation 20 tablet 0  . triamcinolone cream (KENALOG) 0.1 % Apply 1 application topically 2 (two) times daily. To affected areas 30 g 0   No current facility-administered medications on file prior to visit.    Allergies  Allergen Reactions  . Ace Inhibitors     REACTION: generic, reaction not known  . Amlodipine Besy-Benazepril Hcl     REACTION: chest pain  . Clopidogrel Bisulfate     REACTION: GI  . Erythromycin     REACTION: GI upset   Family History  Problem Relation Age of Onset  . Cancer Mother        vaginal CA in situ  . Hypertension Mother   . Heart disease Mother        CAD and MI  . Heart disease Father        CAD  . Diabetes Father   . Heart disease Son        congenital  valve problem    PE: BP 130/62 (BP Location: Left Arm, Patient Position: Sitting)   Pulse 61   Wt 110 lb (49.9 kg)   SpO2 98%   BMI 20.78 kg/m  Wt Readings from Last 3 Encounters:  04/10/17 110 lb (49.9 kg)  03/28/17 109 lb 8 oz (49.7 kg)  02/11/17 113 lb 4 oz (51.4 kg)   Constitutional: thin, in NAD Eyes: PERRLA, EOMI, no exophthalmos ENT: moist mucous membranes, no thyromegaly, no cervical lymphadenopathy Cardiovascular: RRR, No  MRG Respiratory: CTA B Gastrointestinal: abdomen soft, NT, ND, BS+ Musculoskeletal: no deformities, strength intact in all 4 Skin: moist, warm, no rashes Neurological: no tremor with outstretched hands, DTR normal in all 4  ASSESSMENT: 1. DM2, non-insulin-dependent, uncontrolled, with complications - DR OU - cerebrovascular ds - s/p TIAs - carotid stenosis - CAD - PVD  PLAN:  1. Patient with long-standing, uncontrolled diabetes, on oral antidiabetic regimen, with significant improvement in her DM control in last 1.5 years,And especially after cutting down on sweets with the help of her husband. Her latest HbA1c obtained 2 months ago was great, at 7%. She continues on metformin and glipizide. She has occasional increased hunger and sweating before dinner but she never checks her sugars at that time. I strongly advised her to do so and let me know if her sugars are low then. In that case, we will need to reduce her glipizide dose in a.m. Otherwise, I will continue with her current regimen, but we spent a considerable about the time discussing improvements in her diet  - I will see the patient back in 2 months, and at that time, we will see if we need to deescalate the regimen or to add another medication  - I suggested to:  Patient Instructions  Please continue: - Metformin 1000 mg 2x a day, with meals - Glipizide XL 10 mg in a.m. before breakfast  Check sugars once a day, rotating check times.  Please return in 2 months with your sugar log.   -  Strongly advised her to start checking sugars at different times of the day - check 1 time a day, but rotating checks - given sugar log and advised how to fill it and to bring it at next appt  - given foot care handout and explained the principles  - given instructions for hypoglycemia management "15-15 rule"  - advised for yearly eye exams  - refuses flu shot for now - Return to clinic in 2 mo with sugar log   Philemon Kingdom, MD PhD [facility]

## 2017-04-10 NOTE — Patient Instructions (Addendum)
Please continue: - Metformin 1000 mg 2x a day, with meals - Glipizide XL 10 mg in a.m. before breakfast  Check sugars once a day, rotating check times.  Please return in 2 months with your sugar log.   PATIENT INSTRUCTIONS FOR TYPE 2 DIABETES:  **Please join MyChart!** - see attached instructions about how to join if you have not done so already.  DIET AND EXERCISE Diet and exercise is an important part of diabetic treatment.  We recommended aerobic exercise in the form of brisk walking (working between 40-60% of maximal aerobic capacity, similar to brisk walking) for 150 minutes per week (such as 30 minutes five days per week) along with 3 times per week performing 'resistance' training (using various gauge rubber tubes with handles) 5-10 exercises involving the major muscle groups (upper body, lower body and core) performing 10-15 repetitions (or near fatigue) each exercise. Start at half the above goal but build slowly to reach the above goals. If limited by weight, joint pain, or disability, we recommend daily walking in a swimming pool with water up to waist to reduce pressure from joints while allow for adequate exercise.    BLOOD GLUCOSES Monitoring your blood glucoses is important for continued management of your diabetes. Please check your blood glucoses 2-4 times a day: fasting, before meals and at bedtime (you can rotate these measurements - e.g. one day check before the 3 meals, the next day check before 2 of the meals and before bedtime, etc.).   HYPOGLYCEMIA (low blood sugar) Hypoglycemia is usually a reaction to not eating, exercising, or taking too much insulin/ other diabetes drugs.  Symptoms include tremors, sweating, hunger, confusion, headache, etc. Treat IMMEDIATELY with 15 grams of Carbs: . 4 glucose tablets .  cup regular juice/soda . 2 tablespoons raisins . 4 teaspoons sugar . 1 tablespoon honey Recheck blood glucose in 15 mins and repeat above if still  symptomatic/blood glucose <100.  RECOMMENDATIONS TO REDUCE YOUR RISK OF DIABETIC COMPLICATIONS: * Take your prescribed MEDICATION(S) * Follow a DIABETIC diet: Complex carbs, fiber rich foods, (monounsaturated and polyunsaturated) fats * AVOID saturated/trans fats, high fat foods, >2,300 mg salt per day. * EXERCISE at least 5 times a week for 30 minutes or preferably daily.  * DO NOT SMOKE OR DRINK more than 1 drink a day. * Check your FEET every day. Do not wear tightfitting shoes. Contact us if you develop an ulcer * See your EYE doctor once a year or more if needed * Get a FLU shot once a year * Get a PNEUMONIA vaccine once before and once after age 44 years  GOALS:  * Your Hemoglobin A1c of <7%  * fasting sugars need to be <130 * after meals sugars need to be <180 (2h after you start eating) * Your Systolic BP should be 831 or lower  * Your Diastolic BP should be 80 or lower  * Your HDL (Good Cholesterol) should be 40 or higher  * Your LDL (Bad Cholesterol) should be 100 or lower. * Your Triglycerides should be 150 or lower  * Your Urine microalbumin (kidney function) should be <30 * Your Body Mass Index should be 25 or lower    Please consider the following ways to cut down carbs and fat and increase fiber and micronutrients in your diet: - substitute whole grain for white bread or pasta - substitute brown rice for white rice - substitute 90-calorie flat bread pieces for slices of bread when possible - substitute  sweet potatoes or yams for white potatoes - substitute humus for margarine - substitute tofu for cheese when possible - substitute almond or rice milk for regular milk (would not drink soy milk daily due to concern for soy estrogen influence on breast cancer risk) - substitute dark chocolate for other sweets when possible - substitute water - can add lemon or orange slices for taste - for diet sodas (artificial sweeteners will trick your body that you can eat sweets  without getting calories and will lead you to overeating and weight gain in the long run) - do not skip breakfast or other meals (this will slow down the metabolism and will result in more weight gain over time)  - can try smoothies made from fruit and almond/rice milk in am instead of regular breakfast - can also try old-fashioned (not instant) oatmeal made with almond/rice milk in am - order the dressing on the side when eating salad at a restaurant (pour less than half of the dressing on the salad) - eat as little meat as possible - can try juicing, but should not forget that juicing will get rid of the fiber, so would alternate with eating raw veg./fruits or drinking smoothies - use as little oil as possible, even when using olive oil - can dress a salad with a mix of balsamic vinegar and lemon juice, for e.g. - use agave nectar, stevia sugar, or regular sugar rather than artificial sweateners - steam or broil/roast veggies  - snack on veggies/fruit/nuts (unsalted, preferably) when possible, rather than processed foods - reduce or eliminate aspartame in diet (it is in diet sodas, chewing gum, etc) Read the labels!  Try to read Dr. Janene Harvey book: "Program for Reversing Diabetes" for other ideas for healthy eating.

## 2017-04-11 DIAGNOSIS — M549 Dorsalgia, unspecified: Secondary | ICD-10-CM | POA: Diagnosis not present

## 2017-04-11 DIAGNOSIS — M79601 Pain in right arm: Secondary | ICD-10-CM | POA: Diagnosis not present

## 2017-04-11 DIAGNOSIS — M5412 Radiculopathy, cervical region: Secondary | ICD-10-CM | POA: Diagnosis not present

## 2017-04-11 DIAGNOSIS — M542 Cervicalgia: Secondary | ICD-10-CM | POA: Diagnosis not present

## 2017-04-16 ENCOUNTER — Other Ambulatory Visit: Payer: Self-pay | Admitting: Family Medicine

## 2017-04-17 DIAGNOSIS — M79601 Pain in right arm: Secondary | ICD-10-CM | POA: Diagnosis not present

## 2017-04-17 DIAGNOSIS — M5412 Radiculopathy, cervical region: Secondary | ICD-10-CM | POA: Diagnosis not present

## 2017-04-17 DIAGNOSIS — M542 Cervicalgia: Secondary | ICD-10-CM | POA: Diagnosis not present

## 2017-04-17 DIAGNOSIS — M549 Dorsalgia, unspecified: Secondary | ICD-10-CM | POA: Diagnosis not present

## 2017-04-21 DIAGNOSIS — M5412 Radiculopathy, cervical region: Secondary | ICD-10-CM | POA: Diagnosis not present

## 2017-04-21 DIAGNOSIS — M542 Cervicalgia: Secondary | ICD-10-CM | POA: Diagnosis not present

## 2017-04-21 DIAGNOSIS — M549 Dorsalgia, unspecified: Secondary | ICD-10-CM | POA: Diagnosis not present

## 2017-04-21 DIAGNOSIS — M79601 Pain in right arm: Secondary | ICD-10-CM | POA: Diagnosis not present

## 2017-04-24 DIAGNOSIS — M549 Dorsalgia, unspecified: Secondary | ICD-10-CM | POA: Diagnosis not present

## 2017-04-24 DIAGNOSIS — M542 Cervicalgia: Secondary | ICD-10-CM | POA: Diagnosis not present

## 2017-04-24 DIAGNOSIS — I10 Essential (primary) hypertension: Secondary | ICD-10-CM | POA: Diagnosis not present

## 2017-04-24 DIAGNOSIS — M4722 Other spondylosis with radiculopathy, cervical region: Secondary | ICD-10-CM | POA: Diagnosis not present

## 2017-04-24 DIAGNOSIS — M5412 Radiculopathy, cervical region: Secondary | ICD-10-CM | POA: Diagnosis not present

## 2017-04-24 DIAGNOSIS — M79601 Pain in right arm: Secondary | ICD-10-CM | POA: Diagnosis not present

## 2017-04-29 ENCOUNTER — Ambulatory Visit (INDEPENDENT_AMBULATORY_CARE_PROVIDER_SITE_OTHER): Payer: Medicare Other

## 2017-04-29 DIAGNOSIS — Z23 Encounter for immunization: Secondary | ICD-10-CM

## 2017-06-06 ENCOUNTER — Other Ambulatory Visit: Payer: Self-pay | Admitting: Family Medicine

## 2017-06-09 NOTE — Telephone Encounter (Signed)
Electronically refill request for metFORMIN (GLUCOPHAGE) 1000 MG tablet.  Patient had est care with Dr Cruzita Lederer on 04/10/2017 regarding DM.

## 2017-06-10 ENCOUNTER — Telehealth: Payer: Self-pay | Admitting: Family Medicine

## 2017-06-10 NOTE — Telephone Encounter (Signed)
Copied from [place] [phone]. Topic: Quick Communication - See Telephone Encounter >> Jun 10, 2017  2:12 PM Corie Chiquito, [place] wrote: CRM for notification. See Telephone encounter for: Patient called and stated that she still needs her metformin refilled by today. Patient has reached out to the pharmacy as well and was told that they have faxed over the request three times. If someone could please give her a call back at [phone]  06/10/17.

## 2017-06-10 NOTE — Telephone Encounter (Signed)
Refill request

## 2017-06-12 ENCOUNTER — Ambulatory Visit: Payer: Medicare Other | Admitting: Internal Medicine

## 2017-06-17 ENCOUNTER — Other Ambulatory Visit: Payer: Self-pay | Admitting: Family Medicine

## 2017-06-17 NOTE — Telephone Encounter (Signed)
Last filled:  05/22/17, #60 Last OV:  03/28/17 with Dr. Glori Bickers Next OV:  None  PCP is [facility]

## 2017-06-18 NOTE — Telephone Encounter (Signed)
Will refill electronically  

## 2017-06-18 NOTE — Telephone Encounter (Signed)
Forward to Dr Glori Bickers.

## 2017-08-18 ENCOUNTER — Other Ambulatory Visit: Payer: Self-pay | Admitting: Family Medicine

## 2017-08-27 ENCOUNTER — Encounter: Payer: Self-pay | Admitting: Family Medicine

## 2017-08-27 ENCOUNTER — Ambulatory Visit (INDEPENDENT_AMBULATORY_CARE_PROVIDER_SITE_OTHER): Payer: Medicare Other | Admitting: Family Medicine

## 2017-08-27 VITALS — BP 130/66 | HR 60 | Temp 97.7°F | Ht 61.0 in | Wt 109.2 lb

## 2017-08-27 DIAGNOSIS — G459 Transient cerebral ischemic attack, unspecified: Secondary | ICD-10-CM | POA: Diagnosis not present

## 2017-08-27 DIAGNOSIS — E782 Mixed hyperlipidemia: Secondary | ICD-10-CM | POA: Diagnosis not present

## 2017-08-27 DIAGNOSIS — E11319 Type 2 diabetes mellitus with unspecified diabetic retinopathy without macular edema: Secondary | ICD-10-CM

## 2017-08-27 DIAGNOSIS — E1169 Type 2 diabetes mellitus with other specified complication: Secondary | ICD-10-CM | POA: Diagnosis not present

## 2017-08-27 DIAGNOSIS — E1165 Type 2 diabetes mellitus with hyperglycemia: Secondary | ICD-10-CM

## 2017-08-27 DIAGNOSIS — E1351 Other specified diabetes mellitus with diabetic peripheral angiopathy without gangrene: Secondary | ICD-10-CM

## 2017-08-27 DIAGNOSIS — IMO0002 Reserved for concepts with insufficient information to code with codable children: Secondary | ICD-10-CM

## 2017-08-27 DIAGNOSIS — I2510 Atherosclerotic heart disease of native coronary artery without angina pectoris: Secondary | ICD-10-CM

## 2017-08-27 DIAGNOSIS — I10 Essential (primary) hypertension: Secondary | ICD-10-CM

## 2017-08-27 LAB — MICROALBUMIN / CREATININE URINE RATIO
Creatinine,U: 123.6 mg/dL
MICROALB UR: 6.2 mg/dL — AB (ref 0.0–1.9)
Microalb Creat Ratio: 5 mg/g (ref 0.0–30.0)

## 2017-08-27 LAB — COMPREHENSIVE METABOLIC PANEL
ALT: 6 U/L (ref 0–35)
AST: 14 U/L (ref 0–37)
Albumin: 4.3 g/dL (ref 3.5–5.2)
Alkaline Phosphatase: 46 U/L (ref 39–117)
BUN: 22 mg/dL (ref 6–23)
CHLORIDE: 103 meq/L (ref 96–112)
CO2: 26 mEq/L (ref 19–32)
CREATININE: 1.04 mg/dL (ref 0.40–1.20)
Calcium: 9.7 mg/dL (ref 8.4–10.5)
GFR: 53.98 mL/min — ABNORMAL LOW (ref >60.00)
GLUCOSE: 95 mg/dL (ref 70–99)
Potassium: 4.9 mEq/L (ref 3.5–5.1)
SODIUM: 135 meq/L (ref 135–145)
Total Bilirubin: 0.4 mg/dL (ref 0.2–1.2)
Total Protein: 7.2 g/dL (ref 6.0–8.3)

## 2017-08-27 LAB — LIPID PANEL
Cholesterol: 107 mg/dL (ref 0–200)
HDL: 36.9 mg/dL — ABNORMAL LOW (ref >39.00)
LDL CALC: 37 mg/dL (ref 0–99)
NonHDL: 70.43
TRIGLYCERIDES: 166 mg/dL — AB (ref 0.0–149.0)
Total CHOL/HDL Ratio: 3
VLDL: 33.2 mg/dL (ref 0.0–40.0)

## 2017-08-27 LAB — HEMOGLOBIN A1C: HEMOGLOBIN A1C: 6.3 % (ref 4.6–6.5)

## 2017-08-27 MED ORDER — PAROXETINE HCL 40 MG PO TABS: 40.0000 mg | ORAL_TABLET | Freq: Every day | ORAL | 3 refills | Status: DC

## 2017-08-27 MED ORDER — METFORMIN HCL 1000 MG PO TABS: 1000.0000 mg | ORAL_TABLET | Freq: Two times a day (BID) | ORAL | 3 refills | Status: DC

## 2017-08-27 MED ORDER — METOPROLOL SUCCINATE ER 100 MG PO TB24: ORAL_TABLET | ORAL | 3 refills | Status: DC

## 2017-08-27 MED ORDER — ASPIRIN-DIPYRIDAMOLE ER 25-200 MG PO CP12: 1.0000 | ORAL_CAPSULE | Freq: Two times a day (BID) | ORAL | 3 refills | Status: DC

## 2017-08-27 MED ORDER — CHLORTHALIDONE 25 MG PO TABS: 25.0000 mg | ORAL_TABLET | Freq: Every day | ORAL | 3 refills | Status: DC

## 2017-08-27 NOTE — Patient Instructions (Addendum)
If you want to change statins to see if the atrovastatin causes muscle pain - I like crestor  Call your insurance to see if that is covered and I am happy to change it   Labs today   You need a small lunch with protein and carb around 2 pm - this should help prevent low blood sugar (also try to incorporate a fruit or vegetable)   We will send for eye exam report   Will plan follow up based on labs   We will refer you to a new cardiologist to check in for your coronary artery disease

## 2017-08-27 NOTE — Assessment & Plan Note (Addendum)
2 stents  No cardiology visit in a while- will refer  No angina  Starting to get DM under control  Hx of carotid stenosis and pvd also  On statin  On aggrenox

## 2017-08-27 NOTE — Progress Notes (Signed)
Subjective:    Patient ID: Stephanie [name], female    DOB: [birth year], 84 y.o.   MRN: [id number]  HPI  Here for f/u of chronic health problems   She has struggled with hair loss - ? Due to med  She stays sore all over - ? From her cholesterol medicine or not /she has been on it for years and years (for vascular dz) No new activity or exercise    Wt Readings from Last 3 Encounters:  08/27/17 109 lb 4 oz (49.6 kg)  04/10/17 110 lb (49.9 kg)  03/28/17 109 lb 8 oz (49.7 kg)   20.64 kg/m    Diabetes-she saw Dr Cruzita Lederer 9/18 Did not keep her follow up  She cannot afford the 40 $ copay  She recognizes symptoms of low sugar daily at 3:30= so she eats chips and salsa  Home sugar results  DM diet - a lot more careful with what she eats  She is no longer eating sweets (very hard for her)  Not eating 3 meals per day- she eats a late breakfast and then does not eat a lunch  Exercise - regular activity /does not plan to (hard to her to get to mall to walk)  Symptoms A1C last  Lab Results  Component Value Date   HGBA1C 7.0 (H) 02/11/2017  needs it checked today   Taking glipizide 10 mg xl Metformin 1000 mg bid  Refuses insulin   Renal protection-cannot take ace  Lab Results  Component Value Date   MICROALBUR 2.2 (H) 09/05/2016    Last eye exam  Fall 2018  Hx of retinopathy in the past   Hx of PVD/ CAD/carotid stenosis   bp is stable today  No cp or palpitations or headaches or edema  No side effects to medicines  BP Readings from Last 3 Encounters:  08/27/17 130/66  04/10/17 130/62  03/28/17 138/64     Lab Results  Component Value Date   CREATININE 0.87 02/11/2017   BUN 23 02/11/2017   NA 136 02/11/2017   K 4.6 02/11/2017   CL 103 02/11/2017   CO2 26 02/11/2017   Lab Results  Component Value Date   ALT 7 02/11/2017   AST 12 02/11/2017   ALKPHOS 48 02/11/2017   BILITOT 0.4 02/11/2017   Lab Results  Component Value Date   WBC 9.6 09/05/2016   HGB 12.7  09/05/2016   HCT 37.5 09/05/2016   MCV 88.8 09/05/2016   PLT 252.0 09/05/2016    Hyperlipidemia Lab Results  Component Value Date   CHOL 103 03/26/2017   HDL 41.50 03/26/2017   LDLCALC 34 03/26/2017   LDLDIRECT 54.0 09/04/2015   TRIG 138.0 03/26/2017   CHOLHDL 2 03/26/2017   On atorvastatin and diet    Patient Active Problem List   Diagnosis Date Noted  . Head injury, closed, initial encounter 03/28/2017  . Rash and nonspecific skin eruption 03/28/2017  . Post-traumatic headache 03/28/2017  . Carotid stenosis, bilateral 11/20/2016  . Transient cerebral ischemia 11/06/2016  . Neck pain 11/10/2015  . Vitamin D deficiency 09/04/2015  . Routine general medical examination at a health care facility 09/04/2015  . Encounter for Medicare annual wellness exam 05/30/2014  . Colon cancer screening 05/30/2014  . History of fall 05/26/2013  . Gynecological examination 02/20/2011  . HYPERKALEMIA 03/06/2010  . HELICOBACTER PYLORI INFECTION 12/19/2006  . HSV 12/19/2006  . Type 2 diabetes, uncontrolled, with retinopathy ([place]) 12/19/2006  . Diabetic retinopathy ([place]) 12/19/2006  .  Combined hyperlipidemia associated with type 2 diabetes mellitus ([place]) 12/19/2006  . ANXIETY 12/19/2006  . Essential hypertension 12/19/2006  . Coronary atherosclerosis 12/19/2006  . Peripheral vascular disease due to secondary diabetes mellitus ([place]) 12/19/2006  . ALLERGIC RHINITIS 12/19/2006  . OSTEOARTHRITIS 12/19/2006  . Osteopenia 12/19/2006   Past Medical History:  Diagnosis Date  . Allergy    allergic rhinitis  . Anxiety   . Arthritis    osteoarthritis  . Degenerative disk disease    in neck  . Diabetes mellitus    type II  . Hyperlipidemia   . Hypertension   . Hypoaldosteronism ([place]) 6/12   from DM causing hyperkalemia   . Osteopenia   . Periodontal disease   . Peripheral vascular disease ([place])   . Transient cerebral ischemia 11/06/2016   Past Surgical History:  Procedure Laterality  Date  . CHOLECYSTECTOMY    . ESOPHAGOGASTRODUODENOSCOPY  03/2004   gastritis by biopsy  . GUM SURGERY  06/2010   laser surgery for gums  . MM BREAST STEREO BX*L*R/S  12/12   normal  . TONSILLECTOMY    . TRANSTHORACIC ECHOCARDIOGRAM  11/2016   EF 55-60%, grade 1 DD with elevated LV end diastolic filling pressures, mildly dilated LA, mild TR and PR.   . TUBAL LIGATION     Social History   Tobacco Use  . Smoking status: Never Smoker  . Smokeless tobacco: Never Used  Substance Use Topics  . Alcohol use: No    Alcohol/week: 0.0 oz  . Drug use: No   Family History  Problem Relation Age of Onset  . Cancer Mother        vaginal CA in situ  . Hypertension Mother   . Heart disease Mother        CAD and MI  . Heart disease Father        CAD  . Diabetes Father   . Heart disease Son        congenital valve problem   Allergies  Allergen Reactions  . Ace Inhibitors     REACTION: generic, reaction not known  . Amlodipine Besy-Benazepril Hcl     REACTION: chest pain  . Clopidogrel Bisulfate     REACTION: GI  . Erythromycin     REACTION: GI upset   Current Outpatient Medications on File Prior to Visit  Medication Sig Dispense Refill  . amLODipine (NORVASC) 5 MG tablet Take 1 tablet (5 mg total) by mouth daily. 30 tablet 11  . atorvastatin (LIPITOR) 20 MG tablet Take 1 tablet (20 mg total) by mouth daily. 90 tablet 3  . diphenhydramine-acetaminophen (TYLENOL PM EXTRA STRENGTH) 25-500 MG TABS Take 1 tablet by mouth at bedtime as needed.      Marland Kitchen GLIPIZIDE XL 10 MG 24 hr tablet TAKE 1 TABLET BY MOUTH ONCE DAILY WITH BREAKFAST 90 tablet 1  . glucose blood (ACCU-CHEK SMARTVIEW) test strip USE ONE STRIP TO TEST BLOOD SUGAR TWICE  DAILY FOR POORLY CONTROLLED dm TYPE 2 100 each 3  . glucose blood (FREESTYLE TEST STRIPS) test strip Use as instructed 100 each 12  . promethazine (PHENERGAN) 25 MG tablet Take 0.5-1 tablets (12.5-25 mg total) by mouth every 8 (eight) hours as needed for nausea  or vomiting. Caution of sedation 20 tablet 0  . ranitidine (ZANTAC) 150 MG tablet Take 150 mg by mouth 2 (two) times daily.    Marland Kitchen triamcinolone cream (KENALOG) 0.1 % Apply 1 application topically 2 (two) times daily. To affected areas  30 g 0   No current facility-administered medications on file prior to visit.     Review of Systems  Constitutional: Negative for activity change, appetite change, fatigue, fever and unexpected weight change.  HENT: Negative for congestion, ear pain, rhinorrhea, sinus pressure and sore throat.   Eyes: Negative for pain, redness and visual disturbance.  Respiratory: Negative for cough, shortness of breath and wheezing.   Cardiovascular: Negative for chest pain and palpitations.  Gastrointestinal: Negative for abdominal pain, blood in stool, constipation and diarrhea.  Endocrine: Negative for polydipsia and polyuria.  Genitourinary: Negative for dysuria, frequency and urgency.  Musculoskeletal: Positive for myalgias. Negative for arthralgias and back pain.  Skin: Negative for pallor and rash.  Allergic/Immunologic: Negative for environmental allergies.  Neurological: Negative for dizziness, syncope and headaches.  Hematological: Negative for adenopathy. Does not bruise/bleed easily.  Psychiatric/Behavioral: Negative for decreased concentration and dysphoric mood. The patient is not nervous/anxious.        Objective:   Physical Exam  Constitutional: She appears well-developed and well-nourished. No distress.  Well appearing  HENT:  Head: Normocephalic and atraumatic.  Mouth/Throat: Oropharynx is clear and moist.  Eyes: Conjunctivae and EOM are normal. Pupils are equal, round, and reactive to light.  Neck: Normal range of motion. Neck supple. No JVD present. Carotid bruit is not present. No thyromegaly present.  Cardiovascular: Normal rate, regular rhythm, normal heart sounds and intact distal pulses. Exam reveals no gallop.  No murmur  heard. Pulmonary/Chest: Effort normal and breath sounds normal. No respiratory distress. She has no wheezes. She has no rales.  No crackles  Abdominal: Soft. Bowel sounds are normal. She exhibits no distension, no abdominal bruit and no mass. There is no tenderness.  Musculoskeletal: She exhibits no edema.  Lymphadenopathy:    She has no cervical adenopathy.  Neurological: She is alert. She has normal reflexes. No cranial nerve deficit. She exhibits normal muscle tone. Coordination normal.  Skin: Skin is warm and dry. No rash noted. No pallor.  Psychiatric: She has a normal mood and affect.          Assessment & Plan:   Problem List Items Addressed This Visit      Cardiovascular and Mediastinum   Coronary atherosclerosis    2 stents  No cardiology visit in a while- will refer  No angina  Starting to get DM under control  Hx of carotid stenosis and pvd also  On statin  On aggrenox      Relevant Medications   chlorthalidone (HYGROTON) 25 MG tablet   metoprolol succinate (TOPROL-XL) 100 MG 24 hr tablet   Other Relevant Orders   Ambulatory referral to Cardiology   Essential hypertension    bp in fair control at this time  BP Readings from Last 1 Encounters:  08/27/17 130/66   No changes needed Disc lifstyle change with low sodium diet and exercise  Labs today      Relevant Medications   chlorthalidone (HYGROTON) 25 MG tablet   metoprolol succinate (TOPROL-XL) 100 MG 24 hr tablet   Other Relevant Orders   Comprehensive metabolic panel (Completed)   Lipid panel (Completed)   Peripheral vascular disease due to secondary diabetes mellitus ([place])    No new symptoms Also carotid stenosis On statin and aggrenox  Working on DM control Ref to cardiology for re check of CAD      Relevant Medications   chlorthalidone (HYGROTON) 25 MG tablet   metFORMIN (GLUCOPHAGE) 1000 MG tablet   metoprolol succinate (  TOPROL-XL) 100 MG 24 hr tablet   Other Relevant Orders    Ambulatory referral to Cardiology   Transient cerebral ischemia    No new symptoms      Relevant Medications   chlorthalidone (HYGROTON) 25 MG tablet   metoprolol succinate (TOPROL-XL) 100 MG 24 hr tablet     Endocrine   Combined hyperlipidemia associated with type 2 diabetes mellitus ([place])    Controlled with atorvastatin  Disc goals for lipids and reasons to control them Rev labs with pt Rev low sat fat diet in detail She c/o of some muscle pain  Offered a change to crestor to see if this is better She will check on pricing of that       Relevant Medications   chlorthalidone (HYGROTON) 25 MG tablet   metFORMIN (GLUCOPHAGE) 1000 MG tablet   metoprolol succinate (TOPROL-XL) 100 MG 24 hr tablet   Diabetic retinopathy ([place])    Pt states this is stable with eye exam fall 2018 DM is now better controlled Will send for that exam       Relevant Medications   metFORMIN (GLUCOPHAGE) 1000 MG tablet   Type 2 diabetes, uncontrolled, with retinopathy ([place]) - Primary    Pt refuses to go back to endocrinology since she cannot afford the copay Taking glipizide and metformin -doing better  Some hypoglycemia at 3:30 pm - due to skipping lunch  Outlined a new meal plan with small lunch incl protein at 2:00 -in agreement  Update if no imp  A1C today  microalb as well (cannot take ace)  Disc foot /eye/heart care       Relevant Medications   metFORMIN (GLUCOPHAGE) 1000 MG tablet   Other Relevant Orders   Hemoglobin A1c (Completed)   Microalbumin / creatinine urine ratio (Completed)

## 2017-08-28 NOTE — Assessment & Plan Note (Signed)
No new symptoms.

## 2017-08-28 NOTE — Assessment & Plan Note (Signed)
Pt refuses to go back to endocrinology since she cannot afford the copay Taking glipizide and metformin -doing better  Some hypoglycemia at 3:30 pm - due to skipping lunch  Outlined a new meal plan with small lunch incl protein at 2:00 -in agreement  Update if no imp  A1C today  microalb as well (cannot take ace)  Disc foot /eye/heart care

## 2017-08-28 NOTE — Assessment & Plan Note (Signed)
No new symptoms Also carotid stenosis On statin and aggrenox  Working on DM control Ref to cardiology for re check of CAD

## 2017-08-28 NOTE — Assessment & Plan Note (Signed)
Controlled with atorvastatin  Disc goals for lipids and reasons to control them Rev labs with pt Rev low sat fat diet in detail She c/o of some muscle pain  Offered a change to crestor to see if this is better She will check on pricing of that

## 2017-08-28 NOTE — Assessment & Plan Note (Signed)
Pt states this is stable with eye exam fall 2018 DM is now better controlled Will send for that exam

## 2017-08-28 NOTE — Assessment & Plan Note (Signed)
bp in fair control at this time  BP Readings from Last 1 Encounters:  08/27/17 130/66   No changes needed Disc lifstyle change with low sodium diet and exercise  Labs today

## 2017-08-29 ENCOUNTER — Telehealth: Payer: Self-pay

## 2017-08-29 ENCOUNTER — Telehealth: Payer: Self-pay | Admitting: Family Medicine

## 2017-08-29 NOTE — Telephone Encounter (Signed)
Tower, Wynelle Fanny, MD  McNair, Shapale M, CMA        Cholesterol is stable but HDL is down- more exercise will help this  Try to drink water for kidney health  Diabetes control is better with A1C of 6.3  F/u in 6 mo for annual exam (with amw prior if open to it)  If she declines health mt then just a 30 min f/u with lab prior    LMOVM for pt to call the office to discuss lab results and to schedule annual exam for 70-months out/thx dmf

## 2017-08-29 NOTE — Telephone Encounter (Signed)
Pt given results per notes of Dr. Glori Bickers on 08/29/17 Unable to document in result note due to result note not being routed to [facility].  Note    Tower, Wynelle Fanny, MD  Oswaldo Milian, Shapale M, CMA        Cholesterol is stable but HDL is down- more exercise will help this  Try to drink water for kidney health  Diabetes control is better with A1C of 6.3  F/u in 6 mo for annual exam (with amw prior if open to it)  If she declines health mt then just a 30 min f/u with lab prior    LMOVM for pt to call the office to discuss lab results and to schedule annual exam for 23-months out/thx dmf

## 2017-09-29 ENCOUNTER — Encounter: Payer: Self-pay | Admitting: *Deleted

## 2017-10-03 NOTE — Progress Notes (Signed)
Cardiology Office Note  Date:  10/06/2017   ID:  Stephanie [name], DOB 1933-12-07, MRN [id number]  PCP:  Abner Greenspan, MD   Chief Complaint  Patient presents with  . New Patient (Initial Visit)     Per M. Tower due to establish care since she has 2 stents placed. Patient has not seen a heart doctor in a few years. Meds reviewed verbally with patient.     HPI:  Stephanie [name] is a 84 year old woman with past medical history of Diabetes PAD CAD, stent x 2 PTCA stenting of the proximal LAD  PTCA of the diagonal 1 lesion performed in August 2005. Carotid stenosis, 40% b/l in 11/2016 HTN ? TIA (10/2016, couldn't talk; tongue issues ~56min and then went away) Presenting by referral from Dr. Glori Bickers for evaluation of her CAD and PAD  Long discussion concerning her previous cardiac history Back in August 2005 had chest pains after picking beans Central middle chest pain, pressure Went to the hospital, cardiac catheterization Stent x2 placed to the LAD and diagonal vessel  Since then reports doing well with no chest pain symptoms Relatively active at baseline  Reports having TIA April 2018 symptoms lasting 5 minutes No further episodes since that time Workup included echocardiogram showing normal LV function   carotid ultrasound with mild bilateral disease By her report no event monitor performed  Echo 11/2016: EF 55-60%, grade 1 DD with elevated LV end diastolic filling pressures, mildly dilated LA, mild TR and PR.  EKG personally reviewed by myself on todays visit Shows normal sinus rhythm rate 60 bpm no significant ST or T wave changes   PMH:   has a past medical history of Allergy, Anxiety, Arthritis, Degenerative disk disease, Diabetes mellitus, Hyperlipidemia, Hypertension, Hypoaldosteronism ([place]) (6/12), Osteopenia, Periodontal disease, Peripheral vascular disease ([place]), and Transient cerebral ischemia (11/06/2016).  PSH:    Past Surgical History:  Procedure Laterality Date   . CARDIAC CATHETERIZATION     x2 STENT  . CHOLECYSTECTOMY    . ESOPHAGOGASTRODUODENOSCOPY  03/2004   gastritis by biopsy  . GUM SURGERY  06/2010   laser surgery for gums  . MM BREAST STEREO BX*L*R/S  12/12   normal  . TONSILLECTOMY    . TRANSTHORACIC ECHOCARDIOGRAM  11/2016   EF 55-60%, grade 1 DD with elevated LV end diastolic filling pressures, mildly dilated LA, mild TR and PR.   . TUBAL LIGATION      Current Outpatient Medications  Medication Sig Dispense Refill  . amLODipine (NORVASC) 5 MG tablet Take 1 tablet (5 mg total) by mouth daily. 30 tablet 11  . atorvastatin (LIPITOR) 20 MG tablet Take 1 tablet (20 mg total) by mouth daily. 90 tablet 3  . chlorthalidone (HYGROTON) 25 MG tablet Take 1 tablet (25 mg total) by mouth daily. 90 tablet 3  . diphenhydramine-acetaminophen (TYLENOL PM EXTRA STRENGTH) 25-500 MG TABS Take 1 tablet by mouth at bedtime as needed.      . dipyridamole-aspirin (AGGRENOX) 200-25 MG 12hr capsule Take 1 capsule by mouth 2 (two) times daily. 180 capsule 3  . GLIPIZIDE XL 10 MG 24 hr tablet TAKE 1 TABLET BY MOUTH ONCE DAILY WITH BREAKFAST 90 tablet 1  . glucose blood (ACCU-CHEK SMARTVIEW) test strip USE ONE STRIP TO TEST BLOOD SUGAR TWICE  DAILY FOR POORLY CONTROLLED dm TYPE 2 100 each 3  . glucose blood (FREESTYLE TEST STRIPS) test strip Use as instructed 100 each 12  . metFORMIN (GLUCOPHAGE) 1000 MG tablet Take 1  tablet (1,000 mg total) by mouth 2 (two) times daily with a meal. 180 tablet 3  . metoprolol succinate (TOPROL-XL) 100 MG 24 hr tablet TAKE 1 TABLET BY MOUTH ONCE DAILY TAKE  WITH  OR  IMMEDIATELY  FOLLOWING  A  MEAL 90 tablet 3  . PARoxetine (PAXIL) 40 MG tablet Take 1 tablet (40 mg total) by mouth daily. 90 tablet 3  . ranitidine (ZANTAC) 150 MG tablet Take 150 mg by mouth 2 (two) times daily.     No current facility-administered medications for this visit.      Allergies:   Ace inhibitors; Amlodipine besy-benazepril hcl; Clopidogrel  bisulfate; and Erythromycin   Social History:  The patient  reports that she has never smoked. She has never used smokeless tobacco. She reports that she does not drink alcohol or use drugs.   Family History:   family history includes Cancer in her mother; Diabetes in her father; Heart disease in her father, mother, and son; Hypertension in her mother.    Review of Systems: Review of Systems  Constitutional: Negative.   Respiratory: Negative.   Cardiovascular: Negative.   Gastrointestinal: Negative.   Musculoskeletal: Negative.   Neurological: Negative.   Psychiatric/Behavioral: Negative.   All other systems reviewed and are negative.    PHYSICAL EXAM: VS:  BP (!) 142/54 (BP Location: Right Arm, Patient Position: Sitting, Cuff Size: Normal)   Pulse 60   Ht 5\' 1"  (1.549 m)   Wt 110 lb (49.9 kg)   BMI 20.78 kg/m  , BMI Body mass index is 20.78 kg/m. GEN: Well nourished, well developed, in no acute distress  HEENT: normal  Neck: no JVD, carotid bruits, or masses Cardiac: RRR; no murmurs, rubs, or gallops,no edema  Respiratory:  clear to auscultation bilaterally, normal work of breathing GI: soft, nontender, nondistended, + BS MS: no deformity or atrophy  Skin: warm and dry, no rash Neuro:  Strength and sensation are intact Psych: euthymic mood, full affect    Recent Labs: 08/27/2017: ALT 6; BUN 22; Creatinine, Ser 1.04; Potassium 4.9; Sodium 135    Lipid Panel Lab Results  Component Value Date   CHOL 107 08/27/2017   HDL 36.90 (L) 08/27/2017   LDLCALC 37 08/27/2017   TRIG 166.0 (H) 08/27/2017      Wt Readings from Last 3 Encounters:  10/06/17 110 lb (49.9 kg)  08/27/17 109 lb 4 oz (49.6 kg)  04/10/17 110 lb (49.9 kg)       ASSESSMENT AND PLAN:  Atherosclerosis of native coronary artery of native heart with stable angina pectoris ([place]) - Plan: EKG 12-Lead Currently with no symptoms of angina. No further workup at this time. Continue current medication  regimen.  Long discussion concerning anginal symptoms  Peripheral vascular disease due to secondary diabetes mellitus ([place]) Mild bilateral carotid disease noted in 2018 No significant bruits on exam Stressed importance of aggressive lipid control   Carotid stenosis, bilateral Mild bilateral disease 1 years ago in the setting of TIA  Transient cerebral ischemia, unspecified type Long discussion concerning symptoms We did discuss importance of close monitoring Husband has atrial fibrillation, discussed that if she has any further TIA symptoms we would recommend a monitor/even a reveal/loop device  Mixed hyperlipidemia Cholesterol is at goal on the current lipid regimen. No changes to the medications were made.  Essential hypertension Blood pressure is well controlled on today's visit. No changes made to the medications.   Disposition:   F/U 1 year   Total encounter  time more than 60 minutes  Greater than 50% was spent in counseling and coordination of care with the patient    Orders Placed This Encounter  Procedures  . EKG 12-Lead     Signed, Esmond Plants, M.D., Ph.D. 10/06/2017  [place], [place]

## 2017-10-06 ENCOUNTER — Ambulatory Visit: Payer: Medicare Other | Admitting: Cardiovascular Disease

## 2017-10-06 ENCOUNTER — Encounter: Payer: Self-pay | Admitting: Cardiovascular Disease

## 2017-10-06 VITALS — BP 142/54 | HR 60 | Ht 61.0 in | Wt 110.0 lb

## 2017-10-06 DIAGNOSIS — E1351 Other specified diabetes mellitus with diabetic peripheral angiopathy without gangrene: Secondary | ICD-10-CM

## 2017-10-06 DIAGNOSIS — G459 Transient cerebral ischemic attack, unspecified: Secondary | ICD-10-CM

## 2017-10-06 DIAGNOSIS — I10 Essential (primary) hypertension: Secondary | ICD-10-CM | POA: Diagnosis not present

## 2017-10-06 DIAGNOSIS — E1169 Type 2 diabetes mellitus with other specified complication: Secondary | ICD-10-CM

## 2017-10-06 DIAGNOSIS — E782 Mixed hyperlipidemia: Secondary | ICD-10-CM | POA: Diagnosis not present

## 2017-10-06 DIAGNOSIS — I25118 Atherosclerotic heart disease of native coronary artery with other forms of angina pectoris: Secondary | ICD-10-CM | POA: Diagnosis not present

## 2017-10-06 DIAGNOSIS — I6523 Occlusion and stenosis of bilateral carotid arteries: Secondary | ICD-10-CM | POA: Diagnosis not present

## 2017-10-06 NOTE — Patient Instructions (Signed)

## 2017-11-11 ENCOUNTER — Encounter: Payer: Self-pay | Admitting: Primary Care

## 2017-11-11 ENCOUNTER — Ambulatory Visit (INDEPENDENT_AMBULATORY_CARE_PROVIDER_SITE_OTHER): Payer: Medicare Other | Admitting: Primary Care

## 2017-11-11 ENCOUNTER — Ambulatory Visit (INDEPENDENT_AMBULATORY_CARE_PROVIDER_SITE_OTHER)
Admission: RE | Admit: 2017-11-11 | Discharge: 2017-11-11 | Disposition: A | Discharge status: Home / Self Care | Payer: Medicare Other | Source: Ambulatory Visit | Attending: Primary Care | Admitting: Primary Care

## 2017-11-11 VITALS — BP 116/68 | HR 65 | Temp 97.9°F | Ht 61.0 in | Wt 107.5 lb

## 2017-11-11 DIAGNOSIS — J22 Unspecified acute lower respiratory infection: Secondary | ICD-10-CM

## 2017-11-11 DIAGNOSIS — R05 Cough: Secondary | ICD-10-CM | POA: Diagnosis not present

## 2017-11-11 MED ORDER — AMOXICILLIN-POT CLAVULANATE 875-125 MG PO TABS: 1.0000 | ORAL_TABLET | Freq: Two times a day (BID) | ORAL | 0 refills | Status: DC

## 2017-11-11 MED ORDER — GUAIFENESIN-CODEINE 100-10 MG/5ML PO SYRP: 5.0000 mL | ORAL_SOLUTION | Freq: Every evening | ORAL | 0 refills | Status: DC | PRN

## 2017-11-11 NOTE — Progress Notes (Signed)
Subjective:    Patient ID: Stephanie [name], female    DOB: 07/18/1933, 84 y.o.   MRN: [id number]  HPI  Stephanie [name] is an 84 year old female with a history of allergic rhinitis, type 2 diabetes, hypertension, GERD who presents today with a chief complaint of cough.  She also reports sore throat (initially), nasal congestion, fatigue, chest congestion. Her symptoms began three weeks ago. Her cough is productive with thick, green mucous. Her cough is worse at night.   She's taken Zyrtec, Tylenol, Robitussin without much improvement. Overall she's feeling about the same as when her symptoms began. She denies shortness of breath, chest pain, fevers.   Review of Systems  Constitutional: Positive for fatigue. Negative for chills and fever.  HENT: Positive for congestion. Negative for ear pain, sinus pressure and sore throat.   Respiratory: Positive for cough and chest tightness. Negative for shortness of breath.   Cardiovascular: Negative for chest pain.  Allergic/Immunologic: Positive for environmental allergies.       Past Medical History:  Diagnosis Date  . Allergy    allergic rhinitis  . Anxiety   . Arthritis    osteoarthritis  . Degenerative disk disease    in neck  . Diabetes mellitus    type II  . Hyperlipidemia   . Hypertension   . Hypoaldosteronism ([place]) 6/12   from DM causing hyperkalemia   . Osteopenia   . Periodontal disease   . Peripheral vascular disease ([place])   . Transient cerebral ischemia 11/06/2016     Social History   Socioeconomic History  . Marital status: Married    Spouse name: Not on file  . Number of children: Not on file  . Years of education: Not on file  . Highest education level: Not on file  Occupational History  . Not on file  Social Needs  . Financial resource strain: Not on file  . Food insecurity:    Worry: Not on file    Inability: Not on file  . Transportation needs:    Medical: Not on file    Non-medical: Not on file  Tobacco Use   . Smoking status: Never Smoker  . Smokeless tobacco: Never Used  Substance and Sexual Activity  . Alcohol use: No    Alcohol/week: 0.0 oz  . Drug use: No  . Sexual activity: Not on file  Lifestyle  . Physical activity:    Days per week: Not on file    Minutes per session: Not on file  . Stress: Not on file  Relationships  . Social connections:    Talks on phone: Not on file    Gets together: Not on file    Attends religious service: Not on file    Active member of club or organization: Not on file    Attends meetings of clubs or organizations: Not on file    Relationship status: Not on file  . Intimate partner violence:    Fear of current or ex partner: Not on file    Emotionally abused: Not on file    Physically abused: Not on file    Forced sexual activity: Not on file  Other Topics Concern  . Not on file  Social History Narrative  . Not on file    Past Surgical History:  Procedure Laterality Date  . CARDIAC CATHETERIZATION     x2 STENT  . CHOLECYSTECTOMY    . ESOPHAGOGASTRODUODENOSCOPY  03/2004   gastritis by biopsy  . GUM  SURGERY  06/2010   laser surgery for gums  . MM BREAST STEREO BX*L*R/S  12/12   normal  . TONSILLECTOMY    . TRANSTHORACIC ECHOCARDIOGRAM  11/2016   EF 55-60%, grade 1 DD with elevated LV end diastolic filling pressures, mildly dilated LA, mild TR and PR.   . TUBAL LIGATION      Family History  Problem Relation Age of Onset  . Cancer Mother        vaginal CA in situ  . Hypertension Mother   . Heart disease Mother        CAD and MI  . Heart disease Father        CAD  . Diabetes Father   . Heart disease Son        congenital valve problem    Allergies  Allergen Reactions  . Ace Inhibitors     REACTION: generic, reaction not known  . Amlodipine Besy-Benazepril Hcl     REACTION: chest pain  . Clopidogrel Bisulfate     REACTION: GI  . Erythromycin     REACTION: GI upset    Current Outpatient Medications on File Prior to  Visit  Medication Sig Dispense Refill  . amLODipine (NORVASC) 5 MG tablet Take 1 tablet (5 mg total) by mouth daily. 30 tablet 11  . atorvastatin (LIPITOR) 20 MG tablet Take 1 tablet (20 mg total) by mouth daily. 90 tablet 3  . chlorthalidone (HYGROTON) 25 MG tablet Take 1 tablet (25 mg total) by mouth daily. 90 tablet 3  . diphenhydramine-acetaminophen (TYLENOL PM EXTRA STRENGTH) 25-500 MG TABS Take 1 tablet by mouth at bedtime as needed.      . dipyridamole-aspirin (AGGRENOX) 200-25 MG 12hr capsule Take 1 capsule by mouth 2 (two) times daily. 180 capsule 3  . GLIPIZIDE XL 10 MG 24 hr tablet TAKE 1 TABLET BY MOUTH ONCE DAILY WITH BREAKFAST 90 tablet 1  . glucose blood (ACCU-CHEK SMARTVIEW) test strip USE ONE STRIP TO TEST BLOOD SUGAR TWICE  DAILY FOR POORLY CONTROLLED dm TYPE 2 100 each 3  . glucose blood (FREESTYLE TEST STRIPS) test strip Use as instructed 100 each 12  . metFORMIN (GLUCOPHAGE) 1000 MG tablet Take 1 tablet (1,000 mg total) by mouth 2 (two) times daily with a meal. 180 tablet 3  . metoprolol succinate (TOPROL-XL) 100 MG 24 hr tablet TAKE 1 TABLET BY MOUTH ONCE DAILY TAKE  WITH  OR  IMMEDIATELY  FOLLOWING  A  MEAL 90 tablet 3  . PARoxetine (PAXIL) 40 MG tablet Take 1 tablet (40 mg total) by mouth daily. 90 tablet 3  . ranitidine (ZANTAC) 150 MG tablet Take 150 mg by mouth 2 (two) times daily.     No current facility-administered medications on file prior to visit.     BP 116/68   Pulse 65   Temp 97.9 F (36.6 C) (Oral)   Ht 5\' 1"  (1.549 m)   Wt 107 lb 8 oz (48.8 kg)   SpO2 95%   BMI 20.31 kg/m    Objective:   Physical Exam  Constitutional: She appears well-nourished.  HENT:  Right Ear: Tympanic membrane and ear canal normal.  Left Ear: Tympanic membrane and ear canal normal.  Nose: Right sinus exhibits no maxillary sinus tenderness and no frontal sinus tenderness. Left sinus exhibits no maxillary sinus tenderness and no frontal sinus tenderness.  Mouth/Throat:  Oropharynx is clear and moist.  Eyes: Conjunctivae are normal.  Neck: Neck supple.  Cardiovascular: Normal rate and  regular rhythm.  Pulmonary/Chest: Effort normal. She has decreased breath sounds in the left lower field. She has no wheezes. She has rhonchi in the right upper field. She has no rales.  Lymphadenopathy:    She has no cervical adenopathy.  Skin: Skin is warm and dry.          Assessment & Plan:  Acute Lower Respiratory Infection:  Cough, congestion x 3 weeks, no improvement with OTC treatment.  Exam today with diminished breath sounds to LLL concerning for pneumonia, mild rhonchi to RUL. Appears stable. Chest xray pending to rule out pneumonia. Given duration of symptoms coupled with breath sounds, will treat. Rx for Augmentin course and Cheratussin HS sent to pharmacy. Fluids, rest, follow up PRN.  Pleas Koch, NP

## 2017-11-11 NOTE — Patient Instructions (Signed)
Complete xray(s) prior to leaving today. I will notify you of your results once received.  Start Augmentin antibiotics for the infection Take 1 tablet by mouth twice daily for 10 days.  You may take the cough suppressant at bedtime as needed for cough and rest. Caution this medication contains codeine and will make you feel drowsy.  Ensure you are getting plenty of rest and water.  It was a pleasure meeting you!

## 2018-01-21 ENCOUNTER — Inpatient Hospital Stay (HOSPITAL_COMMUNITY): Payer: Medicare Other | Admitting: Anesthesiology

## 2018-01-21 ENCOUNTER — Encounter (HOSPITAL_COMMUNITY): Payer: Self-pay

## 2018-01-21 ENCOUNTER — Inpatient Hospital Stay (HOSPITAL_COMMUNITY)
Admission: EM | Admit: 2018-01-21 | Discharge: 2018-01-24 | DRG: 470 | Disposition: A | Discharge status: Home / Self Care | Payer: Medicare Other | Attending: Internal Medicine | Admitting: Internal Medicine

## 2018-01-21 ENCOUNTER — Encounter (HOSPITAL_COMMUNITY)
Admission: EM | Disposition: A | Discharge status: Home / Self Care | Payer: Self-pay | Source: Home / Self Care | Attending: Internal Medicine

## 2018-01-21 ENCOUNTER — Emergency Department (HOSPITAL_COMMUNITY): Payer: Medicare Other

## 2018-01-21 ENCOUNTER — Inpatient Hospital Stay (HOSPITAL_COMMUNITY): Payer: Medicare Other

## 2018-01-21 DIAGNOSIS — S72002A Fracture of unspecified part of neck of left femur, initial encounter for closed fracture: Secondary | ICD-10-CM

## 2018-01-21 DIAGNOSIS — I129 Hypertensive chronic kidney disease with stage 1 through stage 4 chronic kidney disease, or unspecified chronic kidney disease: Secondary | ICD-10-CM | POA: Diagnosis present

## 2018-01-21 DIAGNOSIS — Z8781 Personal history of (healed) traumatic fracture: Secondary | ICD-10-CM

## 2018-01-21 DIAGNOSIS — I10 Essential (primary) hypertension: Secondary | ICD-10-CM | POA: Diagnosis not present

## 2018-01-21 DIAGNOSIS — G459 Transient cerebral ischemic attack, unspecified: Secondary | ICD-10-CM

## 2018-01-21 DIAGNOSIS — Z794 Long term (current) use of insulin: Secondary | ICD-10-CM

## 2018-01-21 DIAGNOSIS — I2510 Atherosclerotic heart disease of native coronary artery without angina pectoris: Secondary | ICD-10-CM | POA: Diagnosis not present

## 2018-01-21 DIAGNOSIS — E1122 Type 2 diabetes mellitus with diabetic chronic kidney disease: Secondary | ICD-10-CM | POA: Diagnosis not present

## 2018-01-21 DIAGNOSIS — N182 Chronic kidney disease, stage 2 (mild): Secondary | ICD-10-CM | POA: Diagnosis not present

## 2018-01-21 DIAGNOSIS — E875 Hyperkalemia: Secondary | ICD-10-CM | POA: Diagnosis present

## 2018-01-21 DIAGNOSIS — W19XXXA Unspecified fall, initial encounter: Secondary | ICD-10-CM | POA: Diagnosis not present

## 2018-01-21 DIAGNOSIS — D631 Anemia in chronic kidney disease: Secondary | ICD-10-CM | POA: Diagnosis present

## 2018-01-21 DIAGNOSIS — K219 Gastro-esophageal reflux disease without esophagitis: Secondary | ICD-10-CM | POA: Diagnosis not present

## 2018-01-21 DIAGNOSIS — W010XXA Fall on same level from slipping, tripping and stumbling without subsequent striking against object, initial encounter: Secondary | ICD-10-CM | POA: Diagnosis present

## 2018-01-21 DIAGNOSIS — F32A Depression, unspecified: Secondary | ICD-10-CM | POA: Diagnosis present

## 2018-01-21 DIAGNOSIS — N179 Acute kidney failure, unspecified: Secondary | ICD-10-CM | POA: Diagnosis present

## 2018-01-21 DIAGNOSIS — Z8673 Personal history of transient ischemic attack (TIA), and cerebral infarction without residual deficits: Secondary | ICD-10-CM

## 2018-01-21 DIAGNOSIS — E860 Dehydration: Secondary | ICD-10-CM | POA: Diagnosis not present

## 2018-01-21 DIAGNOSIS — E1151 Type 2 diabetes mellitus with diabetic peripheral angiopathy without gangrene: Secondary | ICD-10-CM | POA: Diagnosis present

## 2018-01-21 DIAGNOSIS — S0990XA Unspecified injury of head, initial encounter: Secondary | ICD-10-CM | POA: Diagnosis not present

## 2018-01-21 DIAGNOSIS — Z888 Allergy status to other drugs, medicaments and biological substances status: Secondary | ICD-10-CM | POA: Diagnosis not present

## 2018-01-21 DIAGNOSIS — E1165 Type 2 diabetes mellitus with hyperglycemia: Secondary | ICD-10-CM | POA: Diagnosis not present

## 2018-01-21 DIAGNOSIS — D62 Acute posthemorrhagic anemia: Secondary | ICD-10-CM | POA: Diagnosis not present

## 2018-01-21 DIAGNOSIS — Z471 Aftercare following joint replacement surgery: Secondary | ICD-10-CM | POA: Diagnosis not present

## 2018-01-21 DIAGNOSIS — S199XXA Unspecified injury of neck, initial encounter: Secondary | ICD-10-CM | POA: Diagnosis not present

## 2018-01-21 DIAGNOSIS — S72012A Unspecified intracapsular fracture of left femur, initial encounter for closed fracture: Secondary | ICD-10-CM | POA: Diagnosis not present

## 2018-01-21 DIAGNOSIS — S7292XA Unspecified fracture of left femur, initial encounter for closed fracture: Secondary | ICD-10-CM | POA: Diagnosis not present

## 2018-01-21 DIAGNOSIS — S299XXA Unspecified injury of thorax, initial encounter: Secondary | ICD-10-CM | POA: Diagnosis not present

## 2018-01-21 DIAGNOSIS — F329 Major depressive disorder, single episode, unspecified: Secondary | ICD-10-CM | POA: Diagnosis present

## 2018-01-21 DIAGNOSIS — E785 Hyperlipidemia, unspecified: Secondary | ICD-10-CM | POA: Diagnosis not present

## 2018-01-21 DIAGNOSIS — E11319 Type 2 diabetes mellitus with unspecified diabetic retinopathy without macular edema: Secondary | ICD-10-CM | POA: Diagnosis not present

## 2018-01-21 DIAGNOSIS — Z881 Allergy status to other antibiotic agents status: Secondary | ICD-10-CM | POA: Diagnosis not present

## 2018-01-21 DIAGNOSIS — Z96642 Presence of left artificial hip joint: Secondary | ICD-10-CM | POA: Diagnosis not present

## 2018-01-21 DIAGNOSIS — Z9889 Other specified postprocedural states: Secondary | ICD-10-CM

## 2018-01-21 DIAGNOSIS — S72012D Unspecified intracapsular fracture of left femur, subsequent encounter for closed fracture with routine healing: Secondary | ICD-10-CM | POA: Diagnosis not present

## 2018-01-21 DIAGNOSIS — E119 Type 2 diabetes mellitus without complications: Secondary | ICD-10-CM | POA: Diagnosis present

## 2018-01-21 HISTORY — DX: Fracture of unspecified part of neck of left femur, initial encounter for closed fracture: S72.002A

## 2018-01-21 HISTORY — PX: HIP ARTHROPLASTY: SHX981

## 2018-01-21 LAB — CBC WITH DIFFERENTIAL/PLATELET
Abs Immature Granulocytes: 0.1 10*3/uL (ref 0.0–0.1)
BASOS ABS: 0 10*3/uL (ref 0.0–0.1)
Basophils Relative: 0 %
EOS ABS: 0.1 10*3/uL (ref 0.0–0.7)
EOS PCT: 1 %
HEMATOCRIT: 32.8 % — AB (ref 36.0–46.0)
Hemoglobin: 10.8 g/dL — ABNORMAL LOW (ref 12.0–15.0)
Immature Granulocytes: 1 %
Lymphocytes Relative: 30 %
Lymphs Abs: 2.9 10*3/uL (ref 0.7–4.0)
MCH: 31 pg (ref 26.0–34.0)
MCHC: 32.9 g/dL (ref 30.0–36.0)
MCV: 94.3 fL (ref 78.0–100.0)
Monocytes Absolute: 0.5 10*3/uL (ref 0.1–1.0)
Monocytes Relative: 5 %
Neutro Abs: 6.1 10*3/uL (ref 1.7–7.7)
Neutrophils Relative %: 63 %
Platelets: 204 10*3/uL (ref 150–400)
RBC: 3.48 MIL/uL — AB (ref 3.87–5.11)
RDW: 13.2 % (ref 11.5–15.5)
WBC: 9.6 10*3/uL (ref 4.0–10.5)

## 2018-01-21 LAB — BASIC METABOLIC PANEL
Anion gap: 9 (ref 5–15)
BUN: 18 mg/dL (ref 8–23)
CO2: 23 mmol/L (ref 22–32)
CREATININE: 1.02 mg/dL — AB (ref 0.44–1.00)
Calcium: 9.7 mg/dL (ref 8.9–10.3)
Chloride: 106 mmol/L (ref 98–111)
GFR calc Af Amer: 58 mL/min — ABNORMAL LOW (ref >60)
GFR, EST NON AFRICAN AMERICAN: 50 mL/min — AB (ref >60)
Glucose, Bld: 164 mg/dL — ABNORMAL HIGH (ref 70–99)
Potassium: 4.7 mmol/L (ref 3.5–5.1)
SODIUM: 138 mmol/L (ref 135–145)

## 2018-01-21 LAB — GLUCOSE, CAPILLARY
GLUCOSE-CAPILLARY: 177 mg/dL — AB (ref 70–99)
Glucose-Capillary: 153 mg/dL — ABNORMAL HIGH (ref 70–99)
Glucose-Capillary: 223 mg/dL — ABNORMAL HIGH (ref 70–99)

## 2018-01-21 LAB — PROTIME-INR
INR: 1.09
PROTHROMBIN TIME: 14 s (ref 11.4–15.2)

## 2018-01-21 LAB — ABO/RH: ABO/RH(D): A NEG

## 2018-01-21 LAB — BRAIN NATRIURETIC PEPTIDE: B NATRIURETIC PEPTIDE 5: 219.2 pg/mL — AB (ref 0.0–100.0)

## 2018-01-21 SURGERY — HEMIARTHROPLASTY, HIP, DIRECT ANTERIOR APPROACH, FOR FRACTURE
Anesthesia: General | Site: Hip | Laterality: Left

## 2018-01-21 MED ORDER — BUPIVACAINE HCL (PF) 0.25 % IJ SOLN
INTRAMUSCULAR | Status: DC | PRN
  Administered 2018-01-21: 30 mL

## 2018-01-21 MED ORDER — BISACODYL 10 MG RE SUPP
10.0000 mg | Freq: Every day | RECTAL | Status: DC | PRN
Start: 2018-01-21 — End: 2018-01-24

## 2018-01-21 MED ORDER — CEFAZOLIN SODIUM-DEXTROSE 2-4 GM/100ML-% IV SOLN
INTRAVENOUS | Status: AC
  Filled 2018-01-21: qty 100

## 2018-01-21 MED ORDER — LACTATED RINGERS IV SOLN
INTRAVENOUS | Status: DC
  Administered 2018-01-21: 18:00:00 via INTRAVENOUS

## 2018-01-21 MED ORDER — INSULIN ASPART 100 UNIT/ML ~~LOC~~ SOLN: 0.0000 [IU] | Freq: Three times a day (TID) | SUBCUTANEOUS | Status: DC

## 2018-01-21 MED ORDER — PROPOFOL 10 MG/ML IV BOLUS
INTRAVENOUS | Status: DC | PRN
  Administered 2018-01-21: 100 mg via INTRAVENOUS

## 2018-01-21 MED ORDER — FAMOTIDINE 20 MG PO TABS
20.0000 mg | ORAL_TABLET | Freq: Every day | ORAL | Status: DC
  Administered 2018-01-21 – 2018-01-24 (×3): 20 mg via ORAL
  Filled 2018-01-21 (×4): qty 1

## 2018-01-21 MED ORDER — MORPHINE SULFATE (PF) 2 MG/ML IV SOLN: 0.5000 mg | INTRAVENOUS | Status: DC | PRN

## 2018-01-21 MED ORDER — METOPROLOL SUCCINATE ER 25 MG PO TB24
ORAL_TABLET | ORAL | Status: AC
  Filled 2018-01-21: qty 4

## 2018-01-21 MED ORDER — ROCURONIUM BROMIDE 10 MG/ML (PF) SYRINGE
PREFILLED_SYRINGE | INTRAVENOUS | Status: AC
  Filled 2018-01-21: qty 20

## 2018-01-21 MED ORDER — ONDANSETRON HCL 4 MG/2ML IJ SOLN
INTRAMUSCULAR | Status: DC | PRN
  Administered 2018-01-21: 4 mg via INTRAVENOUS

## 2018-01-21 MED ORDER — MAGNESIUM CITRATE PO SOLN: 1.0000 | Freq: Once | ORAL | Status: DC | PRN

## 2018-01-21 MED ORDER — ONDANSETRON HCL 4 MG PO TABS: 4.0000 mg | ORAL_TABLET | Freq: Four times a day (QID) | ORAL | Status: DC | PRN

## 2018-01-21 MED ORDER — POLYETHYLENE GLYCOL 3350 17 G PO PACK: 17.0000 g | PACK | Freq: Every day | ORAL | Status: DC | PRN

## 2018-01-21 MED ORDER — GLIPIZIDE ER 10 MG PO TB24
10.0000 mg | ORAL_TABLET | Freq: Every day | ORAL | Status: DC
  Filled 2018-01-21: qty 1

## 2018-01-21 MED ORDER — HYDROCODONE-ACETAMINOPHEN 5-325 MG PO TABS
1.0000 | ORAL_TABLET | ORAL | Status: DC | PRN
  Administered 2018-01-21: 2 via ORAL
  Filled 2018-01-21: qty 2

## 2018-01-21 MED ORDER — MEPERIDINE HCL 50 MG/ML IJ SOLN: 6.2500 mg | INTRAMUSCULAR | Status: DC | PRN

## 2018-01-21 MED ORDER — MORPHINE SULFATE (PF) 4 MG/ML IV SOLN
4.0000 mg | Freq: Once | INTRAVENOUS | Status: AC
  Administered 2018-01-21: 4 mg via INTRAVENOUS
  Filled 2018-01-21: qty 1

## 2018-01-21 MED ORDER — METFORMIN HCL 500 MG PO TABS: 1000.0000 mg | ORAL_TABLET | Freq: Two times a day (BID) | ORAL | Status: DC

## 2018-01-21 MED ORDER — BUPIVACAINE HCL (PF) 0.25 % IJ SOLN
INTRAMUSCULAR | Status: AC
  Filled 2018-01-21: qty 30

## 2018-01-21 MED ORDER — LIDOCAINE 2% (20 MG/ML) 5 ML SYRINGE
INTRAMUSCULAR | Status: DC | PRN
  Administered 2018-01-21: 100 mg via INTRAVENOUS

## 2018-01-21 MED ORDER — ONDANSETRON HCL 4 MG/2ML IJ SOLN
4.0000 mg | Freq: Four times a day (QID) | INTRAMUSCULAR | Status: DC | PRN
Start: 2018-01-21 — End: 2018-01-24

## 2018-01-21 MED ORDER — ALUM & MAG HYDROXIDE-SIMETH 200-200-20 MG/5ML PO SUSP: 30.0000 mL | ORAL | Status: DC | PRN

## 2018-01-21 MED ORDER — CEFAZOLIN SODIUM-DEXTROSE 2-4 GM/100ML-% IV SOLN
2.0000 g | INTRAVENOUS | Status: AC
  Administered 2018-01-21: 2 g via INTRAVENOUS

## 2018-01-21 MED ORDER — POTASSIUM CHLORIDE IN NACL 20-0.9 MEQ/L-% IV SOLN
INTRAVENOUS | Status: DC
  Administered 2018-01-21: 22:00:00 via INTRAVENOUS
  Filled 2018-01-21: qty 1000

## 2018-01-21 MED ORDER — ROCURONIUM BROMIDE 10 MG/ML (PF) SYRINGE
PREFILLED_SYRINGE | INTRAVENOUS | Status: DC | PRN
  Administered 2018-01-21: 40 mg via INTRAVENOUS

## 2018-01-21 MED ORDER — ACETAMINOPHEN 325 MG PO TABS: 650.0000 mg | ORAL_TABLET | Freq: Four times a day (QID) | ORAL | 1 refills | Status: DC | PRN

## 2018-01-21 MED ORDER — DEXAMETHASONE SODIUM PHOSPHATE 10 MG/ML IJ SOLN
INTRAMUSCULAR | Status: DC | PRN
  Administered 2018-01-21: 5 mg via INTRAVENOUS

## 2018-01-21 MED ORDER — CHLORTHALIDONE 25 MG PO TABS
25.0000 mg | ORAL_TABLET | Freq: Every day | ORAL | Status: DC
  Filled 2018-01-21: qty 1

## 2018-01-21 MED ORDER — SUGAMMADEX SODIUM 200 MG/2ML IV SOLN
INTRAVENOUS | Status: DC | PRN
  Administered 2018-01-21: 100 mg via INTRAVENOUS

## 2018-01-21 MED ORDER — CHLORHEXIDINE GLUCONATE 4 % EX LIQD: 60.0000 mL | Freq: Once | CUTANEOUS | Status: DC

## 2018-01-21 MED ORDER — FERROUS SULFATE 325 (65 FE) MG PO TABS
325.0000 mg | ORAL_TABLET | Freq: Three times a day (TID) | ORAL | Status: DC
  Administered 2018-01-22 – 2018-01-24 (×7): 325 mg via ORAL
  Filled 2018-01-21 (×8): qty 1

## 2018-01-21 MED ORDER — FENTANYL CITRATE (PF) 250 MCG/5ML IJ SOLN
INTRAMUSCULAR | Status: DC | PRN
  Administered 2018-01-21: 75 ug via INTRAVENOUS
  Administered 2018-01-21: 50 ug via INTRAVENOUS

## 2018-01-21 MED ORDER — HYDROCODONE-ACETAMINOPHEN 7.5-325 MG PO TABS
1.0000 | ORAL_TABLET | ORAL | Status: DC | PRN
  Administered 2018-01-22: 1 via ORAL
  Administered 2018-01-23: 2 via ORAL
  Administered 2018-01-23: 1 via ORAL
  Filled 2018-01-21: qty 1
  Filled 2018-01-21: qty 2
  Filled 2018-01-21: qty 1

## 2018-01-21 MED ORDER — METOCLOPRAMIDE HCL 5 MG PO TABS: 5.0000 mg | ORAL_TABLET | Freq: Three times a day (TID) | ORAL | Status: DC | PRN

## 2018-01-21 MED ORDER — PAROXETINE HCL 20 MG PO TABS
40.0000 mg | ORAL_TABLET | Freq: Every day | ORAL | Status: DC
  Administered 2018-01-21 – 2018-01-24 (×4): 40 mg via ORAL
  Filled 2018-01-21 (×4): qty 2

## 2018-01-21 MED ORDER — METOPROLOL SUCCINATE ER 100 MG PO TB24
100.0000 mg | ORAL_TABLET | Freq: Every day | ORAL | Status: DC
  Administered 2018-01-21 – 2018-01-24 (×3): 100 mg via ORAL
  Filled 2018-01-21 (×4): qty 1

## 2018-01-21 MED ORDER — DOCUSATE SODIUM 100 MG PO CAPS
100.0000 mg | ORAL_CAPSULE | Freq: Two times a day (BID) | ORAL | Status: DC
  Administered 2018-01-21 – 2018-01-24 (×6): 100 mg via ORAL
  Filled 2018-01-21 (×6): qty 1

## 2018-01-21 MED ORDER — MENTHOL 3 MG MT LOZG
1.0000 | LOZENGE | OROMUCOSAL | Status: DC | PRN
Start: 2018-01-21 — End: 2018-01-24

## 2018-01-21 MED ORDER — PHENYLEPHRINE HCL 10 MG/ML IJ SOLN
INTRAVENOUS | Status: DC | PRN
  Administered 2018-01-21: 25 ug/min via INTRAVENOUS

## 2018-01-21 MED ORDER — RIVAROXABAN 10 MG PO TABS
10.0000 mg | ORAL_TABLET | Freq: Every day | ORAL | Status: DC
  Administered 2018-01-22: 10 mg via ORAL
  Filled 2018-01-21 (×2): qty 1

## 2018-01-21 MED ORDER — HYDROMORPHONE HCL 1 MG/ML IJ SOLN: 0.2500 mg | INTRAMUSCULAR | Status: DC | PRN

## 2018-01-21 MED ORDER — PROPOFOL 10 MG/ML IV BOLUS
INTRAVENOUS | Status: AC
  Filled 2018-01-21: qty 20

## 2018-01-21 MED ORDER — METOPROLOL SUCCINATE ER 25 MG PO TB24: 100.0000 mg | ORAL_TABLET | ORAL | Status: DC

## 2018-01-21 MED ORDER — RIVAROXABAN 10 MG PO TABS: 10.0000 mg | ORAL_TABLET | Freq: Every day | ORAL | 0 refills | Status: DC

## 2018-01-21 MED ORDER — DEXAMETHASONE SODIUM PHOSPHATE 10 MG/ML IJ SOLN
INTRAMUSCULAR | Status: AC
  Filled 2018-01-21: qty 3

## 2018-01-21 MED ORDER — POVIDONE-IODINE 10 % EX SWAB: 2.0000 "application " | Freq: Once | CUTANEOUS | Status: DC

## 2018-01-21 MED ORDER — AMLODIPINE BESYLATE 5 MG PO TABS
5.0000 mg | ORAL_TABLET | Freq: Every day | ORAL | Status: DC
  Administered 2018-01-21: 5 mg via ORAL
  Filled 2018-01-21 (×2): qty 1

## 2018-01-21 MED ORDER — METHOCARBAMOL 500 MG PO TABS
500.0000 mg | ORAL_TABLET | Freq: Three times a day (TID) | ORAL | Status: DC | PRN
  Administered 2018-01-22 – 2018-01-23 (×2): 500 mg via ORAL
  Filled 2018-01-21 (×2): qty 1

## 2018-01-21 MED ORDER — SODIUM CHLORIDE 0.9 % IR SOLN
Status: DC | PRN
  Administered 2018-01-21: 1000 mL

## 2018-01-21 MED ORDER — METOCLOPRAMIDE HCL 5 MG/ML IJ SOLN: 5.0000 mg | Freq: Three times a day (TID) | INTRAMUSCULAR | Status: DC | PRN

## 2018-01-21 MED ORDER — ONDANSETRON HCL 4 MG/2ML IJ SOLN
INTRAMUSCULAR | Status: AC
Start: 2018-01-21 — End: ?
  Filled 2018-01-21: qty 6

## 2018-01-21 MED ORDER — KETOROLAC TROMETHAMINE 15 MG/ML IJ SOLN
7.5000 mg | Freq: Four times a day (QID) | INTRAMUSCULAR | Status: DC
  Administered 2018-01-22 (×2): 7.5 mg via INTRAVENOUS
  Filled 2018-01-21 (×2): qty 1

## 2018-01-21 MED ORDER — ACETAMINOPHEN 325 MG PO TABS
325.0000 mg | ORAL_TABLET | Freq: Four times a day (QID) | ORAL | Status: DC | PRN
  Administered 2018-01-23: 325 mg via ORAL
  Filled 2018-01-21: qty 1

## 2018-01-21 MED ORDER — CEFAZOLIN SODIUM-DEXTROSE 2-4 GM/100ML-% IV SOLN
2.0000 g | Freq: Two times a day (BID) | INTRAVENOUS | Status: AC
  Administered 2018-01-22 (×2): 2 g via INTRAVENOUS
  Filled 2018-01-21 (×4): qty 100

## 2018-01-21 MED ORDER — PHENOL 1.4 % MT LIQD: 1.0000 | OROMUCOSAL | Status: DC | PRN

## 2018-01-21 MED ORDER — SENNA-DOCUSATE SODIUM 8.6-50 MG PO TABS: 2.0000 | ORAL_TABLET | Freq: Every day | ORAL | 1 refills | Status: DC

## 2018-01-21 MED ORDER — LIDOCAINE 2% (20 MG/ML) 5 ML SYRINGE
INTRAMUSCULAR | Status: AC
  Filled 2018-01-21: qty 10

## 2018-01-21 MED ORDER — ONDANSETRON HCL 4 MG/2ML IJ SOLN: 4.0000 mg | Freq: Once | INTRAMUSCULAR | Status: DC | PRN

## 2018-01-21 MED ORDER — ACETAMINOPHEN 500 MG PO TABS
500.0000 mg | ORAL_TABLET | Freq: Four times a day (QID) | ORAL | Status: AC
  Administered 2018-01-22 (×4): 500 mg via ORAL
  Filled 2018-01-21 (×4): qty 1

## 2018-01-21 MED ORDER — INSULIN ASPART 100 UNIT/ML ~~LOC~~ SOLN
0.0000 [IU] | Freq: Every day | SUBCUTANEOUS | Status: DC
  Administered 2018-01-21 – 2018-01-22 (×2): 2 [IU] via SUBCUTANEOUS
  Administered 2018-01-23: 3 [IU] via SUBCUTANEOUS

## 2018-01-21 MED ORDER — FENTANYL CITRATE (PF) 250 MCG/5ML IJ SOLN
INTRAMUSCULAR | Status: AC
  Filled 2018-01-21: qty 5

## 2018-01-21 MED ORDER — ATORVASTATIN CALCIUM 20 MG PO TABS
20.0000 mg | ORAL_TABLET | Freq: Every day | ORAL | Status: DC
  Administered 2018-01-21 – 2018-01-23 (×3): 20 mg via ORAL
  Filled 2018-01-21 (×3): qty 1

## 2018-01-21 MED ORDER — INSULIN ASPART 100 UNIT/ML ~~LOC~~ SOLN
0.0000 [IU] | Freq: Three times a day (TID) | SUBCUTANEOUS | Status: DC
  Administered 2018-01-22: 2 [IU] via SUBCUTANEOUS
  Administered 2018-01-23: 3 [IU] via SUBCUTANEOUS
  Administered 2018-01-23 (×2): 5 [IU] via SUBCUTANEOUS
  Administered 2018-01-24: 9 [IU] via SUBCUTANEOUS
  Administered 2018-01-24: 5 [IU] via SUBCUTANEOUS

## 2018-01-21 SURGICAL SUPPLY — 62 items
APL SKNCLS STERI-STRIP NONHPOA (GAUZE/BANDAGES/DRESSINGS) ×1
BENZOIN TINCTURE PRP APPL 2/3 (GAUZE/BANDAGES/DRESSINGS) ×3 IMPLANT
BLADE SAW SGTL 18.5X63.X.64 HD (BLADE) ×3 IMPLANT
BRUSH FEMORAL CANAL (MISCELLANEOUS) IMPLANT
CAPT HIP HEMI 1 ×2 IMPLANT
CLOSURE STERI-STRIP 1/2X4 (GAUZE/BANDAGES/DRESSINGS) ×1
CLOSURE WOUND 1/2 X4 (GAUZE/BANDAGES/DRESSINGS) ×1
CLSR STERI-STRIP ANTIMIC 1/2X4 (GAUZE/BANDAGES/DRESSINGS) ×2 IMPLANT
COVER BACK TABLE 24X17X13 BIG (DRAPES) IMPLANT
COVER SURGICAL LIGHT HANDLE (MISCELLANEOUS) ×3 IMPLANT
DECANTER SPIKE VIAL GLASS SM (MISCELLANEOUS) ×2 IMPLANT
DRAPE IMP U-DRAPE 54X76 (DRAPES) ×3 IMPLANT
DRAPE INCISE IOBAN 66X45 STRL (DRAPES) ×2 IMPLANT
DRAPE ORTHO SPLIT 77X108 STRL (DRAPES) ×6
DRAPE SURG ORHT 6 SPLT 77X108 (DRAPES) ×2 IMPLANT
DRAPE U-SHAPE 47X51 STRL (DRAPES) ×3 IMPLANT
DRAPE UNIVERSAL PACK (DRAPES) ×2 IMPLANT
DRILL BIT 5/64 (BIT) ×3 IMPLANT
DRSG MEPILEX BORDER 4X12 (GAUZE/BANDAGES/DRESSINGS) IMPLANT
DRSG MEPILEX BORDER 4X8 (GAUZE/BANDAGES/DRESSINGS) ×2 IMPLANT
DURAPREP 26ML APPLICATOR (WOUND CARE) ×3 IMPLANT
ELECT BLADE 6.5 EXT (BLADE) ×2 IMPLANT
ELECT CAUTERY BLADE 6.4 (BLADE) ×3 IMPLANT
ELECT REM PT RETURN 9FT ADLT (ELECTROSURGICAL) ×3
ELECTRODE REM PT RTRN 9FT ADLT (ELECTROSURGICAL) ×1 IMPLANT
FACESHIELD WRAPAROUND (MASK) ×6 IMPLANT
FACESHIELD WRAPAROUND OR TEAM (MASK) ×2 IMPLANT
GLOVE BIOGEL PI ORTHO PRO SZ8 (GLOVE) ×2
GLOVE ORTHO TXT STRL SZ7.5 (GLOVE) ×3 IMPLANT
GLOVE PI ORTHO PRO STRL SZ8 (GLOVE) ×1 IMPLANT
GLOVE SURG ORTHO 8.0 STRL STRW (GLOVE) ×6 IMPLANT
GOWN STRL REUS W/ TWL XL LVL3 (GOWN DISPOSABLE) ×1 IMPLANT
GOWN STRL REUS W/TWL 2XL LVL3 (GOWN DISPOSABLE) ×3 IMPLANT
GOWN STRL REUS W/TWL XL LVL3 (GOWN DISPOSABLE) ×3
HANDPIECE INTERPULSE COAX TIP (DISPOSABLE)
KIT BASIN OR (CUSTOM PROCEDURE TRAY) ×3 IMPLANT
KIT TURNOVER KIT B (KITS) ×3 IMPLANT
MANIFOLD NEPTUNE II (INSTRUMENTS) ×3 IMPLANT
NDL SUT 6 .5 CRC .975X.05 MAYO (NEEDLE) IMPLANT
NEEDLE 22X1 1/2 (OR ONLY) (NEEDLE) ×3 IMPLANT
NEEDLE MAYO TAPER (NEEDLE) ×3
NS IRRIG 1000ML POUR BTL (IV SOLUTION) ×3 IMPLANT
PACK TOTAL JOINT (CUSTOM PROCEDURE TRAY) ×3 IMPLANT
PACK UNIVERSAL I (CUSTOM PROCEDURE TRAY) ×3 IMPLANT
PAD ARMBOARD 7.5X6 YLW CONV (MISCELLANEOUS) ×6 IMPLANT
PILLOW ABDUCTION HIP (SOFTGOODS) ×3 IMPLANT
PRESSURIZER FEMORAL UNIV (MISCELLANEOUS) IMPLANT
RETRIEVER SUT HEWSON (MISCELLANEOUS) ×3 IMPLANT
SET HNDPC FAN SPRY TIP SCT (DISPOSABLE) IMPLANT
STRIP CLOSURE SKIN 1/2X4 (GAUZE/BANDAGES/DRESSINGS) ×1 IMPLANT
SUT FIBERWIRE #2 38 REV NDL BL (SUTURE) ×12
SUT VIC AB 0 CT1 27 (SUTURE) ×3
SUT VIC AB 0 CT1 27XBRD ANBCTR (SUTURE) ×1 IMPLANT
SUT VIC AB 1 CT1 27 (SUTURE) ×3
SUT VIC AB 1 CT1 27XBRD ANBCTR (SUTURE) ×2 IMPLANT
SUT VIC AB 3-0 SH 8-18 (SUTURE) ×3 IMPLANT
SUTURE FIBERWR#2 38 REV NDL BL (SUTURE) ×2 IMPLANT
SYR CONTROL 10ML LL (SYRINGE) ×3 IMPLANT
TOWEL OR 17X24 6PK STRL BLUE (TOWEL DISPOSABLE) ×3 IMPLANT
TOWEL OR 17X26 10 PK STRL BLUE (TOWEL DISPOSABLE) ×3 IMPLANT
TOWER CARTRIDGE SMART MIX (DISPOSABLE) IMPLANT
TRAY FOLEY MTR SLVR 16FR STAT (SET/KITS/TRAYS/PACK) ×1 IMPLANT

## 2018-01-21 NOTE — Anesthesia Preprocedure Evaluation (Signed)
Anesthesia Evaluation  Patient identified by MRN, date of birth, ID band Patient awake    Reviewed: Allergy & Precautions, NPO status , Patient's Chart, lab work & pertinent test results  Airway Mallampati: I  TM Distance: >3 FB Neck ROM: Full    Dental   Pulmonary    Pulmonary exam normal        Cardiovascular hypertension, Pt. on medications + CAD and + Cardiac Stents  Normal cardiovascular exam     Neuro/Psych Anxiety    GI/Hepatic GERD  Medicated and Controlled,  Endo/Other  diabetes, Type 2, Oral Hypoglycemic Agents  Renal/GU      Musculoskeletal   Abdominal   Peds  Hematology   Anesthesia Other Findings   Reproductive/Obstetrics                             Anesthesia Physical Anesthesia Plan  ASA: III  Anesthesia Plan: General   Post-op Pain Management:    Induction: Intravenous  PONV Risk Score and Plan: 3 and Ondansetron, Midazolam and Treatment may vary due to age or medical condition  Airway Management Planned: Oral ETT  Additional Equipment:   Intra-op Plan:   Post-operative Plan: Extubation in OR  Informed Consent: I have reviewed the patients History and Physical, chart, labs and discussed the procedure including the risks, benefits and alternatives for the proposed anesthesia with the patient or authorized representative who has indicated his/her understanding and acceptance.     Plan Discussed with: CRNA and Surgeon  Anesthesia Plan Comments:         Anesthesia Quick Evaluation

## 2018-01-21 NOTE — ED Triage Notes (Signed)
Pt brought in by EMS due having a fall today. Pt c/o left hip pain with some deformity. Pt received 164mcg of fentanyl and 4mg  of zofran. Pt did hit head on concrete. Pt a&ox4. Pt denies LOC, back/neck pain, or dizziness.

## 2018-01-21 NOTE — Anesthesia Postprocedure Evaluation (Signed)
Anesthesia Post Note  Patient: Stephanie [name]  Procedure(s) Performed: ARTHROPLASTY BIPOLAR HIP (HEMIARTHROPLASTY) (Left Hip)     Patient location during evaluation: PACU Anesthesia Type: General Level of consciousness: sedated Pain management: pain level controlled Vital Signs Assessment: post-procedure vital signs reviewed and stable Respiratory status: spontaneous breathing and respiratory function stable Cardiovascular status: stable Postop Assessment: no apparent nausea or vomiting Anesthetic complications: no    Last Vitals:  Vitals:   01/21/18 2045 01/21/18 2100  BP: (!) 171/79 (!) 181/59  Pulse: 80 68  Resp: 15   Temp: 36.7 C (!) 36.4 C  SpO2: 99% 100%                  Braidon Chermak DANIEL

## 2018-01-21 NOTE — Progress Notes (Signed)
The patient has been re-examined, and the chart reviewed, and there have been no interval changes to the documented history and physical.    The risks benefits and alternatives were discussed with the patient including but not limited to the risks of nonoperative treatment, versus surgical intervention including infection, bleeding, nerve injury, periprosthetic fracture, the need for revision surgery, dislocation, leg length discrepancy, blood clots, cardiopulmonary complications, morbidity, mortality, among others, and they were willing to proceed.    Shyenne Maggard P Cristella Stiver, MD  

## 2018-01-21 NOTE — H&P (Addendum)
History and Physical    MADDELYNN [name] SUP:[id number] DOB: 01-07-1934 DOA: 01/21/2018  Referring MD/NP/PA:   PCP: Abner Greenspan, MD   Patient coming from:  The patient is coming from home.  At baseline, pt is independent for most of ADL.   Chief Complaint: fall, left pain pain  HPI: Stephanie [name] is a 84 y.o. female with medical history significant of hypertension, hyperlipidemia, diabetes mellitus, TIA, GERD, depression, PVD, CAD, stent placement, anemia, CKD-2, who presents with fall, left hip pain.  Patient states that she fell while she was walking dog and tripped her steps, injured her left hip. Pt did hit head on concrete. Denies any LOC.  She complains of her left hip pain, which is constant, severe, nonradiating no leg numbness or weakness. Patient does not have chest pain, shortness of breath, cough, fever or chills.  No nausea, vomiting, diarrhea, abdominal pain, symptoms of UTI. Pt was taken to OR by ortho, Dr. Mardelle Matte. She is now s/p of surgery. She has mild pain in surgical site.  ED Course: pt was found to have WBC 9.6, renal function close to baseline, temperature normal, currently no tachycardia, oxygen saturation 91 to 200% on room air, negative chest x-ray.  CT head is negative for acute intracranial abnormalities.  CT of C-spine showed degenerative disc disease. X-ray of left hip showed acute subcapital fracture of the left hip. Pt is admitted to the MedSurg bed as inpatient.   Review of Systems:   General: no fevers, chills, no body weight gain, has fatigue HEENT: no blurry vision, hearing changes or sore throat Respiratory: no dyspnea, coughing, wheezing CV: no chest pain, no palpitations GI: no nausea, vomiting, abdominal pain, diarrhea, constipation GU: no dysuria, burning on urination, increased urinary frequency, hematuria  Ext: no leg edema Neuro: no unilateral weakness, numbness, or tingling, no vision change or hearing loss. Had fall. Skin: no rash, no skin  tear. MSK: s/p of left hip surgery with pain Heme: No easy bruising.  Travel history: No recent long distant travel.  Allergy:  Allergies  Allergen Reactions  . Ace Inhibitors Other (See Comments)    REACTION: generic, reaction not known  . Amlodipine Besy-Benazepril Hcl Other (See Comments)    REACTION: chest pain  . Clopidogrel Bisulfate Other (See Comments)    REACTION: GI  . Erythromycin Other (See Comments)    REACTION: GI upset    Past Medical History:  Diagnosis Date  . Allergy    allergic rhinitis  . Anxiety   . Arthritis    osteoarthritis  . Degenerative disk disease    in neck  . Diabetes mellitus    type II  . Fracture of femoral neck, left ([place]) 01/21/2018  . Hyperlipidemia   . Hypertension   . Hypoaldosteronism ([place]) 6/12   from DM causing hyperkalemia   . Osteopenia   . Periodontal disease   . Peripheral vascular disease ([place])   . Transient cerebral ischemia 11/06/2016    Past Surgical History:  Procedure Laterality Date  . CARDIAC CATHETERIZATION     x2 STENT  . CHOLECYSTECTOMY    . ESOPHAGOGASTRODUODENOSCOPY  03/2004   gastritis by biopsy  . GUM SURGERY  06/2010   laser surgery for gums  . MM BREAST STEREO BX*L*R/S  12/12   normal  . TONSILLECTOMY    . TRANSTHORACIC ECHOCARDIOGRAM  11/2016   EF 55-60%, grade 1 DD with elevated LV end diastolic filling pressures, mildly dilated LA, mild TR and PR.   Marland Kitchen  TUBAL LIGATION      Social History:  reports that she has never smoked. She has never used smokeless tobacco. She reports that she does not drink alcohol or use drugs.  Family History:  Family History  Problem Relation Age of Onset  . Cancer Mother        vaginal CA in situ  . Hypertension Mother   . Heart disease Mother        CAD and MI  . Heart disease Father        CAD  . Diabetes Father   . Heart disease Son        congenital valve problem     Prior to Admission medications   Medication Sig Start Date End Date Taking?  Authorizing Provider  amLODipine (NORVASC) 5 MG tablet Take 1 tablet (5 mg total) by mouth daily. 02/11/17  Yes Tower, Wynelle Fanny, MD  atorvastatin (LIPITOR) 20 MG tablet Take 1 tablet (20 mg total) by mouth daily. 02/12/17  Yes Tower, Wynelle Fanny, MD  chlorthalidone (HYGROTON) 25 MG tablet Take 1 tablet (25 mg total) by mouth daily. 08/27/17  Yes Tower, Marne A, MD  diphenhydramine-acetaminophen (TYLENOL PM EXTRA STRENGTH) 25-500 MG TABS Take 1 tablet by mouth at bedtime as needed.     Yes [provider]  dipyridamole-aspirin (AGGRENOX) 200-25 MG 12hr capsule Take 1 capsule by mouth 2 (two) times daily. 08/27/17  Yes Tower, Marne A, MD  GLIPIZIDE XL 10 MG 24 hr tablet TAKE 1 TABLET BY MOUTH ONCE DAILY WITH BREAKFAST 08/19/17  Yes Tower, Wynelle Fanny, MD  metFORMIN (GLUCOPHAGE) 1000 MG tablet Take 1 tablet (1,000 mg total) by mouth 2 (two) times daily with a meal. 08/27/17  Yes Tower, Marne A, MD  metoprolol succinate (TOPROL-XL) 100 MG 24 hr tablet TAKE 1 TABLET BY MOUTH ONCE DAILY TAKE  WITH  OR  IMMEDIATELY  FOLLOWING  A  MEAL 08/27/17  Yes Tower, Marne A, MD  PARoxetine (PAXIL) 40 MG tablet Take 1 tablet (40 mg total) by mouth daily. 08/27/17  Yes Tower, Wynelle Fanny, MD  ranitidine (ZANTAC) 150 MG tablet Take 150 mg by mouth 2 (two) times daily.   Yes [provider]  acetaminophen (TYLENOL) 325 MG tablet Take 2 tablets (650 mg total) by mouth every 6 (six) hours as needed. 01/21/18   Marchia Bond, MD  amoxicillin-clavulanate (AUGMENTIN) 875-125 MG tablet Take 1 tablet by mouth 2 (two) times daily. Patient not taking: Reported on 01/21/2018 11/11/17   Pleas Koch, NP  glucose blood (ACCU-CHEK SMARTVIEW) test strip USE ONE STRIP TO TEST BLOOD SUGAR TWICE  DAILY FOR POORLY CONTROLLED dm TYPE 2 02/11/17   Tower, Roque Lias A, MD  glucose blood (FREESTYLE TEST STRIPS) test strip Use as instructed 02/17/17   Tower, Wynelle Fanny, MD  guaiFENesin-codeine (ROBITUSSIN AC) 100-10 MG/5ML syrup Take 5 mLs by mouth at  bedtime as needed for cough. Patient not taking: Reported on 01/21/2018 11/11/17   Pleas Koch, NP  rivaroxaban (XARELTO) 10 MG TABS tablet Take 1 tablet (10 mg total) by mouth daily. 01/21/18   Marchia Bond, MD  sennosides-docusate sodium (SENOKOT-S) 8.6-50 MG tablet Take 2 tablets by mouth daily. 01/21/18   Marchia Bond, MD    Physical Exam: Vitals:   01/21/18 2045 01/21/18 2100 01/22/18 0007 01/22/18 0047  BP: (!) 171/79 (!) 181/59 (!) 114/50 (!) 107/50  Pulse: 80 68 62 (!) 57  Resp: 15  18 18   Temp: 98 F (36.7 C) (!)  97.5 F (36.4 C) 97.8 F (36.6 C) (!) 97.4 F (36.3 C)  TempSrc:  Oral Oral Oral  SpO2: 99% 100% 100% 100%  Weight:      Height:       General: Not in acute distress HEENT:       Eyes: PERRL, EOMI, no scleral icterus.       ENT: No discharge from the ears and nose, no pharynx injection, no tonsillar enlargement.        Neck: No JVD, no bruit, no mass felt. Heme: No neck lymph node enlargement. Cardiac: S1/S2, RRR, No murmurs, No gallops or rubs. Respiratory:  No rales, wheezing, rhonchi or rubs. GI: Soft, nondistended, nontender, no rebound pain, no organomegaly, BS present. GU: No hematuria Ext: No pitting leg edema bilaterally. 2+DP/PT pulse bilaterally. Musculoskeletal: s/p of surgery of left hip with tenderness Skin: No rashes.  Neuro: Alert, oriented X3, cranial nerves II-XII grossly intact, moves all extremities normally.  Psych: Patient is not psychotic, no suicidal or hemocidal ideation.  Labs on Admission: I have personally reviewed following labs and imaging studies  CBC: Recent Labs  Lab 01/21/18 1323  WBC 9.6  NEUTROABS 6.1  HGB 10.8*  HCT 32.8*  MCV 94.3  PLT 094   Basic Metabolic Panel: Recent Labs  Lab 01/21/18 1323  NA 138  K 4.7  CL 106  CO2 23  GLUCOSE 164*  BUN 18  CREATININE 1.02*  CALCIUM 9.7   GFR: Estimated Creatinine Clearance: 32.6 mL/min (A) (by C-G formula based on SCr of 1.02 mg/dL (H)). Liver  Function Tests: No results for input(s): AST, ALT, ALKPHOS, BILITOT, PROT, ALBUMIN in the last 168 hours. No results for input(s): LIPASE, AMYLASE in the last 168 hours. No results for input(s): AMMONIA in the last 168 hours. Coagulation Profile: Recent Labs  Lab 01/21/18 1323  INR 1.09   Cardiac Enzymes: No results for input(s): CKTOTAL, CKMB, CKMBINDEX, TROPONINI in the last 168 hours. BNP (last 3 results) No results for input(s): PROBNP in the last 8760 hours. HbA1C: No results for input(s): HGBA1C in the last 72 hours. CBG: Recent Labs  Lab 01/21/18 1656 01/21/18 2009 01/21/18 2230  GLUCAP 153* 177* 223*   Lipid Profile: No results for input(s): CHOL, HDL, LDLCALC, TRIG, CHOLHDL, LDLDIRECT in the last 72 hours. Thyroid Function Tests: No results for input(s): TSH, T4TOTAL, FREET4, T3FREE, THYROIDAB in the last 72 hours. Anemia Panel: No results for input(s): VITAMINB12, FOLATE, FERRITIN, TIBC, IRON, RETICCTPCT in the last 72 hours. Urine analysis: No results found for: COLORURINE, APPEARANCEUR, LABSPEC, PHURINE, GLUCOSEU, HGBUR, BILIRUBINUR, KETONESUR, PROTEINUR, UROBILINOGEN, NITRITE, LEUKOCYTESUR Sepsis Labs: @LABRCNTIP (procalcitonin:4,lacticidven:4) )No results found for this or any previous visit (from the past 240 hour(s)).   Radiological Exams on Admission: Dg Chest 1 View  Result Date: 01/21/2018 CLINICAL DATA:  Patient fell today and is complaining of left hip pain. Nonsmoker. History of coronary artery disease and diabetes. No current chest complaints. EXAM: CHEST  1 VIEW COMPARISON:  PA and lateral chest x-ray of November 11, 2017. FINDINGS: The lungs are adequately inflated and clear. The heart and pulmonary vascularity are normal. The mediastinum is normal in width. There is calcification in the wall of the aortic arch. There is faintly calcified nodularity within the left breast. There is no pleural effusion. The bony thorax exhibits no acute abnormality.  IMPRESSION: There is no acute cardiopulmonary abnormality. Thoracic aortic atherosclerosis. Electronically Signed   By: David  Martinique M.D.   On: 01/21/2018 13:52   Ct  Head Wo Contrast  Result Date: 01/21/2018 CLINICAL DATA:  [place] today striking head on concrete, high clinical risk of cervical spine trauma, history hypertension, type II diabetes mellitus, coronary artery disease EXAM: CT HEAD WITHOUT CONTRAST CT CERVICAL SPINE WITHOUT CONTRAST TECHNIQUE: Multidetector CT imaging of the head and cervical spine was performed following the standard protocol without intravenous contrast. Multiplanar CT image reconstructions of the cervical spine were also generated. COMPARISON:  CT head 03/28/2017 FINDINGS: CT HEAD FINDINGS Brain: Generalized atrophy. Normal ventricular morphology. No midline shift or mass effect. Small vessel chronic ischemic changes of deep cerebral white matter. No intracranial hemorrhage, mass lesion, evidence of acute infarction, or extra-axial fluid collection. Vascular: Atherosclerotic calcification of internal carotid arteries at skull base Skull: Intact Sinuses/Orbits: Visualized paranasal sinuses and mastoid air cells clear Other: N/A CT CERVICAL SPINE FINDINGS Alignment: Normal Skull base and vertebrae: Visualized skull base intact. Osseous mineralization normal. T by heights maintained without fracture, subluxation, or bone destruction. Scattered facet degenerative changes. Soft tissues and spinal canal: Prevertebral soft tissues normal thickness. 9 mm nonspecific RIGHT thyroid nodule. Cervical soft tissues otherwise unremarkable. Disc levels: Multilevel disc space narrowing and endplate spur formation greatest at C5-C6 and C6-C7. Uncovertebral spurs encroach upon cervical neural foramina bilaterally at C5-C6 and C6-C7. Bulging discs noted at C5-C6 and C6-C7. Upper chest: Minimal biapical scarring. Other: Atherosclerotic calcifications at the carotid bifurcations bilaterally. IMPRESSION:  Atrophy with small vessel chronic ischemic changes of deep cerebral white matter. No acute intracranial abnormalities. Degenerative disc and facet disease changes of the cervical spine. No acute cervical spine abnormalities. Electronically Signed   By: Lavonia Dana M.D.   On: 01/21/2018 14:16   Ct Cervical Spine Wo Contrast  Result Date: 01/21/2018 CLINICAL DATA:  [place] today striking head on concrete, high clinical risk of cervical spine trauma, history hypertension, type II diabetes mellitus, coronary artery disease EXAM: CT HEAD WITHOUT CONTRAST CT CERVICAL SPINE WITHOUT CONTRAST TECHNIQUE: Multidetector CT imaging of the head and cervical spine was performed following the standard protocol without intravenous contrast. Multiplanar CT image reconstructions of the cervical spine were also generated. COMPARISON:  CT head 03/28/2017 FINDINGS: CT HEAD FINDINGS Brain: Generalized atrophy. Normal ventricular morphology. No midline shift or mass effect. Small vessel chronic ischemic changes of deep cerebral white matter. No intracranial hemorrhage, mass lesion, evidence of acute infarction, or extra-axial fluid collection. Vascular: Atherosclerotic calcification of internal carotid arteries at skull base Skull: Intact Sinuses/Orbits: Visualized paranasal sinuses and mastoid air cells clear Other: N/A CT CERVICAL SPINE FINDINGS Alignment: Normal Skull base and vertebrae: Visualized skull base intact. Osseous mineralization normal. T by heights maintained without fracture, subluxation, or bone destruction. Scattered facet degenerative changes. Soft tissues and spinal canal: Prevertebral soft tissues normal thickness. 9 mm nonspecific RIGHT thyroid nodule. Cervical soft tissues otherwise unremarkable. Disc levels: Multilevel disc space narrowing and endplate spur formation greatest at C5-C6 and C6-C7. Uncovertebral spurs encroach upon cervical neural foramina bilaterally at C5-C6 and C6-C7. Bulging discs noted at C5-C6  and C6-C7. Upper chest: Minimal biapical scarring. Other: Atherosclerotic calcifications at the carotid bifurcations bilaterally. IMPRESSION: Atrophy with small vessel chronic ischemic changes of deep cerebral white matter. No acute intracranial abnormalities. Degenerative disc and facet disease changes of the cervical spine. No acute cervical spine abnormalities. Electronically Signed   By: Lavonia Dana M.D.   On: 01/21/2018 14:16   Dg Hip Port Unilat With Pelvis 1v Left  Result Date: 01/21/2018 CLINICAL DATA:  Hemiarthroplasty EXAM: DG HIP (WITH OR WITHOUT PELVIS) 1V  PORT LEFT COMPARISON:  Earlier today FINDINGS: Left hip hemiarthroplasty is located. Negative for periprosthetic fracture. IMPRESSION: No acute finding after left hip arthroplasty. Electronically Signed   By: Monte Fantasia M.D.   On: 01/21/2018 20:48   Dg Hip Unilat With Pelvis 2-3 Views Left  Result Date: 01/21/2018 CLINICAL DATA:  The patient fell today and is complaining of left hip pain. EXAM: DG HIP (WITH OR WITHOUT PELVIS) 2-3V LEFT COMPARISON:  No recent studies in [facility] FINDINGS: The patient has sustained an acute subcapital fracture of the left hip. There is mild superior migration of the proximal femur with respect to the mildly rotated femoral head. The left acetabulum appears intact. No acute abnormality of the bony pelvis is observed. IMPRESSION: Acute subcapital fracture of the left hip. Electronically Signed   By: David  Martinique M.D.   On: 01/21/2018 13:54     EKG: Independently reviewed.  Sinus rhythm, QTC 472, anteroseptal infarction pattern  Assessment/Plan Principal Problem:   Subcapital fracture of left hip (HCC) Active Problems:   Type 2 diabetes, uncontrolled, with retinopathy ([place])   Essential hypertension   Transient cerebral ischemia   Fall   Depression   HLD (hyperlipidemia)   GERD (gastroesophageal reflux disease)   CAD (coronary artery disease)   Chronic kidney disease (CKD), stage II  (mild)   Subcapital fracture of left hip [facility]): s/p of surgery. No neurovascular compromise.  - will admit to Med-surg bed - Pain control: morphine prn and Norco - When necessary Zofran for nausea - Robaxin for muscle spasm - PT/OT - consult SW and CM for possible placement - Xarelto was ordered by ortho for DVT PPX, starting tomorrow  Fall: seems to a mechanical fall.  CT head is negative.  CT of C-spine only showed degenerative disc disease. -PT/OT  Chronic kidney disease (CKD), stage II: cre is 1.02, close to baseline -f/u by BMP -IVF: 75 cc/h of NS  Addendum: Cre is up to 1.32 and K=5.2 in early morning, likely due to dehydration. -will d/c  Chlorthalidone -increase IVF to 125 cc/h of NS -Kayexalate 15 g -CK -Repeat to BMP at 11:00 AM   Transient cerebral ischemia: pt was on Aggrenox. -We will hold Aggrenox since Xarelto was ordered by Ortho -Continue Lipitor  HLD: -lipitor  GERD: -Pepcid   Type 2 diabetes, uncontrolled, with retinopathy ([place]): Last A1c 6.3 on 08/27/17, well controled. Patient is taking metformin and glipizide at home -SSI  HTN:  -Continue home medications: Amlodipine, chlorthalidone, metoprolol -IV hydralazine prn  CAD: s/p of stent. No CP. -Continue Lipitor, metoprolol  Depression: -Paxil   DVT ppx: SCD and then Xarelto tomorrow Code Status: Full code Family Communication: None at bed side.       Disposition Plan:  To be determined Consults called:  Ortho, dr. Mardelle Matte Admission status:  medical floor/inpt       Date of Service 01/22/2018    Ivor Costa [facility] Pager [phone]  If 7PM-7AM, please contact night-coverage [email or web address] Password [facility] 01/22/2018, 2:31 AM

## 2018-01-21 NOTE — Transfer of Care (Signed)
Immediate Anesthesia Transfer of Care Note  Patient: Stephanie [name]  Procedure(s) Performed: ARTHROPLASTY BIPOLAR HIP (HEMIARTHROPLASTY) (Left Hip)  Patient Location: PACU  Anesthesia Type:General  Level of Consciousness: drowsy, patient cooperative and responds to stimulation  Airway & Oxygen Therapy: Patient Spontanous Breathing and Patient connected to nasal cannula oxygen  Post-op Assessment: Report given to RN and Post -op Vital signs reviewed and stable  Post vital signs: Reviewed and stable  Last Vitals:  Vitals Value Taken Time  BP    Temp    Pulse 77 01/21/2018  8:09 PM  Resp 15 01/21/2018  8:09 PM  SpO2 91 % 01/21/2018  8:09 PM  Vitals shown include unvalidated device data.  Last Pain:  Vitals:   01/21/18 1542  TempSrc:   PainSc: 9          Complications: No apparent anesthesia complications

## 2018-01-21 NOTE — Op Note (Signed)
01/21/2018  7:32 PM  PATIENT:  Stephanie [name]   MRN: [id number]  PRE-OPERATIVE DIAGNOSIS: Displaced left femoral neck fracture  POST-OPERATIVE DIAGNOSIS: Same  PROCEDURE:  Procedure(s): Left hip hemiarthroplasty  PREOPERATIVE INDICATIONS:  Stephanie [name] is an 84 y.o. female who was admitted 01/21/2018 with a diagnosis of Fracture of femoral neck, left ([employer]) and elected for surgical management.  The risks benefits and alternatives were discussed with the patient including but not limited to the risks of nonoperative treatment, versus surgical intervention including infection, bleeding, nerve injury, periprosthetic fracture, the need for revision surgery, dislocation, leg length discrepancy, blood clots, cardiopulmonary complications, morbidity, mortality, among others, and they were willing to proceed.  Predicted outcome is good, although there will be at least a six to nine month expected recovery.   OPERATIVE REPORT     SURGEON:  Marchia Bond, MD    ASSISTANT:  Joya Gaskins, OPA-C  (Present throughout the entire procedure,  necessary for completion of procedure in a timely manner, assisting with retraction, instrumentation, and closure)     ANESTHESIA: General   ESTIMATED BLOOD LOSS: 572 mL    COMPLICATIONS:  None.   UNIQUE ASPECTS OF THE CASE: The femoral neck fracture was fairly vertical, bone quality mediocre to poor, the acetabulum had a little bit of wear, but not enough to warrant total hip replacement in this age group.  The hip was initially fairly tight, I actually had to countersink a size to broach and then calcar plane in order to get appropriate length to allow reduction.     COMPONENTS:  Chemical engineer Femoral Fracture stem size 3, with a -3 spacer and a size 44 fracture head unipolar hip ball.    PROCEDURE IN DETAIL: The patient was met in the holding area and identified.  The appropriate hip  was marked at the operative site. The patient was then  transported to the OR and  placed under anesthesia.  At that point, the patient was  placed in the lateral decubitus position with the operative side up and  secured to the operating room table and all bony prominences padded.     The operative lower extremity was prepped from the iliac crest to the toes.  Sterile draping was performed.  Time out was performed prior to incision.      A routine posterolateral approach was utilized via sharp dissection  carried down to the subcutaneous tissue.  Gross bleeders were Bovie  coagulated.  The iliotibial band was identified and incised  along the length of the skin incision.  Self-retaining retractors were  inserted.  With the hip internally rotated, the short external rotators  were identified. The piriformis was tagged with FiberWire, and the hip capsule released in a T-type fashion.  The femoral neck was exposed, and I resected the femoral neck using the appropriate jig. This was performed at approximately a thumb's breadth above the lesser trochanter.    I then exposed the deep acetabulum, cleared out any tissue including the ligamentum teres, and included the hip capsule in the FiberWire used above and below the T.    I then prepared the proximal femur using the cookie-cutter, the lateralizing reamer, and then sequentially broached.  A trial utilized, and I reduced the hip and it was found to have excellent stability with functional range of motion. The trial components were then removed.   I then place the real implant and I impacted the real head ball into place. The  hip was then reduced and taken through functional range of motion and found to have excellent stability. Leg lengths were restored.  I then used a 2 mm drill bits to pass the FiberWire suture from the capsule and piriformis through the greater trochanter, and secured this. Excellent posterior capsular repair was achieved. I also closed the T in the capsule.  I then irrigated the hip  copiously again with pulse lavage, and repaired the fascia with Vicryl, followed by Vicryl for the subcutaneous tissue, Monocryl for the skin, Steri-Strips and sterile gauze. The wounds were injected. The patient was then awakened and returned to PACU in stable and satisfactory condition. There were no complications.  Marchia Bond, MD Orthopedic Surgeon [phone]   01/21/2018 7:32 PM

## 2018-01-21 NOTE — ED Provider Notes (Signed)
[facility] Provider Note   CSN: [id number] Arrival date & time: 01/21/18  1220     History   Chief Complaint Chief Complaint  Patient presents with  . Fall  . Hip Pain    HPI REGENIA ERCK is a 84 y.o. female presenting for evaluation of left hip pain after mechanical fall.  Patient states that she tripped while walking the dog this morning.  She fell on her left side, and hit the back of her head against the concrete.  She denies loss of consciousness.  She takes Aggrenox, no blood thinner.  She reports acute onset left hip pain at that time.  She has not been able to ambulate since.  She denies injury or pain elsewhere.  She denies headache, vision changes, slurred speech, neck pain, back pain, chest pain, shortness of breath, nausea, vomiting, abdominal pain, loss of bowel bladder control, numbness, or tingling.  Past medical history significant for diabetes and hypertension.  She has not taken her medications today.  She is not on insulin.  She has had nothing to eat or drink today.  HPI  Past Medical History:  Diagnosis Date  . Allergy    allergic rhinitis  . Anxiety   . Arthritis    osteoarthritis  . Degenerative disk disease    in neck  . Diabetes mellitus    type II  . Hyperlipidemia   . Hypertension   . Hypoaldosteronism ([place]) 6/12   from DM causing hyperkalemia   . Osteopenia   . Periodontal disease   . Peripheral vascular disease ([place])   . Transient cerebral ischemia 11/06/2016    Patient Active Problem List   Diagnosis Date Noted  . Closed left hip fracture ([place]) 01/21/2018  . Head injury, closed, initial encounter 03/28/2017  . Rash and nonspecific skin eruption 03/28/2017  . Post-traumatic headache 03/28/2017  . Carotid stenosis, bilateral 11/20/2016  . Transient cerebral ischemia 11/06/2016  . Neck pain 11/10/2015  . Vitamin D deficiency 09/04/2015  . Routine general medical examination at a health care  facility 09/04/2015  . Encounter for Medicare annual wellness exam 05/30/2014  . Colon cancer screening 05/30/2014  . History of fall 05/26/2013  . Gynecological examination 02/20/2011  . HYPERKALEMIA 03/06/2010  . HELICOBACTER PYLORI INFECTION 12/19/2006  . HSV 12/19/2006  . Type 2 diabetes, uncontrolled, with retinopathy ([place]) 12/19/2006  . Diabetic retinopathy ([place]) 12/19/2006  . Combined hyperlipidemia associated with type 2 diabetes mellitus ([place]) 12/19/2006  . ANXIETY 12/19/2006  . Essential hypertension 12/19/2006  . Coronary atherosclerosis 12/19/2006  . Peripheral vascular disease due to secondary diabetes mellitus ([place]) 12/19/2006  . ALLERGIC RHINITIS 12/19/2006  . OSTEOARTHRITIS 12/19/2006  . Osteopenia 12/19/2006    Past Surgical History:  Procedure Laterality Date  . CARDIAC CATHETERIZATION     x2 STENT  . CHOLECYSTECTOMY    . ESOPHAGOGASTRODUODENOSCOPY  03/2004   gastritis by biopsy  . GUM SURGERY  06/2010   laser surgery for gums  . MM BREAST STEREO BX*L*R/S  12/12   normal  . TONSILLECTOMY    . TRANSTHORACIC ECHOCARDIOGRAM  11/2016   EF 55-60%, grade 1 DD with elevated LV end diastolic filling pressures, mildly dilated LA, mild TR and PR.   . TUBAL LIGATION       OB History   None      Home Medications    Prior to Admission medications   Medication Sig Start Date End Date Taking? Authorizing Provider  amLODipine (  NORVASC) 5 MG tablet Take 1 tablet (5 mg total) by mouth daily. 02/11/17  Yes Tower, Wynelle Fanny, MD  atorvastatin (LIPITOR) 20 MG tablet Take 1 tablet (20 mg total) by mouth daily. 02/12/17  Yes Tower, Wynelle Fanny, MD  chlorthalidone (HYGROTON) 25 MG tablet Take 1 tablet (25 mg total) by mouth daily. 08/27/17  Yes Tower, Marne A, MD  diphenhydramine-acetaminophen (TYLENOL PM EXTRA STRENGTH) 25-500 MG TABS Take 1 tablet by mouth at bedtime as needed.     Yes [provider]  dipyridamole-aspirin (AGGRENOX) 200-25 MG 12hr capsule Take 1 capsule  by mouth 2 (two) times daily. 08/27/17  Yes Tower, Marne A, MD  GLIPIZIDE XL 10 MG 24 hr tablet TAKE 1 TABLET BY MOUTH ONCE DAILY WITH BREAKFAST 08/19/17  Yes Tower, Wynelle Fanny, MD  metFORMIN (GLUCOPHAGE) 1000 MG tablet Take 1 tablet (1,000 mg total) by mouth 2 (two) times daily with a meal. 08/27/17  Yes Tower, Marne A, MD  metoprolol succinate (TOPROL-XL) 100 MG 24 hr tablet TAKE 1 TABLET BY MOUTH ONCE DAILY TAKE  WITH  OR  IMMEDIATELY  FOLLOWING  A  MEAL 08/27/17  Yes Tower, Marne A, MD  PARoxetine (PAXIL) 40 MG tablet Take 1 tablet (40 mg total) by mouth daily. 08/27/17  Yes Tower, Wynelle Fanny, MD  ranitidine (ZANTAC) 150 MG tablet Take 150 mg by mouth 2 (two) times daily.   Yes [provider]  amoxicillin-clavulanate (AUGMENTIN) 875-125 MG tablet Take 1 tablet by mouth 2 (two) times daily. Patient not taking: Reported on 01/21/2018 11/11/17   Pleas Koch, NP  glucose blood (ACCU-CHEK SMARTVIEW) test strip USE ONE STRIP TO TEST BLOOD SUGAR TWICE  DAILY FOR POORLY CONTROLLED dm TYPE 2 02/11/17   Tower, Roque Lias A, MD  glucose blood (FREESTYLE TEST STRIPS) test strip Use as instructed 02/17/17   Tower, Wynelle Fanny, MD  guaiFENesin-codeine (ROBITUSSIN AC) 100-10 MG/5ML syrup Take 5 mLs by mouth at bedtime as needed for cough. Patient not taking: Reported on 01/21/2018 11/11/17   Pleas Koch, NP    Family History Family History  Problem Relation Age of Onset  . Cancer Mother        vaginal CA in situ  . Hypertension Mother   . Heart disease Mother        CAD and MI  . Heart disease Father        CAD  . Diabetes Father   . Heart disease Son        congenital valve problem    Social History Social History   Tobacco Use  . Smoking status: Never Smoker  . Smokeless tobacco: Never Used  Substance Use Topics  . Alcohol use: No    Alcohol/week: 0.0 oz  . Drug use: No     Allergies   Ace inhibitors; Amlodipine besy-benazepril hcl; Clopidogrel bisulfate; and Erythromycin   Review of  Systems Review of Systems  Musculoskeletal: Positive for arthralgias.  Neurological: Negative for numbness.  All other systems reviewed and are negative.    Physical Exam Updated Vital Signs BP (!) 167/67   Pulse 64   Temp 98.5 F (36.9 C) (Oral)   Resp 12   SpO2 97%   Physical Exam  Constitutional: She is oriented to person, place, and time. She appears well-developed and well-nourished. No distress.  Elderly female in no acute distress  HENT:  Head: Normocephalic and atraumatic.  Eyes: Pupils are equal, round, and reactive to light. Conjunctivae and EOM are normal.  Neck: Normal range of motion. Neck supple.  Cardiovascular: Normal rate, regular rhythm and intact distal pulses.  Pulmonary/Chest: Effort normal and breath sounds normal. No respiratory distress. She has no wheezes.  No tenderness to palpation of the chest wall.  Abdominal: Soft. She exhibits no distension and no mass. There is no tenderness. There is no guarding.  No tenderness palpation of the abdomen.  Musculoskeletal: Normal range of motion. She exhibits tenderness and deformity.  Tenderness palpation of left lateral hip.  Left leg is shortened and externally rotated.  Pedal pulses intact bilaterally.  Good cap refill.  No tenderness palpation elsewhere in the leg, including the knee and ankle.  Neurological: She is alert and oriented to person, place, and time. No sensory deficit.  Skin: Skin is warm and dry. Capillary refill takes less than 2 seconds.  Psychiatric: She has a normal mood and affect.  Nursing note and vitals reviewed.    ED Treatments / Results  Labs (all labs ordered are listed, but only abnormal results are displayed) Labs Reviewed  BASIC METABOLIC PANEL - Abnormal; Notable for the following components:      Result Value   Glucose, Bld 164 (*)    Creatinine, Ser 1.02 (*)    GFR calc non Af Amer 50 (*)    GFR calc Af Amer 58 (*)    All other components within normal limits  CBC  WITH DIFFERENTIAL/PLATELET - Abnormal; Notable for the following components:   RBC 3.48 (*)    Hemoglobin 10.8 (*)    HCT 32.8 (*)    All other components within normal limits  PROTIME-INR  TYPE AND SCREEN  ABO/RH    EKG None  Radiology Dg Chest 1 View  Result Date: 01/21/2018 CLINICAL DATA:  Patient fell today and is complaining of left hip pain. Nonsmoker. History of coronary artery disease and diabetes. No current chest complaints. EXAM: CHEST  1 VIEW COMPARISON:  PA and lateral chest x-ray of November 11, 2017. FINDINGS: The lungs are adequately inflated and clear. The heart and pulmonary vascularity are normal. The mediastinum is normal in width. There is calcification in the wall of the aortic arch. There is faintly calcified nodularity within the left breast. There is no pleural effusion. The bony thorax exhibits no acute abnormality. IMPRESSION: There is no acute cardiopulmonary abnormality. Thoracic aortic atherosclerosis. Electronically Signed   By: David  Martinique M.D.   On: 01/21/2018 13:52   Ct Head Wo Contrast  Result Date: 01/21/2018 CLINICAL DATA:  [place] today striking head on concrete, high clinical risk of cervical spine trauma, history hypertension, type II diabetes mellitus, coronary artery disease EXAM: CT HEAD WITHOUT CONTRAST CT CERVICAL SPINE WITHOUT CONTRAST TECHNIQUE: Multidetector CT imaging of the head and cervical spine was performed following the standard protocol without intravenous contrast. Multiplanar CT image reconstructions of the cervical spine were also generated. COMPARISON:  CT head 03/28/2017 FINDINGS: CT HEAD FINDINGS Brain: Generalized atrophy. Normal ventricular morphology. No midline shift or mass effect. Small vessel chronic ischemic changes of deep cerebral white matter. No intracranial hemorrhage, mass lesion, evidence of acute infarction, or extra-axial fluid collection. Vascular: Atherosclerotic calcification of internal carotid arteries at skull base  Skull: Intact Sinuses/Orbits: Visualized paranasal sinuses and mastoid air cells clear Other: N/A CT CERVICAL SPINE FINDINGS Alignment: Normal Skull base and vertebrae: Visualized skull base intact. Osseous mineralization normal. T by heights maintained without fracture, subluxation, or bone destruction. Scattered facet degenerative changes. Soft tissues and spinal canal: Prevertebral soft tissues normal  thickness. 9 mm nonspecific RIGHT thyroid nodule. Cervical soft tissues otherwise unremarkable. Disc levels: Multilevel disc space narrowing and endplate spur formation greatest at C5-C6 and C6-C7. Uncovertebral spurs encroach upon cervical neural foramina bilaterally at C5-C6 and C6-C7. Bulging discs noted at C5-C6 and C6-C7. Upper chest: Minimal biapical scarring. Other: Atherosclerotic calcifications at the carotid bifurcations bilaterally. IMPRESSION: Atrophy with small vessel chronic ischemic changes of deep cerebral white matter. No acute intracranial abnormalities. Degenerative disc and facet disease changes of the cervical spine. No acute cervical spine abnormalities. Electronically Signed   By: Lavonia Dana M.D.   On: 01/21/2018 14:16   Ct Cervical Spine Wo Contrast  Result Date: 01/21/2018 CLINICAL DATA:  [place] today striking head on concrete, high clinical risk of cervical spine trauma, history hypertension, type II diabetes mellitus, coronary artery disease EXAM: CT HEAD WITHOUT CONTRAST CT CERVICAL SPINE WITHOUT CONTRAST TECHNIQUE: Multidetector CT imaging of the head and cervical spine was performed following the standard protocol without intravenous contrast. Multiplanar CT image reconstructions of the cervical spine were also generated. COMPARISON:  CT head 03/28/2017 FINDINGS: CT HEAD FINDINGS Brain: Generalized atrophy. Normal ventricular morphology. No midline shift or mass effect. Small vessel chronic ischemic changes of deep cerebral white matter. No intracranial hemorrhage, mass lesion,  evidence of acute infarction, or extra-axial fluid collection. Vascular: Atherosclerotic calcification of internal carotid arteries at skull base Skull: Intact Sinuses/Orbits: Visualized paranasal sinuses and mastoid air cells clear Other: N/A CT CERVICAL SPINE FINDINGS Alignment: Normal Skull base and vertebrae: Visualized skull base intact. Osseous mineralization normal. T by heights maintained without fracture, subluxation, or bone destruction. Scattered facet degenerative changes. Soft tissues and spinal canal: Prevertebral soft tissues normal thickness. 9 mm nonspecific RIGHT thyroid nodule. Cervical soft tissues otherwise unremarkable. Disc levels: Multilevel disc space narrowing and endplate spur formation greatest at C5-C6 and C6-C7. Uncovertebral spurs encroach upon cervical neural foramina bilaterally at C5-C6 and C6-C7. Bulging discs noted at C5-C6 and C6-C7. Upper chest: Minimal biapical scarring. Other: Atherosclerotic calcifications at the carotid bifurcations bilaterally. IMPRESSION: Atrophy with small vessel chronic ischemic changes of deep cerebral white matter. No acute intracranial abnormalities. Degenerative disc and facet disease changes of the cervical spine. No acute cervical spine abnormalities. Electronically Signed   By: Lavonia Dana M.D.   On: 01/21/2018 14:16   Dg Hip Unilat With Pelvis 2-3 Views Left  Result Date: 01/21/2018 CLINICAL DATA:  The patient fell today and is complaining of left hip pain. EXAM: DG HIP (WITH OR WITHOUT PELVIS) 2-3V LEFT COMPARISON:  No recent studies in [facility] FINDINGS: The patient has sustained an acute subcapital fracture of the left hip. There is mild superior migration of the proximal femur with respect to the mildly rotated femoral head. The left acetabulum appears intact. No acute abnormality of the bony pelvis is observed. IMPRESSION: Acute subcapital fracture of the left hip. Electronically Signed   By: David  Martinique M.D.   On: 01/21/2018 13:54     Procedures Procedures (including critical care time)  Medications Ordered in ED Medications  morphine 4 MG/ML injection 4 mg (4 mg Intravenous Given 01/21/18 1322)  morphine 4 MG/ML injection 4 mg (4 mg Intravenous Given 01/21/18 1500)     Initial Impression / Assessment and Plan / ED Course  I have reviewed the triage vital signs and the nursing notes.  Pertinent labs & imaging results that were available during my care of the patient were reviewed by me and considered in my medical decision making (see chart for  details).     Patient presenting for evaluation after mechanical fall.  Physical exam shows obvious deformity of the left hip with left leg shortening and rotation.  Otherwise neurovascularly intact, good pedal pulse.  As patient hit her head, will obtain CT head and neck, will obtain x-ray of left hip.  Preop labs and EKG ordered.  Morphine for pain.  X-ray viewed and interpreted by me, shows left hip fracture.  CT head, neck, and chest x-ray reassuring, no other acute finding or trauma.  Discussed with Hilbert Odor from orthopedics, he will evaluate the patient.  Patient to go to the OR this afternoon.  She is still n.p.o.  Will admit to hospitalist service.  Discussed with hospitalist, patient to be admitted under [facility] service.  Final Clinical Impressions(s) / ED Diagnoses   Final diagnoses:  Fall, initial encounter  Closed fracture of left hip, initial encounter [facility])    ED Discharge Orders    None       Franchot Heidelberg, PA-C 01/21/18 1609    Milton Ferguson, MD 01/22/18 0800

## 2018-01-21 NOTE — Consult Note (Addendum)
Reason for Consult:Left hip fx Referring Physician: Tyeesha [name] is an 84 y.o. female.  HPI: Stephanie [name] was about to sweep her porch this morning when her shoe got caught on something and she fell onto her left side. She had immediate pain and could not get up. She was brought to the ED where x-rays showed a left subcapital femur fracture and orthopedic surgery was consulted. She is retired and lives at home with her husband. She takes Aggrenox for a TIA from about a year to 18 months ago. Since then she favors her left leg and has fallen 4 or so times before this one.  Past Medical History:  Diagnosis Date  . Allergy    allergic rhinitis  . Anxiety   . Arthritis    osteoarthritis  . Degenerative disk disease    in neck  . Diabetes mellitus    type II  . Hyperlipidemia   . Hypertension   . Hypoaldosteronism ([place]) 6/12   from DM causing hyperkalemia   . Osteopenia   . Periodontal disease   . Peripheral vascular disease ([place])   . Transient cerebral ischemia 11/06/2016    Past Surgical History:  Procedure Laterality Date  . CARDIAC CATHETERIZATION     x2 STENT  . CHOLECYSTECTOMY    . ESOPHAGOGASTRODUODENOSCOPY  03/2004   gastritis by biopsy  . GUM SURGERY  06/2010   laser surgery for gums  . MM BREAST STEREO BX*L*R/S  12/12   normal  . TONSILLECTOMY    . TRANSTHORACIC ECHOCARDIOGRAM  11/2016   EF 55-60%, grade 1 DD with elevated LV end diastolic filling pressures, mildly dilated LA, mild TR and PR.   . TUBAL LIGATION      Family History  Problem Relation Age of Onset  . Cancer Mother        vaginal CA in situ  . Hypertension Mother   . Heart disease Mother        CAD and MI  . Heart disease Father        CAD  . Diabetes Father   . Heart disease Son        congenital valve problem    Social History:  reports that she has never smoked. She has never used smokeless tobacco. She reports that she does not drink alcohol or use drugs.  Allergies:  Allergies   Allergen Reactions  . Ace Inhibitors     REACTION: generic, reaction not known  . Amlodipine Besy-Benazepril Hcl     REACTION: chest pain  . Clopidogrel Bisulfate     REACTION: GI  . Erythromycin     REACTION: GI upset    Medications: I have reviewed the patient's current medications.  Results for orders placed or performed during the hospital encounter of 01/21/18 (from the past 48 hour(s))  Basic metabolic panel     Status: Abnormal   Collection Time: 01/21/18  1:23 PM  Result Value Ref Range   Sodium 138 135 - 145 mmol/L   Potassium 4.7 3.5 - 5.1 mmol/L   Chloride 106 98 - 111 mmol/L    Comment: Please note change in reference range.   CO2 23 22 - 32 mmol/L   Glucose, Bld 164 (H) 70 - 99 mg/dL    Comment: Please note change in reference range.   BUN 18 8 - 23 mg/dL    Comment: Please note change in reference range.   Creatinine, Ser 1.02 (H) 0.44 - 1.00 mg/dL  Calcium 9.7 8.9 - 10.3 mg/dL   GFR calc non Af Amer 50 (L) >60 mL/min   GFR calc Af Amer 58 (L) >60 mL/min    Comment: (NOTE) The eGFR has been calculated using the CKD EPI equation. This calculation has not been validated in all clinical situations. eGFR's persistently <60 mL/min signify possible Chronic Kidney Disease.    Anion gap 9 5 - 15    Comment: Performed at [facility] [address] [place], [place] [address]  CBC WITH DIFFERENTIAL     Status: Abnormal   Collection Time: 01/21/18  1:23 PM  Result Value Ref Range   WBC 9.6 4.0 - 10.5 K/uL   RBC 3.48 (L) 3.87 - 5.11 MIL/uL   Hemoglobin 10.8 (L) 12.0 - 15.0 g/dL   HCT 32.8 (L) 36.0 - 46.0 %   MCV 94.3 78.0 - 100.0 fL   MCH 31.0 26.0 - 34.0 pg   MCHC 32.9 30.0 - 36.0 g/dL   RDW 13.2 11.5 - 15.5 %   Platelets 204 150 - 400 K/uL   Neutrophils Relative % 63 %   Neutro Abs 6.1 1.7 - 7.7 K/uL   Lymphocytes Relative 30 %   Lymphs Abs 2.9 0.7 - 4.0 K/uL   Monocytes Relative 5 %   Monocytes Absolute 0.5 0.1 - 1.0 K/uL   Eosinophils Relative  1 %   Eosinophils Absolute 0.1 0.0 - 0.7 K/uL   Basophils Relative 0 %   Basophils Absolute 0.0 0.0 - 0.1 K/uL   Immature Granulocytes 1 %   Abs Immature Granulocytes 0.1 0.0 - 0.1 K/uL    Comment: Performed at [facility] [address] [place], [place] [address]  Protime-INR     Status: None   Collection Time: 01/21/18  1:23 PM  Result Value Ref Range   Prothrombin Time 14.0 11.4 - 15.2 seconds   INR 1.09     Comment: Performed at [facility] [address], [place], [place] [address]  Type and screen [place]     Status: None   Collection Time: 01/21/18  1:23 PM  Result Value Ref Range   ABO/RH(D) A NEG    Antibody Screen NEG    Sample Expiration      01/24/2018 Performed at [facility], [place] [address], [place], [place] [address]   ABO/Rh     Status: None   Collection Time: 01/21/18  1:23 PM  Result Value Ref Range   ABO/RH(D)      A NEG Performed at [facility] [address] [place],  [address]     Dg Chest 1 View  Result Date: 01/21/2018 CLINICAL DATA:  Patient fell today and is complaining of left hip pain. Nonsmoker. History of coronary artery disease and diabetes. No current chest complaints. EXAM: CHEST  1 VIEW COMPARISON:  PA and lateral chest x-ray of November 11, 2017. FINDINGS: The lungs are adequately inflated and clear. The heart and pulmonary vascularity are normal. The mediastinum is normal in width. There is calcification in the wall of the aortic arch. There is faintly calcified nodularity within the left breast. There is no pleural effusion. The bony thorax exhibits no acute abnormality. IMPRESSION: There is no acute cardiopulmonary abnormality. Thoracic aortic atherosclerosis. Electronically Signed   By: David  Martinique M.D.   On: 01/21/2018 13:52   Ct Head Wo Contrast  Result Date: 01/21/2018 CLINICAL DATA:  [place] today striking head  on concrete, high clinical risk of cervical spine trauma, history  hypertension, type II diabetes mellitus, coronary artery disease EXAM: CT HEAD WITHOUT CONTRAST CT CERVICAL SPINE WITHOUT CONTRAST TECHNIQUE: Multidetector CT imaging of the head and cervical spine was performed following the standard protocol without intravenous contrast. Multiplanar CT image reconstructions of the cervical spine were also generated. COMPARISON:  CT head 03/28/2017 FINDINGS: CT HEAD FINDINGS Brain: Generalized atrophy. Normal ventricular morphology. No midline shift or mass effect. Small vessel chronic ischemic changes of deep cerebral white matter. No intracranial hemorrhage, mass lesion, evidence of acute infarction, or extra-axial fluid collection. Vascular: Atherosclerotic calcification of internal carotid arteries at skull base Skull: Intact Sinuses/Orbits: Visualized paranasal sinuses and mastoid air cells clear Other: N/A CT CERVICAL SPINE FINDINGS Alignment: Normal Skull base and vertebrae: Visualized skull base intact. Osseous mineralization normal. T by heights maintained without fracture, subluxation, or bone destruction. Scattered facet degenerative changes. Soft tissues and spinal canal: Prevertebral soft tissues normal thickness. 9 mm nonspecific RIGHT thyroid nodule. Cervical soft tissues otherwise unremarkable. Disc levels: Multilevel disc space narrowing and endplate spur formation greatest at C5-C6 and C6-C7. Uncovertebral spurs encroach upon cervical neural foramina bilaterally at C5-C6 and C6-C7. Bulging discs noted at C5-C6 and C6-C7. Upper chest: Minimal biapical scarring. Other: Atherosclerotic calcifications at the carotid bifurcations bilaterally. IMPRESSION: Atrophy with small vessel chronic ischemic changes of deep cerebral white matter. No acute intracranial abnormalities. Degenerative disc and facet disease changes of the cervical spine. No acute cervical spine abnormalities. Electronically Signed   By: Lavonia Dana M.D.   On: 01/21/2018 14:16   Ct Cervical Spine Wo  Contrast  Result Date: 01/21/2018 CLINICAL DATA:  [place] today striking head on concrete, high clinical risk of cervical spine trauma, history hypertension, type II diabetes mellitus, coronary artery disease EXAM: CT HEAD WITHOUT CONTRAST CT CERVICAL SPINE WITHOUT CONTRAST TECHNIQUE: Multidetector CT imaging of the head and cervical spine was performed following the standard protocol without intravenous contrast. Multiplanar CT image reconstructions of the cervical spine were also generated. COMPARISON:  CT head 03/28/2017 FINDINGS: CT HEAD FINDINGS Brain: Generalized atrophy. Normal ventricular morphology. No midline shift or mass effect. Small vessel chronic ischemic changes of deep cerebral white matter. No intracranial hemorrhage, mass lesion, evidence of acute infarction, or extra-axial fluid collection. Vascular: Atherosclerotic calcification of internal carotid arteries at skull base Skull: Intact Sinuses/Orbits: Visualized paranasal sinuses and mastoid air cells clear Other: N/A CT CERVICAL SPINE FINDINGS Alignment: Normal Skull base and vertebrae: Visualized skull base intact. Osseous mineralization normal. T by heights maintained without fracture, subluxation, or bone destruction. Scattered facet degenerative changes. Soft tissues and spinal canal: Prevertebral soft tissues normal thickness. 9 mm nonspecific RIGHT thyroid nodule. Cervical soft tissues otherwise unremarkable. Disc levels: Multilevel disc space narrowing and endplate spur formation greatest at C5-C6 and C6-C7. Uncovertebral spurs encroach upon cervical neural foramina bilaterally at C5-C6 and C6-C7. Bulging discs noted at C5-C6 and C6-C7. Upper chest: Minimal biapical scarring. Other: Atherosclerotic calcifications at the carotid bifurcations bilaterally. IMPRESSION: Atrophy with small vessel chronic ischemic changes of deep cerebral white matter. No acute intracranial abnormalities. Degenerative disc and facet disease changes of the  cervical spine. No acute cervical spine abnormalities. Electronically Signed   By: Lavonia Dana M.D.   On: 01/21/2018 14:16   Dg Hip Unilat With Pelvis 2-3 Views Left  Result Date: 01/21/2018 CLINICAL DATA:  The patient fell today and is complaining of left hip pain. EXAM: DG HIP (WITH OR WITHOUT PELVIS) 2-3V LEFT COMPARISON:  No recent studies in [facility] FINDINGS: The patient has sustained an acute subcapital fracture of the left hip. There is mild superior migration of the proximal femur with respect to the mildly rotated femoral head. The left acetabulum appears intact. No acute abnormality of the bony pelvis is observed. IMPRESSION: Acute subcapital fracture of the left hip. Electronically Signed   By: David  Martinique M.D.   On: 01/21/2018 13:54    Review of Systems  Constitutional: Negative for weight loss.  HENT: Negative for ear discharge, ear pain, hearing loss and tinnitus.   Eyes: Negative for blurred vision, double vision, photophobia and pain.  Respiratory: Negative for cough, sputum production and shortness of breath.   Cardiovascular: Negative for chest pain.  Gastrointestinal: Negative for abdominal pain, nausea and vomiting.  Genitourinary: Negative for dysuria, flank pain, frequency and urgency.  Musculoskeletal: Positive for joint pain (Left hip). Negative for back pain, falls, myalgias and neck pain.  Neurological: Positive for focal weakness (LLE). Negative for dizziness, tingling, sensory change, loss of consciousness and headaches.  Endo/Heme/Allergies: Does not bruise/bleed easily.  Psychiatric/Behavioral: Negative for depression, memory loss and substance abuse. The patient is not nervous/anxious.    Blood pressure (!) 167/67, pulse 64, temperature 98.5 F (36.9 C), temperature source Oral, resp. rate 12, SpO2 97 %. Physical Exam  Constitutional: She appears well-developed and well-nourished. No distress.  HENT:  Head: Normocephalic and atraumatic.  Eyes: Conjunctivae are  normal. Right eye exhibits no discharge. Left eye exhibits no discharge. No scleral icterus.  Neck: Normal range of motion.  Cardiovascular: Normal rate and regular rhythm.  Respiratory: Effort normal. No respiratory distress.  Musculoskeletal:  LLE No traumatic wounds, ecchymosis, or rash  TTP hip  No knee or ankle effusion  Knee stable to varus/ valgus and anterior/posterior stress  Sens DPN, SPN, TN intact  Motor EHL, ext, flex, evers 5/5  DP 2+, PT 1+, No significant edema  Neurological: She is alert.  Skin: Skin is warm and dry. She is not diaphoretic.  Psychiatric: She has a normal mood and affect. Her behavior is normal.    Assessment/Plan: Fall Left subcapital femur fx -- Will need hemiarthroplasty by Dr. Mardelle Matte, plan tomorrow. NPO after MN. HTN, DM, s/p TIA on Aggrenox -- IM to admit, hold Aggrenox    Lisette Abu, PA-C Orthopedic Surgery [phone] 01/21/2018, 3:11 PM   Reviewed films, agree with above.  Plan for left hip hemi tomorrow probably around 3p.  Johnny Bridge, MD

## 2018-01-21 NOTE — Anesthesia Procedure Notes (Signed)
Procedure Name: Intubation Date/Time: 01/21/2018 5:56 PM Performed by: Imagene Riches, CRNA Pre-anesthesia Checklist: Patient identified, Emergency Drugs available, Suction available and Patient being monitored Patient Re-evaluated:Patient Re-evaluated prior to induction Oxygen Delivery Method: Circle System Utilized Preoxygenation: Pre-oxygenation with 100% oxygen Induction Type: IV induction Ventilation: Mask ventilation without difficulty Laryngoscope Size: Miller and 2 Grade View: Grade I Tube type: Oral Tube size: 7.0 mm Number of attempts: 3 Airway Equipment and Method: Stylet and Oral airway Placement Confirmation: ETT inserted through vocal cords under direct vision,  positive ETCO2 and breath sounds checked- equal and bilateral Secured at: 21 cm Tube secured with: Tape Dental Injury: Teeth and Oropharynx as per pre-operative assessment  Comments: First DL by CRNA, miller 2, unable to obtain view. Second DL my Dr. Conrad [place] with Mac 3, esophageal intubation. Third DL by Dr. Conrad [place], [place] 2, grade 1 view. Difficulty due to insufficient muscle relaxation with initial attempts.

## 2018-01-22 ENCOUNTER — Other Ambulatory Visit: Payer: Self-pay

## 2018-01-22 ENCOUNTER — Encounter (HOSPITAL_COMMUNITY): Payer: Self-pay | Admitting: Orthopedic Surgery

## 2018-01-22 DIAGNOSIS — S72002A Fracture of unspecified part of neck of left femur, initial encounter for closed fracture: Secondary | ICD-10-CM

## 2018-01-22 DIAGNOSIS — N182 Chronic kidney disease, stage 2 (mild): Secondary | ICD-10-CM | POA: Diagnosis present

## 2018-01-22 DIAGNOSIS — S72012D Unspecified intracapsular fracture of left femur, subsequent encounter for closed fracture with routine healing: Secondary | ICD-10-CM

## 2018-01-22 LAB — CBC
HCT: 24.9 % — ABNORMAL LOW (ref 36.0–46.0)
Hemoglobin: 8 g/dL — ABNORMAL LOW (ref 12.0–15.0)
MCH: 30.4 pg (ref 26.0–34.0)
MCHC: 32.1 g/dL (ref 30.0–36.0)
MCV: 94.7 fL (ref 78.0–100.0)
PLATELETS: 173 10*3/uL (ref 150–400)
RBC: 2.63 MIL/uL — AB (ref 3.87–5.11)
RDW: 13.2 % (ref 11.5–15.5)
WBC: 11 10*3/uL — ABNORMAL HIGH (ref 4.0–10.5)

## 2018-01-22 LAB — BASIC METABOLIC PANEL
Anion gap: 11 (ref 5–15)
BUN: 25 mg/dL — AB (ref 8–23)
CALCIUM: 8.5 mg/dL — AB (ref 8.9–10.3)
CO2: 24 mmol/L (ref 22–32)
Chloride: 100 mmol/L (ref 98–111)
Creatinine, Ser: 1.32 mg/dL — ABNORMAL HIGH (ref 0.44–1.00)
GFR calc Af Amer: 43 mL/min — ABNORMAL LOW (ref >60)
GFR calc non Af Amer: 37 mL/min — ABNORMAL LOW (ref >60)
Glucose, Bld: 214 mg/dL — ABNORMAL HIGH (ref 70–99)
Potassium: 5.2 mmol/L — ABNORMAL HIGH (ref 3.5–5.1)
Sodium: 135 mmol/L (ref 135–145)

## 2018-01-22 LAB — GLUCOSE, CAPILLARY
Glucose-Capillary: 178 mg/dL — ABNORMAL HIGH (ref 70–99)
Glucose-Capillary: 220 mg/dL — ABNORMAL HIGH (ref 70–99)

## 2018-01-22 LAB — CK: CK TOTAL: 234 U/L (ref 38–234)

## 2018-01-22 MED ORDER — ZOLPIDEM TARTRATE 5 MG PO TABS: 5.0000 mg | ORAL_TABLET | Freq: Every evening | ORAL | Status: DC | PRN

## 2018-01-22 MED ORDER — SODIUM POLYSTYRENE SULFONATE 15 GM/60ML PO SUSP
15.0000 g | Freq: Once | ORAL | Status: AC
  Administered 2018-01-22: 15 g via ORAL
  Filled 2018-01-22: qty 60

## 2018-01-22 MED ORDER — APIXABAN 2.5 MG PO TABS: 2.5000 mg | ORAL_TABLET | Freq: Two times a day (BID) | ORAL | 0 refills | Status: DC

## 2018-01-22 MED ORDER — HYDRALAZINE HCL 20 MG/ML IJ SOLN: 5.0000 mg | INTRAMUSCULAR | Status: DC | PRN

## 2018-01-22 MED ORDER — POTASSIUM CHLORIDE IN NACL 20-0.9 MEQ/L-% IV SOLN
INTRAVENOUS | Status: DC
  Administered 2018-01-22: 08:00:00 via INTRAVENOUS

## 2018-01-22 MED ORDER — APIXABAN 2.5 MG PO TABS
2.5000 mg | ORAL_TABLET | Freq: Two times a day (BID) | ORAL | Status: DC
  Administered 2018-01-23 – 2018-01-24 (×3): 2.5 mg via ORAL
  Filled 2018-01-22 (×3): qty 1

## 2018-01-22 NOTE — Progress Notes (Signed)
PT Cancellation Note  Patient Details Name: Stephanie [name] MRN: [id number] DOB: [birth year]   Cancelled Treatment:    Reason Eval/Treat Not Completed: Patient not medically ready;Active bedrest order. PT will continue to follow acutely and await updated activity orders.   [place] 01/22/2018, 7:58 AM

## 2018-01-22 NOTE — Plan of Care (Signed)
  Problem: Pain Managment: Goal: General experience of comfort will improve Outcome: Progressing   Problem: Safety: Goal: Ability to remain free from injury will improve Outcome: Progressing   Problem: Skin Integrity: Goal: Risk for impaired skin integrity will decrease Outcome: Progressing   

## 2018-01-22 NOTE — Progress Notes (Signed)
PROGRESS NOTE    Stephanie [name]  OZD:[id number] DOB: 1933-09-15 DOA: 01/21/2018 PCP: Abner Greenspan, MD  Brief Narrative:Stephanie [name] is a 84 y.o. female with medical history significant of hypertension, hyperlipidemia, diabetes mellitus, TIA, GERD, depression, PVD, CAD, stent placement, anemia, CKD-2, who presentsed with fall, left hip pain. X-ray of left hip showed acute subcapital fracture of the left hip. Orthopedics Dr. Mardelle Matte consulted, she underwent left hip hemiarthroplasty 7/10.   Assessment & Plan:   Subcapital fracture of left hip (HCC) -status post left hip hemiarthroplasty 7/10 -Started on low-dose xarelto for DVT prophylaxis by orthopedics -PT OT consult   AKi on CKD2 with hyperkalemia  -Creatinine improved, diuretic held -Stop Toradol, stop supplemental potassium in IV fluids   History of TIA -Continue Lipitor, resume Aggrenox tomorrow   history of CAD -Stable, continue metoprolol and Lipitor with holding parameters  Type 2 diabetes mellitus -Last hemoglobin A1c was 6.3 in February -Continue sliding scale insulin,  Glipizide and metformin held  DVT prophylaxis:xarelto Code Status: full code Family Communication:spouse at bedside Disposition Plan: SNF versus home with home health serv  Consultants:   orthopedics   Procedures: left hip hemiarthroplasty 7/10 by Dr. Marchia Bond  Antimicrobials:    Subjective: -feels well, no complaints, mild soreness surgical site  Objective: Vitals:   01/22/18 0007 01/22/18 0007 01/22/18 0047 01/22/18 0424  BP: (!) 114/50 (!) 114/50 (!) 107/50 (!) 106/50  Pulse: 62 62 (!) 57 (!) 57  Resp: 18  18   Temp: 97.8 F (36.6 C) 97.8 F (36.6 C) (!) 97.4 F (36.3 C) 97.7 F (36.5 C)  TempSrc: Oral Oral Oral Oral  SpO2: 100% 100% 100% 100%  Weight:      Height:        Intake/Output Summary (Last 24 hours) at 01/22/2018 1354 Last data filed at 01/22/2018 0900 Gross per 24 hour  Intake 428.66 ml  Output 200 ml    Net 228.66 ml   Filed Weights   01/21/18 1637  Weight: 48.8 kg (107 lb 9.4 oz)    Examination:  General exam: Appears calm and comfortable  Respiratory system: Clear to auscultation.  Cardiovascular system: S1 & S2 heard, RRR. No JVD Gastrointestinal system: Abdomen is nondistended, soft and nontender.Normal bowel sounds heard. Central nervous system: Alert and oriented. No focal neurological deficits. Extremities: Symmetric 5 x 5 power. Skin: No rashes, lesions or ulcers  Data Reviewed:   CBC: Recent Labs  Lab 01/21/18 1323 01/22/18 0337  WBC 9.6 11.0*  NEUTROABS 6.1  --   HGB 10.8* 8.0*  HCT 32.8* 24.9*  MCV 94.3 94.7  PLT 204 259   Basic Metabolic Panel: Recent Labs  Lab 01/21/18 1323 01/22/18 0337  NA 138 135  K 4.7 5.2*  CL 106 100  CO2 23 24  GLUCOSE 164* 214*  BUN 18 25*  CREATININE 1.02* 1.32*  CALCIUM 9.7 8.5*   GFR: Estimated Creatinine Clearance: 25.2 mL/min (A) (by C-G formula based on SCr of 1.32 mg/dL (H)). Liver Function Tests: No results for input(s): AST, ALT, ALKPHOS, BILITOT, PROT, ALBUMIN in the last 168 hours. No results for input(s): LIPASE, AMYLASE in the last 168 hours. No results for input(s): AMMONIA in the last 168 hours. Coagulation Profile: Recent Labs  Lab 01/21/18 1323  INR 1.09   Cardiac Enzymes: Recent Labs  Lab 01/22/18 0337  CKTOTAL 234   BNP (last 3 results) No results for input(s): PROBNP in the last 8760 hours. HbA1C: No results for input(s):  HGBA1C in the last 72 hours. CBG: Recent Labs  Lab 01/21/18 1656 01/21/18 2009 01/21/18 2230 01/22/18 0619  GLUCAP 153* 177* 223* 178*   Lipid Profile: No results for input(s): CHOL, HDL, LDLCALC, TRIG, CHOLHDL, LDLDIRECT in the last 72 hours. Thyroid Function Tests: No results for input(s): TSH, T4TOTAL, FREET4, T3FREE, THYROIDAB in the last 72 hours. Anemia Panel: No results for input(s): VITAMINB12, FOLATE, FERRITIN, TIBC, IRON, RETICCTPCT in the last 72  hours. Urine analysis: No results found for: COLORURINE, APPEARANCEUR, LABSPEC, PHURINE, GLUCOSEU, HGBUR, BILIRUBINUR, KETONESUR, PROTEINUR, UROBILINOGEN, NITRITE, LEUKOCYTESUR Sepsis Labs: @LABRCNTIP (procalcitonin:4,lacticidven:4)  )No results found for this or any previous visit (from the past 240 hour(s)).       Radiology Studies: Dg Chest 1 View  Result Date: 01/21/2018 CLINICAL DATA:  Patient fell today and is complaining of left hip pain. Nonsmoker. History of coronary artery disease and diabetes. No current chest complaints. EXAM: CHEST  1 VIEW COMPARISON:  PA and lateral chest x-ray of November 11, 2017. FINDINGS: The lungs are adequately inflated and clear. The heart and pulmonary vascularity are normal. The mediastinum is normal in width. There is calcification in the wall of the aortic arch. There is faintly calcified nodularity within the left breast. There is no pleural effusion. The bony thorax exhibits no acute abnormality. IMPRESSION: There is no acute cardiopulmonary abnormality. Thoracic aortic atherosclerosis. Electronically Signed   By: David  Martinique M.D.   On: 01/21/2018 13:52   Ct Head Wo Contrast  Result Date: 01/21/2018 CLINICAL DATA:  [place] today striking head on concrete, high clinical risk of cervical spine trauma, history hypertension, type II diabetes mellitus, coronary artery disease EXAM: CT HEAD WITHOUT CONTRAST CT CERVICAL SPINE WITHOUT CONTRAST TECHNIQUE: Multidetector CT imaging of the head and cervical spine was performed following the standard protocol without intravenous contrast. Multiplanar CT image reconstructions of the cervical spine were also generated. COMPARISON:  CT head 03/28/2017 FINDINGS: CT HEAD FINDINGS Brain: Generalized atrophy. Normal ventricular morphology. No midline shift or mass effect. Small vessel chronic ischemic changes of deep cerebral white matter. No intracranial hemorrhage, mass lesion, evidence of acute infarction, or extra-axial  fluid collection. Vascular: Atherosclerotic calcification of internal carotid arteries at skull base Skull: Intact Sinuses/Orbits: Visualized paranasal sinuses and mastoid air cells clear Other: N/A CT CERVICAL SPINE FINDINGS Alignment: Normal Skull base and vertebrae: Visualized skull base intact. Osseous mineralization normal. T by heights maintained without fracture, subluxation, or bone destruction. Scattered facet degenerative changes. Soft tissues and spinal canal: Prevertebral soft tissues normal thickness. 9 mm nonspecific RIGHT thyroid nodule. Cervical soft tissues otherwise unremarkable. Disc levels: Multilevel disc space narrowing and endplate spur formation greatest at C5-C6 and C6-C7. Uncovertebral spurs encroach upon cervical neural foramina bilaterally at C5-C6 and C6-C7. Bulging discs noted at C5-C6 and C6-C7. Upper chest: Minimal biapical scarring. Other: Atherosclerotic calcifications at the carotid bifurcations bilaterally. IMPRESSION: Atrophy with small vessel chronic ischemic changes of deep cerebral white matter. No acute intracranial abnormalities. Degenerative disc and facet disease changes of the cervical spine. No acute cervical spine abnormalities. Electronically Signed   By: Lavonia Dana M.D.   On: 01/21/2018 14:16   Ct Cervical Spine Wo Contrast  Result Date: 01/21/2018 CLINICAL DATA:  [place] today striking head on concrete, high clinical risk of cervical spine trauma, history hypertension, type II diabetes mellitus, coronary artery disease EXAM: CT HEAD WITHOUT CONTRAST CT CERVICAL SPINE WITHOUT CONTRAST TECHNIQUE: Multidetector CT imaging of the head and cervical spine was performed following the standard protocol without intravenous  contrast. Multiplanar CT image reconstructions of the cervical spine were also generated. COMPARISON:  CT head 03/28/2017 FINDINGS: CT HEAD FINDINGS Brain: Generalized atrophy. Normal ventricular morphology. No midline shift or mass effect. Small vessel  chronic ischemic changes of deep cerebral white matter. No intracranial hemorrhage, mass lesion, evidence of acute infarction, or extra-axial fluid collection. Vascular: Atherosclerotic calcification of internal carotid arteries at skull base Skull: Intact Sinuses/Orbits: Visualized paranasal sinuses and mastoid air cells clear Other: N/A CT CERVICAL SPINE FINDINGS Alignment: Normal Skull base and vertebrae: Visualized skull base intact. Osseous mineralization normal. T by heights maintained without fracture, subluxation, or bone destruction. Scattered facet degenerative changes. Soft tissues and spinal canal: Prevertebral soft tissues normal thickness. 9 mm nonspecific RIGHT thyroid nodule. Cervical soft tissues otherwise unremarkable. Disc levels: Multilevel disc space narrowing and endplate spur formation greatest at C5-C6 and C6-C7. Uncovertebral spurs encroach upon cervical neural foramina bilaterally at C5-C6 and C6-C7. Bulging discs noted at C5-C6 and C6-C7. Upper chest: Minimal biapical scarring. Other: Atherosclerotic calcifications at the carotid bifurcations bilaterally. IMPRESSION: Atrophy with small vessel chronic ischemic changes of deep cerebral white matter. No acute intracranial abnormalities. Degenerative disc and facet disease changes of the cervical spine. No acute cervical spine abnormalities. Electronically Signed   By: Lavonia Dana M.D.   On: 01/21/2018 14:16   Dg Hip Port Unilat With Pelvis 1v Left  Result Date: 01/21/2018 CLINICAL DATA:  Hemiarthroplasty EXAM: DG HIP (WITH OR WITHOUT PELVIS) 1V PORT LEFT COMPARISON:  Earlier today FINDINGS: Left hip hemiarthroplasty is located. Negative for periprosthetic fracture. IMPRESSION: No acute finding after left hip arthroplasty. Electronically Signed   By: Monte Fantasia M.D.   On: 01/21/2018 20:48   Dg Hip Unilat With Pelvis 2-3 Views Left  Result Date: 01/21/2018 CLINICAL DATA:  The patient fell today and is complaining of left hip pain.  EXAM: DG HIP (WITH OR WITHOUT PELVIS) 2-3V LEFT COMPARISON:  No recent studies in [facility] FINDINGS: The patient has sustained an acute subcapital fracture of the left hip. There is mild superior migration of the proximal femur with respect to the mildly rotated femoral head. The left acetabulum appears intact. No acute abnormality of the bony pelvis is observed. IMPRESSION: Acute subcapital fracture of the left hip. Electronically Signed   By: David  Martinique M.D.   On: 01/21/2018 13:54        Scheduled Meds: . acetaminophen  500 mg Oral Q6H  . [START ON 01/23/2018] apixaban  2.5 mg Oral BID  . atorvastatin  20 mg Oral q1800  . docusate sodium  100 mg Oral BID  . famotidine  20 mg Oral Daily  . ferrous sulfate  325 mg Oral TID PC  . insulin aspart  0-5 Units Subcutaneous QHS  . insulin aspart  0-9 Units Subcutaneous TID WC  . metoprolol succinate  100 mg Oral Daily  . PARoxetine  40 mg Oral Daily   Continuous Infusions:   LOS: 1 day    Time spent: 27min    Domenic Polite, MD [facility] Page via [email or web address], password [email or web address] After 7PM please contact night-coverage  01/22/2018, 1:54 PM

## 2018-01-22 NOTE — Discharge Instructions (Addendum)
INSTRUCTIONS AFTER JOINT REPLACEMENT  ° °o Remove items at home which could result in a fall. This includes throw rugs or furniture in walking pathways °o ICE to the affected joint every three hours while awake for 30 minutes at a time, for at least the first 3-5 days, and then as needed for pain and swelling.  Continue to use ice for pain and swelling. You may notice swelling that will progress down to the foot and ankle.  This is normal after surgery.  Elevate your leg when you are not up walking on it.   °o Continue to use the breathing machine you got in the hospital (incentive spirometer) which will help keep your temperature down.  It is common for your temperature to cycle up and down following surgery, especially at night when you are not up moving around and exerting yourself.  The breathing machine keeps your lungs expanded and your temperature down. ° ° °DIET:  As you were doing prior to hospitalization, we recommend a well-balanced diet. ° °DRESSING / WOUND CARE / SHOWERING ° °You may change your dressing 3-5 days after surgery.  Then change the dressing every day with sterile gauze.  Please use good hand washing techniques before changing the dressing.  Do not use any lotions or creams on the incision until instructed by your surgeon. ° °ACTIVITY ° °o Increase activity slowly as tolerated, but follow the weight bearing instructions below.   °o No driving for 6 weeks or until further direction given by your physician.  You cannot drive while taking narcotics.  °o No lifting or carrying greater than 10 lbs. until further directed by your surgeon. °o Avoid periods of inactivity such as sitting longer than an hour when not asleep. This helps prevent blood clots.  °o You may return to work once you are authorized by your doctor.  ° ° ° °WEIGHT BEARING  ° °Weight bearing as tolerated with assist device (walker, cane, etc) as directed, use it as long as suggested by your surgeon or therapist, typically at  least 4-6 weeks. ° ° °EXERCISES ° °Results after joint replacement surgery are often greatly improved when you follow the exercise, range of motion and muscle strengthening exercises prescribed by your doctor. Safety measures are also important to protect the joint from further injury. Any time any of these exercises cause you to have increased pain or swelling, decrease what you are doing until you are comfortable again and then slowly increase them. If you have problems or questions, call your caregiver or physical therapist for advice.  ° °Rehabilitation is important following a joint replacement. After just a few days of immobilization, the muscles of the leg can become weakened and shrink (atrophy).  These exercises are designed to build up the tone and strength of the thigh and leg muscles and to improve motion. Often times heat used for twenty to thirty minutes before working out will loosen up your tissues and help with improving the range of motion but do not use heat for the first two weeks following surgery (sometimes heat can increase post-operative swelling).  ° °These exercises can be done on a training (exercise) mat, on the floor, on a table or on a bed. Use whatever works the best and is most comfortable for you.    Use music or television while you are exercising so that the exercises are a pleasant break in your day. This will make your life better with the exercises acting as a break   in your routine that you can look forward to.   Perform all exercises about fifteen times, three times per day or as directed.  You should exercise both the operative leg and the other leg as well. ° °Exercises include: °  °• Quad Sets - Tighten up the muscle on the front of the thigh (Quad) and hold for 5-10 seconds.   °• Straight Leg Raises - With your knee straight (if you were given a brace, keep it on), lift the leg to 60 degrees, hold for 3 seconds, and slowly lower the leg.  Perform this exercise against  resistance later as your leg gets stronger.  °• Leg Slides: Lying on your back, slowly slide your foot toward your buttocks, bending your knee up off the floor (only go as far as is comfortable). Then slowly slide your foot back down until your leg is flat on the floor again.  °• Angel Wings: Lying on your back spread your legs to the side as far apart as you can without causing discomfort.  °• Hamstring Strength:  Lying on your back, push your heel against the floor with your leg straight by tightening up the muscles of your buttocks.  Repeat, but this time bend your knee to a comfortable angle, and push your heel against the floor.  You may put a pillow under the heel to make it more comfortable if necessary.  ° °A rehabilitation program following joint replacement surgery can speed recovery and prevent re-injury in the future due to weakened muscles. Contact your doctor or a physical therapist for more information on knee rehabilitation.  ° ° °CONSTIPATION ° °Constipation is defined medically as fewer than three stools per week and severe constipation as less than one stool per week.  Even if you have a regular bowel pattern at home, your normal regimen is likely to be disrupted due to multiple reasons following surgery.  Combination of anesthesia, postoperative narcotics, change in appetite and fluid intake all can affect your bowels.  ° °YOU MUST use at least one of the following options; they are listed in order of increasing strength to get the job done.  They are all available over the counter, and you may need to use some, POSSIBLY even all of these options:   ° °Drink plenty of fluids (prune juice may be helpful) and high fiber foods °Colace 100 mg by mouth twice a day  °Senokot for constipation as directed and as needed Dulcolax (bisacodyl), take with full glass of water  °Miralax (polyethylene glycol) once or twice a day as needed. ° °If you have tried all these things and are unable to have a bowel  movement in the first 3-4 days after surgery call either your surgeon or your primary doctor.   ° °If you experience loose stools or diarrhea, hold the medications until you stool forms back up.  If your symptoms do not get better within 1 week or if they get worse, check with your doctor.  If you experience "the worst abdominal pain ever" or develop nausea or vomiting, please contact the office immediately for further recommendations for treatment. ° ° °ITCHING:  If you experience itching with your medications, try taking only a single pain pill, or even half a pain pill at a time.  You can also use Benadryl over the counter for itching or also to help with sleep.  ° °TED HOSE STOCKINGS:  Use stockings on both legs until for at least 2 weeks or as   directed by physician office. They may be removed at night for sleeping. ° °MEDICATIONS:  See your medication summary on the “After Visit Summary” that nursing will review with you.  You may have some home medications which will be placed on hold until you complete the course of blood thinner medication.  It is important for you to complete the blood thinner medication as prescribed. ° °PRECAUTIONS:  If you experience chest pain or shortness of breath - call 911 immediately for transfer to the hospital emergency department.  ° °If you develop a fever greater that 101 F, purulent drainage from wound, increased redness or drainage from wound, foul odor from the wound/dressing, or calf pain - CONTACT YOUR SURGEON.   °                                                °FOLLOW-UP APPOINTMENTS:  If you do not already have a post-op appointment, please call the office for an appointment to be seen by your surgeon.  Guidelines for how soon to be seen are listed in your “After Visit Summary”, but are typically between 1-4 weeks after surgery. ° °OTHER INSTRUCTIONS:  ° °Knee Replacement:  Do not place pillow under knee, focus on keeping the knee straight while resting. CPM  instructions: 0-90 degrees, 2 hours in the morning, 2 hours in the afternoon, and 2 hours in the evening. Place foam block, curve side up under heel at all times except when in CPM or when walking.  DO NOT modify, tear, cut, or change the foam block in any way. ° °MAKE SURE YOU:  °• Understand these instructions.  °• Get help right away if you are not doing well or get worse.  ° ° °Thank you for letting us be a part of your medical care team.  It is a privilege we respect greatly.  We hope these instructions will help you stay on track for a fast and full recovery!  ° ° ° °Information on my medicine - ELIQUIS® (apixaban) ° °This medication education was reviewed with me or my healthcare representative as part of my discharge preparation.  The pharmacist that spoke with me during my hospital stay was:  Yolinda Duerr Dien, RPH ° °Why was Eliquis® prescribed for you? °Eliquis® was prescribed for you to reduce the risk of blood clots forming after orthopedic surgery.   ° °What do You need to know about Eliquis®? °Take your Eliquis® TWICE DAILY - one tablet in the morning and one tablet in the evening with or without food.  It would be best to take the dose about the same time each day. ° °If you have difficulty swallowing the tablet whole please discuss with your pharmacist how to take the medication safely. ° °Take Eliquis® exactly as prescribed by your doctor and DO NOT stop taking Eliquis® without talking to the doctor who prescribed the medication.  Stopping without other medication to take the place of Eliquis® may increase your risk of developing a clot. ° °After discharge, you should have regular check-up appointments with your healthcare provider that is prescribing your Eliquis®. ° °What do you do if you miss a dose? °If a dose of ELIQUIS® is not taken at the scheduled time, take it as soon as possible on the same day and twice-daily administration should be resumed.  The dose should not   be doubled to make up for  a missed dose.  Do not take more than one tablet of ELIQUIS at the same time. ° °Important Safety Information °A possible side effect of Eliquis® is bleeding. You should call your healthcare provider right away if you experience any of the following: °? Bleeding from an injury or your nose that does not stop. °? Unusual colored urine (red or dark brown) or unusual colored stools (red or black). °? Unusual bruising for unknown reasons. °? A serious fall or if you hit your head (even if there is no bleeding). ° °Some medicines may interact with Eliquis® and might increase your risk of bleeding or clotting while on Eliquis®. To help avoid this, consult your healthcare provider or pharmacist prior to using any new prescription or non-prescription medications, including herbals, vitamins, non-steroidal anti-inflammatory drugs (NSAIDs) and supplements. ° °This website has more information on Eliquis® (apixaban): http://www.eliquis.com/eliquis/home ° °

## 2018-01-22 NOTE — Progress Notes (Signed)
Nutrition Consult/Brief Note  Patient identified on the Malnutrition Screening Tool (MST) Report.  Wt Readings from Last 15 Encounters:  01/21/18 107 lb 9.4 oz (48.8 kg)  11/11/17 107 lb 8 oz (48.8 kg)  10/06/17 110 lb (49.9 kg)  08/27/17 109 lb 4 oz (49.6 kg)  04/10/17 110 lb (49.9 kg)  03/28/17 109 lb 8 oz (49.7 kg)  02/11/17 113 lb 4 oz (51.4 kg)  11/06/16 110 lb 4 oz (50 kg)  09/10/16 115 lb (52.2 kg)  08/13/16 111 lb 12 oz (50.7 kg)  11/10/15 118 lb 12 oz (53.9 kg)  09/04/15 117 lb (53.1 kg)  03/03/15 121 lb 8 oz (55.1 kg)  08/31/14 123 lb (55.8 kg)  05/30/14 121 lb 8 oz (55.1 kg)   Body mass index is 20.33 kg/m. Patient meets criteria for Normal based on current BMI.   Current diet order is Carbohydrate Modified, patient is consuming approximately 75% of meals at this time.   Per readings above, pt's weight has been stable. Labs and medications reviewed.   No nutrition interventions warranted at this time. If nutrition issues arise, please consult RD.   Arthur Holms, RD, LDN Pager #: [phone] After-Hours Pager #: [phone]

## 2018-01-22 NOTE — Progress Notes (Addendum)
Patient ID: Stephanie [name], female   DOB: Nov 28, 1933, 84 y.o.   MRN: [id number]     Subjective:  Patient reports pain as mild.  Patient in bed and in no acute distress patient requesting to go home  Objective:   VITALS:   Vitals:   01/22/18 0007 01/22/18 0047 01/22/18 0424 01/22/18 1440  BP: (!) 114/50 (!) 107/50 (!) 106/50 (!) 120/47  Pulse: 62 (!) 57 (!) 57 66  Resp:  18  16  Temp: 97.8 F (36.6 C) (!) 97.4 F (36.3 C) 97.7 F (36.5 C) 98.7 F (37.1 C)  TempSrc: Oral Oral Oral Oral  SpO2: 100% 100% 100% 96%  Weight:      Height:        ABD soft Sensation intact distally Dorsiflexion/Plantar flexion intact Incision: dressing C/D/I and scant drainage   Lab Results  Component Value Date   WBC 11.0 (H) 01/22/2018   HGB 8.0 (L) 01/22/2018   HCT 24.9 (L) 01/22/2018   MCV 94.7 01/22/2018   PLT 173 01/22/2018   BMET    Component Value Date/Time   NA 135 01/22/2018 0337   K 5.2 (H) 01/22/2018 0337   CL 100 01/22/2018 0337   CO2 24 01/22/2018 0337   GLUCOSE 214 (H) 01/22/2018 0337   BUN 25 (H) 01/22/2018 0337   CREATININE 1.32 (H) 01/22/2018 0337   CREATININE 0.88 11/27/2010 0916   CALCIUM 8.5 (L) 01/22/2018 0337   GFRNONAA 37 (L) 01/22/2018 0337   GFRAA 43 (L) 01/22/2018 0337     Assessment/Plan: 1 Day Post-Op   Principal Problem:   Subcapital fracture of left hip (HCC) Active Problems:   Type 2 diabetes, uncontrolled, with retinopathy (HCC)   Essential hypertension   Transient cerebral ischemia   Fall   Depression   HLD (hyperlipidemia)   GERD (gastroesophageal reflux disease)   CAD (coronary artery disease)   Chronic kidney disease (CKD), stage II (mild)   Advance diet Up with therapy  ABLA continue to monitor Continue plan per [facility] WBAT Dry dressing PRN       Remonia Richter 01/22/2018, 4:21 PM  Discussed and agree with above.  Switched to eliquis given renal function.  Called pharmacy as outpatient and cancelled the [place].     Marchia Bond, MD Cell [phone]

## 2018-01-23 LAB — GLUCOSE, CAPILLARY
GLUCOSE-CAPILLARY: 256 mg/dL — AB (ref 70–99)
GLUCOSE-CAPILLARY: 282 mg/dL — AB (ref 70–99)
Glucose-Capillary: 205 mg/dL — ABNORMAL HIGH (ref 70–99)
Glucose-Capillary: 280 mg/dL — ABNORMAL HIGH (ref 70–99)

## 2018-01-23 LAB — PREPARE RBC (CROSSMATCH)

## 2018-01-23 LAB — CBC
HCT: 22.1 % — ABNORMAL LOW (ref 36.0–46.0)
Hemoglobin: 7.2 g/dL — ABNORMAL LOW (ref 12.0–15.0)
MCH: 31 pg (ref 26.0–34.0)
MCHC: 32.6 g/dL (ref 30.0–36.0)
MCV: 95.3 fL (ref 78.0–100.0)
PLATELETS: 165 10*3/uL (ref 150–400)
RBC: 2.32 MIL/uL — ABNORMAL LOW (ref 3.87–5.11)
RDW: 13.4 % (ref 11.5–15.5)
WBC: 11.6 10*3/uL — ABNORMAL HIGH (ref 4.0–10.5)

## 2018-01-23 LAB — BASIC METABOLIC PANEL
Anion gap: 12 (ref 5–15)
BUN: 25 mg/dL — AB (ref 8–23)
CO2: 24 mmol/L (ref 22–32)
CREATININE: 1.12 mg/dL — AB (ref 0.44–1.00)
Calcium: 8.3 mg/dL — ABNORMAL LOW (ref 8.9–10.3)
Chloride: 100 mmol/L (ref 98–111)
GFR calc non Af Amer: 45 mL/min — ABNORMAL LOW (ref >60)
GFR, EST AFRICAN AMERICAN: 52 mL/min — AB (ref >60)
Glucose, Bld: 207 mg/dL — ABNORMAL HIGH (ref 70–99)
Potassium: 3.9 mmol/L (ref 3.5–5.1)
SODIUM: 136 mmol/L (ref 135–145)

## 2018-01-23 MED ORDER — FUROSEMIDE 10 MG/ML IJ SOLN
20.0000 mg | Freq: Once | INTRAMUSCULAR | Status: AC
  Administered 2018-01-23: 20 mg via INTRAVENOUS
  Filled 2018-01-23: qty 2

## 2018-01-23 MED ORDER — SODIUM CHLORIDE 0.9% IV SOLUTION
Freq: Once | INTRAVENOUS | Status: AC
  Administered 2018-01-23: 11:00:00 via INTRAVENOUS

## 2018-01-23 NOTE — Progress Notes (Signed)
PROGRESS NOTE    [name]  WCH:[id number] DOB: [birth year] DOA: 01/21/2018 PCP: Abner Greenspan, MD  Brief Narrative:Stephanie [name] is a 84 y.o. female with medical history significant of hypertension, hyperlipidemia, diabetes mellitus, TIA, GERD, depression, PVD, CAD, stent placement, anemia, CKD-2, who presentsed with fall, left hip pain. X-ray of left hip showed acute subcapital fracture of the left hip. Orthopedics Dr. Mardelle Matte consulted, she underwent left hip hemiarthroplasty 3/53. -complicated by acute blood loss anemia  Assessment & Plan:   Subcapital fracture of left hip (HCC) -status post left hip hemiarthroplasty 7/10 -Started on low-dose xarelto for DVT prophylaxis by orthopedics -PT OT consult  Acute blood loss anemia -Due to postoperative blood loss and hemodilution -transfuse 2 units of PRBC per orthopedics, will add IV Lasix in between   AKi on CKD2 with hyperkalemia  -Creatinine improved, diuretic held -Stop Toradol, stop supplemental potassium in IV fluids   History of TIA -Continue Lipitor, plan to restart Aggrenox in 4 weeks once she is off Eliquis   history of CAD -Stable, continue metoprolol and Lipitor with holding parameters  Type 2 diabetes mellitus -Last hemoglobin A1c was 6.3 in February -Continue sliding scale insulin,  Glipizide and metformin held  DVT prophylaxis:xarelto Code Status: full code Family Communication:spouse at bedside Disposition Plan: SNF versus home with home health serv  Consultants:   orthopedics   Procedures: left hip hemiarthroplasty 7/10 by Dr. Marchia Bond  Antimicrobials:    Subjective: -feels a little weak today, no chest pain, no nausea vomiting  Objective: Vitals:   01/22/18 1440 01/22/18 2030 01/23/18 0619 01/23/18 1030  BP: (!) 120/47 (!) 137/55 (!) 161/63 (!) 130/57  Pulse: 66 80 83 81  Resp: 16 16 16 16   Temp: 98.7 F (37.1 C) 99.2 F (37.3 C) 99 F (37.2 C) 99 F (37.2 C)  TempSrc: Oral  Oral Oral Oral  SpO2: 96% 92% 94% 96%  Weight:      Height:        Intake/Output Summary (Last 24 hours) at 01/23/2018 1249 Last data filed at 01/23/2018 0900 Gross per 24 hour  Intake 600 ml  Output -  Net 600 ml   Filed Weights   01/21/18 1637  Weight: 48.8 kg (107 lb 9.4 oz)    Examination:  Gen: Awake, Alert, Oriented X 3,  HEENT: PERRLA, Neck supple, no JVD Lungs: Good air movement bilaterally, CTAB CVS: RRR,No Gallops,Rubs or new Murmurs Abd: soft, Non tender, non distended, BS present Extremities: No edema, left hip surgical site is unremarkable Skin: no new rashes  Data Reviewed:   CBC: Recent Labs  Lab 01/21/18 1323 01/22/18 0337 01/23/18 0526  WBC 9.6 11.0* 11.6*  NEUTROABS 6.1  --   --   HGB 10.8* 8.0* 7.2*  HCT 32.8* 24.9* 22.1*  MCV 94.3 94.7 95.3  PLT 204 173 614   Basic Metabolic Panel: Recent Labs  Lab 01/21/18 1323 01/22/18 0337 01/23/18 0526  NA 138 135 136  K 4.7 5.2* 3.9  CL 106 100 100  CO2 23 24 24   GLUCOSE 164* 214* 207*  BUN 18 25* 25*  CREATININE 1.02* 1.32* 1.12*  CALCIUM 9.7 8.5* 8.3*   GFR: Estimated Creatinine Clearance: 29.7 mL/min (A) (by C-G formula based on SCr of 1.12 mg/dL (H)). Liver Function Tests: No results for input(s): AST, ALT, ALKPHOS, BILITOT, PROT, ALBUMIN in the last 168 hours. No results for input(s): LIPASE, AMYLASE in the last 168 hours. No results for input(s): AMMONIA in the  last 168 hours. Coagulation Profile: Recent Labs  Lab 01/21/18 1323  INR 1.09   Cardiac Enzymes: Recent Labs  Lab 01/22/18 0337  CKTOTAL 234   BNP (last 3 results) No results for input(s): PROBNP in the last 8760 hours. HbA1C: No results for input(s): HGBA1C in the last 72 hours. CBG: Recent Labs  Lab 01/21/18 2230 01/22/18 0619 01/22/18 2136 01/23/18 0649 01/23/18 1147  GLUCAP 223* 178* 220* 205* 280*   Lipid Profile: No results for input(s): CHOL, HDL, LDLCALC, TRIG, CHOLHDL, LDLDIRECT in the last 72  hours. Thyroid Function Tests: No results for input(s): TSH, T4TOTAL, FREET4, T3FREE, THYROIDAB in the last 72 hours. Anemia Panel: No results for input(s): VITAMINB12, FOLATE, FERRITIN, TIBC, IRON, RETICCTPCT in the last 72 hours. Urine analysis: No results found for: COLORURINE, APPEARANCEUR, LABSPEC, PHURINE, GLUCOSEU, HGBUR, BILIRUBINUR, KETONESUR, PROTEINUR, UROBILINOGEN, NITRITE, LEUKOCYTESUR Sepsis Labs: @LABRCNTIP (procalcitonin:4,lacticidven:4)  )No results found for this or any previous visit (from the past 240 hour(s)).       Radiology Studies: Dg Chest 1 View  Result Date: 01/21/2018 CLINICAL DATA:  Patient fell today and is complaining of left hip pain. Nonsmoker. History of coronary artery disease and diabetes. No current chest complaints. EXAM: CHEST  1 VIEW COMPARISON:  PA and lateral chest x-ray of November 11, 2017. FINDINGS: The lungs are adequately inflated and clear. The heart and pulmonary vascularity are normal. The mediastinum is normal in width. There is calcification in the wall of the aortic arch. There is faintly calcified nodularity within the left breast. There is no pleural effusion. The bony thorax exhibits no acute abnormality. IMPRESSION: There is no acute cardiopulmonary abnormality. Thoracic aortic atherosclerosis. Electronically Signed   By: David  Martinique M.D.   On: 01/21/2018 13:52   Ct Head Wo Contrast  Result Date: 01/21/2018 CLINICAL DATA:  [place] today striking head on concrete, high clinical risk of cervical spine trauma, history hypertension, type II diabetes mellitus, coronary artery disease EXAM: CT HEAD WITHOUT CONTRAST CT CERVICAL SPINE WITHOUT CONTRAST TECHNIQUE: Multidetector CT imaging of the head and cervical spine was performed following the standard protocol without intravenous contrast. Multiplanar CT image reconstructions of the cervical spine were also generated. COMPARISON:  CT head 03/28/2017 FINDINGS: CT HEAD FINDINGS Brain: Generalized  atrophy. Normal ventricular morphology. No midline shift or mass effect. Small vessel chronic ischemic changes of deep cerebral white matter. No intracranial hemorrhage, mass lesion, evidence of acute infarction, or extra-axial fluid collection. Vascular: Atherosclerotic calcification of internal carotid arteries at skull base Skull: Intact Sinuses/Orbits: Visualized paranasal sinuses and mastoid air cells clear Other: N/A CT CERVICAL SPINE FINDINGS Alignment: Normal Skull base and vertebrae: Visualized skull base intact. Osseous mineralization normal. T by heights maintained without fracture, subluxation, or bone destruction. Scattered facet degenerative changes. Soft tissues and spinal canal: Prevertebral soft tissues normal thickness. 9 mm nonspecific RIGHT thyroid nodule. Cervical soft tissues otherwise unremarkable. Disc levels: Multilevel disc space narrowing and endplate spur formation greatest at C5-C6 and C6-C7. Uncovertebral spurs encroach upon cervical neural foramina bilaterally at C5-C6 and C6-C7. Bulging discs noted at C5-C6 and C6-C7. Upper chest: Minimal biapical scarring. Other: Atherosclerotic calcifications at the carotid bifurcations bilaterally. IMPRESSION: Atrophy with small vessel chronic ischemic changes of deep cerebral white matter. No acute intracranial abnormalities. Degenerative disc and facet disease changes of the cervical spine. No acute cervical spine abnormalities. Electronically Signed   By: Lavonia Dana M.D.   On: 01/21/2018 14:16   Ct Cervical Spine Wo Contrast  Result Date: 01/21/2018 CLINICAL DATA:  [place] today striking head on concrete, high clinical risk of cervical spine trauma, history hypertension, type II diabetes mellitus, coronary artery disease EXAM: CT HEAD WITHOUT CONTRAST CT CERVICAL SPINE WITHOUT CONTRAST TECHNIQUE: Multidetector CT imaging of the head and cervical spine was performed following the standard protocol without intravenous contrast. Multiplanar CT  image reconstructions of the cervical spine were also generated. COMPARISON:  CT head 03/28/2017 FINDINGS: CT HEAD FINDINGS Brain: Generalized atrophy. Normal ventricular morphology. No midline shift or mass effect. Small vessel chronic ischemic changes of deep cerebral white matter. No intracranial hemorrhage, mass lesion, evidence of acute infarction, or extra-axial fluid collection. Vascular: Atherosclerotic calcification of internal carotid arteries at skull base Skull: Intact Sinuses/Orbits: Visualized paranasal sinuses and mastoid air cells clear Other: N/A CT CERVICAL SPINE FINDINGS Alignment: Normal Skull base and vertebrae: Visualized skull base intact. Osseous mineralization normal. T by heights maintained without fracture, subluxation, or bone destruction. Scattered facet degenerative changes. Soft tissues and spinal canal: Prevertebral soft tissues normal thickness. 9 mm nonspecific RIGHT thyroid nodule. Cervical soft tissues otherwise unremarkable. Disc levels: Multilevel disc space narrowing and endplate spur formation greatest at C5-C6 and C6-C7. Uncovertebral spurs encroach upon cervical neural foramina bilaterally at C5-C6 and C6-C7. Bulging discs noted at C5-C6 and C6-C7. Upper chest: Minimal biapical scarring. Other: Atherosclerotic calcifications at the carotid bifurcations bilaterally. IMPRESSION: Atrophy with small vessel chronic ischemic changes of deep cerebral white matter. No acute intracranial abnormalities. Degenerative disc and facet disease changes of the cervical spine. No acute cervical spine abnormalities. Electronically Signed   By: Lavonia Dana M.D.   On: 01/21/2018 14:16   Dg Hip Port Unilat With Pelvis 1v Left  Result Date: 01/21/2018 CLINICAL DATA:  Hemiarthroplasty EXAM: DG HIP (WITH OR WITHOUT PELVIS) 1V PORT LEFT COMPARISON:  Earlier today FINDINGS: Left hip hemiarthroplasty is located. Negative for periprosthetic fracture. IMPRESSION: No acute finding after left hip  arthroplasty. Electronically Signed   By: Monte Fantasia M.D.   On: 01/21/2018 20:48   Dg Hip Unilat With Pelvis 2-3 Views Left  Result Date: 01/21/2018 CLINICAL DATA:  The patient fell today and is complaining of left hip pain. EXAM: DG HIP (WITH OR WITHOUT PELVIS) 2-3V LEFT COMPARISON:  No recent studies in [facility] FINDINGS: The patient has sustained an acute subcapital fracture of the left hip. There is mild superior migration of the proximal femur with respect to the mildly rotated femoral head. The left acetabulum appears intact. No acute abnormality of the bony pelvis is observed. IMPRESSION: Acute subcapital fracture of the left hip. Electronically Signed   By: David  Martinique M.D.   On: 01/21/2018 13:54        Scheduled Meds: . apixaban  2.5 mg Oral BID  . atorvastatin  20 mg Oral q1800  . docusate sodium  100 mg Oral BID  . famotidine  20 mg Oral Daily  . ferrous sulfate  325 mg Oral TID PC  . furosemide  20 mg Intravenous Once  . insulin aspart  0-5 Units Subcutaneous QHS  . insulin aspart  0-9 Units Subcutaneous TID WC  . metoprolol succinate  100 mg Oral Daily  . PARoxetine  40 mg Oral Daily   Continuous Infusions:   LOS: 2 days    Time spent: 32min    Domenic Polite, MD [facility] Page via [email or web address], password [email or web address] After 7PM please contact night-coverage  01/23/2018, 12:49 PM

## 2018-01-23 NOTE — Progress Notes (Addendum)
Patient ID: Stephanie [name], female   DOB: 05-01-1934, 84 y.o.   MRN: [id number]     Subjective:  Patient reports pain as mild.  Patient wants to go home and in no acute distress  Objective:   VITALS:   Vitals:   01/22/18 0424 01/22/18 1440 01/22/18 2030 01/23/18 0619  BP: (!) 106/50 (!) 120/47 (!) 137/55 (!) 161/63  Pulse: (!) 57 66 80 83  Resp:  16 16 16   Temp: 97.7 F (36.5 C) 98.7 F (37.1 C) 99.2 F (37.3 C) 99 F (37.2 C)  TempSrc: Oral Oral Oral Oral  SpO2: 100% 96% 92% 94%  Weight:      Height:        ABD soft Sensation intact distally Dorsiflexion/Plantar flexion intact Incision: dressing C/D/I and scant drainage   Lab Results  Component Value Date   WBC 11.6 (H) 01/23/2018   HGB 7.2 (L) 01/23/2018   HCT 22.1 (L) 01/23/2018   MCV 95.3 01/23/2018   PLT 165 01/23/2018   BMET    Component Value Date/Time   NA 136 01/23/2018 0526   K 3.9 01/23/2018 0526   CL 100 01/23/2018 0526   CO2 24 01/23/2018 0526   GLUCOSE 207 (H) 01/23/2018 0526   BUN 25 (H) 01/23/2018 0526   CREATININE 1.12 (H) 01/23/2018 0526   CREATININE 0.88 11/27/2010 0916   CALCIUM 8.3 (L) 01/23/2018 0526   GFRNONAA 45 (L) 01/23/2018 0526   GFRAA 52 (L) 01/23/2018 0526     Assessment/Plan: 2 Days Post-Op   Principal Problem:   Subcapital fracture of left hip (HCC) Active Problems:   Type 2 diabetes, uncontrolled, with retinopathy (HCC)   Essential hypertension   Transient cerebral ischemia   Fall   Depression   HLD (hyperlipidemia)   GERD (gastroesophageal reflux disease)   CAD (coronary artery disease)   Chronic kidney disease (CKD), stage II (mild)   Advance diet Up with therapy Continue plan per medicine ABLA will transfuse 2 units WBAT Dry dressing PRN DC per medicine       DOUGLAS PARRY, BRANDON 01/23/2018, 8:01 AM  Discussed and agree with above.   Marchia Bond, MD Cell [phone]

## 2018-01-23 NOTE — Care Management Important Message (Signed)
Important Message  Patient Details  Name: Stephanie [name] MRN: [id number] Date of Birth: 1933-12-22   Medicare Important Message Given:  Yes    Rowan Blaker [place] [address] 01/23/2018, 3:40 PM

## 2018-01-23 NOTE — Progress Notes (Signed)
PT Cancellation Note  Patient Details Name: Stephanie [name] MRN: [id number] DOB: 09-09-1933   Cancelled Treatment:    Reason Eval/Treat Not Completed: Active bedrest order. Pt remains with bed rest orders in place. PT will continue to follow acutely and await updated activity orders.   [place] 01/23/2018, 7:55 AM

## 2018-01-24 DIAGNOSIS — S72002A Fracture of unspecified part of neck of left femur, initial encounter for closed fracture: Secondary | ICD-10-CM

## 2018-01-24 LAB — BASIC METABOLIC PANEL
Anion gap: 10 (ref 5–15)
BUN: 19 mg/dL (ref 8–23)
CO2: 25 mmol/L (ref 22–32)
CREATININE: 1.02 mg/dL — AB (ref 0.44–1.00)
Calcium: 8.8 mg/dL — ABNORMAL LOW (ref 8.9–10.3)
Chloride: 101 mmol/L (ref 98–111)
GFR calc Af Amer: 58 mL/min — ABNORMAL LOW (ref >60)
GFR, EST NON AFRICAN AMERICAN: 50 mL/min — AB (ref >60)
Glucose, Bld: 236 mg/dL — ABNORMAL HIGH (ref 70–99)
Potassium: 3.9 mmol/L (ref 3.5–5.1)
SODIUM: 136 mmol/L (ref 135–145)

## 2018-01-24 LAB — BPAM RBC
BLOOD PRODUCT EXPIRATION DATE: 201907132359
Blood Product Expiration Date: 201907132359
ISSUE DATE / TIME: 201907121426
ISSUE DATE / TIME: 201907121017
UNIT TYPE AND RH: 600
UNIT TYPE AND RH: 600

## 2018-01-24 LAB — TYPE AND SCREEN
ABO/RH(D): A NEG
Antibody Screen: NEGATIVE
UNIT DIVISION: 0
Unit division: 0

## 2018-01-24 LAB — GLUCOSE, CAPILLARY
GLUCOSE-CAPILLARY: 394 mg/dL — AB (ref 70–99)
Glucose-Capillary: 274 mg/dL — ABNORMAL HIGH (ref 70–99)

## 2018-01-24 LAB — CBC
HCT: 34.7 % — ABNORMAL LOW (ref 36.0–46.0)
Hemoglobin: 11.8 g/dL — ABNORMAL LOW (ref 12.0–15.0)
MCH: 30.4 pg (ref 26.0–34.0)
MCHC: 34 g/dL (ref 30.0–36.0)
MCV: 89.4 fL (ref 78.0–100.0)
PLATELETS: 167 10*3/uL (ref 150–400)
RBC: 3.88 MIL/uL (ref 3.87–5.11)
RDW: 14.7 % (ref 11.5–15.5)
WBC: 15 10*3/uL — ABNORMAL HIGH (ref 4.0–10.5)

## 2018-01-24 MED ORDER — APIXABAN 2.5 MG PO TABS
2.5000 mg | ORAL_TABLET | Freq: Two times a day (BID) | ORAL | 0 refills | Status: DC
Start: 2018-01-24 — End: 2018-06-04

## 2018-01-24 MED ORDER — HYDROCODONE-ACETAMINOPHEN 7.5-325 MG PO TABS: 1.0000 | ORAL_TABLET | Freq: Four times a day (QID) | ORAL | 0 refills | Status: AC | PRN

## 2018-01-24 MED ORDER — POLYETHYLENE GLYCOL 3350 17 G PO PACK: 17.0000 g | PACK | Freq: Every day | ORAL | 0 refills | Status: DC

## 2018-01-24 MED ORDER — ASPIRIN-DIPYRIDAMOLE ER 25-200 MG PO CP12: 1.0000 | ORAL_CAPSULE | Freq: Two times a day (BID) | ORAL | Status: DC

## 2018-01-24 NOTE — Care Management Note (Signed)
Case Management Note  Patient Details  Name: Stephanie [name] MRN: [id number] Date of Birth: 01/30/1934  Subjective/Objective:       Pt presented for left hip fracture.  Pt from home with husband and usually uses cane for ADLs.  She also has a regular walker and a rollator but no rolling walker.  Pt chooses [facility] to provide therapy services.            Action/Plan: DME RW ordered from [facility] and will be delivered to room prior to d/c.  [facility] referral called to [facility] with [facility].  [place] set for 01/27/18.  Expected Discharge Date:  01/24/18               Expected Discharge Plan:  [place]  In-House Referral:  NA  Discharge planning Services  CM Consult  Post Acute Care Choice:  Durable Medical Equipment, Home Health Choice offered to:  Patient  DME Arranged:  Walker rolling DME Agency:  [facility]:  PT [facility] Agency:  [place]  Status of Service:  Completed, signed off  If discussed at [place] of Stay Meetings, dates discussed:    Additional Comments:  Claudie Leach, RN 01/24/2018, 12:00 PM

## 2018-01-24 NOTE — Progress Notes (Signed)
Discharge instructions completed with pt.  Pt verbalized understanding of the information.  Pt denies chest pain, shortness of breath, dizziness, lightheadedness, and n/v.  Pt waiting for walker to be delivered to be discharged home.

## 2018-01-24 NOTE — Social Work (Signed)
CSW aware pt has been cleared for d/c, CSW signing off. Please consult if any additional needs arise.   Alexander Mt, [place] Work [phone]

## 2018-01-24 NOTE — Progress Notes (Addendum)
Patient ID: Stephanie [name], female   DOB: 1933/11/30, 84 y.o.   MRN: [id number]     Subjective:  Patient reports pain as mild.  Patient waiting for DC denies any CP or SOB also no nausea  Objective:   VITALS:   Vitals:   01/23/18 1600 01/23/18 1830 01/23/18 2019 01/24/18 0444  BP:  (!) 116/91 (!) 165/59 (!) 160/69  Pulse:  80 79 (!) 51  Resp:  16  17  Temp: 99.5 F (37.5 C) 98.5 F (36.9 C) 99.3 F (37.4 C) 98.4 F (36.9 C)  TempSrc: Oral Oral Oral Oral  SpO2:  98% 94% 98%  Weight:      Height:        ABD soft Sensation intact distally Dorsiflexion/Plantar flexion intact Incision: dressing C/D/I and no drainage   Lab Results  Component Value Date   WBC 15.0 (H) 01/24/2018   HGB 11.8 (L) 01/24/2018   HCT 34.7 (L) 01/24/2018   MCV 89.4 01/24/2018   PLT 167 01/24/2018   BMET    Component Value Date/Time   NA 136 01/24/2018 0346   K 3.9 01/24/2018 0346   CL 101 01/24/2018 0346   CO2 25 01/24/2018 0346   GLUCOSE 236 (H) 01/24/2018 0346   BUN 19 01/24/2018 0346   CREATININE 1.02 (H) 01/24/2018 0346   CREATININE 0.88 11/27/2010 0916   CALCIUM 8.8 (L) 01/24/2018 0346   GFRNONAA 50 (L) 01/24/2018 0346   GFRAA 58 (L) 01/24/2018 0346     Assessment/Plan: 3 Days Post-Op   Principal Problem:   Subcapital fracture of left hip (HCC) Active Problems:   Type 2 diabetes, uncontrolled, with retinopathy (HCC)   Essential hypertension   Transient cerebral ischemia   Fall   Depression   HLD (hyperlipidemia)   GERD (gastroesophageal reflux disease)   CAD (coronary artery disease)   Chronic kidney disease (CKD), stage II (mild)   Advance diet Up with therapy Continue plan per medicine WBAT Dry dressing PRN Follow up with Dr Mardelle Matte in 2 week      Remonia Richter 01/24/2018, 7:21 AM   Marchia Bond, MD Cell [phone]

## 2018-01-24 NOTE — Plan of Care (Signed)
Acute PT goals established. 

## 2018-01-24 NOTE — Evaluation (Signed)
lPhysical Therapy Evaluation Patient Details Name: Stephanie [name] MRN: [id number] DOB: [birth year] Today's Date: 01/24/2018   History of Present Illness  84 y.o female s/p Left hip hemiarthroplasty after sustaining mechanical fall. Reported history of multiple falls at home this year. Significant medical hx for hypertension, hyperlipidemia, diabetes mellitus, TIA, GERD, depression, PVD, CAD, stent placement, anemia, CKD-2, who presents with fall, left hip pain..  Clinical Impression   Patient is s/p above surgery resulting in functional limitations due to the deficits listed below (see PT Problem List). Able to ambulate slowly with a rolling walker up to 40 feet with good stability. Navigated single step similar to home entrance. Good family support from husband. Adequate for D/C from PT standpoint when medically ready. Will follow until d/c.       Follow Up Recommendations Home health PT;Supervision for mobility/OOB    Equipment Recommendations  Rolling walker with 5" wheels    Recommendations for Other Services       Precautions / Restrictions Precautions Precautions: Fall;Posterior Hip Precaution Booklet Issued: Yes (comment) Precaution Comments: reviewed Restrictions Weight Bearing Restrictions: Yes LLE Weight Bearing: Weight bearing as tolerated Other Position/Activity Restrictions: Posterior precautions of hip      Mobility  Bed Mobility Overal bed mobility: Needs Assistance Bed Mobility: Supine to Sit     Supine to sit: Supervision     General bed mobility comments: Supervision for safety, reinforced precautions, required extra time but no physical assist.  Transfers Overall transfer level: Needs assistance Equipment used: Rolling walker (2 wheeled) Transfers: Sit to/from Stand Sit to Stand: Min guard         General transfer comment: Min guard for safety rising from bed, chair, and toilet. VC for hand placement and precautions. No physical assist required.  Slow and effortful.  Ambulation/Gait Ambulation/Gait assistance: Supervision Gait Distance (Feet): 45 Feet Assistive device: Rolling walker (2 wheeled) Gait Pattern/deviations: Step-to pattern;Decreased step length - right;Decreased stance time - left;Decreased stride length;Decreased weight shift to left;Narrow base of support;Antalgic Gait velocity: slow Gait velocity interpretation: <1.31 ft/sec, indicative of household ambulator General Gait Details: Educated on safe DME use with rolling walker. VC for foot placement and sequencing, slowly emerging step-through pattern. No buckling note. Slow but steady with RW for support.  Stairs Stairs: Yes Stairs assistance: Min assist Stair Management: No rails;Step to pattern;Backwards;With walker Number of Stairs: 1 General stair comments: Single step posterior approach, min assist to steady RW only. Reviewed sequencing and technique. Husband to assist at home.  Wheelchair Mobility    Modified Rankin (Stroke Patients Only)       Balance Overall balance assessment: Mild deficits observed, not formally tested                                           Pertinent Vitals/Pain Pain Assessment: 0-10 Pain Score: 0-No pain Pain Location: Lt hip Pain Descriptors / Indicators: Aching Pain Intervention(s): Monitored during session;Repositioned    Home Living Family/patient expects to be discharged to:: Private residence Living Arrangements: Spouse/significant other Available Help at Discharge: Family;Available 24 hours/day Type of Home: House Home Access: Stairs to enter Entrance Stairs-Rails: None Entrance Stairs-Number of Steps: 1 Home Layout: One level Home Equipment: Walker - 4 wheels;Walker - standard;Bedside commode;Cane - quad      Prior Function Level of Independence: Independent with assistive device(s)         Comments: Occasional  use of cane for mobility     Hand Dominance   Dominant Hand:  Left    Extremity/Trunk Assessment   Upper Extremity Assessment Upper Extremity Assessment: Defer to OT evaluation    Lower Extremity Assessment Lower Extremity Assessment: LLE deficits/detail LLE Deficits / Details: Gross weakness and muscle guarding LT hip as expected post op       Communication   Communication: No difficulties  Cognition Arousal/Alertness: Awake/alert Behavior During Therapy: WFL for tasks assessed/performed Overall Cognitive Status: Within Functional Limits for tasks assessed                                        General Comments General comments (skin integrity, edema, etc.): Ice reapplied to lt hip    Exercises Total Joint Exercises Ankle Circles/Pumps: AROM;Both;10 reps;Supine Quad Sets: Strengthening;Both;10 reps;Supine Short Arc Quad: Strengthening;Left;10 reps;Supine Heel Slides: Strengthening;Left;5 reps;Supine Hip ABduction/ADduction: Strengthening;Left;5 reps;Supine Long Arc Quad: Strengthening;Left;5 reps;Seated   Assessment/Plan    PT Assessment Patient needs continued PT services  PT Problem List Decreased strength;Decreased range of motion;Decreased activity tolerance;Decreased balance;Decreased mobility;Decreased knowledge of use of DME;Decreased knowledge of precautions;Pain       PT Treatment Interventions DME instruction;Gait training;Stair training;Functional mobility training;Therapeutic activities;Therapeutic exercise;Balance training;Neuromuscular re-education;Patient/family education    PT Goals (Current goals can be found in the Care Plan section)  Acute Rehab PT Goals Patient Stated Goal: Go home today PT Goal Formulation: With patient Time For Goal Achievement: 01/31/18 Potential to Achieve Goals: Good    Frequency Min 3X/week   Barriers to discharge        Co-evaluation               AM-PAC PT "6 Clicks" Daily Activity  Outcome Measure Difficulty turning over in bed (including adjusting  bedclothes, sheets and blankets)?: A Little Difficulty moving from lying on back to sitting on the side of the bed? : A Little Difficulty sitting down on and standing up from a chair with arms (e.g., wheelchair, bedside commode, etc,.)?: A Little Help needed moving to and from a bed to chair (including a wheelchair)?: None Help needed walking in hospital room?: None Help needed climbing 3-5 steps with a railing? : A Little 6 Click Score: 20    End of Session Equipment Utilized During Treatment: Gait belt Activity Tolerance: Patient tolerated treatment well Patient left: in chair;with call bell/phone within reach;with chair alarm set;with SCD's reapplied   PT Visit Diagnosis: History of falling (Z91.81);Unsteadiness on feet (R26.81);Difficulty in walking, not elsewhere classified (R26.2);Pain Pain - Right/Left: Left Pain - part of body: Hip    Time: 1655-3748 PT Time Calculation (min) (ACUTE ONLY): 58 min   Charges:   PT Evaluation $PT Eval Moderate Complexity: 1 Mod PT Treatments $Gait Training: 8-22 mins $Therapeutic Exercise: 8-22 mins $Therapeutic Activity: 8-22 mins   PT G Codes:        Elayne Snare, PT, DPT   Ellouise Newer 01/24/2018, 9:45 AM

## 2018-01-26 DIAGNOSIS — E1151 Type 2 diabetes mellitus with diabetic peripheral angiopathy without gangrene: Secondary | ICD-10-CM | POA: Diagnosis not present

## 2018-01-26 DIAGNOSIS — N182 Chronic kidney disease, stage 2 (mild): Secondary | ICD-10-CM | POA: Diagnosis not present

## 2018-01-26 DIAGNOSIS — I129 Hypertensive chronic kidney disease with stage 1 through stage 4 chronic kidney disease, or unspecified chronic kidney disease: Secondary | ICD-10-CM | POA: Diagnosis not present

## 2018-01-26 DIAGNOSIS — E785 Hyperlipidemia, unspecified: Secondary | ICD-10-CM | POA: Diagnosis not present

## 2018-01-26 DIAGNOSIS — E559 Vitamin D deficiency, unspecified: Secondary | ICD-10-CM | POA: Diagnosis not present

## 2018-01-26 DIAGNOSIS — M1991 Primary osteoarthritis, unspecified site: Secondary | ICD-10-CM | POA: Diagnosis not present

## 2018-01-26 DIAGNOSIS — E1169 Type 2 diabetes mellitus with other specified complication: Secondary | ICD-10-CM | POA: Diagnosis not present

## 2018-01-26 DIAGNOSIS — S72012D Unspecified intracapsular fracture of left femur, subsequent encounter for closed fracture with routine healing: Secondary | ICD-10-CM | POA: Diagnosis not present

## 2018-01-26 DIAGNOSIS — E11319 Type 2 diabetes mellitus with unspecified diabetic retinopathy without macular edema: Secondary | ICD-10-CM | POA: Diagnosis not present

## 2018-01-26 DIAGNOSIS — I2510 Atherosclerotic heart disease of native coronary artery without angina pectoris: Secondary | ICD-10-CM | POA: Diagnosis not present

## 2018-01-26 DIAGNOSIS — E1122 Type 2 diabetes mellitus with diabetic chronic kidney disease: Secondary | ICD-10-CM | POA: Diagnosis not present

## 2018-01-27 ENCOUNTER — Encounter: Payer: Self-pay | Admitting: Family Medicine

## 2018-01-27 ENCOUNTER — Ambulatory Visit (INDEPENDENT_AMBULATORY_CARE_PROVIDER_SITE_OTHER): Payer: Medicare Other | Admitting: Family Medicine

## 2018-01-27 VITALS — BP 138/72 | HR 70 | Temp 97.7°F | Ht 61.0 in | Wt 108.5 lb

## 2018-01-27 DIAGNOSIS — E875 Hyperkalemia: Secondary | ICD-10-CM

## 2018-01-27 DIAGNOSIS — N182 Chronic kidney disease, stage 2 (mild): Secondary | ICD-10-CM | POA: Diagnosis not present

## 2018-01-27 DIAGNOSIS — W19XXXA Unspecified fall, initial encounter: Secondary | ICD-10-CM

## 2018-01-27 DIAGNOSIS — M8580 Other specified disorders of bone density and structure, unspecified site: Secondary | ICD-10-CM

## 2018-01-27 DIAGNOSIS — I10 Essential (primary) hypertension: Secondary | ICD-10-CM

## 2018-01-27 DIAGNOSIS — S72012D Unspecified intracapsular fracture of left femur, subsequent encounter for closed fracture with routine healing: Secondary | ICD-10-CM

## 2018-01-27 DIAGNOSIS — S72002S Fracture of unspecified part of neck of left femur, sequela: Secondary | ICD-10-CM

## 2018-01-27 NOTE — Progress Notes (Signed)
Subjective:    Patient ID: Stephanie [name], female    DOB: 1933-11-04, 84 y.o.   MRN: [id number]  HPI  Here for f/u of hospitalization for L hip fracture and hemiarthroplasty (on 7/10) Fell outside sweeping patio - caught foot on uneven concrete  hosp from 7/10 to 7/13  Has HH- was d/c to home  PT came yesterday    Had anemia-post surgical Transfusion  Started on low dose eliquis  for DVT prophylaxis   Lab Results  Component Value Date   WBC 15.0 (H) 01/24/2018   HGB 11.8 (L) 01/24/2018   HCT 34.7 (L) 01/24/2018   MCV 89.4 01/24/2018   PLT 167 01/24/2018    AKI on AKD2 with hyperkalemia  Diuretic held and improved  IV fluids Lab Results  Component Value Date   CREATININE 1.02 (H) 01/24/2018   BUN 19 01/24/2018   NA 136 01/24/2018   K 3.9 01/24/2018   CL 101 01/24/2018   CO2 25 01/24/2018   Will re start aggrenox once she is done with anticoag   DM stable Lab Results  Component Value Date   HGBA1C 6.3 08/27/2017   On SSI and glipizide / metformin held (back on it now)  Blood sugars were high in the hospital and now coming back to normal  Appetite slowly coming back    Has f/u with Dr Mardelle Matte 7/24   This started with fall on steps= fx hip and hit head  CT of head nl  CT of [place] showed deg disc dz   Dg Chest 1 View  Result Date: 01/21/2018 CLINICAL DATA:  Patient fell today and is complaining of left hip pain. Nonsmoker. History of coronary artery disease and diabetes. No current chest complaints. EXAM: CHEST  1 VIEW COMPARISON:  PA and lateral chest x-ray of November 11, 2017. FINDINGS: The lungs are adequately inflated and clear. The heart and pulmonary vascularity are normal. The mediastinum is normal in width. There is calcification in the wall of the aortic arch. There is faintly calcified nodularity within the left breast. There is no pleural effusion. The bony thorax exhibits no acute abnormality. IMPRESSION: There is no acute cardiopulmonary abnormality.  Thoracic aortic atherosclerosis. Electronically Signed   By: David  Martinique M.D.   On: 01/21/2018 13:52   Ct Head Wo Contrast  Result Date: 01/21/2018 CLINICAL DATA:  [place] today striking head on concrete, high clinical risk of cervical spine trauma, history hypertension, type II diabetes mellitus, coronary artery disease EXAM: CT HEAD WITHOUT CONTRAST CT CERVICAL SPINE WITHOUT CONTRAST TECHNIQUE: Multidetector CT imaging of the head and cervical spine was performed following the standard protocol without intravenous contrast. Multiplanar CT image reconstructions of the cervical spine were also generated. COMPARISON:  CT head 03/28/2017 FINDINGS: CT HEAD FINDINGS Brain: Generalized atrophy. Normal ventricular morphology. No midline shift or mass effect. Small vessel chronic ischemic changes of deep cerebral white matter. No intracranial hemorrhage, mass lesion, evidence of acute infarction, or extra-axial fluid collection. Vascular: Atherosclerotic calcification of internal carotid arteries at skull base Skull: Intact Sinuses/Orbits: Visualized paranasal sinuses and mastoid air cells clear Other: N/A CT CERVICAL SPINE FINDINGS Alignment: Normal Skull base and vertebrae: Visualized skull base intact. Osseous mineralization normal. T by heights maintained without fracture, subluxation, or bone destruction. Scattered facet degenerative changes. Soft tissues and spinal canal: Prevertebral soft tissues normal thickness. 9 mm nonspecific RIGHT thyroid nodule. Cervical soft tissues otherwise unremarkable. Disc levels: Multilevel disc space narrowing and endplate spur formation greatest at C5-C6  and C6-C7. Uncovertebral spurs encroach upon cervical neural foramina bilaterally at C5-C6 and C6-C7. Bulging discs noted at C5-C6 and C6-C7. Upper chest: Minimal biapical scarring. Other: Atherosclerotic calcifications at the carotid bifurcations bilaterally. IMPRESSION: Atrophy with small vessel chronic ischemic changes of deep  cerebral white matter. No acute intracranial abnormalities. Degenerative disc and facet disease changes of the cervical spine. No acute cervical spine abnormalities. Electronically Signed   By: Lavonia Dana M.D.   On: 01/21/2018 14:16   Ct Cervical Spine Wo Contrast  Result Date: 01/21/2018 CLINICAL DATA:  [place] today striking head on concrete, high clinical risk of cervical spine trauma, history hypertension, type II diabetes mellitus, coronary artery disease EXAM: CT HEAD WITHOUT CONTRAST CT CERVICAL SPINE WITHOUT CONTRAST TECHNIQUE: Multidetector CT imaging of the head and cervical spine was performed following the standard protocol without intravenous contrast. Multiplanar CT image reconstructions of the cervical spine were also generated. COMPARISON:  CT head 03/28/2017 FINDINGS: CT HEAD FINDINGS Brain: Generalized atrophy. Normal ventricular morphology. No midline shift or mass effect. Small vessel chronic ischemic changes of deep cerebral white matter. No intracranial hemorrhage, mass lesion, evidence of acute infarction, or extra-axial fluid collection. Vascular: Atherosclerotic calcification of internal carotid arteries at skull base Skull: Intact Sinuses/Orbits: Visualized paranasal sinuses and mastoid air cells clear Other: N/A CT CERVICAL SPINE FINDINGS Alignment: Normal Skull base and vertebrae: Visualized skull base intact. Osseous mineralization normal. T by heights maintained without fracture, subluxation, or bone destruction. Scattered facet degenerative changes. Soft tissues and spinal canal: Prevertebral soft tissues normal thickness. 9 mm nonspecific RIGHT thyroid nodule. Cervical soft tissues otherwise unremarkable. Disc levels: Multilevel disc space narrowing and endplate spur formation greatest at C5-C6 and C6-C7. Uncovertebral spurs encroach upon cervical neural foramina bilaterally at C5-C6 and C6-C7. Bulging discs noted at C5-C6 and C6-C7. Upper chest: Minimal biapical scarring. Other:  Atherosclerotic calcifications at the carotid bifurcations bilaterally. IMPRESSION: Atrophy with small vessel chronic ischemic changes of deep cerebral white matter. No acute intracranial abnormalities. Degenerative disc and facet disease changes of the cervical spine. No acute cervical spine abnormalities. Electronically Signed   By: Lavonia Dana M.D.   On: 01/21/2018 14:16   Dg Hip Port Unilat With Pelvis 1v Left  Result Date: 01/21/2018 CLINICAL DATA:  Hemiarthroplasty EXAM: DG HIP (WITH OR WITHOUT PELVIS) 1V PORT LEFT COMPARISON:  Earlier today FINDINGS: Left hip hemiarthroplasty is located. Negative for periprosthetic fracture. IMPRESSION: No acute finding after left hip arthroplasty. Electronically Signed   By: Monte Fantasia M.D.   On: 01/21/2018 20:48   Dg Hip Unilat With Pelvis 2-3 Views Left  Result Date: 01/21/2018 CLINICAL DATA:  The patient fell today and is complaining of left hip pain. EXAM: DG HIP (WITH OR WITHOUT PELVIS) 2-3V LEFT COMPARISON:  No recent studies in [facility] FINDINGS: The patient has sustained an acute subcapital fracture of the left hip. There is mild superior migration of the proximal femur with respect to the mildly rotated femoral head. The left acetabulum appears intact. No acute abnormality of the bony pelvis is observed. IMPRESSION: Acute subcapital fracture of the left hip. Electronically Signed   By: David  Martinique M.D.   On: 01/21/2018 13:54      Wt Readings from Last 3 Encounters:  01/27/18 108 lb 8 oz (49.2 kg)  01/21/18 107 lb 9.4 oz (48.8 kg)  11/11/17 107 lb 8 oz (48.8 kg)   20.50 kg/m   BP Readings from Last 3 Encounters:  01/27/18 138/72  01/24/18 (!) 150/63  11/11/17  116/68   Better controlled bp now  Pulse Readings from Last 3 Encounters:  01/27/18 70  01/24/18 74  11/11/17 65    It is time to take off her dressing and replace it   Had one headache one am-not bad Better now  Not sleeping well  Took one hydrocodone for pain - did not  work well - just taking tylenol now=not too bad   Patient Active Problem List   Diagnosis Date Noted  . Closed fracture of left hip ([place])   . Chronic kidney disease (CKD), stage II (mild) 01/22/2018  . Fracture of femoral neck, left ([place]) 01/21/2018  . Fall 01/21/2018  . Depression 01/21/2018  . HLD (hyperlipidemia) 01/21/2018  . GERD (gastroesophageal reflux disease) 01/21/2018  . CAD (coronary artery disease) 01/21/2018  . Subcapital fracture of left hip ([place]) 01/21/2018  . Rash and nonspecific skin eruption 03/28/2017  . Carotid stenosis, bilateral 11/20/2016  . Transient cerebral ischemia 11/06/2016  . Vitamin D deficiency 09/04/2015  . Routine general medical examination at a health care facility 09/04/2015  . Encounter for Medicare annual wellness exam 05/30/2014  . Colon cancer screening 05/30/2014  . History of fall 05/26/2013  . Gynecological examination 02/20/2011  . HELICOBACTER PYLORI INFECTION 12/19/2006  . HSV 12/19/2006  . Type 2 diabetes, uncontrolled, with retinopathy ([place]) 12/19/2006  . Diabetic retinopathy ([place]) 12/19/2006  . Combined hyperlipidemia associated with type 2 diabetes mellitus ([place]) 12/19/2006  . ANXIETY 12/19/2006  . Essential hypertension 12/19/2006  . Coronary atherosclerosis 12/19/2006  . Peripheral vascular disease due to secondary diabetes mellitus ([place]) 12/19/2006  . ALLERGIC RHINITIS 12/19/2006  . OSTEOARTHRITIS 12/19/2006  . Osteopenia 12/19/2006   Past Medical History:  Diagnosis Date  . Allergy    allergic rhinitis  . Anxiety   . Arthritis    osteoarthritis  . Degenerative disk disease    in neck  . Diabetes mellitus    type II  . Fracture of femoral neck, left ([place]) 01/21/2018  . Hyperlipidemia   . Hypertension   . Hypoaldosteronism ([place]) 6/12   from DM causing hyperkalemia   . Osteopenia   . Periodontal disease   . Peripheral vascular disease ([place])   . Transient cerebral ischemia 11/06/2016   Past Surgical History:    Procedure Laterality Date  . CARDIAC CATHETERIZATION     x2 STENT  . CHOLECYSTECTOMY    . ESOPHAGOGASTRODUODENOSCOPY  03/2004   gastritis by biopsy  . GUM SURGERY  06/2010   laser surgery for gums  . HIP ARTHROPLASTY Left 01/21/2018   Procedure: ARTHROPLASTY BIPOLAR HIP (HEMIARTHROPLASTY);  Surgeon: Marchia Bond, MD;  Location: [facility];  Service: Orthopedics;  Laterality: Left;  Marland Kitchen MM BREAST STEREO BX*L*R/S  12/12   normal  . TONSILLECTOMY    . TRANSTHORACIC ECHOCARDIOGRAM  11/2016   EF 55-60%, grade 1 DD with elevated LV end diastolic filling pressures, mildly dilated LA, mild TR and PR.   . TUBAL LIGATION     Social History   Tobacco Use  . Smoking status: Never Smoker  . Smokeless tobacco: Never Used  Substance Use Topics  . Alcohol use: No    Alcohol/week: 0.0 oz  . Drug use: No   Family History  Problem Relation Age of Onset  . Cancer Mother        vaginal CA in situ  . Hypertension Mother   . Heart disease Mother        CAD and MI  . Heart disease Father  CAD  . Diabetes Father   . Heart disease Son        congenital valve problem   Allergies  Allergen Reactions  . Ace Inhibitors Other (See Comments)    REACTION: generic, reaction not known  . Amlodipine Besy-Benazepril Hcl Other (See Comments)    REACTION: chest pain  . Clopidogrel Bisulfate Other (See Comments)    REACTION: GI  . Erythromycin Other (See Comments)    REACTION: GI upset   Current Outpatient Medications on File Prior to Visit  Medication Sig Dispense Refill  . acetaminophen (TYLENOL) 325 MG tablet Take 2 tablets (650 mg total) by mouth every 6 (six) hours as needed. 60 tablet 1  . amLODipine (NORVASC) 5 MG tablet Take 1 tablet (5 mg total) by mouth daily. 30 tablet 11  . apixaban (ELIQUIS) 2.5 MG TABS tablet Take 1 tablet (2.5 mg total) by mouth 2 (two) times daily. For 22month 60 tablet 0  . atorvastatin (LIPITOR) 20 MG tablet Take 1 tablet (20 mg total) by mouth daily. 90 tablet 3   . chlorthalidone (HYGROTON) 25 MG tablet Take 1 tablet (25 mg total) by mouth daily. 90 tablet 3  . diphenhydramine-acetaminophen (TYLENOL PM EXTRA STRENGTH) 25-500 MG TABS Take 1 tablet by mouth at bedtime as needed.      Derrill Memo ON 02/24/2018] dipyridamole-aspirin (AGGRENOX) 200-25 MG 12hr capsule Take 1 capsule by mouth 2 (two) times daily. RESTART THIS IN 30days when you come off ELIQUIS    . GLIPIZIDE XL 10 MG 24 hr tablet TAKE 1 TABLET BY MOUTH ONCE DAILY WITH BREAKFAST 90 tablet 1  . HYDROcodone-acetaminophen (NORCO) 7.5-325 MG tablet Take 1 tablet by mouth every 6 (six) hours as needed for up to 5 days for severe pain (pain score 7-10). 30 tablet 0  . metFORMIN (GLUCOPHAGE) 1000 MG tablet Take 1 tablet (1,000 mg total) by mouth 2 (two) times daily with a meal. 180 tablet 3  . metoprolol succinate (TOPROL-XL) 100 MG 24 hr tablet TAKE 1 TABLET BY MOUTH ONCE DAILY TAKE  WITH  OR  IMMEDIATELY  FOLLOWING  A  MEAL 90 tablet 3  . PARoxetine (PAXIL) 40 MG tablet Take 1 tablet (40 mg total) by mouth daily. 90 tablet 3  . polyethylene glycol (MIRALAX / GLYCOLAX) packet Take 17 g by mouth daily. Use home supply  0  . ranitidine (ZANTAC) 150 MG tablet Take 150 mg by mouth 2 (two) times daily.     No current facility-administered medications on file prior to visit.     Review of Systems  Constitutional: Positive for fatigue. Negative for activity change, appetite change, fever and unexpected weight change.  HENT: Negative for congestion, ear pain, rhinorrhea, sinus pressure and sore throat.   Eyes: Negative for pain, redness and visual disturbance.  Respiratory: Negative for cough, shortness of breath and wheezing.   Cardiovascular: Negative for chest pain and palpitations.  Gastrointestinal: Negative for abdominal pain, blood in stool, constipation and diarrhea.  Endocrine: Negative for polydipsia and polyuria.  Genitourinary: Negative for dysuria, frequency and urgency.  Musculoskeletal:  Negative for arthralgias, back pain and myalgias.       L hip pain post op-not bad  Skin: Negative for pallor and rash.  Allergic/Immunologic: Negative for environmental allergies.  Neurological: Negative for dizziness, tremors, syncope, facial asymmetry, numbness and headaches.       Getting stronger with PT  Hematological: Negative for adenopathy. Does not bruise/bleed easily.  Psychiatric/Behavioral: Negative for decreased concentration and dysphoric  mood. The patient is not nervous/anxious.        Objective:   Physical Exam  Constitutional: She appears well-developed and well-nourished. No distress.  Well appearing    HENT:  Head: Normocephalic and atraumatic.  Mouth/Throat: Oropharynx is clear and moist.  Eyes: Pupils are equal, round, and reactive to light. Conjunctivae and EOM are normal.  Neck: Normal range of motion. Neck supple. No JVD present. Carotid bruit is not present. No thyromegaly present.  Cardiovascular: Normal rate, regular rhythm, normal heart sounds and intact distal pulses. Exam reveals no gallop.  Pulmonary/Chest: Effort normal and breath sounds normal. No respiratory distress. She has no wheezes. She has no rales.  No crackles  Abdominal: Soft. Bowel sounds are normal. She exhibits no distension, no abdominal bruit and no mass. There is no tenderness.  Musculoskeletal: She exhibits no edema.  Limited rom of L hip  Walking with walker   Lymphadenopathy:    She has no cervical adenopathy.  Neurological: She is alert. She has normal reflexes. She displays normal reflexes. No cranial nerve deficit. Coordination normal.  Walking steadily with walker   Skin: Skin is warm and dry. No rash noted.  Intact and healing incision on L hip - with steri strips Some scabbing No d/c or bleeding  Re dressed with sterile pad and paper tape   Colloid dressing on buttocks intact=no ulcer or wound seen  Psychiatric: She has a normal mood and affect.           Assessment & Plan:   Problem List Items Addressed This Visit      Cardiovascular and Mediastinum   Essential hypertension    bp in fair control at this time  BP Readings from Last 1 Encounters:  01/27/18 138/72   No changes needed Most recent labs reviewed  Disc lifstyle change with low sodium diet and exercise  Improved from the hospital  Continue current medicines         Musculoskeletal and Integument   Closed fracture of left hip (HCC) - Primary    Doing well s/p hemiarthroplasty Changed dressing/ inc looks good/disc home care  Has colloid dressing on buttocks-I do not see ulcer or wound -suspect protective   F/u with Dr Mardelle Matte as planned  Continue PT at home Disc fall prev in detail -hope they can work on balance as well as strength Continue using walker with amb at all times       Osteopenia    Now with recent FN fx Will disc eval/tx at fu next mo       Subcapital fracture of left hip [facility])    S/p surgery Reviewed hospital records, lab results and studies in detail   F/u ortho as planned  Dressing changed today - sterile pad/paper tape         Genitourinary   Chronic kidney disease (CKD), stage II (mild)    Stable at hosp dc for hip fx Reviewed hospital records, lab results and studies in detail  Disc imp of fluid intake  Labs and f/u at end of the mo         Other   Fall    H/o multiple falls One with FN fx Working with PT now/using walker  Disc fall prev      RESOLVED: HYPERKALEMIA    Reviewed hospital records, lab results and studies in detail   Noted at admission- treated  Nl at d/c  Re check at f/u next mo

## 2018-01-27 NOTE — Assessment & Plan Note (Signed)
Stable at hosp dc for hip fx Reviewed hospital records, lab results and studies in detail  Disc imp of fluid intake  Labs and f/u at end of the mo

## 2018-01-27 NOTE — Assessment & Plan Note (Signed)
Reviewed hospital records, lab results and studies in detail   Noted at admission- treated  Nl at d/c  Re check at f/u next mo

## 2018-01-27 NOTE — Assessment & Plan Note (Signed)
H/o multiple falls One with FN fx Working with PT now/using walker  Disc fall prev

## 2018-01-27 NOTE — Assessment & Plan Note (Signed)
Now with recent FN fx Will disc eval/tx at fu next mo

## 2018-01-27 NOTE — Assessment & Plan Note (Signed)
S/p surgery Reviewed hospital records, lab results and studies in detail   F/u ortho as planned  Dressing changed today - sterile pad/paper tape

## 2018-01-27 NOTE — Assessment & Plan Note (Signed)
Doing well s/p hemiarthroplasty Changed dressing/ inc looks good/disc home care  Has colloid dressing on buttocks-I do not see ulcer or wound -suspect protective   F/u with Dr Mardelle Matte as planned  Continue PT at home Disc fall prev in detail -hope they can work on balance as well as strength Continue using walker with amb at all times

## 2018-01-27 NOTE — Assessment & Plan Note (Signed)
bp in fair control at this time  BP Readings from Last 1 Encounters:  01/27/18 138/72   No changes needed Most recent labs reviewed  Disc lifstyle change with low sodium diet and exercise  Improved from the hospital  Continue current medicines

## 2018-01-27 NOTE — Patient Instructions (Addendum)
Keep wound clean -soap and water  Call the surgeon office with any questions   Do your PT Have them work on balance with you as well  Continue medicines as prescribed   Blood pressure looks good   I'm glad you are doing better

## 2018-01-28 NOTE — Discharge Summary (Signed)
Physician Discharge Summary  Stephanie [name] [id number] DOB: February 18, 1934 DOA: 01/21/2018  PCP: Abner Greenspan, MD  Admit date: 01/21/2018 Discharge date: 01/24/2018  Time spent: 45 minutes  Recommendations for Outpatient Follow-up:  1. PCP in 1 week 2. Orthopedics Dr.Landau in 10days 3. Home health PT/OT 4. Please STOP Xarelto after 4 weeks and transition back to home regimen of Aggrenox   Discharge Diagnoses:  Principal Problem:   Subcapital fracture of left hip (HCC) Active Problems:   Type 2 diabetes, uncontrolled, with retinopathy ([place])   Essential hypertension   Transient cerebral ischemia   Fall   Depression   HLD (hyperlipidemia)   GERD (gastroesophageal reflux disease)   CAD (coronary artery disease)   Chronic kidney disease (CKD), stage II (mild)   Closed fracture of left hip (HCC)   Discharge Condition: improved  Diet recommendation: DM heart healthy  Filed Weights   01/21/18 1637  Weight: 48.8 kg (107 lb 9.4 oz)    History of present illness:  Brief Narrative:Stephanie [name] a 84 y.o.femalewith medical history significant ofhypertension, hyperlipidemia, diabetes mellitus, TIA, GERD, depression, PVD, CAD, stent placement, anemia, CKD-2, who presentsed with fall, left hip pain  Hospital Course:   Subcapital fracture of left hip ([place]) -following mechanical fall, Ortho consulted, status post left hip hemiarthroplasty 7/10 -Started on low-dose xarelto for DVT prophylaxis by orthopedics, continue this for 4 weeks and then STOP -PT OT consulted, home health PT/OT recommended, this was set up prior to discharge -FU with Dr.Landau in 10days  Acute blood loss anemia -Due to postoperative blood loss and hemodilution -transfused 2 units of PRBC per orthopedics -Hb improved and stable now   AKi on CKD2 with hyperkalemia  -Creatinine improved with hydration and holding diuretics post op   History of TIA -Continue Lipitor, plan to restart Aggrenox in 4  weeks once she is off Eliquis   history of CAD -Stable, continue metoprolol and Lipitor  Type 2 diabetes mellitus -Last hemoglobin A1c was 6.3 in February -resumed  Glipizide and metformin    Consultants:   orthopedics   Procedures: left hip hemiarthroplasty 7/10 by Dr. Marchia Bond     Discharge Exam: Vitals:   01/24/18 0900 01/24/18 1340  BP: (!) 168/82 (!) 150/63  Pulse: 77 74  Resp:  19  Temp:  98.9 F (37.2 C)  SpO2:  99%    General: AAOx3 Cardiovascular: S1S2/RRR Respiratory: CTAB  Discharge Instructions   Discharge Instructions    Diet - low sodium heart healthy   Complete by:  As directed    Diet Carb Modified   Complete by:  As directed    Increase activity slowly   Complete by:  As directed    Weight bearing as tolerated   Complete by:  As directed      Allergies as of 01/24/2018      Reactions   Ace Inhibitors Other (See Comments)   REACTION: generic, reaction not known   Amlodipine Besy-benazepril Hcl Other (See Comments)   REACTION: chest pain   Clopidogrel Bisulfate Other (See Comments)   REACTION: GI   Erythromycin Other (See Comments)   REACTION: GI upset      Medication List    STOP taking these medications   amoxicillin-clavulanate 875-125 MG tablet Commonly known as:  AUGMENTIN   glucose blood test strip Commonly known as:  FREESTYLE TEST STRIPS   guaiFENesin-codeine 100-10 MG/5ML syrup Commonly known as:  ROBITUSSIN AC     TAKE these medications  acetaminophen 325 MG tablet Commonly known as:  TYLENOL Take 2 tablets (650 mg total) by mouth every 6 (six) hours as needed.   amLODipine 5 MG tablet Commonly known as:  NORVASC Take 1 tablet (5 mg total) by mouth daily.   apixaban 2.5 MG Tabs tablet Commonly known as:  ELIQUIS Take 1 tablet (2.5 mg total) by mouth 2 (two) times daily. For 25month   atorvastatin 20 MG tablet Commonly known as:  LIPITOR Take 1 tablet (20 mg total) by mouth daily.    chlorthalidone 25 MG tablet Commonly known as:  HYGROTON Take 1 tablet (25 mg total) by mouth daily.   dipyridamole-aspirin 200-25 MG 12hr capsule Commonly known as:  AGGRENOX Take 1 capsule by mouth 2 (two) times daily. RESTART THIS IN 30days when you come off ELIQUIS Start taking on:  02/24/2018 What changed:    additional instructions  These instructions start on 02/24/2018. If you are unsure what to do until then, ask your doctor or other care provider.  Another medication with the same name was removed. Continue taking this medication, and follow the directions you see here.   GLIPIZIDE XL 10 MG 24 hr tablet Generic drug:  glipiZIDE TAKE 1 TABLET BY MOUTH ONCE DAILY WITH BREAKFAST   HYDROcodone-acetaminophen 7.5-325 MG tablet Commonly known as:  NORCO Take 1 tablet by mouth every 6 (six) hours as needed for up to 5 days for severe pain (pain score 7-10).   metFORMIN 1000 MG tablet Commonly known as:  GLUCOPHAGE Take 1 tablet (1,000 mg total) by mouth 2 (two) times daily with a meal.   metoprolol succinate 100 MG 24 hr tablet Commonly known as:  TOPROL-XL TAKE 1 TABLET BY MOUTH ONCE DAILY TAKE  WITH  OR  IMMEDIATELY  FOLLOWING  A  MEAL   PARoxetine 40 MG tablet Commonly known as:  PAXIL Take 1 tablet (40 mg total) by mouth daily.   polyethylene glycol packet Commonly known as:  MIRALAX / GLYCOLAX Take 17 g by mouth daily. Use home supply   ranitidine 150 MG tablet Commonly known as:  ZANTAC Take 150 mg by mouth 2 (two) times daily.   TYLENOL PM EXTRA STRENGTH 25-500 MG Tabs tablet Generic drug:  diphenhydramine-acetaminophen Take 1 tablet by mouth at bedtime as needed.            Discharge Care Instructions  (From admission, onward)        Start     Ordered   01/21/18 0000  Weight bearing as tolerated     01/21/18 1942     Allergies  Allergen Reactions  . Ace Inhibitors Other (See Comments)    REACTION: generic, reaction not known  . Amlodipine  Besy-Benazepril Hcl Other (See Comments)    REACTION: chest pain  . Clopidogrel Bisulfate Other (See Comments)    REACTION: GI  . Erythromycin Other (See Comments)    REACTION: GI upset   Follow-up Information    Marchia Bond, MD. Schedule an appointment as soon as possible for a visit in 2 weeks.   Specialty:  Orthopedic Surgery Contact information: [address] [place] [address] [phone]        Abner Greenspan, MD. Schedule an appointment as soon as possible for a visit in 1 week(s).   Specialties:  Family Medicine, Radiology Contact information: [place] [address] [place] [facility] Follow up.   Specialty:  Home Health Services Why:  Physical therapist will call you to set up appointment for 01/27/18. Contact information: [address] [place] [address] [phone]            The results of significant diagnostics from this hospitalization (including imaging, microbiology, ancillary and laboratory) are listed below for reference.    Significant Diagnostic Studies: Dg Chest 1 View  Result Date: 01/21/2018 CLINICAL DATA:  Patient fell today and is complaining of left hip pain. Nonsmoker. History of coronary artery disease and diabetes. No current chest complaints. EXAM: CHEST  1 VIEW COMPARISON:  PA and lateral chest x-ray of November 11, 2017. FINDINGS: The lungs are adequately inflated and clear. The heart and pulmonary vascularity are normal. The mediastinum is normal in width. There is calcification in the wall of the aortic arch. There is faintly calcified nodularity within the left breast. There is no pleural effusion. The bony thorax exhibits no acute abnormality. IMPRESSION: There is no acute cardiopulmonary abnormality. Thoracic aortic atherosclerosis. Electronically Signed   By: David  Martinique M.D.   On: 01/21/2018 13:52   Ct Head Wo Contrast  Result Date: 01/21/2018 CLINICAL  DATA:  [place] today striking head on concrete, high clinical risk of cervical spine trauma, history hypertension, type II diabetes mellitus, coronary artery disease EXAM: CT HEAD WITHOUT CONTRAST CT CERVICAL SPINE WITHOUT CONTRAST TECHNIQUE: Multidetector CT imaging of the head and cervical spine was performed following the standard protocol without intravenous contrast. Multiplanar CT image reconstructions of the cervical spine were also generated. COMPARISON:  CT head 03/28/2017 FINDINGS: CT HEAD FINDINGS Brain: Generalized atrophy. Normal ventricular morphology. No midline shift or mass effect. Small vessel chronic ischemic changes of deep cerebral white matter. No intracranial hemorrhage, mass lesion, evidence of acute infarction, or extra-axial fluid collection. Vascular: Atherosclerotic calcification of internal carotid arteries at skull base Skull: Intact Sinuses/Orbits: Visualized paranasal sinuses and mastoid air cells clear Other: N/A CT CERVICAL SPINE FINDINGS Alignment: Normal Skull base and vertebrae: Visualized skull base intact. Osseous mineralization normal. T by heights maintained without fracture, subluxation, or bone destruction. Scattered facet degenerative changes. Soft tissues and spinal canal: Prevertebral soft tissues normal thickness. 9 mm nonspecific RIGHT thyroid nodule. Cervical soft tissues otherwise unremarkable. Disc levels: Multilevel disc space narrowing and endplate spur formation greatest at C5-C6 and C6-C7. Uncovertebral spurs encroach upon cervical neural foramina bilaterally at C5-C6 and C6-C7. Bulging discs noted at C5-C6 and C6-C7. Upper chest: Minimal biapical scarring. Other: Atherosclerotic calcifications at the carotid bifurcations bilaterally. IMPRESSION: Atrophy with small vessel chronic ischemic changes of deep cerebral white matter. No acute intracranial abnormalities. Degenerative disc and facet disease changes of the cervical spine. No acute cervical spine  abnormalities. Electronically Signed   By: Lavonia Dana M.D.   On: 01/21/2018 14:16   Ct Cervical Spine Wo Contrast  Result Date: 01/21/2018 CLINICAL DATA:  [place] today striking head on concrete, high clinical risk of cervical spine trauma, history hypertension, type II diabetes mellitus, coronary artery disease EXAM: CT HEAD WITHOUT CONTRAST CT CERVICAL SPINE WITHOUT CONTRAST TECHNIQUE: Multidetector CT imaging of the head and cervical spine was performed following the standard protocol without intravenous contrast. Multiplanar CT image reconstructions of the cervical spine were also generated. COMPARISON:  CT head 03/28/2017 FINDINGS: CT HEAD FINDINGS Brain: Generalized atrophy. Normal ventricular morphology. No midline shift or mass effect. Small vessel chronic ischemic changes of deep cerebral white matter. No intracranial hemorrhage, mass lesion, evidence of acute infarction, or extra-axial fluid collection. Vascular: Atherosclerotic  calcification of internal carotid arteries at skull base Skull: Intact Sinuses/Orbits: Visualized paranasal sinuses and mastoid air cells clear Other: N/A CT CERVICAL SPINE FINDINGS Alignment: Normal Skull base and vertebrae: Visualized skull base intact. Osseous mineralization normal. T by heights maintained without fracture, subluxation, or bone destruction. Scattered facet degenerative changes. Soft tissues and spinal canal: Prevertebral soft tissues normal thickness. 9 mm nonspecific RIGHT thyroid nodule. Cervical soft tissues otherwise unremarkable. Disc levels: Multilevel disc space narrowing and endplate spur formation greatest at C5-C6 and C6-C7. Uncovertebral spurs encroach upon cervical neural foramina bilaterally at C5-C6 and C6-C7. Bulging discs noted at C5-C6 and C6-C7. Upper chest: Minimal biapical scarring. Other: Atherosclerotic calcifications at the carotid bifurcations bilaterally. IMPRESSION: Atrophy with small vessel chronic ischemic changes of deep cerebral  white matter. No acute intracranial abnormalities. Degenerative disc and facet disease changes of the cervical spine. No acute cervical spine abnormalities. Electronically Signed   By: Lavonia Dana M.D.   On: 01/21/2018 14:16   Dg Hip Port Unilat With Pelvis 1v Left  Result Date: 01/21/2018 CLINICAL DATA:  Hemiarthroplasty EXAM: DG HIP (WITH OR WITHOUT PELVIS) 1V PORT LEFT COMPARISON:  Earlier today FINDINGS: Left hip hemiarthroplasty is located. Negative for periprosthetic fracture. IMPRESSION: No acute finding after left hip arthroplasty. Electronically Signed   By: Monte Fantasia M.D.   On: 01/21/2018 20:48   Dg Hip Unilat With Pelvis 2-3 Views Left  Result Date: 01/21/2018 CLINICAL DATA:  The patient fell today and is complaining of left hip pain. EXAM: DG HIP (WITH OR WITHOUT PELVIS) 2-3V LEFT COMPARISON:  No recent studies in [facility] FINDINGS: The patient has sustained an acute subcapital fracture of the left hip. There is mild superior migration of the proximal femur with respect to the mildly rotated femoral head. The left acetabulum appears intact. No acute abnormality of the bony pelvis is observed. IMPRESSION: Acute subcapital fracture of the left hip. Electronically Signed   By: David  Martinique M.D.   On: 01/21/2018 13:54    Microbiology: No results found for this or any previous visit (from the past 240 hour(s)).   Labs: Basic Metabolic Panel: Recent Labs  Lab 01/22/18 0337 01/23/18 0526 01/24/18 0346  NA 135 136 136  K 5.2* 3.9 3.9  CL 100 100 101  CO2 24 24 25   GLUCOSE 214* 207* 236*  BUN 25* 25* 19  CREATININE 1.32* 1.12* 1.02*  CALCIUM 8.5* 8.3* 8.8*   Liver Function Tests: No results for input(s): AST, ALT, ALKPHOS, BILITOT, PROT, ALBUMIN in the last 168 hours. No results for input(s): LIPASE, AMYLASE in the last 168 hours. No results for input(s): AMMONIA in the last 168 hours. CBC: Recent Labs  Lab 01/22/18 0337 01/23/18 0526 01/24/18 0346  WBC 11.0* 11.6* 15.0*   HGB 8.0* 7.2* 11.8*  HCT 24.9* 22.1* 34.7*  MCV 94.7 95.3 89.4  PLT 173 165 167   Cardiac Enzymes: Recent Labs  Lab 01/22/18 0337  CKTOTAL 234   BNP: BNP (last 3 results) Recent Labs    01/21/18 1323  BNP 219.2*    ProBNP (last 3 results) No results for input(s): PROBNP in the last 8760 hours.  CBG: Recent Labs  Lab 01/23/18 1147 01/23/18 1702 01/23/18 2344 01/24/18 0843 01/24/18 1146  GLUCAP 280* 256* 282* 394* 274*       Signed:  Domenic Polite MD.  [facility] 01/28/2018, 2:26 PM

## 2018-01-29 DIAGNOSIS — E11319 Type 2 diabetes mellitus with unspecified diabetic retinopathy without macular edema: Secondary | ICD-10-CM | POA: Diagnosis not present

## 2018-01-29 DIAGNOSIS — E1122 Type 2 diabetes mellitus with diabetic chronic kidney disease: Secondary | ICD-10-CM | POA: Diagnosis not present

## 2018-01-29 DIAGNOSIS — N182 Chronic kidney disease, stage 2 (mild): Secondary | ICD-10-CM | POA: Diagnosis not present

## 2018-01-29 DIAGNOSIS — E785 Hyperlipidemia, unspecified: Secondary | ICD-10-CM | POA: Diagnosis not present

## 2018-01-29 DIAGNOSIS — E1169 Type 2 diabetes mellitus with other specified complication: Secondary | ICD-10-CM | POA: Diagnosis not present

## 2018-01-29 DIAGNOSIS — I129 Hypertensive chronic kidney disease with stage 1 through stage 4 chronic kidney disease, or unspecified chronic kidney disease: Secondary | ICD-10-CM | POA: Diagnosis not present

## 2018-01-29 DIAGNOSIS — I2510 Atherosclerotic heart disease of native coronary artery without angina pectoris: Secondary | ICD-10-CM | POA: Diagnosis not present

## 2018-01-29 DIAGNOSIS — S72012D Unspecified intracapsular fracture of left femur, subsequent encounter for closed fracture with routine healing: Secondary | ICD-10-CM | POA: Diagnosis not present

## 2018-01-29 DIAGNOSIS — E1151 Type 2 diabetes mellitus with diabetic peripheral angiopathy without gangrene: Secondary | ICD-10-CM | POA: Diagnosis not present

## 2018-01-29 DIAGNOSIS — E559 Vitamin D deficiency, unspecified: Secondary | ICD-10-CM | POA: Diagnosis not present

## 2018-01-29 DIAGNOSIS — M1991 Primary osteoarthritis, unspecified site: Secondary | ICD-10-CM | POA: Diagnosis not present

## 2018-01-30 ENCOUNTER — Inpatient Hospital Stay: Payer: Medicare Other | Admitting: Family Medicine

## 2018-01-30 DIAGNOSIS — I2510 Atherosclerotic heart disease of native coronary artery without angina pectoris: Secondary | ICD-10-CM | POA: Diagnosis not present

## 2018-01-30 DIAGNOSIS — N182 Chronic kidney disease, stage 2 (mild): Secondary | ICD-10-CM | POA: Diagnosis not present

## 2018-01-30 DIAGNOSIS — E559 Vitamin D deficiency, unspecified: Secondary | ICD-10-CM | POA: Diagnosis not present

## 2018-01-30 DIAGNOSIS — E1169 Type 2 diabetes mellitus with other specified complication: Secondary | ICD-10-CM | POA: Diagnosis not present

## 2018-01-30 DIAGNOSIS — I129 Hypertensive chronic kidney disease with stage 1 through stage 4 chronic kidney disease, or unspecified chronic kidney disease: Secondary | ICD-10-CM | POA: Diagnosis not present

## 2018-01-30 DIAGNOSIS — E1122 Type 2 diabetes mellitus with diabetic chronic kidney disease: Secondary | ICD-10-CM | POA: Diagnosis not present

## 2018-01-30 DIAGNOSIS — S72012D Unspecified intracapsular fracture of left femur, subsequent encounter for closed fracture with routine healing: Secondary | ICD-10-CM | POA: Diagnosis not present

## 2018-01-30 DIAGNOSIS — M1991 Primary osteoarthritis, unspecified site: Secondary | ICD-10-CM | POA: Diagnosis not present

## 2018-01-30 DIAGNOSIS — E785 Hyperlipidemia, unspecified: Secondary | ICD-10-CM | POA: Diagnosis not present

## 2018-01-30 DIAGNOSIS — E1151 Type 2 diabetes mellitus with diabetic peripheral angiopathy without gangrene: Secondary | ICD-10-CM | POA: Diagnosis not present

## 2018-01-30 DIAGNOSIS — E11319 Type 2 diabetes mellitus with unspecified diabetic retinopathy without macular edema: Secondary | ICD-10-CM | POA: Diagnosis not present

## 2018-02-02 DIAGNOSIS — E1151 Type 2 diabetes mellitus with diabetic peripheral angiopathy without gangrene: Secondary | ICD-10-CM | POA: Diagnosis not present

## 2018-02-02 DIAGNOSIS — E559 Vitamin D deficiency, unspecified: Secondary | ICD-10-CM | POA: Diagnosis not present

## 2018-02-02 DIAGNOSIS — S72012D Unspecified intracapsular fracture of left femur, subsequent encounter for closed fracture with routine healing: Secondary | ICD-10-CM | POA: Diagnosis not present

## 2018-02-02 DIAGNOSIS — I129 Hypertensive chronic kidney disease with stage 1 through stage 4 chronic kidney disease, or unspecified chronic kidney disease: Secondary | ICD-10-CM | POA: Diagnosis not present

## 2018-02-02 DIAGNOSIS — E1169 Type 2 diabetes mellitus with other specified complication: Secondary | ICD-10-CM | POA: Diagnosis not present

## 2018-02-02 DIAGNOSIS — E1122 Type 2 diabetes mellitus with diabetic chronic kidney disease: Secondary | ICD-10-CM | POA: Diagnosis not present

## 2018-02-02 DIAGNOSIS — M1991 Primary osteoarthritis, unspecified site: Secondary | ICD-10-CM | POA: Diagnosis not present

## 2018-02-02 DIAGNOSIS — I2510 Atherosclerotic heart disease of native coronary artery without angina pectoris: Secondary | ICD-10-CM | POA: Diagnosis not present

## 2018-02-02 DIAGNOSIS — N182 Chronic kidney disease, stage 2 (mild): Secondary | ICD-10-CM | POA: Diagnosis not present

## 2018-02-02 DIAGNOSIS — E11319 Type 2 diabetes mellitus with unspecified diabetic retinopathy without macular edema: Secondary | ICD-10-CM | POA: Diagnosis not present

## 2018-02-02 DIAGNOSIS — E785 Hyperlipidemia, unspecified: Secondary | ICD-10-CM | POA: Diagnosis not present

## 2018-02-04 DIAGNOSIS — S72002D Fracture of unspecified part of neck of left femur, subsequent encounter for closed fracture with routine healing: Secondary | ICD-10-CM | POA: Diagnosis not present

## 2018-02-05 DIAGNOSIS — I2510 Atherosclerotic heart disease of native coronary artery without angina pectoris: Secondary | ICD-10-CM | POA: Diagnosis not present

## 2018-02-05 DIAGNOSIS — E559 Vitamin D deficiency, unspecified: Secondary | ICD-10-CM | POA: Diagnosis not present

## 2018-02-05 DIAGNOSIS — M1991 Primary osteoarthritis, unspecified site: Secondary | ICD-10-CM | POA: Diagnosis not present

## 2018-02-05 DIAGNOSIS — E1151 Type 2 diabetes mellitus with diabetic peripheral angiopathy without gangrene: Secondary | ICD-10-CM | POA: Diagnosis not present

## 2018-02-05 DIAGNOSIS — I129 Hypertensive chronic kidney disease with stage 1 through stage 4 chronic kidney disease, or unspecified chronic kidney disease: Secondary | ICD-10-CM | POA: Diagnosis not present

## 2018-02-05 DIAGNOSIS — S72012D Unspecified intracapsular fracture of left femur, subsequent encounter for closed fracture with routine healing: Secondary | ICD-10-CM | POA: Diagnosis not present

## 2018-02-05 DIAGNOSIS — E785 Hyperlipidemia, unspecified: Secondary | ICD-10-CM | POA: Diagnosis not present

## 2018-02-05 DIAGNOSIS — E11319 Type 2 diabetes mellitus with unspecified diabetic retinopathy without macular edema: Secondary | ICD-10-CM | POA: Diagnosis not present

## 2018-02-05 DIAGNOSIS — E1169 Type 2 diabetes mellitus with other specified complication: Secondary | ICD-10-CM | POA: Diagnosis not present

## 2018-02-05 DIAGNOSIS — E1122 Type 2 diabetes mellitus with diabetic chronic kidney disease: Secondary | ICD-10-CM | POA: Diagnosis not present

## 2018-02-05 DIAGNOSIS — N182 Chronic kidney disease, stage 2 (mild): Secondary | ICD-10-CM | POA: Diagnosis not present

## 2018-02-06 ENCOUNTER — Telehealth: Payer: Self-pay | Admitting: Family Medicine

## 2018-02-06 DIAGNOSIS — M1991 Primary osteoarthritis, unspecified site: Secondary | ICD-10-CM | POA: Diagnosis not present

## 2018-02-06 DIAGNOSIS — I10 Essential (primary) hypertension: Secondary | ICD-10-CM

## 2018-02-06 DIAGNOSIS — I2510 Atherosclerotic heart disease of native coronary artery without angina pectoris: Secondary | ICD-10-CM | POA: Diagnosis not present

## 2018-02-06 DIAGNOSIS — E559 Vitamin D deficiency, unspecified: Secondary | ICD-10-CM | POA: Diagnosis not present

## 2018-02-06 DIAGNOSIS — E1169 Type 2 diabetes mellitus with other specified complication: Secondary | ICD-10-CM

## 2018-02-06 DIAGNOSIS — E785 Hyperlipidemia, unspecified: Secondary | ICD-10-CM | POA: Diagnosis not present

## 2018-02-06 DIAGNOSIS — S72012D Unspecified intracapsular fracture of left femur, subsequent encounter for closed fracture with routine healing: Secondary | ICD-10-CM | POA: Diagnosis not present

## 2018-02-06 DIAGNOSIS — IMO0002 Reserved for concepts with insufficient information to code with codable children: Secondary | ICD-10-CM

## 2018-02-06 DIAGNOSIS — E1122 Type 2 diabetes mellitus with diabetic chronic kidney disease: Secondary | ICD-10-CM | POA: Diagnosis not present

## 2018-02-06 DIAGNOSIS — E11319 Type 2 diabetes mellitus with unspecified diabetic retinopathy without macular edema: Secondary | ICD-10-CM | POA: Diagnosis not present

## 2018-02-06 DIAGNOSIS — E782 Mixed hyperlipidemia: Secondary | ICD-10-CM

## 2018-02-06 DIAGNOSIS — E1151 Type 2 diabetes mellitus with diabetic peripheral angiopathy without gangrene: Secondary | ICD-10-CM | POA: Diagnosis not present

## 2018-02-06 DIAGNOSIS — N182 Chronic kidney disease, stage 2 (mild): Secondary | ICD-10-CM | POA: Diagnosis not present

## 2018-02-06 DIAGNOSIS — I129 Hypertensive chronic kidney disease with stage 1 through stage 4 chronic kidney disease, or unspecified chronic kidney disease: Secondary | ICD-10-CM | POA: Diagnosis not present

## 2018-02-06 DIAGNOSIS — E1165 Type 2 diabetes mellitus with hyperglycemia: Secondary | ICD-10-CM

## 2018-02-06 NOTE — Telephone Encounter (Signed)
-----   Message from Ellamae Sia sent at 02/04/2018 10:36 AM EDT ----- Regarding: Lab orders for Monday, 7.29.19 Patient is scheduled for CPX labs, please order future labs, Thanks , Karna Christmas

## 2018-02-09 ENCOUNTER — Other Ambulatory Visit (INDEPENDENT_AMBULATORY_CARE_PROVIDER_SITE_OTHER): Payer: Medicare Other

## 2018-02-09 DIAGNOSIS — E782 Mixed hyperlipidemia: Secondary | ICD-10-CM | POA: Diagnosis not present

## 2018-02-09 DIAGNOSIS — E1169 Type 2 diabetes mellitus with other specified complication: Secondary | ICD-10-CM | POA: Diagnosis not present

## 2018-02-09 DIAGNOSIS — IMO0002 Reserved for concepts with insufficient information to code with codable children: Secondary | ICD-10-CM

## 2018-02-09 DIAGNOSIS — E11319 Type 2 diabetes mellitus with unspecified diabetic retinopathy without macular edema: Secondary | ICD-10-CM

## 2018-02-09 DIAGNOSIS — E559 Vitamin D deficiency, unspecified: Secondary | ICD-10-CM

## 2018-02-09 DIAGNOSIS — E1165 Type 2 diabetes mellitus with hyperglycemia: Secondary | ICD-10-CM | POA: Diagnosis not present

## 2018-02-09 DIAGNOSIS — I2510 Atherosclerotic heart disease of native coronary artery without angina pectoris: Secondary | ICD-10-CM | POA: Diagnosis not present

## 2018-02-09 DIAGNOSIS — I129 Hypertensive chronic kidney disease with stage 1 through stage 4 chronic kidney disease, or unspecified chronic kidney disease: Secondary | ICD-10-CM | POA: Diagnosis not present

## 2018-02-09 DIAGNOSIS — S72012D Unspecified intracapsular fracture of left femur, subsequent encounter for closed fracture with routine healing: Secondary | ICD-10-CM | POA: Diagnosis not present

## 2018-02-09 DIAGNOSIS — M1991 Primary osteoarthritis, unspecified site: Secondary | ICD-10-CM | POA: Diagnosis not present

## 2018-02-09 DIAGNOSIS — I10 Essential (primary) hypertension: Secondary | ICD-10-CM | POA: Diagnosis not present

## 2018-02-09 DIAGNOSIS — E1151 Type 2 diabetes mellitus with diabetic peripheral angiopathy without gangrene: Secondary | ICD-10-CM | POA: Diagnosis not present

## 2018-02-09 DIAGNOSIS — N182 Chronic kidney disease, stage 2 (mild): Secondary | ICD-10-CM | POA: Diagnosis not present

## 2018-02-09 DIAGNOSIS — E785 Hyperlipidemia, unspecified: Secondary | ICD-10-CM | POA: Diagnosis not present

## 2018-02-09 DIAGNOSIS — E1122 Type 2 diabetes mellitus with diabetic chronic kidney disease: Secondary | ICD-10-CM | POA: Diagnosis not present

## 2018-02-09 LAB — CBC WITH DIFFERENTIAL/PLATELET
Basophils Absolute: 0.1 10*3/uL (ref 0.0–0.1)
Basophils Relative: 0.6 % (ref 0.0–3.0)
Eosinophils Absolute: 0.1 10*3/uL (ref 0.0–0.7)
Eosinophils Relative: 1.2 % (ref 0.0–5.0)
HEMATOCRIT: 37.2 % (ref 36.0–46.0)
HEMOGLOBIN: 12.8 g/dL (ref 12.0–15.0)
LYMPHS PCT: 35.2 % (ref 12.0–46.0)
Lymphs Abs: 3.8 10*3/uL (ref 0.7–4.0)
MCHC: 34.5 g/dL (ref 30.0–36.0)
MCV: 91.4 fl (ref 78.0–100.0)
MONOS PCT: 5.2 % (ref 3.0–12.0)
Monocytes Absolute: 0.6 10*3/uL (ref 0.1–1.0)
Neutro Abs: 6.2 10*3/uL (ref 1.4–7.7)
Neutrophils Relative %: 57.8 % (ref 43.0–77.0)
Platelets: 424 10*3/uL — ABNORMAL HIGH (ref 150.0–400.0)
RBC: 4.07 Mil/uL (ref 3.87–5.11)
RDW: 13.7 % (ref 11.5–15.5)
WBC: 10.7 10*3/uL — AB (ref 4.0–10.5)

## 2018-02-09 LAB — LIPID PANEL
CHOL/HDL RATIO: 3
Cholesterol: 107 mg/dL (ref 0–200)
HDL: 34.6 mg/dL — ABNORMAL LOW (ref >39.00)
LDL Cholesterol: 38 mg/dL (ref 0–99)
NonHDL: 72.81
TRIGLYCERIDES: 176 mg/dL — AB (ref 0.0–149.0)
VLDL: 35.2 mg/dL (ref 0.0–40.0)

## 2018-02-09 LAB — COMPREHENSIVE METABOLIC PANEL
ALBUMIN: 3.9 g/dL (ref 3.5–5.2)
ALT: 7 U/L (ref 0–35)
AST: 14 U/L (ref 0–37)
Alkaline Phosphatase: 89 U/L (ref 39–117)
BUN: 32 mg/dL — ABNORMAL HIGH (ref 6–23)
CALCIUM: 10.2 mg/dL (ref 8.4–10.5)
CHLORIDE: 100 meq/L (ref 96–112)
CO2: 23 mEq/L (ref 19–32)
Creatinine, Ser: 1.04 mg/dL (ref 0.40–1.20)
GFR: 53.92 mL/min — AB (ref >60.00)
Glucose, Bld: 184 mg/dL — ABNORMAL HIGH (ref 70–99)
POTASSIUM: 4.9 meq/L (ref 3.5–5.1)
Sodium: 134 mEq/L — ABNORMAL LOW (ref 135–145)
Total Bilirubin: 0.5 mg/dL (ref 0.2–1.2)
Total Protein: 7.3 g/dL (ref 6.0–8.3)

## 2018-02-09 LAB — TSH: TSH: 2.44 u[IU]/mL (ref 0.35–4.50)

## 2018-02-09 LAB — VITAMIN D 25 HYDROXY (VIT D DEFICIENCY, FRACTURES): VITD: 24.67 ng/mL — AB (ref 30.00–100.00)

## 2018-02-09 LAB — HEMOGLOBIN A1C: Hgb A1c MFr Bld: 6.8 % — ABNORMAL HIGH (ref 4.6–6.5)

## 2018-02-12 DIAGNOSIS — E785 Hyperlipidemia, unspecified: Secondary | ICD-10-CM | POA: Diagnosis not present

## 2018-02-12 DIAGNOSIS — S72012D Unspecified intracapsular fracture of left femur, subsequent encounter for closed fracture with routine healing: Secondary | ICD-10-CM | POA: Diagnosis not present

## 2018-02-12 DIAGNOSIS — N182 Chronic kidney disease, stage 2 (mild): Secondary | ICD-10-CM | POA: Diagnosis not present

## 2018-02-12 DIAGNOSIS — E1151 Type 2 diabetes mellitus with diabetic peripheral angiopathy without gangrene: Secondary | ICD-10-CM | POA: Diagnosis not present

## 2018-02-12 DIAGNOSIS — E559 Vitamin D deficiency, unspecified: Secondary | ICD-10-CM | POA: Diagnosis not present

## 2018-02-12 DIAGNOSIS — M1991 Primary osteoarthritis, unspecified site: Secondary | ICD-10-CM | POA: Diagnosis not present

## 2018-02-12 DIAGNOSIS — E1169 Type 2 diabetes mellitus with other specified complication: Secondary | ICD-10-CM | POA: Diagnosis not present

## 2018-02-12 DIAGNOSIS — E1122 Type 2 diabetes mellitus with diabetic chronic kidney disease: Secondary | ICD-10-CM | POA: Diagnosis not present

## 2018-02-12 DIAGNOSIS — E11319 Type 2 diabetes mellitus with unspecified diabetic retinopathy without macular edema: Secondary | ICD-10-CM | POA: Diagnosis not present

## 2018-02-12 DIAGNOSIS — I129 Hypertensive chronic kidney disease with stage 1 through stage 4 chronic kidney disease, or unspecified chronic kidney disease: Secondary | ICD-10-CM | POA: Diagnosis not present

## 2018-02-12 DIAGNOSIS — I2510 Atherosclerotic heart disease of native coronary artery without angina pectoris: Secondary | ICD-10-CM | POA: Diagnosis not present

## 2018-02-16 ENCOUNTER — Ambulatory Visit (INDEPENDENT_AMBULATORY_CARE_PROVIDER_SITE_OTHER): Payer: Medicare Other | Admitting: Family Medicine

## 2018-02-16 ENCOUNTER — Encounter: Payer: Self-pay | Admitting: Family Medicine

## 2018-02-16 ENCOUNTER — Encounter: Payer: Medicare Other | Admitting: Family

## 2018-02-16 VITALS — BP 130/72 | HR 63 | Temp 97.4°F | Ht 61.5 in | Wt 105.0 lb

## 2018-02-16 DIAGNOSIS — M1991 Primary osteoarthritis, unspecified site: Secondary | ICD-10-CM | POA: Diagnosis not present

## 2018-02-16 DIAGNOSIS — E11319 Type 2 diabetes mellitus with unspecified diabetic retinopathy without macular edema: Secondary | ICD-10-CM

## 2018-02-16 DIAGNOSIS — E559 Vitamin D deficiency, unspecified: Secondary | ICD-10-CM

## 2018-02-16 DIAGNOSIS — E1169 Type 2 diabetes mellitus with other specified complication: Secondary | ICD-10-CM

## 2018-02-16 DIAGNOSIS — N182 Chronic kidney disease, stage 2 (mild): Secondary | ICD-10-CM

## 2018-02-16 DIAGNOSIS — E1122 Type 2 diabetes mellitus with diabetic chronic kidney disease: Secondary | ICD-10-CM | POA: Diagnosis not present

## 2018-02-16 DIAGNOSIS — I129 Hypertensive chronic kidney disease with stage 1 through stage 4 chronic kidney disease, or unspecified chronic kidney disease: Secondary | ICD-10-CM | POA: Diagnosis not present

## 2018-02-16 DIAGNOSIS — Z0000 Encounter for general adult medical examination without abnormal findings: Secondary | ICD-10-CM

## 2018-02-16 DIAGNOSIS — IMO0002 Reserved for concepts with insufficient information to code with codable children: Secondary | ICD-10-CM

## 2018-02-16 DIAGNOSIS — I10 Essential (primary) hypertension: Secondary | ICD-10-CM | POA: Diagnosis not present

## 2018-02-16 DIAGNOSIS — S72012D Unspecified intracapsular fracture of left femur, subsequent encounter for closed fracture with routine healing: Secondary | ICD-10-CM | POA: Diagnosis not present

## 2018-02-16 DIAGNOSIS — M8580 Other specified disorders of bone density and structure, unspecified site: Secondary | ICD-10-CM | POA: Diagnosis not present

## 2018-02-16 DIAGNOSIS — E1151 Type 2 diabetes mellitus with diabetic peripheral angiopathy without gangrene: Secondary | ICD-10-CM | POA: Diagnosis not present

## 2018-02-16 DIAGNOSIS — E1165 Type 2 diabetes mellitus with hyperglycemia: Secondary | ICD-10-CM

## 2018-02-16 DIAGNOSIS — Z1211 Encounter for screening for malignant neoplasm of colon: Secondary | ICD-10-CM

## 2018-02-16 DIAGNOSIS — E785 Hyperlipidemia, unspecified: Secondary | ICD-10-CM | POA: Diagnosis not present

## 2018-02-16 DIAGNOSIS — E782 Mixed hyperlipidemia: Secondary | ICD-10-CM

## 2018-02-16 DIAGNOSIS — I2510 Atherosclerotic heart disease of native coronary artery without angina pectoris: Secondary | ICD-10-CM | POA: Diagnosis not present

## 2018-02-16 MED ORDER — METOPROLOL SUCCINATE ER 100 MG PO TB24: ORAL_TABLET | ORAL | 3 refills | Status: DC

## 2018-02-16 MED ORDER — METFORMIN HCL 1000 MG PO TABS: 1000.0000 mg | ORAL_TABLET | Freq: Two times a day (BID) | ORAL | 3 refills | Status: DC

## 2018-02-16 MED ORDER — GLIPIZIDE ER 10 MG PO TB24: ORAL_TABLET | ORAL | 3 refills | Status: DC

## 2018-02-16 MED ORDER — AMLODIPINE BESYLATE 5 MG PO TABS: 5.0000 mg | ORAL_TABLET | Freq: Every day | ORAL | 3 refills | Status: DC

## 2018-02-16 NOTE — Progress Notes (Signed)
Subjective:    Patient ID: Stephanie [name], female    DOB: August 11, 1933, 84 y.o.   MRN: [id number]  HPI Here for health maintenance exam and to review chronic medical problems    Wt Readings from Last 3 Encounters:  02/16/18 105 lb (47.6 kg)  01/27/18 108 lb 8 oz (49.2 kg)  01/21/18 107 lb 9.4 oz (48.8 kg)  19.52 kg/m  Eating the same  Not more active  Uses walker (s/p hip fx) Has PT   Mammogram- declines  Self breast exam -no lumps  Wants a breast exam however   Colon screen-wants to do ifob kit   Tetanus shot 6/07- ins /financial   Eye exam 11/18-we do not have report-Dr Almira Bar - will send for it  Has DM retinopathy   Flu shot -will get in oct   PNA vaccine -declines due to severe local reaction in the past   dexa 12/15  Osteopenia  She stopped calcium and D Vit D 24.6 Declines another dexa until after the 1st of the year  Notably has had a hip fracture now    Zoster status - interested with shingrix if affordable   bp is stable today  No cp or palpitations or headaches or edema  No side effects to medicines  BP Readings from Last 3 Encounters:  02/16/18 130/72  01/27/18 138/72  01/24/18 (!) 150/63      DM 2 with eye and renal inv Lab Results  Component Value Date   HGBA1C 6.8 (H) 02/09/2018  up from 6.3 Overall doing well  Good blood glucose levels  Was getting SSI insulin in the hospital  No longer doing that  She sticks to a diabetic diet  Eye exam - nov (we will send for it)  Lab Results  Component Value Date   MICROALBUR 6.2 (H) 08/27/2017    Lab Results  Component Value Date   CREATININE 1.04 02/09/2018   BUN 32 (H) 02/09/2018   NA 134 (L) 02/09/2018   K 4.9 02/09/2018   CL 100 02/09/2018   CO2 23 02/09/2018   Lab Results  Component Value Date   ALT 7 02/09/2018   AST 14 02/09/2018   ALKPHOS 89 02/09/2018   BILITOT 0.5 02/09/2018   works on water intake   Lab Results  Component Value Date   WBC 10.7 (H) 02/09/2018   HGB 12.8  02/09/2018   HCT 37.2 02/09/2018   MCV 91.4 02/09/2018   PLT 424.0 (H) 02/09/2018   wbc is better than usual /mildly elevated   Cholesterol Lab Results  Component Value Date   CHOL 107 02/09/2018   CHOL 107 08/27/2017   CHOL 103 03/26/2017   Lab Results  Component Value Date   HDL 34.60 (L) 02/09/2018   HDL 36.90 (L) 08/27/2017   HDL 41.50 03/26/2017   Lab Results  Component Value Date   LDLCALC 38 02/09/2018   LDLCALC 37 08/27/2017   LDLCALC 34 03/26/2017   Lab Results  Component Value Date   TRIG 176.0 (H) 02/09/2018   TRIG 166.0 (H) 08/27/2017   TRIG 138.0 03/26/2017   Lab Results  Component Value Date   CHOLHDL 3 02/09/2018   CHOLHDL 3 08/27/2017   CHOLHDL 2 03/26/2017   Lab Results  Component Value Date   LDLDIRECT 54.0 09/04/2015   LDLDIRECT 43.0 02/28/2015   LDLDIRECT 59.0 08/23/2014   LDL is controlled with a statin   Patient Active Problem List   Diagnosis Date Noted  .  Closed fracture of left hip ([place])   . Chronic kidney disease (CKD), stage II (mild) 01/22/2018  . Fracture of femoral neck, left ([place]) 01/21/2018  . Fall 01/21/2018  . Depression 01/21/2018  . HLD (hyperlipidemia) 01/21/2018  . GERD (gastroesophageal reflux disease) 01/21/2018  . CAD (coronary artery disease) 01/21/2018  . Subcapital fracture of left hip ([place]) 01/21/2018  . Rash and nonspecific skin eruption 03/28/2017  . Carotid stenosis, bilateral 11/20/2016  . Transient cerebral ischemia 11/06/2016  . Vitamin D deficiency 09/04/2015  . Routine general medical examination at a health care facility 09/04/2015  . Encounter for Medicare annual wellness exam 05/30/2014  . Colon cancer screening 05/30/2014  . History of fall 05/26/2013  . Gynecological examination 02/20/2011  . HELICOBACTER PYLORI INFECTION 12/19/2006  . HSV 12/19/2006  . Type 2 diabetes, uncontrolled, with retinopathy ([place]) 12/19/2006  . Diabetic retinopathy ([place]) 12/19/2006  . Combined hyperlipidemia  associated with type 2 diabetes mellitus ([place]) 12/19/2006  . ANXIETY 12/19/2006  . Essential hypertension 12/19/2006  . Coronary atherosclerosis 12/19/2006  . Peripheral vascular disease due to secondary diabetes mellitus ([place]) 12/19/2006  . ALLERGIC RHINITIS 12/19/2006  . OSTEOARTHRITIS 12/19/2006  . Osteopenia 12/19/2006   Past Medical History:  Diagnosis Date  . Allergy    allergic rhinitis  . Anxiety   . Arthritis    osteoarthritis  . Degenerative disk disease    in neck  . Diabetes mellitus    type II  . Fracture of femoral neck, left ([place]) 01/21/2018  . Hyperlipidemia   . Hypertension   . Hypoaldosteronism ([place]) 6/12   from DM causing hyperkalemia   . Osteopenia   . Periodontal disease   . Peripheral vascular disease ([place])   . Transient cerebral ischemia 11/06/2016   Past Surgical History:  Procedure Laterality Date  . CARDIAC CATHETERIZATION     x2 STENT  . CHOLECYSTECTOMY    . ESOPHAGOGASTRODUODENOSCOPY  03/2004   gastritis by biopsy  . GUM SURGERY  06/2010   laser surgery for gums  . HIP ARTHROPLASTY Left 01/21/2018   Procedure: ARTHROPLASTY BIPOLAR HIP (HEMIARTHROPLASTY);  Surgeon: Marchia Bond, MD;  Location: [facility];  Service: Orthopedics;  Laterality: Left;  Marland Kitchen MM BREAST STEREO BX*L*R/S  12/12   normal  . TONSILLECTOMY    . TRANSTHORACIC ECHOCARDIOGRAM  11/2016   EF 55-60%, grade 1 DD with elevated LV end diastolic filling pressures, mildly dilated LA, mild TR and PR.   . TUBAL LIGATION     Social History   Tobacco Use  . Smoking status: Never Smoker  . Smokeless tobacco: Never Used  Substance Use Topics  . Alcohol use: No    Alcohol/week: 0.0 oz  . Drug use: No   Family History  Problem Relation Age of Onset  . Cancer Mother        vaginal CA in situ  . Hypertension Mother   . Heart disease Mother        CAD and MI  . Heart disease Father        CAD  . Diabetes Father   . Heart disease Son        congenital valve problem   Allergies    Allergen Reactions  . Ace Inhibitors Other (See Comments)    REACTION: generic, reaction not known  . Amlodipine Besy-Benazepril Hcl Other (See Comments)    REACTION: chest pain  . Clopidogrel Bisulfate Other (See Comments)    REACTION: GI  . Erythromycin Other (See Comments)  REACTION: GI upset   Current Outpatient Medications on File Prior to Visit  Medication Sig Dispense Refill  . acetaminophen (TYLENOL) 325 MG tablet Take 2 tablets (650 mg total) by mouth every 6 (six) hours as needed. 60 tablet 1  . apixaban (ELIQUIS) 2.5 MG TABS tablet Take 1 tablet (2.5 mg total) by mouth 2 (two) times daily. For 33month60 tablet 0  . atorvastatin (LIPITOR) 20 MG tablet Take 1 tablet (20 mg total) by mouth daily. 90 tablet 3  . chlorthalidone (HYGROTON) 25 MG tablet Take 1 tablet (25 mg total) by mouth daily. 90 tablet 3  . diphenhydramine-acetaminophen (TYLENOL PM EXTRA STRENGTH) 25-500 MG TABS Take 1 tablet by mouth at bedtime as needed.      .Derrill MemoON 02/24/2018] dipyridamole-aspirin (AGGRENOX) 200-25 MG 12hr capsule Take 1 capsule by mouth 2 (two) times daily. RESTART THIS IN 30days when you come off ELIQUIS    . PARoxetine (PAXIL) 40 MG tablet Take 1 tablet (40 mg total) by mouth daily. 90 tablet 3  . polyethylene glycol (MIRALAX / GLYCOLAX) packet Take 17 g by mouth daily. Use home supply  0  . ranitidine (ZANTAC) 150 MG tablet Take 150 mg by mouth 2 (two) times daily.     No current facility-administered medications on file prior to visit.      Review of Systems  Constitutional: Positive for fatigue. Negative for activity change, appetite change, fever and unexpected weight change.  HENT: Negative for congestion, ear pain, rhinorrhea, sinus pressure and sore throat.   Eyes: Negative for pain, redness and visual disturbance.  Respiratory: Negative for cough, shortness of breath and wheezing.   Cardiovascular: Negative for chest pain and palpitations.  Gastrointestinal: Negative for  abdominal pain, blood in stool, constipation and diarrhea.  Endocrine: Negative for polydipsia and polyuria.  Genitourinary: Negative for dysuria, frequency and urgency.  Musculoskeletal: Negative for arthralgias, back pain and myalgias.       Hip pain gradually improving   Skin: Negative for pallor and rash.  Allergic/Immunologic: Negative for environmental allergies.  Neurological: Negative for dizziness, syncope and headaches.       Generally poor balance   Hematological: Negative for adenopathy. Does not bruise/bleed easily.  Psychiatric/Behavioral: Negative for decreased concentration and dysphoric mood. The patient is not nervous/anxious.        Objective:   Physical Exam  Constitutional: She appears well-developed and well-nourished. No distress.  Well appearing   HENT:  Head: Normocephalic and atraumatic.  Right Ear: External ear normal.  Left Ear: External ear normal.  Mouth/Throat: Oropharynx is clear and moist.  Eyes: Pupils are equal, round, and reactive to light. Conjunctivae and EOM are normal. No scleral icterus.  Neck: Normal range of motion. Neck supple. No JVD present. Carotid bruit is not present. No thyromegaly present.  Cardiovascular: Normal rate, regular rhythm, normal heart sounds and intact distal pulses. Exam reveals no gallop.  Pulmonary/Chest: Effort normal and breath sounds normal. No respiratory distress. She has no wheezes. She exhibits no tenderness. No breast tenderness, discharge or bleeding.  Abdominal: Soft. Bowel sounds are normal. She exhibits no distension, no abdominal bruit and no mass. There is no tenderness.  Genitourinary: No breast tenderness, discharge or bleeding.  Genitourinary Comments: Breast exam: No mass, nodules, thickening, tenderness, bulging, retraction, inflamation, nipple discharge or skin changes noted.  No axillary or clavicular LA.      Musculoskeletal: Normal range of motion. She exhibits no edema, tenderness or deformity.    No kyphosis  Slow gait with walker s/p hip fracture   Lymphadenopathy:    She has no cervical adenopathy.  Neurological: She is alert. She has normal reflexes. She displays normal reflexes. No cranial nerve deficit. She exhibits normal muscle tone. Coordination normal.  Skin: Skin is warm and dry. No rash noted. No erythema. No pallor.  Fair complexion  Psychiatric: She has a normal mood and affect.          Assessment & Plan:   Problem List Items Addressed This Visit      Cardiovascular and Mediastinum   Essential hypertension    In setting of CAD Recent cardiology f/u  bp in fair control at this time  BP Readings from Last 1 Encounters:  02/16/18 130/72   No changes needed Most recent labs reviewed  Disc lifstyle change with low sodium diet and exercise        Relevant Medications   amLODipine (NORVASC) 5 MG tablet   metoprolol succinate (TOPROL-XL) 100 MG 24 hr tablet     Endocrine   Combined hyperlipidemia associated with type 2 diabetes mellitus ([place])    Disc goals for lipids and reasons to control them Rev last labs with pt Rev low sat fat diet in detail Controlled with atorvastatin  Urged to gradually advance exercise as tolerated       Relevant Medications   amLODipine (NORVASC) 5 MG tablet   glipiZIDE (GLIPIZIDE XL) 10 MG 24 hr tablet   metFORMIN (GLUCOPHAGE) 1000 MG tablet   metoprolol succinate (TOPROL-XL) 100 MG 24 hr tablet   Diabetic retinopathy ([place])    Sent for most recent DM eye exam report       Relevant Medications   glipiZIDE (GLIPIZIDE XL) 10 MG 24 hr tablet   metFORMIN (GLUCOPHAGE) 1000 MG tablet   Type 2 diabetes, uncontrolled, with retinopathy ([place])    Lab Results  Component Value Date   HGBA1C 6.8 (H) 02/09/2018  this is up slightly  Now eating more normally at home  Taking glipizide and metformin  PT for exercise Disc eye and foot care  F/u 6 mo      Relevant Medications   glipiZIDE (GLIPIZIDE XL) 10 MG 24 hr tablet    metFORMIN (GLUCOPHAGE) 1000 MG tablet     Musculoskeletal and Integument   Osteopenia    Due for dexa  Has now had a hip fx Not taking ca or D-enc strongly to do so  Continue PT Declines dexa until after jan 1  Disc fall prev        Genitourinary   Chronic kidney disease (CKD), stage II (mild)    Stable with better fluid intake Watching low na (134)         Other   Colon cancer screening    IFOB kit given       Routine general medical examination at a health care facility - Primary    Reviewed health habits including diet and exercise and skin cancer prevention Reviewed appropriate screening tests for age  Also reviewed health mt list, fam hx and immunization status , as well as social and family history   See HPI Declines dexa until after jan 1 Declines mammograms entirely  Declines prevnar due to local rxn to pna 23 in the past Interested in shingrix if affordable  Ins will not pay for Td booster  Enc flu shot in the fall-she always gets one  Did not do amw this year -will consider next year  Vitamin D deficiency

## 2018-02-16 NOTE — Assessment & Plan Note (Signed)
IFOB kit given

## 2018-02-16 NOTE — Assessment & Plan Note (Signed)
In setting of CAD Recent cardiology f/u  bp in fair control at this time  BP Readings from Last 1 Encounters:  02/16/18 130/72   No changes needed Most recent labs reviewed  Disc lifstyle change with low sodium diet and exercise

## 2018-02-16 NOTE — Assessment & Plan Note (Signed)
Stable with better fluid intake Watching low na (134)

## 2018-02-16 NOTE — Assessment & Plan Note (Signed)
Due for dexa  Has now had a hip fx Not taking ca or D-enc strongly to do so  Continue PT Declines dexa until after jan 1  Disc fall prev

## 2018-02-16 NOTE — Assessment & Plan Note (Signed)
Lab Results  Component Value Date   HGBA1C 6.8 (H) 02/09/2018  this is up slightly  Now eating more normally at home  Taking glipizide and metformin  PT for exercise Disc eye and foot care  F/u 6 mo

## 2018-02-16 NOTE — Assessment & Plan Note (Signed)
Sent for most recent DM eye exam report

## 2018-02-16 NOTE — Assessment & Plan Note (Signed)
Disc goals for lipids and reasons to control them Rev last labs with pt Rev low sat fat diet in detail Controlled with atorvastatin  Urged to gradually advance exercise as tolerated

## 2018-02-16 NOTE — Assessment & Plan Note (Signed)
Reviewed health habits including diet and exercise and skin cancer prevention Reviewed appropriate screening tests for age  Also reviewed health mt list, fam hx and immunization status , as well as social and family history   See HPI Declines dexa until after jan 1 Declines mammograms entirely  Declines prevnar due to local rxn to pna 23 in the past Interested in shingrix if affordable  Ins will not pay for Td booster  Enc flu shot in the fall-she always gets one  Did not do amw this year -will consider next year

## 2018-02-16 NOTE — Patient Instructions (Addendum)
In you want a tetanus shot - call pharmacy or health dept or check prices  Don't forget to get a flu shot this fall   Try to get 1200-1500 mg of calcium per day with at least 4000 extra  iu of vitamin D - for bone health    You are due for a bone density test and discussion of medication to prevent fractures  Call us when ready (I know you want to wait until after jan 1)   If you are interested in the new shingles vaccine (Shingrix) - call your local pharmacy to check on coverage and availability  If affordable - get on a wait list for it   Aim for 64 oz of fluids per day-mostly water   Please do stool kit for colon cancer screening   Follow up in 6 months

## 2018-02-18 DIAGNOSIS — E1122 Type 2 diabetes mellitus with diabetic chronic kidney disease: Secondary | ICD-10-CM | POA: Diagnosis not present

## 2018-02-18 DIAGNOSIS — E785 Hyperlipidemia, unspecified: Secondary | ICD-10-CM | POA: Diagnosis not present

## 2018-02-18 DIAGNOSIS — E1151 Type 2 diabetes mellitus with diabetic peripheral angiopathy without gangrene: Secondary | ICD-10-CM | POA: Diagnosis not present

## 2018-02-18 DIAGNOSIS — E11319 Type 2 diabetes mellitus with unspecified diabetic retinopathy without macular edema: Secondary | ICD-10-CM | POA: Diagnosis not present

## 2018-02-18 DIAGNOSIS — M1991 Primary osteoarthritis, unspecified site: Secondary | ICD-10-CM | POA: Diagnosis not present

## 2018-02-18 DIAGNOSIS — N182 Chronic kidney disease, stage 2 (mild): Secondary | ICD-10-CM | POA: Diagnosis not present

## 2018-02-18 DIAGNOSIS — S72012D Unspecified intracapsular fracture of left femur, subsequent encounter for closed fracture with routine healing: Secondary | ICD-10-CM | POA: Diagnosis not present

## 2018-02-18 DIAGNOSIS — I129 Hypertensive chronic kidney disease with stage 1 through stage 4 chronic kidney disease, or unspecified chronic kidney disease: Secondary | ICD-10-CM | POA: Diagnosis not present

## 2018-02-18 DIAGNOSIS — E559 Vitamin D deficiency, unspecified: Secondary | ICD-10-CM | POA: Diagnosis not present

## 2018-02-18 DIAGNOSIS — E1169 Type 2 diabetes mellitus with other specified complication: Secondary | ICD-10-CM | POA: Diagnosis not present

## 2018-02-18 DIAGNOSIS — I2510 Atherosclerotic heart disease of native coronary artery without angina pectoris: Secondary | ICD-10-CM | POA: Diagnosis not present

## 2018-02-23 DIAGNOSIS — E1169 Type 2 diabetes mellitus with other specified complication: Secondary | ICD-10-CM | POA: Diagnosis not present

## 2018-02-23 DIAGNOSIS — E1151 Type 2 diabetes mellitus with diabetic peripheral angiopathy without gangrene: Secondary | ICD-10-CM | POA: Diagnosis not present

## 2018-02-23 DIAGNOSIS — E559 Vitamin D deficiency, unspecified: Secondary | ICD-10-CM | POA: Diagnosis not present

## 2018-02-23 DIAGNOSIS — N182 Chronic kidney disease, stage 2 (mild): Secondary | ICD-10-CM | POA: Diagnosis not present

## 2018-02-23 DIAGNOSIS — M1991 Primary osteoarthritis, unspecified site: Secondary | ICD-10-CM | POA: Diagnosis not present

## 2018-02-23 DIAGNOSIS — I2510 Atherosclerotic heart disease of native coronary artery without angina pectoris: Secondary | ICD-10-CM | POA: Diagnosis not present

## 2018-02-23 DIAGNOSIS — E11319 Type 2 diabetes mellitus with unspecified diabetic retinopathy without macular edema: Secondary | ICD-10-CM | POA: Diagnosis not present

## 2018-02-23 DIAGNOSIS — S72012D Unspecified intracapsular fracture of left femur, subsequent encounter for closed fracture with routine healing: Secondary | ICD-10-CM | POA: Diagnosis not present

## 2018-02-23 DIAGNOSIS — E785 Hyperlipidemia, unspecified: Secondary | ICD-10-CM | POA: Diagnosis not present

## 2018-02-23 DIAGNOSIS — I129 Hypertensive chronic kidney disease with stage 1 through stage 4 chronic kidney disease, or unspecified chronic kidney disease: Secondary | ICD-10-CM | POA: Diagnosis not present

## 2018-02-23 DIAGNOSIS — E1122 Type 2 diabetes mellitus with diabetic chronic kidney disease: Secondary | ICD-10-CM | POA: Diagnosis not present

## 2018-02-26 ENCOUNTER — Telehealth: Payer: Self-pay | Admitting: *Deleted

## 2018-02-26 NOTE — Telephone Encounter (Signed)
Dr. Glori Bickers received pt's DM eye exam from Dr. Matilde Sprang and their office said "last dilated eye exam wasx 11/20/13, pt seen on 11/17/15 but declined the dilation, pt scheduled for 05/23/16 but didn't show for appt and we have not see her again"  Dr. Glori Bickers put on paper "Please let pt know, she needs DM eye exam with Dr. Matilde Sprang (let me know if referral is needed)  Called pt and advise her of eye doc's comments and Dr. Marliss Coots comments and she said she will call herself to schedule her eye exam.   FYI to Dr. Glori Bickers and letter sent for scanning

## 2018-02-27 DIAGNOSIS — E1151 Type 2 diabetes mellitus with diabetic peripheral angiopathy without gangrene: Secondary | ICD-10-CM | POA: Diagnosis not present

## 2018-02-27 DIAGNOSIS — E1169 Type 2 diabetes mellitus with other specified complication: Secondary | ICD-10-CM | POA: Diagnosis not present

## 2018-02-27 DIAGNOSIS — I2510 Atherosclerotic heart disease of native coronary artery without angina pectoris: Secondary | ICD-10-CM | POA: Diagnosis not present

## 2018-02-27 DIAGNOSIS — S72012D Unspecified intracapsular fracture of left femur, subsequent encounter for closed fracture with routine healing: Secondary | ICD-10-CM | POA: Diagnosis not present

## 2018-02-27 DIAGNOSIS — E559 Vitamin D deficiency, unspecified: Secondary | ICD-10-CM | POA: Diagnosis not present

## 2018-02-27 DIAGNOSIS — E11319 Type 2 diabetes mellitus with unspecified diabetic retinopathy without macular edema: Secondary | ICD-10-CM | POA: Diagnosis not present

## 2018-02-27 DIAGNOSIS — I129 Hypertensive chronic kidney disease with stage 1 through stage 4 chronic kidney disease, or unspecified chronic kidney disease: Secondary | ICD-10-CM | POA: Diagnosis not present

## 2018-02-27 DIAGNOSIS — E785 Hyperlipidemia, unspecified: Secondary | ICD-10-CM | POA: Diagnosis not present

## 2018-02-27 DIAGNOSIS — N182 Chronic kidney disease, stage 2 (mild): Secondary | ICD-10-CM | POA: Diagnosis not present

## 2018-02-27 DIAGNOSIS — E1122 Type 2 diabetes mellitus with diabetic chronic kidney disease: Secondary | ICD-10-CM | POA: Diagnosis not present

## 2018-02-27 DIAGNOSIS — M1991 Primary osteoarthritis, unspecified site: Secondary | ICD-10-CM | POA: Diagnosis not present

## 2018-03-03 DIAGNOSIS — E1169 Type 2 diabetes mellitus with other specified complication: Secondary | ICD-10-CM | POA: Diagnosis not present

## 2018-03-03 DIAGNOSIS — S72012D Unspecified intracapsular fracture of left femur, subsequent encounter for closed fracture with routine healing: Secondary | ICD-10-CM | POA: Diagnosis not present

## 2018-03-03 DIAGNOSIS — E11319 Type 2 diabetes mellitus with unspecified diabetic retinopathy without macular edema: Secondary | ICD-10-CM | POA: Diagnosis not present

## 2018-03-03 DIAGNOSIS — N182 Chronic kidney disease, stage 2 (mild): Secondary | ICD-10-CM | POA: Diagnosis not present

## 2018-03-03 DIAGNOSIS — I129 Hypertensive chronic kidney disease with stage 1 through stage 4 chronic kidney disease, or unspecified chronic kidney disease: Secondary | ICD-10-CM | POA: Diagnosis not present

## 2018-03-03 DIAGNOSIS — E559 Vitamin D deficiency, unspecified: Secondary | ICD-10-CM | POA: Diagnosis not present

## 2018-03-03 DIAGNOSIS — I2510 Atherosclerotic heart disease of native coronary artery without angina pectoris: Secondary | ICD-10-CM | POA: Diagnosis not present

## 2018-03-03 DIAGNOSIS — E1122 Type 2 diabetes mellitus with diabetic chronic kidney disease: Secondary | ICD-10-CM | POA: Diagnosis not present

## 2018-03-03 DIAGNOSIS — E1151 Type 2 diabetes mellitus with diabetic peripheral angiopathy without gangrene: Secondary | ICD-10-CM | POA: Diagnosis not present

## 2018-03-03 DIAGNOSIS — M1991 Primary osteoarthritis, unspecified site: Secondary | ICD-10-CM | POA: Diagnosis not present

## 2018-03-03 DIAGNOSIS — E785 Hyperlipidemia, unspecified: Secondary | ICD-10-CM | POA: Diagnosis not present

## 2018-03-04 DIAGNOSIS — S72002D Fracture of unspecified part of neck of left femur, subsequent encounter for closed fracture with routine healing: Secondary | ICD-10-CM | POA: Diagnosis not present

## 2018-03-05 DIAGNOSIS — E1169 Type 2 diabetes mellitus with other specified complication: Secondary | ICD-10-CM | POA: Diagnosis not present

## 2018-03-05 DIAGNOSIS — S72012D Unspecified intracapsular fracture of left femur, subsequent encounter for closed fracture with routine healing: Secondary | ICD-10-CM | POA: Diagnosis not present

## 2018-03-05 DIAGNOSIS — E1122 Type 2 diabetes mellitus with diabetic chronic kidney disease: Secondary | ICD-10-CM | POA: Diagnosis not present

## 2018-03-05 DIAGNOSIS — E559 Vitamin D deficiency, unspecified: Secondary | ICD-10-CM | POA: Diagnosis not present

## 2018-03-05 DIAGNOSIS — E11319 Type 2 diabetes mellitus with unspecified diabetic retinopathy without macular edema: Secondary | ICD-10-CM | POA: Diagnosis not present

## 2018-03-05 DIAGNOSIS — I129 Hypertensive chronic kidney disease with stage 1 through stage 4 chronic kidney disease, or unspecified chronic kidney disease: Secondary | ICD-10-CM | POA: Diagnosis not present

## 2018-03-05 DIAGNOSIS — M1991 Primary osteoarthritis, unspecified site: Secondary | ICD-10-CM | POA: Diagnosis not present

## 2018-03-05 DIAGNOSIS — I2510 Atherosclerotic heart disease of native coronary artery without angina pectoris: Secondary | ICD-10-CM | POA: Diagnosis not present

## 2018-03-05 DIAGNOSIS — E785 Hyperlipidemia, unspecified: Secondary | ICD-10-CM | POA: Diagnosis not present

## 2018-03-05 DIAGNOSIS — N182 Chronic kidney disease, stage 2 (mild): Secondary | ICD-10-CM | POA: Diagnosis not present

## 2018-03-05 DIAGNOSIS — E1151 Type 2 diabetes mellitus with diabetic peripheral angiopathy without gangrene: Secondary | ICD-10-CM | POA: Diagnosis not present

## 2018-03-06 ENCOUNTER — Other Ambulatory Visit: Payer: Medicare Other

## 2018-03-06 ENCOUNTER — Telehealth: Payer: Self-pay | Admitting: *Deleted

## 2018-03-06 NOTE — Telephone Encounter (Signed)
Opened in error to place a future IFob order. Not our pt.

## 2018-03-09 DIAGNOSIS — E1169 Type 2 diabetes mellitus with other specified complication: Secondary | ICD-10-CM | POA: Diagnosis not present

## 2018-03-09 DIAGNOSIS — M1991 Primary osteoarthritis, unspecified site: Secondary | ICD-10-CM | POA: Diagnosis not present

## 2018-03-09 DIAGNOSIS — I2510 Atherosclerotic heart disease of native coronary artery without angina pectoris: Secondary | ICD-10-CM | POA: Diagnosis not present

## 2018-03-09 DIAGNOSIS — S72012D Unspecified intracapsular fracture of left femur, subsequent encounter for closed fracture with routine healing: Secondary | ICD-10-CM | POA: Diagnosis not present

## 2018-03-09 DIAGNOSIS — N182 Chronic kidney disease, stage 2 (mild): Secondary | ICD-10-CM | POA: Diagnosis not present

## 2018-03-09 DIAGNOSIS — I129 Hypertensive chronic kidney disease with stage 1 through stage 4 chronic kidney disease, or unspecified chronic kidney disease: Secondary | ICD-10-CM | POA: Diagnosis not present

## 2018-03-09 DIAGNOSIS — E11319 Type 2 diabetes mellitus with unspecified diabetic retinopathy without macular edema: Secondary | ICD-10-CM | POA: Diagnosis not present

## 2018-03-09 DIAGNOSIS — E785 Hyperlipidemia, unspecified: Secondary | ICD-10-CM | POA: Diagnosis not present

## 2018-03-09 DIAGNOSIS — E1151 Type 2 diabetes mellitus with diabetic peripheral angiopathy without gangrene: Secondary | ICD-10-CM | POA: Diagnosis not present

## 2018-03-09 DIAGNOSIS — E1122 Type 2 diabetes mellitus with diabetic chronic kidney disease: Secondary | ICD-10-CM | POA: Diagnosis not present

## 2018-03-09 DIAGNOSIS — E559 Vitamin D deficiency, unspecified: Secondary | ICD-10-CM | POA: Diagnosis not present

## 2018-03-10 ENCOUNTER — Other Ambulatory Visit (INDEPENDENT_AMBULATORY_CARE_PROVIDER_SITE_OTHER): Payer: Medicare Other

## 2018-03-10 ENCOUNTER — Other Ambulatory Visit: Payer: Medicare Other

## 2018-03-10 ENCOUNTER — Other Ambulatory Visit: Payer: Self-pay | Admitting: Family Medicine

## 2018-03-10 DIAGNOSIS — Z1211 Encounter for screening for malignant neoplasm of colon: Secondary | ICD-10-CM

## 2018-03-10 LAB — FECAL OCCULT BLOOD, IMMUNOCHEMICAL: FECAL OCCULT BLD: POSITIVE — AB

## 2018-03-12 ENCOUNTER — Telehealth: Payer: Self-pay | Admitting: Family Medicine

## 2018-03-12 DIAGNOSIS — S72012D Unspecified intracapsular fracture of left femur, subsequent encounter for closed fracture with routine healing: Secondary | ICD-10-CM | POA: Diagnosis not present

## 2018-03-12 DIAGNOSIS — N182 Chronic kidney disease, stage 2 (mild): Secondary | ICD-10-CM | POA: Diagnosis not present

## 2018-03-12 DIAGNOSIS — M1991 Primary osteoarthritis, unspecified site: Secondary | ICD-10-CM | POA: Diagnosis not present

## 2018-03-12 DIAGNOSIS — I2510 Atherosclerotic heart disease of native coronary artery without angina pectoris: Secondary | ICD-10-CM | POA: Diagnosis not present

## 2018-03-12 DIAGNOSIS — I129 Hypertensive chronic kidney disease with stage 1 through stage 4 chronic kidney disease, or unspecified chronic kidney disease: Secondary | ICD-10-CM | POA: Diagnosis not present

## 2018-03-12 DIAGNOSIS — E785 Hyperlipidemia, unspecified: Secondary | ICD-10-CM | POA: Diagnosis not present

## 2018-03-12 DIAGNOSIS — E11319 Type 2 diabetes mellitus with unspecified diabetic retinopathy without macular edema: Secondary | ICD-10-CM | POA: Diagnosis not present

## 2018-03-12 DIAGNOSIS — E1151 Type 2 diabetes mellitus with diabetic peripheral angiopathy without gangrene: Secondary | ICD-10-CM | POA: Diagnosis not present

## 2018-03-12 DIAGNOSIS — E559 Vitamin D deficiency, unspecified: Secondary | ICD-10-CM | POA: Diagnosis not present

## 2018-03-12 DIAGNOSIS — R195 Other fecal abnormalities: Secondary | ICD-10-CM | POA: Insufficient documentation

## 2018-03-12 DIAGNOSIS — E1122 Type 2 diabetes mellitus with diabetic chronic kidney disease: Secondary | ICD-10-CM | POA: Diagnosis not present

## 2018-03-12 DIAGNOSIS — E1169 Type 2 diabetes mellitus with other specified complication: Secondary | ICD-10-CM | POA: Diagnosis not present

## 2018-03-12 NOTE — Telephone Encounter (Signed)
-----   Message from [place], [place] sent at 03/12/2018  9:56 AM EDT ----- Pt agreed with referral to GI but she said she can't go anytime soon, when I asked when did she think she can go she said not until Oct or Nov

## 2018-03-12 NOTE — Telephone Encounter (Signed)
I put the referral in  Will route to [facility]

## 2018-03-13 ENCOUNTER — Encounter: Payer: Self-pay | Admitting: Gastroenterology

## 2018-03-13 NOTE — Telephone Encounter (Signed)
Placed on [facility] [employer], patient is aware

## 2018-03-17 DIAGNOSIS — M1991 Primary osteoarthritis, unspecified site: Secondary | ICD-10-CM | POA: Diagnosis not present

## 2018-03-17 DIAGNOSIS — E1151 Type 2 diabetes mellitus with diabetic peripheral angiopathy without gangrene: Secondary | ICD-10-CM | POA: Diagnosis not present

## 2018-03-17 DIAGNOSIS — I129 Hypertensive chronic kidney disease with stage 1 through stage 4 chronic kidney disease, or unspecified chronic kidney disease: Secondary | ICD-10-CM | POA: Diagnosis not present

## 2018-03-17 DIAGNOSIS — N182 Chronic kidney disease, stage 2 (mild): Secondary | ICD-10-CM | POA: Diagnosis not present

## 2018-03-17 DIAGNOSIS — S72012D Unspecified intracapsular fracture of left femur, subsequent encounter for closed fracture with routine healing: Secondary | ICD-10-CM | POA: Diagnosis not present

## 2018-03-17 DIAGNOSIS — I2510 Atherosclerotic heart disease of native coronary artery without angina pectoris: Secondary | ICD-10-CM | POA: Diagnosis not present

## 2018-03-17 DIAGNOSIS — E1169 Type 2 diabetes mellitus with other specified complication: Secondary | ICD-10-CM | POA: Diagnosis not present

## 2018-03-17 DIAGNOSIS — E11319 Type 2 diabetes mellitus with unspecified diabetic retinopathy without macular edema: Secondary | ICD-10-CM | POA: Diagnosis not present

## 2018-03-17 DIAGNOSIS — E559 Vitamin D deficiency, unspecified: Secondary | ICD-10-CM | POA: Diagnosis not present

## 2018-03-17 DIAGNOSIS — E1122 Type 2 diabetes mellitus with diabetic chronic kidney disease: Secondary | ICD-10-CM | POA: Diagnosis not present

## 2018-03-17 DIAGNOSIS — E785 Hyperlipidemia, unspecified: Secondary | ICD-10-CM | POA: Diagnosis not present

## 2018-03-19 DIAGNOSIS — E559 Vitamin D deficiency, unspecified: Secondary | ICD-10-CM | POA: Diagnosis not present

## 2018-03-19 DIAGNOSIS — M1991 Primary osteoarthritis, unspecified site: Secondary | ICD-10-CM | POA: Diagnosis not present

## 2018-03-19 DIAGNOSIS — E1169 Type 2 diabetes mellitus with other specified complication: Secondary | ICD-10-CM | POA: Diagnosis not present

## 2018-03-19 DIAGNOSIS — S72012D Unspecified intracapsular fracture of left femur, subsequent encounter for closed fracture with routine healing: Secondary | ICD-10-CM | POA: Diagnosis not present

## 2018-03-19 DIAGNOSIS — E11319 Type 2 diabetes mellitus with unspecified diabetic retinopathy without macular edema: Secondary | ICD-10-CM | POA: Diagnosis not present

## 2018-03-19 DIAGNOSIS — I129 Hypertensive chronic kidney disease with stage 1 through stage 4 chronic kidney disease, or unspecified chronic kidney disease: Secondary | ICD-10-CM | POA: Diagnosis not present

## 2018-03-19 DIAGNOSIS — N182 Chronic kidney disease, stage 2 (mild): Secondary | ICD-10-CM | POA: Diagnosis not present

## 2018-03-19 DIAGNOSIS — E1151 Type 2 diabetes mellitus with diabetic peripheral angiopathy without gangrene: Secondary | ICD-10-CM | POA: Diagnosis not present

## 2018-03-19 DIAGNOSIS — I2510 Atherosclerotic heart disease of native coronary artery without angina pectoris: Secondary | ICD-10-CM | POA: Diagnosis not present

## 2018-03-19 DIAGNOSIS — E1122 Type 2 diabetes mellitus with diabetic chronic kidney disease: Secondary | ICD-10-CM | POA: Diagnosis not present

## 2018-03-19 DIAGNOSIS — E785 Hyperlipidemia, unspecified: Secondary | ICD-10-CM | POA: Diagnosis not present

## 2018-03-23 DIAGNOSIS — E1151 Type 2 diabetes mellitus with diabetic peripheral angiopathy without gangrene: Secondary | ICD-10-CM | POA: Diagnosis not present

## 2018-03-23 DIAGNOSIS — M1991 Primary osteoarthritis, unspecified site: Secondary | ICD-10-CM | POA: Diagnosis not present

## 2018-03-23 DIAGNOSIS — E11319 Type 2 diabetes mellitus with unspecified diabetic retinopathy without macular edema: Secondary | ICD-10-CM | POA: Diagnosis not present

## 2018-03-23 DIAGNOSIS — I2510 Atherosclerotic heart disease of native coronary artery without angina pectoris: Secondary | ICD-10-CM | POA: Diagnosis not present

## 2018-03-23 DIAGNOSIS — E1122 Type 2 diabetes mellitus with diabetic chronic kidney disease: Secondary | ICD-10-CM | POA: Diagnosis not present

## 2018-03-23 DIAGNOSIS — E1169 Type 2 diabetes mellitus with other specified complication: Secondary | ICD-10-CM | POA: Diagnosis not present

## 2018-03-23 DIAGNOSIS — I129 Hypertensive chronic kidney disease with stage 1 through stage 4 chronic kidney disease, or unspecified chronic kidney disease: Secondary | ICD-10-CM | POA: Diagnosis not present

## 2018-03-23 DIAGNOSIS — N182 Chronic kidney disease, stage 2 (mild): Secondary | ICD-10-CM | POA: Diagnosis not present

## 2018-03-23 DIAGNOSIS — E559 Vitamin D deficiency, unspecified: Secondary | ICD-10-CM | POA: Diagnosis not present

## 2018-03-23 DIAGNOSIS — E785 Hyperlipidemia, unspecified: Secondary | ICD-10-CM | POA: Diagnosis not present

## 2018-03-23 DIAGNOSIS — S72012D Unspecified intracapsular fracture of left femur, subsequent encounter for closed fracture with routine healing: Secondary | ICD-10-CM | POA: Diagnosis not present

## 2018-03-26 DIAGNOSIS — E1122 Type 2 diabetes mellitus with diabetic chronic kidney disease: Secondary | ICD-10-CM | POA: Diagnosis not present

## 2018-03-26 DIAGNOSIS — E559 Vitamin D deficiency, unspecified: Secondary | ICD-10-CM | POA: Diagnosis not present

## 2018-03-26 DIAGNOSIS — E785 Hyperlipidemia, unspecified: Secondary | ICD-10-CM | POA: Diagnosis not present

## 2018-03-26 DIAGNOSIS — E11319 Type 2 diabetes mellitus with unspecified diabetic retinopathy without macular edema: Secondary | ICD-10-CM | POA: Diagnosis not present

## 2018-03-26 DIAGNOSIS — E1151 Type 2 diabetes mellitus with diabetic peripheral angiopathy without gangrene: Secondary | ICD-10-CM | POA: Diagnosis not present

## 2018-03-26 DIAGNOSIS — I129 Hypertensive chronic kidney disease with stage 1 through stage 4 chronic kidney disease, or unspecified chronic kidney disease: Secondary | ICD-10-CM | POA: Diagnosis not present

## 2018-03-26 DIAGNOSIS — I2510 Atherosclerotic heart disease of native coronary artery without angina pectoris: Secondary | ICD-10-CM | POA: Diagnosis not present

## 2018-03-26 DIAGNOSIS — M1991 Primary osteoarthritis, unspecified site: Secondary | ICD-10-CM | POA: Diagnosis not present

## 2018-03-26 DIAGNOSIS — N182 Chronic kidney disease, stage 2 (mild): Secondary | ICD-10-CM | POA: Diagnosis not present

## 2018-03-26 DIAGNOSIS — E1169 Type 2 diabetes mellitus with other specified complication: Secondary | ICD-10-CM | POA: Diagnosis not present

## 2018-03-26 DIAGNOSIS — S72012D Unspecified intracapsular fracture of left femur, subsequent encounter for closed fracture with routine healing: Secondary | ICD-10-CM | POA: Diagnosis not present

## 2018-04-06 DIAGNOSIS — S72002D Fracture of unspecified part of neck of left femur, subsequent encounter for closed fracture with routine healing: Secondary | ICD-10-CM | POA: Diagnosis not present

## 2018-04-27 ENCOUNTER — Other Ambulatory Visit: Payer: Self-pay | Admitting: Family Medicine

## 2018-04-28 ENCOUNTER — Telehealth: Payer: Self-pay | Admitting: Emergency Medicine

## 2018-04-28 ENCOUNTER — Ambulatory Visit: Payer: Medicare Other | Admitting: Gastroenterology

## 2018-04-28 ENCOUNTER — Encounter (INDEPENDENT_AMBULATORY_CARE_PROVIDER_SITE_OTHER): Payer: Self-pay

## 2018-04-28 ENCOUNTER — Encounter: Payer: Self-pay | Admitting: Gastroenterology

## 2018-04-28 VITALS — BP 120/54 | HR 64 | Ht 61.0 in | Wt 111.4 lb

## 2018-04-28 DIAGNOSIS — R195 Other fecal abnormalities: Secondary | ICD-10-CM

## 2018-04-28 MED ORDER — FAMOTIDINE 40 MG PO TABS: 40.0000 mg | ORAL_TABLET | Freq: Every day | ORAL | 3 refills | Status: AC

## 2018-04-28 NOTE — Telephone Encounter (Signed)
Dr. Alba Cory,   This patient is scheduled for a colonoscopy on 05-13-18. She is currently taking Aggrenox. Is patient stable enough to hold Aggrenox 7 days before procedure?  Thanks,   Tinnie Gens, [place]

## 2018-04-28 NOTE — Progress Notes (Signed)
Referring Provider: Abner Greenspan, MD Primary Care Physician:  Tower, Wynelle Fanny, MD  Reason for Consultation:  Blood in my stool   IMPRESSION:  Fecal occult blood test positive External hemorrhoid CAD s/p stent placement, recently on Aggranox Recent anemia that patient attributes to hip fracture Reflux controlled on ranitidine 150 mg daily No prior endoscopic evauation Years of negative FOBT as her primary means of colon cancer screening  Hemoccult positive stools without overt GI blood loss. Discussed evaluation options including no action with the associated risk for undiagnosed polyps or mass, endoscopic evaluation with colonoscopy, and CT colonography that may need to followed-up with endoscopy. 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 After our discussion, she would like to proceed with colonoscopy.   Her medication list includes Aggrenox. We will reach out to the prescribing physician and clarify as she would need a washout prior to proceeding with colonoscopy if she is still taking the medication.  PLAN: Colonoscopy Famotidine 40 mg daily instead of her ranitidine 150 mg daily  I consented the patient at the bedside today discussing the risks, benefits, and alternatives to endoscopic evaluation. In particular, we discussed the risks that include, but are not limited to, reaction to medication, cardiopulmonary compromise, bleeding requiring blood transfusion, aspiration resulting in pneumonia, perforation requiring surgery, and even death. We reviewed the risk of missed lesion including polyps or even cancer. The patient acknowledges these risks and asks that we proceed.  I recommended a switch from  ranitidine to famotidine given the recent FDA warning about the detection of low levels of [place] in ranitidine medicines. She declined a famotidine prescription.    HPI: Stephanie [name] is a 84  y.o. female seen in consultation at the request of Dr. Glori Bickers for further evaluation of a positive fecal occult blood test.  The history is obtained through the patient and review of her electronic health record during a well visit in August 2019 the patient was offered colon cancer screening. She is a retired Marine scientist.  Fecal occult blood test was + March 10, 2018.  The patient initially declined referral to GI because she felt this was due to a large hemorrhoid.  No overt bleeding, melena, bright red blood per rectum, or hematochezia. No associated symptoms. No identified exacerbating or relieving features.   She uses ranitidine for heartburn/indigestion daily. She has no breakthrough symptoms.   Labs from July 2019 show a hemoglobin of 12.8, MCV 91.4, RDW 13.7, platelets 424. This blood was obtained after her hip fracture when she required two units of PRBCs due to bleeding.   No prior colon cancer beyond stool cards. Prior to this year they have been negative.  No known family history of colon cancer or polyps.    Past Medical History:  Diagnosis Date  . Allergy    allergic rhinitis  . Anxiety   . Arthritis    osteoarthritis  . Degenerative disk disease    in neck  . Diabetes mellitus    type II  . Fracture of femoral neck, left ([place]) 01/21/2018  . Hyperlipidemia   . Hypertension   . Hypoaldosteronism ([place]) 6/12   from DM causing hyperkalemia   . Osteopenia   . Periodontal disease   . Peripheral vascular disease ([place])   . Transient cerebral ischemia 11/06/2016    Past Surgical History:  Procedure Laterality Date  . CARDIAC CATHETERIZATION     x2  STENT  . CHOLECYSTECTOMY    . ESOPHAGOGASTRODUODENOSCOPY  03/2004   gastritis by biopsy  . GUM SURGERY  06/2010   laser surgery for gums  . HIP ARTHROPLASTY Left 01/21/2018   Procedure: ARTHROPLASTY BIPOLAR HIP (HEMIARTHROPLASTY);  Surgeon: Marchia Bond, MD;  Location: [facility];  Service: Orthopedics;  Laterality: Left;  Marland Kitchen MM BREAST  STEREO BX*L*R/S  12/12   normal  . TONSILLECTOMY    . TRANSTHORACIC ECHOCARDIOGRAM  11/2016   EF 55-60%, grade 1 DD with elevated LV end diastolic filling pressures, mildly dilated LA, mild TR and PR.   . TUBAL LIGATION      Prior to Admission medications   Medication Sig Start Date End Date Taking? Authorizing Provider  acetaminophen (TYLENOL) 325 MG tablet Take 2 tablets (650 mg total) by mouth every 6 (six) hours as needed. 01/21/18   Marchia Bond, MD  amLODipine (NORVASC) 5 MG tablet Take 1 tablet (5 mg total) by mouth daily. 02/16/18   Tower, Wynelle Fanny, MD  apixaban (ELIQUIS) 2.5 MG TABS tablet Take 1 tablet (2.5 mg total) by mouth 2 (two) times daily. For 57month 01/24/18 02/24/18  Domenic Polite, MD  atorvastatin (LIPITOR) 20 MG tablet TAKE 1 TABLET BY MOUTH ONCE DAILY 04/27/18   Tower, Wynelle Fanny, MD  chlorthalidone (HYGROTON) 25 MG tablet Take 1 tablet (25 mg total) by mouth daily. 08/27/17   Tower, Wynelle Fanny, MD  diphenhydramine-acetaminophen (TYLENOL PM EXTRA STRENGTH) 25-500 MG TABS Take 1 tablet by mouth at bedtime as needed.      [provider]  dipyridamole-aspirin (AGGRENOX) 200-25 MG 12hr capsule Take 1 capsule by mouth 2 (two) times daily. RESTART THIS IN 30days when you come off [facility] 02/24/18   Domenic Polite, MD  glipiZIDE (GLIPIZIDE XL) 10 MG 24 hr tablet TAKE 1 TABLET BY MOUTH ONCE DAILY WITH BREAKFAST 02/16/18   Tower, Wynelle Fanny, MD  metFORMIN (GLUCOPHAGE) 1000 MG tablet Take 1 tablet (1,000 mg total) by mouth 2 (two) times daily with a meal. 02/16/18   Tower, Wynelle Fanny, MD  metoprolol succinate (TOPROL-XL) 100 MG 24 hr tablet TAKE 1 TABLET BY MOUTH ONCE DAILY TAKE  WITH  OR  IMMEDIATELY  FOLLOWING  A  MEAL 02/16/18   Tower, Wynelle Fanny, MD  PARoxetine (PAXIL) 40 MG tablet Take 1 tablet (40 mg total) by mouth daily. 08/27/17   Tower, Wynelle Fanny, MD  polyethylene glycol (MIRALAX / GLYCOLAX) packet Take 17 g by mouth daily. Use home supply 01/24/18   Domenic Polite, MD  ranitidine (ZANTAC)  150 MG tablet Take 150 mg by mouth 2 (two) times daily.    [provider]    Current Outpatient Medications  Medication Sig Dispense Refill  . acetaminophen (TYLENOL) 325 MG tablet Take 2 tablets (650 mg total) by mouth every 6 (six) hours as needed. 60 tablet 1  . amLODipine (NORVASC) 5 MG tablet Take 1 tablet (5 mg total) by mouth daily. 90 tablet 3  . apixaban (ELIQUIS) 2.5 MG TABS tablet Take 1 tablet (2.5 mg total) by mouth 2 (two) times daily. For 60month 60 tablet 0  . atorvastatin (LIPITOR) 20 MG tablet TAKE 1 TABLET BY MOUTH ONCE DAILY 90 tablet 1  . chlorthalidone (HYGROTON) 25 MG tablet Take 1 tablet (25 mg total) by mouth daily. 90 tablet 3  . diphenhydramine-acetaminophen (TYLENOL PM EXTRA STRENGTH) 25-500 MG TABS Take 1 tablet by mouth at bedtime as needed.      . dipyridamole-aspirin (AGGRENOX) 200-25 MG 12hr capsule  Take 1 capsule by mouth 2 (two) times daily. RESTART THIS IN 30days when you come off ELIQUIS    . glipiZIDE (GLIPIZIDE XL) 10 MG 24 hr tablet TAKE 1 TABLET BY MOUTH ONCE DAILY WITH BREAKFAST 90 tablet 3  . metFORMIN (GLUCOPHAGE) 1000 MG tablet Take 1 tablet (1,000 mg total) by mouth 2 (two) times daily with a meal. 180 tablet 3  . metoprolol succinate (TOPROL-XL) 100 MG 24 hr tablet TAKE 1 TABLET BY MOUTH ONCE DAILY TAKE  WITH  OR  IMMEDIATELY  FOLLOWING  A  MEAL 90 tablet 3  . PARoxetine (PAXIL) 40 MG tablet Take 1 tablet (40 mg total) by mouth daily. 90 tablet 3  . polyethylene glycol (MIRALAX / GLYCOLAX) packet Take 17 g by mouth daily. Use home supply  0  . ranitidine (ZANTAC) 150 MG tablet Take 150 mg by mouth 2 (two) times daily.     No current facility-administered medications for this visit.     Allergies as of 04/28/2018 - Review Complete 02/16/2018  Allergen Reaction Noted  . Ace inhibitors Other (See Comments) 12/19/2006  . Amlodipine besy-benazepril hcl Other (See Comments) 12/19/2006  . Clopidogrel bisulfate Other (See Comments) 12/19/2006    . Erythromycin Other (See Comments) 12/19/2006    Family History  Problem Relation Age of Onset  . Cancer Mother        vaginal CA in situ  . Hypertension Mother   . Heart disease Mother        CAD and MI  . Heart disease Father        CAD  . Diabetes Father   . Heart disease Son        congenital valve problem    Social History   Socioeconomic History  . Marital status: Married    Spouse name: Not on file  . Number of children: Not on file  . Years of education: Not on file  . Highest education level: Not on file  Occupational History  . Not on file  Social Needs  . Financial resource strain: Not on file  . Food insecurity:    Worry: Not on file    Inability: Not on file  . Transportation needs:    Medical: Not on file    Non-medical: Not on file  Tobacco Use  . Smoking status: Never Smoker  . Smokeless tobacco: Never Used  Substance and Sexual Activity  . Alcohol use: No    Alcohol/week: 0.0 standard drinks  . Drug use: No  . Sexual activity: Not on file  Lifestyle  . Physical activity:    Days per week: Not on file    Minutes per session: Not on file  . Stress: Not on file  Relationships  . Social connections:    Talks on phone: Not on file    Gets together: Not on file    Attends religious service: Not on file    Active member of club or organization: Not on file    Attends meetings of clubs or organizations: Not on file    Relationship status: Not on file  . Intimate partner violence:    Fear of current or ex partner: Not on file    Emotionally abused: Not on file    Physically abused: Not on file    Forced sexual activity: Not on file  Other Topics Concern  . Not on file  Social History Narrative  . Not on file    Review of Systems: 12  system ROS is negative except as noted above with the additions of fatigue, urinary leakage, sinus/allergy trouble, and her left hip fracture 01/2018.   Physical Exam: @VSRANGES @   General:   Alert,   Well-developed, well-nourished, pleasant and cooperative in NAD Head:  Normocephalic and atraumatic. Eyes:  Sclera clear, no icterus.   Conjunctiva pink. Mouth:  No deformity or lesions.   Neck:  Supple; no masses or thyromegaly. Lungs:  Clear throughout to auscultation.   No wheezes, crackles, or rhonchi.  Heart:  Regular rate and rhythm; no murmurs, clicks, rubs,  or gallops. Abdomen:  Soft,nontender, normal bowel sounds, no rebound or guarding Rectal:  Deferred to the time of endoscopy Msk:  Symmetrical without gross deformities. Pulses:  Normal pulses noted. Extremities:  Without clubbing or edema. Neurologic:  Alert and  oriented x4;  grossly normal neurologically. Skin:  Intact without significant lesions or rashes. Psych:  Alert and cooperative. Normal mood and affect. Thornton Park, MD, MPH [facility]

## 2018-04-28 NOTE — Patient Instructions (Signed)
You have been scheduled for a colonoscopy. Please follow written instructions given to you at your visit today.  Please pick up your prep supplies at the pharmacy within the next 1-3 days. If you use inhalers (even only as needed), please bring them with you on the day of your procedure. Your physician has requested that you go to www.startemmi.com and enter the access code given to you at your visit today. This web site gives a general overview about your procedure. However, you should still follow specific instructions given to you by our office regarding your preparation for the procedure.  We have sent the following medications to your pharmacy for you to pick up at your convenience: Famotidine 40 mg daily  Stop Ranitidine.

## 2018-04-29 NOTE — Telephone Encounter (Signed)
I will forward to Dr Rockey Situ her cardiologist- would like his input  Pt had positive colon cancer screening and needs a colonoscopy   Would appreciate any input Thanks

## 2018-04-30 ENCOUNTER — Encounter: Payer: Self-pay | Admitting: Gastroenterology

## 2018-05-02 NOTE — Telephone Encounter (Signed)
I would keep the aggrenox going Asa dose is only 50 mg Given her previous stents, would be high risk to stop, and dose is low. thx Brantley Fling

## 2018-05-04 NOTE — Telephone Encounter (Signed)
Left message with patients husband to call office.

## 2018-05-04 NOTE — Telephone Encounter (Signed)
See note from cardiology- does not recommend coming off of aggrenox

## 2018-05-04 NOTE — Telephone Encounter (Signed)
Routed to [facility], I see you were working on this, please see recommendations about scheduling. Thank you.

## 2018-05-04 NOTE — Telephone Encounter (Signed)
Please let the patient know that her cardiologist does not want [place] to stop her Aggrenox. This increases her risk for bleeding complications during the procedure as we discussed during her recent office visit. It is okay to schedule at this time. You may offer her an office visit to discuss further if she prefers. Thank you.

## 2018-05-05 NOTE — Telephone Encounter (Signed)
Spoke to patients husband and informed him that patient does not need to hold Aggrenox but she will have an increased risk of bleeding during her procedure. Patients husband verbalized understanding and will inform patient.

## 2018-05-07 ENCOUNTER — Ambulatory Visit (INDEPENDENT_AMBULATORY_CARE_PROVIDER_SITE_OTHER): Payer: Medicare Other

## 2018-05-07 DIAGNOSIS — Z23 Encounter for immunization: Secondary | ICD-10-CM

## 2018-05-13 ENCOUNTER — Encounter: Payer: Self-pay | Admitting: Gastroenterology

## 2018-05-13 ENCOUNTER — Ambulatory Visit (AMBULATORY_SURGERY_CENTER): Payer: Medicare Other | Admitting: Gastroenterology

## 2018-05-13 VITALS — BP 160/64 | HR 84 | Temp 95.3°F | Resp 12 | Ht 61.0 in | Wt 111.0 lb

## 2018-05-13 DIAGNOSIS — D123 Benign neoplasm of transverse colon: Secondary | ICD-10-CM

## 2018-05-13 DIAGNOSIS — D124 Benign neoplasm of descending colon: Secondary | ICD-10-CM | POA: Diagnosis not present

## 2018-05-13 DIAGNOSIS — K633 Ulcer of intestine: Secondary | ICD-10-CM | POA: Diagnosis not present

## 2018-05-13 DIAGNOSIS — R195 Other fecal abnormalities: Secondary | ICD-10-CM | POA: Diagnosis not present

## 2018-05-13 DIAGNOSIS — D120 Benign neoplasm of cecum: Secondary | ICD-10-CM

## 2018-05-13 DIAGNOSIS — K626 Ulcer of anus and rectum: Secondary | ICD-10-CM | POA: Diagnosis not present

## 2018-05-13 DIAGNOSIS — K635 Polyp of colon: Secondary | ICD-10-CM

## 2018-05-13 MED ORDER — SODIUM CHLORIDE 0.9 % IV SOLN: 500.0000 mL | Freq: Once | INTRAVENOUS | Status: DC

## 2018-05-13 NOTE — Progress Notes (Signed)
PT taken to PACU. Monitors in place. VSS. Report given to RN. 

## 2018-05-13 NOTE — Progress Notes (Signed)
Called to room to assist during endoscopic procedure.  Patient ID and intended procedure confirmed with present staff. Received instructions for my participation in the procedure from the performing physician.  

## 2018-05-13 NOTE — Patient Instructions (Signed)
   INFORMATION ON POLYPS & DIVERTICULOSIS GIVEN TO YOU TODAY   AWAIT PATHOLOGY RESULTS ON POLYPS REMOVED AND BIOPSIES TAKEN  ADD A DAILY STOOL BULKING AGENT SUCH AS METAMUCIL OR BENEFIBER AS DIRECTED ( OVER THE COUNTER)    YOU HAD AN ENDOSCOPIC PROCEDURE TODAY AT [facility]:   Refer to the procedure report that was given to you for any specific questions about what was found during the examination.  If the procedure report does not answer your questions, please call your gastroenterologist to clarify.  If you requested that your care partner not be given the details of your procedure findings, then the procedure report has been included in a sealed envelope for you to review at your convenience later.  YOU SHOULD EXPECT: Some feelings of bloating in the abdomen. Passage of more gas than usual.  Walking can help get rid of the air that was put into your GI tract during the procedure and reduce the bloating. If you had a lower endoscopy (such as a colonoscopy or flexible sigmoidoscopy) you may notice spotting of blood in your stool or on the toilet paper. If you underwent a bowel prep for your procedure, you may not have a normal bowel movement for a few days.  Please Note:  You might notice some irritation and congestion in your nose or some drainage.  This is from the oxygen used during your procedure.  There is no need for concern and it should clear up in a day or so.  SYMPTOMS TO REPORT IMMEDIATELY:   Following lower endoscopy (colonoscopy or flexible sigmoidoscopy):  Excessive amounts of blood in the stool  Significant tenderness or worsening of abdominal pains  Swelling of the abdomen that is new, acute  Fever of 100F or higher   For urgent or emergent issues, a gastroenterologist can be reached at any hour by calling [phone].   DIET:  We do recommend a small meal at first, but then you may proceed to your regular diet.  Drink plenty of fluids but you should  avoid alcoholic beverages for 24 hours.  ACTIVITY:  You should plan to take it easy for the rest of today and you should NOT DRIVE or use heavy machinery until tomorrow (because of the sedation medicines used during the test).    FOLLOW UP: Our staff will call the number listed on your records the next business day following your procedure to check on you and address any questions or concerns that you may have regarding the information given to you following your procedure. If we do not reach you, we will leave a message.  However, if you are feeling well and you are not experiencing any problems, there is no need to return our call.  We will assume that you have returned to your regular daily activities without incident.  If any biopsies were taken you will be contacted by phone or by letter within the next 1-3 weeks.  Please call us at [phone] if you have not heard about the biopsies in 3 weeks.    SIGNATURES/CONFIDENTIALITY: You and/or your care partner have signed paperwork which will be entered into your electronic medical record.  These signatures attest to the fact that that the information above on your After Visit Summary has been reviewed and is understood.  Full responsibility of the confidentiality of this discharge information lies with you and/or your care-partner.

## 2018-05-13 NOTE — Op Note (Signed)
[facility] Patient Name: Stephanie [name] Procedure Date: 05/13/2018 10:43 AM MRN: [id number] Endoscopist: Thornton Park MD, MD Age: 84 Referring MD:  Date of Birth: 04-19-1934 Gender: Female Account #: [id number] Procedure:                Colonoscopy Indications:              Heme positive stool. Medicines:                See the Anesthesia note for documentation of the                            administered medications Procedure:                Pre-Anesthesia Assessment:                           - Prior to the procedure, a History and Physical                            was performed, and patient medications and                            allergies were reviewed. The patient's tolerance of                            previous anesthesia was also reviewed. The risks                            and benefits of the procedure and the sedation                            options and risks were discussed with the patient.                            All questions were answered, and informed consent                            was obtained. Anticoagulants: The patient has taken                            antiplatelet medication. It was decided not to                            withhold this medication prior to the procedure.                            ASA Grade Assessment: III - A patient with severe                            systemic disease. After reviewing the risks and                            benefits, the patient was deemed in satisfactory  condition to undergo the procedure.                           After obtaining informed consent, the colonoscope                            was passed under direct vision. Throughout the                            procedure, the patient's blood pressure, pulse, and                            oxygen saturations were monitored continuously. The                            Colonoscope was introduced through the anus  and                            advanced to the the terminal ileum, with                            identification of the appendiceal orifice and IC                            valve. The colonoscopy was performed without                            difficulty. The patient tolerated the procedure                            well. The quality of the bowel preparation was good. Scope In: 10:51:01 AM Scope Out: 11:12:06 AM Scope Withdrawal Time: 0 hours 13 minutes 6 seconds  Total Procedure Duration: 0 hours 21 minutes 5 seconds  Findings:                 The perianal and digital rectal examinations were                            normal.                           Multiple small and large-mouthed diverticula were                            found in the sigmoid colon, descending colon and                            ascending colon.                           Two sessile polyps were found in the hepatic                            flexure. The polyps were 3 to 4 mm in size. These  polyps were removed with a cold snare. Resection                            and retrieval were complete.                           Two sessile polyps were found in the descending                            colon and cecum. The polyps were 2 to 3 mm in size.                            These polyps were removed with a cold biopsy                            forceps. Resection and retrieval were complete.                           A single (solitary) ulcer was found in the rectum.                            No bleeding was present. No stigmata of recent                            bleeding were seen. Biopsies were taken with a cold                            forceps for histology. Estimated blood loss was                            minimal.                           The exam was otherwise without abnormality on                            direct and retroflexion views. Complications:            No  immediate complications. Estimated blood loss:                            Minimal. Estimated Blood Loss:     Estimated blood loss was minimal. Impression:               - Diverticulosis in the sigmoid colon, in the                            descending colon and in the ascending colon.                           - Two 3 to 4 mm polyps at the hepatic flexure,                            removed with a cold snare. Resected and retrieved.                           -  Two 2 to 3 mm polyps in the descending colon and                            in the cecum, removed with a cold biopsy forceps.                            Resected and retrieved.                           - A single (solitary) ulcer in the rectum. Biopsied.                           - The examination was otherwise normal on direct                            and retroflexion views. Recommendation:           - Discharge patient to home.                           - Resume previous diet today. High-fiber diet                            recommended.                           - Continue present medications. Add a daily stool                            bulking agent such as Metamucil or Benefiber.                           - Await pathology results.                           - As Mrs. Wivell is 84 years old, no surveillance                            endoscopy recommended at this time.                           -Return to the office as needed. Thornton Park MD, MD 05/13/2018 11:19:47 AM This report has been signed electronically.

## 2018-05-14 ENCOUNTER — Telehealth: Payer: Self-pay

## 2018-05-14 NOTE — Telephone Encounter (Signed)
  Follow up Call-  Call back number 05/13/2018  Post procedure Call Back phone  # [phone]  Permission to leave phone message Yes  Some recent data might be hidden     Patient questions:  Do you have a fever, pain , or abdominal swelling? No. Pain Score  0 *  Have you tolerated food without any problems? Yes.    Have you been able to return to your normal activities? Yes.    Do you have any questions about your discharge instructions: Diet   No. Medications  No. Follow up visit  No.  Do you have questions or concerns about your Care? No.  Actions: * If pain score is 4 or above: No action needed, pain <4.

## 2018-06-04 ENCOUNTER — Emergency Department (HOSPITAL_COMMUNITY): Payer: Medicare Other

## 2018-06-04 ENCOUNTER — Encounter (HOSPITAL_COMMUNITY): Payer: Self-pay

## 2018-06-04 ENCOUNTER — Inpatient Hospital Stay (HOSPITAL_COMMUNITY)
Admission: EM | Admit: 2018-06-04 | Discharge: 2018-06-10 | DRG: 391 | Disposition: A | Discharge status: Home Health | Payer: Medicare Other | Attending: Internal Medicine | Admitting: Internal Medicine

## 2018-06-04 DIAGNOSIS — Z681 Body mass index (BMI) 19 or less, adult: Secondary | ICD-10-CM

## 2018-06-04 DIAGNOSIS — Z808 Family history of malignant neoplasm of other organs or systems: Secondary | ICD-10-CM | POA: Diagnosis not present

## 2018-06-04 DIAGNOSIS — E119 Type 2 diabetes mellitus without complications: Secondary | ICD-10-CM

## 2018-06-04 DIAGNOSIS — E1151 Type 2 diabetes mellitus with diabetic peripheral angiopathy without gangrene: Secondary | ICD-10-CM | POA: Diagnosis present

## 2018-06-04 DIAGNOSIS — D649 Anemia, unspecified: Secondary | ICD-10-CM

## 2018-06-04 DIAGNOSIS — E43 Unspecified severe protein-calorie malnutrition: Secondary | ICD-10-CM | POA: Diagnosis present

## 2018-06-04 DIAGNOSIS — Z7984 Long term (current) use of oral hypoglycemic drugs: Secondary | ICD-10-CM | POA: Diagnosis not present

## 2018-06-04 DIAGNOSIS — F419 Anxiety disorder, unspecified: Secondary | ICD-10-CM | POA: Diagnosis present

## 2018-06-04 DIAGNOSIS — K921 Melena: Secondary | ICD-10-CM | POA: Diagnosis present

## 2018-06-04 DIAGNOSIS — F329 Major depressive disorder, single episode, unspecified: Secondary | ICD-10-CM | POA: Diagnosis present

## 2018-06-04 DIAGNOSIS — Z888 Allergy status to other drugs, medicaments and biological substances status: Secondary | ICD-10-CM | POA: Diagnosis not present

## 2018-06-04 DIAGNOSIS — N179 Acute kidney failure, unspecified: Secondary | ICD-10-CM | POA: Diagnosis not present

## 2018-06-04 DIAGNOSIS — E876 Hypokalemia: Secondary | ICD-10-CM | POA: Diagnosis present

## 2018-06-04 DIAGNOSIS — Z833 Family history of diabetes mellitus: Secondary | ICD-10-CM

## 2018-06-04 DIAGNOSIS — R1111 Vomiting without nausea: Secondary | ICD-10-CM | POA: Diagnosis not present

## 2018-06-04 DIAGNOSIS — R112 Nausea with vomiting, unspecified: Secondary | ICD-10-CM | POA: Diagnosis present

## 2018-06-04 DIAGNOSIS — I10 Essential (primary) hypertension: Secondary | ICD-10-CM | POA: Diagnosis not present

## 2018-06-04 DIAGNOSIS — K630 Abscess of intestine: Secondary | ICD-10-CM | POA: Diagnosis not present

## 2018-06-04 DIAGNOSIS — E46 Unspecified protein-calorie malnutrition: Secondary | ICD-10-CM

## 2018-06-04 DIAGNOSIS — N183 Chronic kidney disease, stage 3 (moderate): Secondary | ICD-10-CM | POA: Diagnosis present

## 2018-06-04 DIAGNOSIS — Z96642 Presence of left artificial hip joint: Secondary | ICD-10-CM | POA: Diagnosis present

## 2018-06-04 DIAGNOSIS — K5792 Diverticulitis of intestine, part unspecified, without perforation or abscess without bleeding: Secondary | ICD-10-CM | POA: Diagnosis present

## 2018-06-04 DIAGNOSIS — K219 Gastro-esophageal reflux disease without esophagitis: Secondary | ICD-10-CM | POA: Diagnosis not present

## 2018-06-04 DIAGNOSIS — D638 Anemia in other chronic diseases classified elsewhere: Secondary | ICD-10-CM | POA: Diagnosis present

## 2018-06-04 DIAGNOSIS — I129 Hypertensive chronic kidney disease with stage 1 through stage 4 chronic kidney disease, or unspecified chronic kidney disease: Secondary | ICD-10-CM | POA: Diagnosis present

## 2018-06-04 DIAGNOSIS — J189 Pneumonia, unspecified organism: Secondary | ICD-10-CM | POA: Diagnosis not present

## 2018-06-04 DIAGNOSIS — R197 Diarrhea, unspecified: Secondary | ICD-10-CM | POA: Diagnosis not present

## 2018-06-04 DIAGNOSIS — F32A Depression, unspecified: Secondary | ICD-10-CM | POA: Diagnosis present

## 2018-06-04 DIAGNOSIS — Z79899 Other long term (current) drug therapy: Secondary | ICD-10-CM

## 2018-06-04 DIAGNOSIS — E785 Hyperlipidemia, unspecified: Secondary | ICD-10-CM | POA: Diagnosis not present

## 2018-06-04 DIAGNOSIS — R0902 Hypoxemia: Secondary | ICD-10-CM | POA: Diagnosis not present

## 2018-06-04 DIAGNOSIS — K5780 Diverticulitis of intestine, part unspecified, with perforation and abscess without bleeding: Secondary | ICD-10-CM

## 2018-06-04 DIAGNOSIS — Z9049 Acquired absence of other specified parts of digestive tract: Secondary | ICD-10-CM | POA: Diagnosis not present

## 2018-06-04 DIAGNOSIS — K5720 Diverticulitis of large intestine with perforation and abscess without bleeding: Principal | ICD-10-CM | POA: Diagnosis present

## 2018-06-04 DIAGNOSIS — I959 Hypotension, unspecified: Secondary | ICD-10-CM | POA: Diagnosis not present

## 2018-06-04 DIAGNOSIS — K5730 Diverticulosis of large intestine without perforation or abscess without bleeding: Secondary | ICD-10-CM | POA: Diagnosis not present

## 2018-06-04 DIAGNOSIS — Z8249 Family history of ischemic heart disease and other diseases of the circulatory system: Secondary | ICD-10-CM | POA: Diagnosis not present

## 2018-06-04 DIAGNOSIS — E1122 Type 2 diabetes mellitus with diabetic chronic kidney disease: Secondary | ICD-10-CM | POA: Diagnosis not present

## 2018-06-04 DIAGNOSIS — Z86010 Personal history of colonic polyps: Secondary | ICD-10-CM

## 2018-06-04 DIAGNOSIS — Z743 Need for continuous supervision: Secondary | ICD-10-CM | POA: Diagnosis not present

## 2018-06-04 DIAGNOSIS — Z794 Long term (current) use of insulin: Secondary | ICD-10-CM

## 2018-06-04 DIAGNOSIS — R05 Cough: Secondary | ICD-10-CM | POA: Diagnosis not present

## 2018-06-04 LAB — COMPREHENSIVE METABOLIC PANEL
ALBUMIN: 2.6 g/dL — AB (ref 3.5–5.0)
ALK PHOS: 51 U/L (ref 38–126)
ALT: 10 U/L (ref 0–44)
ANION GAP: 11 (ref 5–15)
AST: 36 U/L (ref 15–41)
BUN: 35 mg/dL — ABNORMAL HIGH (ref 8–23)
CO2: 20 mmol/L — AB (ref 22–32)
Calcium: 8.2 mg/dL — ABNORMAL LOW (ref 8.9–10.3)
Chloride: 100 mmol/L (ref 98–111)
Creatinine, Ser: 1.27 mg/dL — ABNORMAL HIGH (ref 0.44–1.00)
GFR calc Af Amer: 44 mL/min — ABNORMAL LOW (ref >60)
GFR calc non Af Amer: 38 mL/min — ABNORMAL LOW (ref >60)
GLUCOSE: 147 mg/dL — AB (ref 70–99)
POTASSIUM: 3.4 mmol/L — AB (ref 3.5–5.1)
SODIUM: 131 mmol/L — AB (ref 135–145)
Total Bilirubin: 0.6 mg/dL (ref 0.3–1.2)
Total Protein: 6 g/dL — ABNORMAL LOW (ref 6.5–8.1)

## 2018-06-04 LAB — CBC WITH DIFFERENTIAL/PLATELET
ABS IMMATURE GRANULOCYTES: 0 10*3/uL (ref 0.00–0.07)
Basophils Absolute: 0 10*3/uL (ref 0.0–0.1)
Basophils Relative: 0 %
Eosinophils Absolute: 0 10*3/uL (ref 0.0–0.5)
Eosinophils Relative: 0 %
HCT: 29.5 % — ABNORMAL LOW (ref 36.0–46.0)
Hemoglobin: 9.7 g/dL — ABNORMAL LOW (ref 12.0–15.0)
LYMPHS ABS: 0.8 10*3/uL (ref 0.7–4.0)
Lymphocytes Relative: 6 %
MCH: 30.4 pg (ref 26.0–34.0)
MCHC: 32.9 g/dL (ref 30.0–36.0)
MCV: 92.5 fL (ref 80.0–100.0)
MONOS PCT: 4 %
Monocytes Absolute: 0.6 10*3/uL (ref 0.1–1.0)
NEUTROS ABS: 12.4 10*3/uL — AB (ref 1.7–7.7)
NEUTROS PCT: 90 %
NRBC: 0 /100{WBCs}
Platelets: 251 10*3/uL (ref 150–400)
RBC: 3.19 MIL/uL — ABNORMAL LOW (ref 3.87–5.11)
RDW: 12.7 % (ref 11.5–15.5)
WBC: 14.2 10*3/uL — ABNORMAL HIGH (ref 4.0–10.5)
nRBC: 0 % (ref 0.0–0.2)

## 2018-06-04 MED ORDER — PIPERACILLIN-TAZOBACTAM 3.375 G IVPB
3.3750 g | Freq: Once | INTRAVENOUS | Status: AC
  Administered 2018-06-04: 3.375 g via INTRAVENOUS
  Filled 2018-06-04: qty 50

## 2018-06-04 MED ORDER — SODIUM CHLORIDE 0.9 % IV BOLUS
1000.0000 mL | Freq: Once | INTRAVENOUS | Status: AC
  Administered 2018-06-04: 1000 mL via INTRAVENOUS

## 2018-06-04 MED ORDER — IOHEXOL 300 MG/ML  SOLN
100.0000 mL | Freq: Once | INTRAMUSCULAR | Status: AC | PRN
  Administered 2018-06-04: 100 mL via INTRAVENOUS

## 2018-06-04 NOTE — Progress Notes (Addendum)
Received patient from ED, AOx4, ambulates using FWW, VSS with soft BP at 108/55, PR 70, T97.6 oral and O2Sat at 96% on RA, oriented to room, bed controls and call light.  Text paged [facility] admission of patient's arrival to floor.  Dr. Marlowe Sax then came to see and assessed the patient.  Will monitor.

## 2018-06-04 NOTE — H&P (Signed)
History and Physical    Stephanie [name] OBS:[id number] DOB: 01-May-1934 DOA: 06/04/2018  PCP: Abner Greenspan, MD Patient coming from: Home  Chief Complaint: Nausea, vomiting  HPI: Stephanie [name] is a 84 y.o. female with medical history significant of anxiety, type 2 diabetes, hypertension, hyperlipidemia, diverticulosis presenting to the hospital via EMS for evaluation of nausea and vomiting.  Patient states she had a colonoscopy done last week and 4 polyps were removed.  She was having blood in her stool prior to the colonoscopy and it even continued afterwards.  Reports noticing a small amount of blood coating her stool intermittently and dark stools intermittently since her colonoscopy.  Also reports having intermittent nausea and vomiting.  Her oral intake is decreased.  Reports having loose stools but no diarrhea.  Denies having abdominal pain.  States she has been coughing secondary to postnasal drip from her sinus congestion.  She denies having any shortness of breath or fevers.  Reports feeling cold.  ED Course: Per EMS, orthostatics positive on scene.  Received 500 cc fluid bolus via EMS.  CBG 155.  On arrival, afebrile, not tachycardic, and blood pressure stable.  White count 14.2, was 10.7 three months ago.  Hemoglobin 9.7, recent baseline 11.8-12.8.  LFTs normal.  Chest x-ray showing no active pulmonary disease.  CT abdomen pelvis with evidence of acute diverticulitis implicated with an abscess measuring 5.1 x 2.2 x 5.9 cm, free fluid in the pelvis thought to be likely reactive.  No free air to suggest perforation.  CT showing right lower lobe airspace disease concerning for pneumonia or aspiration.  Patient received 1 L normal saline bolus and was started on Zosyn in the ED.  Review of Systems: As per HPI otherwise 10 point review of systems negative.  Past Medical History:  Diagnosis Date  . Allergy    allergic rhinitis  . Anxiety   . Arthritis    osteoarthritis  . Degenerative  disk disease    in neck  . Diabetes mellitus    type II  . Fracture of femoral neck, left ([place]) 01/21/2018  . Hyperlipidemia   . Hypertension   . Hypoaldosteronism ([place]) 6/12   from DM causing hyperkalemia   . Osteopenia   . Periodontal disease   . Peripheral vascular disease ([place])   . Transient cerebral ischemia 11/06/2016    Past Surgical History:  Procedure Laterality Date  . CARDIAC CATHETERIZATION     x2 STENT  . CHOLECYSTECTOMY    . ESOPHAGOGASTRODUODENOSCOPY  03/2004   gastritis by biopsy  . GUM SURGERY  06/2010   laser surgery for gums  . HIP ARTHROPLASTY Left 01/21/2018   Procedure: ARTHROPLASTY BIPOLAR HIP (HEMIARTHROPLASTY);  Surgeon: Marchia Bond, MD;  Location: [place];  Service: Orthopedics;  Laterality: Left;  Marland Kitchen MM BREAST STEREO BX*L*R/S  12/12   normal  . TONSILLECTOMY    . TRANSTHORACIC ECHOCARDIOGRAM  11/2016   EF 55-60%, grade 1 DD with elevated LV end diastolic filling pressures, mildly dilated LA, mild TR and PR.   . TUBAL LIGATION       reports that she has never smoked. She has never used smokeless tobacco. She reports that she does not drink alcohol or use drugs.  Allergies  Allergen Reactions  . Ace Inhibitors Other (See Comments)    REACTION: generic, reaction not known  . Amlodipine Besy-Benazepril Hcl Other (See Comments)    REACTION: chest pain  . Clopidogrel Bisulfate Other (See Comments)    REACTION: GI  .  Erythromycin Other (See Comments)    REACTION: GI upset    Family History  Problem Relation Age of Onset  . Cancer Mother        vaginal CA in situ  . Hypertension Mother   . Heart disease Mother        CAD and MI  . Heart disease Father        CAD  . Diabetes Father   . Heart disease Son        congenital valve problem    Prior to Admission medications   Medication Sig Start Date End Date Taking? Authorizing Provider  amLODipine (NORVASC) 5 MG tablet Take 1 tablet (5 mg total) by mouth daily. 02/16/18  Yes Tower, Wynelle Fanny, MD   atorvastatin (LIPITOR) 20 MG tablet TAKE 1 TABLET BY MOUTH ONCE DAILY Patient taking differently: Take 20 mg by mouth daily.  04/27/18  Yes Tower, Wynelle Fanny, MD  chlorthalidone (HYGROTON) 25 MG tablet Take 1 tablet (25 mg total) by mouth daily. 08/27/17  Yes Tower, Wynelle Fanny, MD  famotidine (PEPCID) 40 MG tablet Take 1 tablet (40 mg total) by mouth daily. 04/28/18  Yes Thornton Park, MD  glipiZIDE (GLIPIZIDE XL) 10 MG 24 hr tablet TAKE 1 TABLET BY MOUTH ONCE DAILY WITH BREAKFAST Patient taking differently: Take 10 mg by mouth daily with breakfast.  02/16/18  Yes Tower, Wynelle Fanny, MD  metFORMIN (GLUCOPHAGE) 1000 MG tablet Take 1 tablet (1,000 mg total) by mouth 2 (two) times daily with a meal. 02/16/18  Yes Tower, Marne A, MD  metoprolol succinate (TOPROL-XL) 100 MG 24 hr tablet TAKE 1 TABLET BY MOUTH ONCE DAILY TAKE  WITH  OR  IMMEDIATELY  FOLLOWING  A  MEAL Patient taking differently: Take 100 mg by mouth daily.  02/16/18  Yes Tower, Wynelle Fanny, MD  PARoxetine (PAXIL) 40 MG tablet Take 1 tablet (40 mg total) by mouth daily. 08/27/17  Yes Tower, Wynelle Fanny, MD  acetaminophen (TYLENOL) 325 MG tablet Take 2 tablets (650 mg total) by mouth every 6 (six) hours as needed. Patient not taking: Reported on 06/04/2018 01/21/18   Marchia Bond, MD  dipyridamole-aspirin [facility]) 200-25 MG 12hr capsule Take 1 capsule by mouth 2 (two) times daily. RESTART THIS IN 30days when you come off ELIQUIS Patient not taking: Reported on 06/04/2018 02/24/18   Domenic Polite, MD    Physical Exam: Vitals:   06/04/18 2215 06/04/18 2245 06/04/18 2325 06/04/18 2350  BP: (!) 123/56 (!) 144/56 (!) 108/55   Pulse:   76 70  Resp: (!) 27 (!) 27    Temp:   97.9 F (36.6 C)   TempSrc:   Oral   SpO2:   (!) 89% 96%  Weight:      Height:        Physical Exam  Constitutional: She is oriented to person, place, and time. She appears well-developed and well-nourished. No distress.  HENT:  Head: Normocephalic.  Mouth/Throat: Oropharynx  is clear and moist.  Eyes: Right eye exhibits no discharge. Left eye exhibits no discharge.  Neck: Neck supple. No tracheal deviation present.  Cardiovascular: Normal rate, regular rhythm and intact distal pulses.  Pulmonary/Chest: Effort normal. No respiratory distress. She has no wheezes.  Abdominal: Soft. Bowel sounds are normal. She exhibits no distension. There is no tenderness. There is no guarding.  Musculoskeletal: She exhibits no edema.  Neurological: She is alert and oriented to person, place, and time.  Skin: Skin is warm and dry. She is  not diaphoretic.  Psychiatric: She has a normal mood and affect. Her behavior is normal.     Labs on Admission: I have personally reviewed following labs and imaging studies  CBC: Recent Labs  Lab 06/04/18 1819  WBC 14.2*  NEUTROABS 12.4*  HGB 9.7*  HCT 29.5*  MCV 92.5  PLT 637   Basic Metabolic Panel: Recent Labs  Lab 06/04/18 1819  NA 131*  K 3.4*  CL 100  CO2 20*  GLUCOSE 147*  BUN 35*  CREATININE 1.27*  CALCIUM 8.2*   GFR: Estimated Creatinine Clearance: 26.4 mL/min (A) (by C-G formula based on SCr of 1.27 mg/dL (H)). Liver Function Tests: Recent Labs  Lab 06/04/18 1819  AST 36  ALT 10  ALKPHOS 51  BILITOT 0.6  PROT 6.0*  ALBUMIN 2.6*   No results for input(s): LIPASE, AMYLASE in the last 168 hours. No results for input(s): AMMONIA in the last 168 hours. Coagulation Profile: No results for input(s): INR, PROTIME in the last 168 hours. Cardiac Enzymes: No results for input(s): CKTOTAL, CKMB, CKMBINDEX, TROPONINI in the last 168 hours. BNP (last 3 results) No results for input(s): PROBNP in the last 8760 hours. HbA1C: No results for input(s): HGBA1C in the last 72 hours. CBG: No results for input(s): GLUCAP in the last 168 hours. Lipid Profile: No results for input(s): CHOL, HDL, LDLCALC, TRIG, CHOLHDL, LDLDIRECT in the last 72 hours. Thyroid Function Tests: No results for input(s): TSH, T4TOTAL, FREET4,  T3FREE, THYROIDAB in the last 72 hours. Anemia Panel: No results for input(s): VITAMINB12, FOLATE, FERRITIN, TIBC, IRON, RETICCTPCT in the last 72 hours. Urine analysis: No results found for: COLORURINE, APPEARANCEUR, LABSPEC, PHURINE, GLUCOSEU, HGBUR, BILIRUBINUR, KETONESUR, PROTEINUR, UROBILINOGEN, NITRITE, LEUKOCYTESUR  Radiological Exams on Admission: Dg Chest 2 View  Result Date: 06/04/2018 CLINICAL DATA:  Weakness and cough x1 week, recent colonoscopy. EXAM: CHEST - 2 VIEW COMPARISON:  01/21/2018 FINDINGS: Normal heart size. Moderate aortic atherosclerosis at the arch without aneurysm. Chronic interstitial prominence is noted of the lungs without pulmonary consolidation, effusion pneumothorax. Calcified densities project over the lung bases compatible with soft tissue calcifications within the breasts on prior exam. There appears be a large eggshell calcification in the left upper quadrant of the abdomen measuring approximately 2.6 x 1.8 cm on the frontal view possibly representing a calcified splenic artery aneurysm, calcified splenic cyst or calcified diverticulum. IMPRESSION: 1. No active pulmonary disease. Aortic atherosclerosis. 2. Chronic interstitial prominence is noted of the lungs without pulmonary consolidation, effusion, pneumothorax. Electronically Signed   By: Ashley Royalty M.D.   On: 06/04/2018 19:43   Ct Abdomen Pelvis W Contrast  Result Date: 06/04/2018 CLINICAL DATA:  Abdominal pain, acute, generalized. Colonoscopy 1 week ago. Dark red blood in stool. Nausea, vomiting, and diarrhea. EXAM: CT ABDOMEN AND PELVIS WITH CONTRAST TECHNIQUE: Multidetector CT imaging of the abdomen and pelvis was performed using the standard protocol following bolus administration of intravenous contrast. CONTRAST:  146mL OMNIPAQUE IOHEXOL 300 MG/ML  SOLN COMPARISON:  CT of the abdomen and pelvis 9/12/5 FINDINGS: Lower chest: Posteromedial right lower lobe airspace disease is concerning for infection or  aspiration. Minimal atelectasis is present at the right base. The heart size is normal. A small hiatal hernia is present. No significant pleural or pericardial effusion is present. Hepatobiliary: No focal liver abnormality is seen. Status post cholecystectomy. No biliary dilatation. Pancreas: Unremarkable. No pancreatic ductal dilatation or surrounding inflammatory changes. Spleen: Normal in size without focal abnormality. Adrenals/Urinary Tract: Adrenal glands are normal bilaterally.  Kidneys and ureters are within normal limits. There is no stone or mass lesion. No obstruction is present. The urinary bladder is within normal limits. Stomach/Bowel: The stomach is otherwise normal. Duodenum is within normal limits as well. Small bowel is unremarkable. Terminal ileum is within normal limits. Appendix is visualized and normal. The ascending and transverse colon are within normal limits. Descending colon is unremarkable. Diverticular changes are present distally. Diverticular changes extend into the sigmoid colon. There is focal inflammation the distal sigmoid colon, above the rectum. Adjacent peripherally enhancing fluid collection in the left anatomic pelvis measures 5.1 x 2.2 x 5.9 cm. Vascular/Lymphatic: No significant adenopathy is present. Atherosclerotic calcifications are present in the aorta without aneurysm. Reproductive: Status post hysterectomy. No adnexal masses. Other: The a small amount of free fluid is present in the anatomic pelvis separate from the peripherally enhancing collection. There is no free air. No ventral hernia is present. Musculoskeletal: Left total hip arthroplasty is present. Bony pelvis is otherwise unremarkable. Degenerative grade 1 anterolisthesis is present at L4-5 with uncovering of a broad-based disc protrusion leading to central and bilateral foraminal narrowing. Central and bilateral foraminal narrowing is also present at L5-S1. IMPRESSION: 1. Sigmoid diverticular disease with  inflammatory changes and pericolonic fluid collection likely reflecting acute diverticulitis complicated with abscess measuring 5.1 x 2.2 x 5.9 cm. 2. Additional free fluid in the anatomic pelvis is likely reactive. 3. No free air to suggest perforation. 4. Right lower lobe airspace disease concerning for pneumonia or aspiration. 5. Degenerative changes in the lower lumbar spine. These results were called by telephone at the time of interpretation on 06/04/2018 at 8:56 pm to Dr. Veryl Speak , who verbally acknowledged these results. Electronically Signed   By: San Morelle M.D.   On: 06/04/2018 20:56    EKG: Independently reviewed.  Sinus rhythm (heart rate 71).  Assessment/Plan Principal Problem:   Colonic diverticular abscess Active Problems:   Type 2 diabetes mellitus (HCC)   HTN (hypertension)   Depression   HLD (hyperlipidemia)   GERD (gastroesophageal reflux disease)   Diverticulitis   Anemia   Protein calorie malnutrition ([place])   Acute diverticulitis complicated by an abscess Afebrile, white count 14.2. CT abdomen pelvis with evidence of acute diverticulitis implicated with an abscess measuring 5.1 x 2.2 x 5.9 cm, free fluid in the pelvis thought to be likely reactive.  No free air to suggest perforation.   -ED physician informed me that they spoke to radiologist Dr. Jobe Igo who believes the abscess is amenable to drainage.  Patient will be seen by the radiology team in the morning. -Zosyn -IV fluid hydration -IV Zofran PRN nausea -Tylenol PRN -Clear liquid diet -Repeat CBC in a.m. -Consult GI in the morning.  Recent colonoscopy was done by the [facility].  Anemia Hemoglobin 9.7, recent baseline 11.8-12.8. Colonoscopy done May 13, 2018 showing evidence of diverticulosis and 4 colon polyps which were resected.  Biopsy showing tubular adenomas.  Patient reports having intermittent melena and hematochezia since the colonoscopy. Current repeat CBC in a.m.  ? PNA on CT   CT abdomen pelvis showing right lower lobe airspace disease concerning for pneumonia or aspiration.  Patient reports having a cough which she attributes to postnasal drip.  She is not dyspneic or hypoxic. -Continue Zosyn for diverticulitis as above  Type 2 diabetes -Check A1c -Sliding scale insulin sensitive -CBG checks  Hypertension -Continue home metoprolol  Hyperlipidemia -Continue home Lipitor  Depression -Continue home Paxil  GERD -Continue H2 blocker  Protein calorie malnutrition Albumin 2.6. -Nutrition consult  DVT prophylaxis: Subcutaneous heparin Code Status: Patient wishes to be full code. Family Communication: No family available. Disposition Plan: Anticipate discharge to home in 2-3 days. Consults called: Radiology (Dr. Jobe Igo) Admission status: It is my clinical opinion that admission to INPATIENT is reasonable and necessary in this 84 y.o. female . presenting with symptoms of nausea, vomiting, blood in stool, concerning for acute diverticulitis complicated by abscess . in the context of PMH including: Diverticulosis . and pertinent positives on radiographic and laboratory data including: Evidence of acute diverticulitis with abscess . Workup and treatment include IV antibiotic, IV fluid hydration.  Abscess drainage needed.  Given the aforementioned, the predictability of an adverse outcome is felt to be significant. I expect that the patient will require at least 2 midnights in the hospital to treat this condition.   Shela Leff MD [facility] Pager [phone]  If 7PM-7AM, please contact night-coverage [email or web address] Password [email or web address]  06/05/2018, 2:30 AM

## 2018-06-04 NOTE — ED Triage Notes (Addendum)
Paitient BIB [facility] for nausea/vomiting/diarrhea x 3 days. States she had a colonoscopy done 1 week ago where she had 4 polyps removed. Reports dark red blood in stool x 3 days as well. Per EMS orthostatic positive on scene, 118 laying & 99 sitting. Patient denies chest pain, SOB, abdominal pain, dizziness, fever, or dysuria.   EMS gave 561ml NS.   BP 128/64 HR 70 RR 18  CBG 155

## 2018-06-04 NOTE — ED Provider Notes (Signed)
[facility] EMERGENCY DEPARTMENT Provider Note   CSN: [id number] Arrival date & time: 06/04/18  1729     History   Chief Complaint Chief Complaint  Patient presents with  . Nausea  . Emesis    HPI Stephanie [name] is a 84 y.o. female.  Patient is an 84 year old female with past medical history of diabetes, hypertension, and peripheral vascular disease.  She recently had a colonoscopy performed (1 week ago) to evaluate for blood in her stools.  Since then her husband states that she has "not been right".  He reports she has been increasingly weak, having nausea and vomiting, decreased appetite, and continued bloody stools.  I have told her that she was orthostatic with EMS.  She denies any fevers or chills.  She does report some generalized abdominal discomfort, however no localized pain.  The history is provided by the patient.  Emesis   This is a new problem. The current episode started yesterday. Episode frequency: Intermittently. The problem has been gradually worsening. There has been no fever. Associated symptoms include abdominal pain. Pertinent negatives include no chills, no diarrhea and no fever.    Past Medical History:  Diagnosis Date  . Allergy    allergic rhinitis  . Anxiety   . Arthritis    osteoarthritis  . Degenerative disk disease    in neck  . Diabetes mellitus    type II  . Fracture of femoral neck, left ([place]) 01/21/2018  . Hyperlipidemia   . Hypertension   . Hypoaldosteronism ([place]) 6/12   from DM causing hyperkalemia   . Osteopenia   . Periodontal disease   . Peripheral vascular disease ([place])   . Transient cerebral ischemia 11/06/2016    Patient Active Problem List   Diagnosis Date Noted  . Fecal occult blood test positive 03/12/2018  . Closed fracture of left hip ([place])   . Chronic kidney disease (CKD), stage II (mild) 01/22/2018  . Fracture of femoral neck, left ([place]) 01/21/2018  . Fall 01/21/2018  . Depression 01/21/2018  .  GERD (gastroesophageal reflux disease) 01/21/2018  . CAD (coronary artery disease) 01/21/2018  . Subcapital fracture of left hip ([place]) 01/21/2018  . Carotid stenosis, bilateral 11/20/2016  . Transient cerebral ischemia 11/06/2016  . Vitamin D deficiency 09/04/2015  . Routine general medical examination at a health care facility 09/04/2015  . Encounter for Medicare annual wellness exam 05/30/2014  . Colon cancer screening 05/30/2014  . History of fall 05/26/2013  . Gynecological examination 02/20/2011  . HELICOBACTER PYLORI INFECTION 12/19/2006  . HSV 12/19/2006  . Type 2 diabetes, uncontrolled, with retinopathy ([place]) 12/19/2006  . Diabetic retinopathy ([place]) 12/19/2006  . Combined hyperlipidemia associated with type 2 diabetes mellitus ([place]) 12/19/2006  . ANXIETY 12/19/2006  . Essential hypertension 12/19/2006  . Coronary atherosclerosis 12/19/2006  . Peripheral vascular disease due to secondary diabetes mellitus ([place]) 12/19/2006  . ALLERGIC RHINITIS 12/19/2006  . OSTEOARTHRITIS 12/19/2006  . Osteopenia 12/19/2006    Past Surgical History:  Procedure Laterality Date  . CARDIAC CATHETERIZATION     x2 STENT  . CHOLECYSTECTOMY    . ESOPHAGOGASTRODUODENOSCOPY  03/2004   gastritis by biopsy  . GUM SURGERY  06/2010   laser surgery for gums  . HIP ARTHROPLASTY Left 01/21/2018   Procedure: ARTHROPLASTY BIPOLAR HIP (HEMIARTHROPLASTY);  Surgeon: Marchia Bond, MD;  Location: [place];  Service: Orthopedics;  Laterality: Left;  Marland Kitchen MM BREAST STEREO BX*L*R/S  12/12   normal  . TONSILLECTOMY    .  TRANSTHORACIC ECHOCARDIOGRAM  11/2016   EF 55-60%, grade 1 DD with elevated LV end diastolic filling pressures, mildly dilated LA, mild TR and PR.   . TUBAL LIGATION       OB History   None      Home Medications    Prior to Admission medications   Medication Sig Start Date End Date Taking? Authorizing Provider  acetaminophen (TYLENOL) 325 MG tablet Take 2 tablets (650 mg total) by mouth  every 6 (six) hours as needed. 01/21/18   Marchia Bond, MD  amLODipine (NORVASC) 5 MG tablet Take 1 tablet (5 mg total) by mouth daily. 02/16/18   Tower, Wynelle Fanny, MD  apixaban (ELIQUIS) 2.5 MG TABS tablet Take 1 tablet (2.5 mg total) by mouth 2 (two) times daily. For 38month 01/24/18 02/24/18  Domenic Polite, MD  atorvastatin (LIPITOR) 20 MG tablet TAKE 1 TABLET BY MOUTH ONCE DAILY 04/27/18   Tower, Wynelle Fanny, MD  chlorthalidone (HYGROTON) 25 MG tablet Take 1 tablet (25 mg total) by mouth daily. 08/27/17   Tower, Wynelle Fanny, MD  diphenhydramine-acetaminophen (TYLENOL PM EXTRA STRENGTH) 25-500 MG TABS Take 1 tablet by mouth at bedtime as needed.      [provider]  dipyridamole-aspirin (AGGRENOX) 200-25 MG 12hr capsule Take 1 capsule by mouth 2 (two) times daily. RESTART THIS IN 30days when you come off ELIQUIS 02/24/18   Domenic Polite, MD  famotidine (PEPCID) 40 MG tablet Take 1 tablet (40 mg total) by mouth daily. 04/28/18   Thornton Park, MD  glipiZIDE (GLIPIZIDE XL) 10 MG 24 hr tablet TAKE 1 TABLET BY MOUTH ONCE DAILY WITH BREAKFAST 02/16/18   Tower, Wynelle Fanny, MD  metFORMIN (GLUCOPHAGE) 1000 MG tablet Take 1 tablet (1,000 mg total) by mouth 2 (two) times daily with a meal. 02/16/18   Tower, Wynelle Fanny, MD  metoprolol succinate (TOPROL-XL) 100 MG 24 hr tablet TAKE 1 TABLET BY MOUTH ONCE DAILY TAKE  WITH  OR  IMMEDIATELY  FOLLOWING  A  MEAL 02/16/18   Tower, Marne A, MD  PARoxetine (PAXIL) 40 MG tablet Take 1 tablet (40 mg total) by mouth daily. 08/27/17   Tower, Wynelle Fanny, MD  ranitidine (ZANTAC) 150 MG tablet Take 150 mg by mouth 2 (two) times daily.    [provider]    Family History Family History  Problem Relation Age of Onset  . Cancer Mother        vaginal CA in situ  . Hypertension Mother   . Heart disease Mother        CAD and MI  . Heart disease Father        CAD  . Diabetes Father   . Heart disease Son        congenital valve problem    Social History Social History    Tobacco Use  . Smoking status: Never Smoker  . Smokeless tobacco: Never Used  Substance Use Topics  . Alcohol use: No    Alcohol/week: 0.0 standard drinks  . Drug use: No     Allergies   Ace inhibitors; Amlodipine besy-benazepril hcl; Clopidogrel bisulfate; and Erythromycin   Review of Systems Review of Systems  Constitutional: Negative for chills and fever.  Gastrointestinal: Positive for abdominal pain and vomiting. Negative for diarrhea.  All other systems reviewed and are negative.    Physical Exam Updated Vital Signs BP (!) 146/58 (BP Location: Right Arm)   Pulse 71   Temp (!) 97.5 F (36.4 C) (Oral)  Resp 17   Ht 5\' 2"  (1.575 m)   Wt 49 kg   SpO2 97%   BMI 19.75 kg/m   Physical Exam  Constitutional: She is oriented to person, place, and time. She appears well-developed and well-nourished. No distress.  HENT:  Head: Normocephalic and atraumatic.  Neck: Normal range of motion. Neck supple.  Cardiovascular: Normal rate and regular rhythm. Exam reveals no gallop and no friction rub.  No murmur heard. Pulmonary/Chest: Effort normal and breath sounds normal. No respiratory distress. She has no wheezes.  Abdominal: Soft. Bowel sounds are normal. She exhibits no distension. There is tenderness.  There is mild generalized abdominal tenderness.  Musculoskeletal: Normal range of motion.  Neurological: She is alert and oriented to person, place, and time.  Skin: Skin is warm and dry. She is not diaphoretic.  Nursing note and vitals reviewed.    ED Treatments / Results  Labs (all labs ordered are listed, but only abnormal results are displayed) Labs Reviewed - No data to display  EKG None  Radiology No results found.  Procedures Procedures (including critical care time)  Medications Ordered in ED Medications  sodium chloride 0.9 % bolus 1,000 mL (has no administration in time range)     Initial Impression / Assessment and Plan / ED Course  I have  reviewed the triage vital signs and the nursing notes.  Pertinent labs & imaging results that were available during my care of the patient were reviewed by me and considered in my medical decision making (see chart for details).  CT scan shows acute diverticulitis with diverticular abscess.  This was discussed with the hospitalist who will admit.  Patient has been given Zosyn.  Final Clinical Impressions(s) / ED Diagnoses   Final diagnoses:  None    ED Discharge Orders    None       Veryl Speak, MD 06/04/18 2238

## 2018-06-05 ENCOUNTER — Other Ambulatory Visit: Payer: Self-pay

## 2018-06-05 ENCOUNTER — Inpatient Hospital Stay (HOSPITAL_COMMUNITY): Payer: Medicare Other

## 2018-06-05 DIAGNOSIS — E46 Unspecified protein-calorie malnutrition: Secondary | ICD-10-CM

## 2018-06-05 DIAGNOSIS — K5720 Diverticulitis of large intestine with perforation and abscess without bleeding: Secondary | ICD-10-CM | POA: Diagnosis present

## 2018-06-05 DIAGNOSIS — D649 Anemia, unspecified: Secondary | ICD-10-CM

## 2018-06-05 LAB — HEMOGLOBIN A1C
Hgb A1c MFr Bld: 5.7 % — ABNORMAL HIGH (ref 4.8–5.6)
MEAN PLASMA GLUCOSE: 116.89 mg/dL

## 2018-06-05 LAB — GLUCOSE, CAPILLARY
GLUCOSE-CAPILLARY: 75 mg/dL (ref 70–99)
Glucose-Capillary: 135 mg/dL — ABNORMAL HIGH (ref 70–99)
Glucose-Capillary: 152 mg/dL — ABNORMAL HIGH (ref 70–99)
Glucose-Capillary: 120 mg/dL — ABNORMAL HIGH (ref 70–99)

## 2018-06-05 LAB — BASIC METABOLIC PANEL
Anion gap: 9 (ref 5–15)
BUN: 30 mg/dL — AB (ref 8–23)
CALCIUM: 8.2 mg/dL — AB (ref 8.9–10.3)
CHLORIDE: 104 mmol/L (ref 98–111)
CO2: 19 mmol/L — AB (ref 22–32)
CREATININE: 1.04 mg/dL — AB (ref 0.44–1.00)
GFR calc Af Amer: 56 mL/min — ABNORMAL LOW (ref >60)
GFR calc non Af Amer: 49 mL/min — ABNORMAL LOW (ref >60)
GLUCOSE: 162 mg/dL — AB (ref 70–99)
Potassium: 3.1 mmol/L — ABNORMAL LOW (ref 3.5–5.1)
Sodium: 132 mmol/L — ABNORMAL LOW (ref 135–145)

## 2018-06-05 LAB — CBC
HEMATOCRIT: 27.7 % — AB (ref 36.0–46.0)
Hemoglobin: 9.2 g/dL — ABNORMAL LOW (ref 12.0–15.0)
MCH: 30.3 pg (ref 26.0–34.0)
MCHC: 33.2 g/dL (ref 30.0–36.0)
MCV: 91.1 fL (ref 80.0–100.0)
NRBC: 0 % (ref 0.0–0.2)
Platelets: 260 10*3/uL (ref 150–400)
RBC: 3.04 MIL/uL — ABNORMAL LOW (ref 3.87–5.11)
RDW: 12.7 % (ref 11.5–15.5)
WBC: 12.3 10*3/uL — AB (ref 4.0–10.5)

## 2018-06-05 LAB — PROTIME-INR
INR: 1.15
Prothrombin Time: 14.6 seconds (ref 11.4–15.2)

## 2018-06-05 MED ORDER — SODIUM CHLORIDE 0.9% FLUSH
5.0000 mL | Freq: Three times a day (TID) | INTRAVENOUS | Status: DC
  Administered 2018-06-05 – 2018-06-10 (×13): 5 mL

## 2018-06-05 MED ORDER — PAROXETINE HCL 20 MG PO TABS
40.0000 mg | ORAL_TABLET | Freq: Every day | ORAL | Status: DC
  Administered 2018-06-05 – 2018-06-09 (×5): 40 mg via ORAL
  Filled 2018-06-05 (×7): qty 2

## 2018-06-05 MED ORDER — ACETAMINOPHEN 650 MG RE SUPP: 650.0000 mg | Freq: Four times a day (QID) | RECTAL | Status: DC | PRN

## 2018-06-05 MED ORDER — LIDOCAINE HCL 1 % IJ SOLN
INTRAMUSCULAR | Status: AC
  Filled 2018-06-05: qty 20

## 2018-06-05 MED ORDER — HEPARIN SODIUM (PORCINE) 5000 UNIT/ML IJ SOLN: 5000.0000 [IU] | Freq: Three times a day (TID) | INTRAMUSCULAR | Status: DC

## 2018-06-05 MED ORDER — MIDAZOLAM HCL 2 MG/2ML IJ SOLN
INTRAMUSCULAR | Status: AC | PRN
  Administered 2018-06-05: 1 mg via INTRAVENOUS

## 2018-06-05 MED ORDER — SODIUM CHLORIDE 0.9 % IV SOLN
INTRAVENOUS | Status: AC
  Administered 2018-06-05: via INTRAVENOUS

## 2018-06-05 MED ORDER — MIDAZOLAM HCL 2 MG/2ML IJ SOLN
INTRAMUSCULAR | Status: AC
  Filled 2018-06-05: qty 2

## 2018-06-05 MED ORDER — FAMOTIDINE 20 MG PO TABS
40.0000 mg | ORAL_TABLET | Freq: Every day | ORAL | Status: DC
  Administered 2018-06-05 – 2018-06-10 (×6): 40 mg via ORAL
  Filled 2018-06-05 (×6): qty 2

## 2018-06-05 MED ORDER — INSULIN ASPART 100 UNIT/ML ~~LOC~~ SOLN
0.0000 [IU] | Freq: Three times a day (TID) | SUBCUTANEOUS | Status: DC
  Administered 2018-06-06: 1 [IU] via SUBCUTANEOUS
  Administered 2018-06-06 – 2018-06-07 (×2): 2 [IU] via SUBCUTANEOUS
  Administered 2018-06-07: 5 [IU] via SUBCUTANEOUS
  Administered 2018-06-07 – 2018-06-08 (×3): 2 [IU] via SUBCUTANEOUS
  Administered 2018-06-08: 3 [IU] via SUBCUTANEOUS
  Administered 2018-06-09 – 2018-06-10 (×4): 2 [IU] via SUBCUTANEOUS

## 2018-06-05 MED ORDER — ADULT MULTIVITAMIN W/MINERALS CH
1.0000 | ORAL_TABLET | Freq: Every day | ORAL | Status: DC
  Administered 2018-06-06 – 2018-06-10 (×5): 1 via ORAL
  Filled 2018-06-05 (×5): qty 1

## 2018-06-05 MED ORDER — ACETAMINOPHEN 325 MG PO TABS: 650.0000 mg | ORAL_TABLET | Freq: Four times a day (QID) | ORAL | Status: DC | PRN

## 2018-06-05 MED ORDER — ONDANSETRON HCL 4 MG/2ML IJ SOLN: 4.0000 mg | Freq: Four times a day (QID) | INTRAMUSCULAR | Status: DC | PRN

## 2018-06-05 MED ORDER — ATORVASTATIN CALCIUM 10 MG PO TABS
20.0000 mg | ORAL_TABLET | Freq: Every day | ORAL | Status: DC
  Administered 2018-06-05 – 2018-06-10 (×6): 20 mg via ORAL
  Filled 2018-06-05 (×6): qty 2

## 2018-06-05 MED ORDER — FENTANYL CITRATE (PF) 100 MCG/2ML IJ SOLN
INTRAMUSCULAR | Status: AC
  Filled 2018-06-05: qty 2

## 2018-06-05 MED ORDER — PIPERACILLIN-TAZOBACTAM 3.375 G IVPB
3.3750 g | Freq: Three times a day (TID) | INTRAVENOUS | Status: DC
  Administered 2018-06-05 – 2018-06-08 (×10): 3.375 g via INTRAVENOUS
  Filled 2018-06-05 (×7): qty 50

## 2018-06-05 MED ORDER — METOPROLOL SUCCINATE ER 100 MG PO TB24
100.0000 mg | ORAL_TABLET | Freq: Every day | ORAL | Status: DC
  Administered 2018-06-05 – 2018-06-10 (×6): 100 mg via ORAL
  Filled 2018-06-05 (×6): qty 1

## 2018-06-05 MED ORDER — POTASSIUM CHLORIDE 10 MEQ/100ML IV SOLN
10.0000 meq | INTRAVENOUS | Status: AC
  Administered 2018-06-05 (×2): 10 meq via INTRAVENOUS
  Filled 2018-06-05: qty 100

## 2018-06-05 MED ORDER — BENZONATATE 100 MG PO CAPS
200.0000 mg | ORAL_CAPSULE | Freq: Three times a day (TID) | ORAL | Status: DC | PRN
  Administered 2018-06-05 – 2018-06-08 (×4): 200 mg via ORAL
  Filled 2018-06-05 (×4): qty 2

## 2018-06-05 MED ORDER — GUAIFENESIN-DM 100-10 MG/5ML PO SYRP: 5.0000 mL | ORAL_SOLUTION | ORAL | Status: DC | PRN

## 2018-06-05 MED ORDER — BOOST / RESOURCE BREEZE PO LIQD CUSTOM
1.0000 | Freq: Three times a day (TID) | ORAL | Status: DC
  Administered 2018-06-07 (×2): 1 via ORAL

## 2018-06-05 MED ORDER — HEPARIN SODIUM (PORCINE) 5000 UNIT/ML IJ SOLN
5000.0000 [IU] | Freq: Three times a day (TID) | INTRAMUSCULAR | Status: DC
  Administered 2018-06-06 – 2018-06-10 (×11): 5000 [IU] via SUBCUTANEOUS
  Filled 2018-06-05 (×13): qty 1

## 2018-06-05 MED ORDER — FENTANYL CITRATE (PF) 100 MCG/2ML IJ SOLN
INTRAMUSCULAR | Status: AC | PRN
  Administered 2018-06-05: 50 ug via INTRAVENOUS

## 2018-06-05 MED ORDER — SODIUM CHLORIDE 0.9 % IV SOLN
INTRAVENOUS | Status: DC | PRN
  Administered 2018-06-05: 250 mL via INTRAVENOUS
  Administered 2018-06-08: 1000 mL via INTRAVENOUS

## 2018-06-05 NOTE — Consult Note (Signed)
[facility] Surgery Consult Note  Stephanie [name] Jun 29, 1934  [id number].    Requesting MD: Hosie Poisson Chief Complaint/Reason for Consult: diverticulitis with abscess  HPI:  Stephanie [name] is an 84yo female PMH DM-II, HTN, HLD, anxiety who was admitted to [facility] 11/21 with sigmoid diverticulitis with abscess. She had never had diverticulitis before. States that about 12 days ago she started having some nausea, vomiting, and diarrhea. Stools were dark. Mild lower abdominal discomfort but denies any true pain. Symptoms persisted therefore she decided to go to the ED.  CT abdomen/pelvis showed sigmoid diverticular disease with inflammatory changes and pericolonic fluid collection likely reflecting acute diverticulitis complicated with abscess measuring 5.1 x 2.2 x 5.9 cm.  Initially WBC 14.2, hg 9.7, BUN/creatinine 35/1.27. She was started on IV zosyn, bowel rest, and IVF rehydration. Currently she is afebrile and vital signs stable. General surgery asked to see.  -Abdominal surgical history: cholecystectomy, tubal ligation -Colonoscopy 05/13/18 Dr. Tarri Glenn: diverticulosis in the sigmoid, descending, and ascending colon; 4 polyps at the hepatic flexure, descending colon and in the cecum as well as an ulcer in rectum biopsied -Anticoagulants: none -Lives at home with her husband  ROS: Review of Systems  Constitutional: Negative.  Negative for chills and fever.  HENT: Negative.   Eyes: Negative.   Respiratory: Negative.   Cardiovascular: Negative.   Gastrointestinal: Positive for abdominal pain, diarrhea, nausea and vomiting.  Genitourinary: Negative.   Musculoskeletal: Negative.   Skin: Negative.   Neurological: Negative.    All systems reviewed and otherwise negative except for as above  Family History  Problem Relation Age of Onset  . Cancer Mother        vaginal CA in situ  . Hypertension Mother   . Heart disease Mother        CAD and MI  . Heart disease Father         CAD  . Diabetes Father   . Heart disease Son        congenital valve problem    Past Medical History:  Diagnosis Date  . Allergy    allergic rhinitis  . Anxiety   . Arthritis    osteoarthritis  . Degenerative disk disease    in neck  . Diabetes mellitus    type II  . Fracture of femoral neck, left ([place]) 01/21/2018  . Hyperlipidemia   . Hypertension   . Hypoaldosteronism ([place]) 6/12   from DM causing hyperkalemia   . Osteopenia   . Periodontal disease   . Peripheral vascular disease ([place])   . Transient cerebral ischemia 11/06/2016    Past Surgical History:  Procedure Laterality Date  . CARDIAC CATHETERIZATION     x2 STENT  . CHOLECYSTECTOMY    . ESOPHAGOGASTRODUODENOSCOPY  03/2004   gastritis by biopsy  . GUM SURGERY  06/2010   laser surgery for gums  . HIP ARTHROPLASTY Left 01/21/2018   Procedure: ARTHROPLASTY BIPOLAR HIP (HEMIARTHROPLASTY);  Surgeon: Marchia Bond, MD;  Location: [place];  Service: Orthopedics;  Laterality: Left;  Marland Kitchen MM BREAST STEREO BX*L*R/S  12/12   normal  . TONSILLECTOMY    . TRANSTHORACIC ECHOCARDIOGRAM  11/2016   EF 55-60%, grade 1 DD with elevated LV end diastolic filling pressures, mildly dilated LA, mild TR and PR.   . TUBAL LIGATION      Social History:  reports that she has never smoked. She has never used smokeless tobacco. She reports that she does not drink alcohol or use drugs.  Allergies:  Allergies  Allergen Reactions  . Ace Inhibitors Other (See Comments)    REACTION: generic, reaction not known  . Amlodipine Besy-Benazepril Hcl Other (See Comments)    REACTION: chest pain  . Clopidogrel Bisulfate Other (See Comments)    REACTION: GI  . Erythromycin Other (See Comments)    REACTION: GI upset    Medications Prior to Admission  Medication Sig Dispense Refill  . amLODipine (NORVASC) 5 MG tablet Take 1 tablet (5 mg total) by mouth daily. 90 tablet 3  . atorvastatin (LIPITOR) 20 MG tablet TAKE 1 TABLET BY MOUTH ONCE DAILY  (Patient taking differently: Take 20 mg by mouth daily. ) 90 tablet 1  . chlorthalidone (HYGROTON) 25 MG tablet Take 1 tablet (25 mg total) by mouth daily. 90 tablet 3  . famotidine (PEPCID) 40 MG tablet Take 1 tablet (40 mg total) by mouth daily. 30 tablet 3  . glipiZIDE (GLIPIZIDE XL) 10 MG 24 hr tablet TAKE 1 TABLET BY MOUTH ONCE DAILY WITH BREAKFAST (Patient taking differently: Take 10 mg by mouth daily with breakfast. ) 90 tablet 3  . metFORMIN (GLUCOPHAGE) 1000 MG tablet Take 1 tablet (1,000 mg total) by mouth 2 (two) times daily with a meal. 180 tablet 3  . metoprolol succinate (TOPROL-XL) 100 MG 24 hr tablet TAKE 1 TABLET BY MOUTH ONCE DAILY TAKE  WITH  OR  IMMEDIATELY  FOLLOWING  A  MEAL (Patient taking differently: Take 100 mg by mouth daily. ) 90 tablet 3  . PARoxetine (PAXIL) 40 MG tablet Take 1 tablet (40 mg total) by mouth daily. 90 tablet 3  . acetaminophen (TYLENOL) 325 MG tablet Take 2 tablets (650 mg total) by mouth every 6 (six) hours as needed. (Patient not taking: Reported on 06/04/2018) 60 tablet 1  . dipyridamole-aspirin (AGGRENOX) 200-25 MG 12hr capsule Take 1 capsule by mouth 2 (two) times daily. RESTART THIS IN 30days when you come off ELIQUIS (Patient not taking: Reported on 06/04/2018)      Prior to Admission medications   Medication Sig Start Date End Date Taking? Authorizing Provider  amLODipine (NORVASC) 5 MG tablet Take 1 tablet (5 mg total) by mouth daily. 02/16/18  Yes Tower, Wynelle Fanny, MD  atorvastatin (LIPITOR) 20 MG tablet TAKE 1 TABLET BY MOUTH ONCE DAILY Patient taking differently: Take 20 mg by mouth daily.  04/27/18  Yes Tower, Wynelle Fanny, MD  chlorthalidone (HYGROTON) 25 MG tablet Take 1 tablet (25 mg total) by mouth daily. 08/27/17  Yes Tower, Wynelle Fanny, MD  famotidine (PEPCID) 40 MG tablet Take 1 tablet (40 mg total) by mouth daily. 04/28/18  Yes Thornton Park, MD  glipiZIDE (GLIPIZIDE XL) 10 MG 24 hr tablet TAKE 1 TABLET BY MOUTH ONCE DAILY WITH  BREAKFAST Patient taking differently: Take 10 mg by mouth daily with breakfast.  02/16/18  Yes Tower, Wynelle Fanny, MD  metFORMIN (GLUCOPHAGE) 1000 MG tablet Take 1 tablet (1,000 mg total) by mouth 2 (two) times daily with a meal. 02/16/18  Yes Tower, Marne A, MD  metoprolol succinate (TOPROL-XL) 100 MG 24 hr tablet TAKE 1 TABLET BY MOUTH ONCE DAILY TAKE  WITH  OR  IMMEDIATELY  FOLLOWING  A  MEAL Patient taking differently: Take 100 mg by mouth daily.  02/16/18  Yes Tower, Wynelle Fanny, MD  PARoxetine (PAXIL) 40 MG tablet Take 1 tablet (40 mg total) by mouth daily. 08/27/17  Yes Tower, Wynelle Fanny, MD  acetaminophen (TYLENOL) 325 MG tablet Take 2 tablets (650 mg total)  by mouth every 6 (six) hours as needed. Patient not taking: Reported on 06/04/2018 01/21/18   Marchia Bond, MD  dipyridamole-aspirin [facility]) 200-25 MG 12hr capsule Take 1 capsule by mouth 2 (two) times daily. RESTART THIS IN 30days when you come off ELIQUIS Patient not taking: Reported on 06/04/2018 02/24/18   Domenic Polite, MD    Blood pressure (!) 152/56, pulse 78, temperature 97.8 F (36.6 C), temperature source Oral, resp. rate 16, height '5\' 2"'  (1.575 m), weight 49 kg, SpO2 97 %. Physical Exam: General: pleasant, WD/WN white female who is laying in bed in NAD HEENT: head is normocephalic, atraumatic.  Sclera are noninjected.  Pupils equal and round.  Ears and nose without any masses or lesions.  Mouth is pink and moist. Dentition fair Heart: regular, rate, and rhythm.  No obvious murmurs, gallops, or rubs noted.  Palpable pedal pulses bilaterally Lungs: CTAB, no wheezes, rhonchi, or rales noted.  Respiratory effort nonlabored Abd: soft, ND, +BS, no masses, hernias, or organomegaly. nontender abdomen MS: all 4 extremities are symmetrical with no cyanosis, clubbing, or edema. Skin: warm and dry with no masses, lesions, or rashes Psych: A&Ox3 with an appropriate affect. Neuro: cranial nerves grossly intact, extremity CSM intact bilaterally,  normal speech  Results for orders placed or performed during the hospital encounter of 06/04/18 (from the past 48 hour(s))  Comprehensive metabolic panel     Status: Abnormal   Collection Time: 06/04/18  6:19 PM  Result Value Ref Range   Sodium 131 (L) 135 - 145 mmol/L   Potassium 3.4 (L) 3.5 - 5.1 mmol/L   Chloride 100 98 - 111 mmol/L   CO2 20 (L) 22 - 32 mmol/L   Glucose, Bld 147 (H) 70 - 99 mg/dL   BUN 35 (H) 8 - 23 mg/dL   Creatinine, Ser 1.27 (H) 0.44 - 1.00 mg/dL   Calcium 8.2 (L) 8.9 - 10.3 mg/dL   Total Protein 6.0 (L) 6.5 - 8.1 g/dL   Albumin 2.6 (L) 3.5 - 5.0 g/dL   AST 36 15 - 41 U/L   ALT 10 0 - 44 U/L   Alkaline Phosphatase 51 38 - 126 U/L   Total Bilirubin 0.6 0.3 - 1.2 mg/dL   GFR calc non Af Amer 38 (L) >60 mL/min   GFR calc Af Amer 44 (L) >60 mL/min    Comment: (NOTE) The eGFR has been calculated using the CKD EPI equation. This calculation has not been validated in all clinical situations. eGFR's persistently <60 mL/min signify possible Chronic Kidney Disease.    Anion gap 11 5 - 15    Comment: Performed at [facility] [address], [place], [place] [address]  CBC with Differential     Status: Abnormal   Collection Time: 06/04/18  6:19 PM  Result Value Ref Range   WBC 14.2 (H) 4.0 - 10.5 K/uL   RBC 3.19 (L) 3.87 - 5.11 MIL/uL   Hemoglobin 9.7 (L) 12.0 - 15.0 g/dL   HCT 29.5 (L) 36.0 - 46.0 %   MCV 92.5 80.0 - 100.0 fL   MCH 30.4 26.0 - 34.0 pg   MCHC 32.9 30.0 - 36.0 g/dL   RDW 12.7 11.5 - 15.5 %   Platelets 251 150 - 400 K/uL   nRBC 0.0 0.0 - 0.2 %   Neutrophils Relative % 90 %   Neutro Abs 12.4 (H) 1.7 - 7.7 K/uL   Lymphocytes Relative 6 %   Lymphs Abs 0.8 0.7 - 4.0  K/uL   Monocytes Relative 4 %   Monocytes Absolute 0.6 0.1 - 1.0 K/uL   Eosinophils Relative 0 %   Eosinophils Absolute 0.0 0.0 - 0.5 K/uL   Basophils Relative 0 %   Basophils Absolute 0.0 0.0 - 0.1 K/uL   nRBC 0 0 /100 WBC   Abs Immature Granulocytes 0.00 0.00 - 0.07  K/uL   Acanthocytes PRESENT    Burr Cells PRESENT     Comment: Performed at [facility], [place] [address] [place], [place] [address]  CBC     Status: Abnormal   Collection Time: 06/05/18  3:06 AM  Result Value Ref Range   WBC 12.3 (H) 4.0 - 10.5 K/uL   RBC 3.04 (L) 3.87 - 5.11 MIL/uL   Hemoglobin 9.2 (L) 12.0 - 15.0 g/dL   HCT 27.7 (L) 36.0 - 46.0 %   MCV 91.1 80.0 - 100.0 fL   MCH 30.3 26.0 - 34.0 pg   MCHC 33.2 30.0 - 36.0 g/dL   RDW 12.7 11.5 - 15.5 %   Platelets 260 150 - 400 K/uL   nRBC 0.0 0.0 - 0.2 %    Comment: Performed at [facility], [place] [address], [place], [place] [address]  Hemoglobin A1c     Status: Abnormal   Collection Time: 06/05/18  3:06 AM  Result Value Ref Range   Hgb A1c MFr Bld 5.7 (H) 4.8 - 5.6 %    Comment: (NOTE) Pre diabetes:          5.7%-6.4% Diabetes:              >6.4% Glycemic control for   <7.0% adults with diabetes    Mean Plasma Glucose 116.89 mg/dL    Comment: Performed at [facility] [address], [place], [place] [address]  Glucose, capillary     Status: None   Collection Time: 06/05/18  8:18 AM  Result Value Ref Range   Glucose-Capillary 75 70 - 99 mg/dL   Comment 1 Notify RN    Dg Chest 2 View  Result Date: 06/04/2018 CLINICAL DATA:  Weakness and cough x1 week, recent colonoscopy. EXAM: CHEST - 2 VIEW COMPARISON:  01/21/2018 FINDINGS: Normal heart size. Moderate aortic atherosclerosis at the arch without aneurysm. Chronic interstitial prominence is noted of the lungs without pulmonary consolidation, effusion pneumothorax. Calcified densities project over the lung bases compatible with soft tissue calcifications within the breasts on prior exam. There appears be a large eggshell calcification in the left upper quadrant of the abdomen measuring approximately 2.6 x 1.8 cm on the frontal view possibly representing a calcified splenic artery aneurysm, calcified splenic cyst or calcified diverticulum. IMPRESSION: 1. No  active pulmonary disease. Aortic atherosclerosis. 2. Chronic interstitial prominence is noted of the lungs without pulmonary consolidation, effusion, pneumothorax. Electronically Signed   By: Ashley Royalty M.D.   On: 06/04/2018 19:43   Ct Abdomen Pelvis W Contrast  Result Date: 06/04/2018 CLINICAL DATA:  Abdominal pain, acute, generalized. Colonoscopy 1 week ago. Dark red blood in stool. Nausea, vomiting, and diarrhea. EXAM: CT ABDOMEN AND PELVIS WITH CONTRAST TECHNIQUE: Multidetector CT imaging of the abdomen and pelvis was performed using the standard protocol following bolus administration of intravenous contrast. CONTRAST:  171m OMNIPAQUE IOHEXOL 300 MG/ML  SOLN COMPARISON:  CT of the abdomen and pelvis 9/12/5 FINDINGS: Lower chest: Posteromedial right lower lobe airspace disease is concerning for infection or aspiration. Minimal atelectasis is present at the right base. The heart size  is normal. A small hiatal hernia is present. No significant pleural or pericardial effusion is present. Hepatobiliary: No focal liver abnormality is seen. Status post cholecystectomy. No biliary dilatation. Pancreas: Unremarkable. No pancreatic ductal dilatation or surrounding inflammatory changes. Spleen: Normal in size without focal abnormality. Adrenals/Urinary Tract: Adrenal glands are normal bilaterally. Kidneys and ureters are within normal limits. There is no stone or mass lesion. No obstruction is present. The urinary bladder is within normal limits. Stomach/Bowel: The stomach is otherwise normal. Duodenum is within normal limits as well. Small bowel is unremarkable. Terminal ileum is within normal limits. Appendix is visualized and normal. The ascending and transverse colon are within normal limits. Descending colon is unremarkable. Diverticular changes are present distally. Diverticular changes extend into the sigmoid colon. There is focal inflammation the distal sigmoid colon, above the rectum. Adjacent peripherally  enhancing fluid collection in the left anatomic pelvis measures 5.1 x 2.2 x 5.9 cm. Vascular/Lymphatic: No significant adenopathy is present. Atherosclerotic calcifications are present in the aorta without aneurysm. Reproductive: Status post hysterectomy. No adnexal masses. Other: The a small amount of free fluid is present in the anatomic pelvis separate from the peripherally enhancing collection. There is no free air. No ventral hernia is present. Musculoskeletal: Left total hip arthroplasty is present. Bony pelvis is otherwise unremarkable. Degenerative grade 1 anterolisthesis is present at L4-5 with uncovering of a broad-based disc protrusion leading to central and bilateral foraminal narrowing. Central and bilateral foraminal narrowing is also present at L5-S1. IMPRESSION: 1. Sigmoid diverticular disease with inflammatory changes and pericolonic fluid collection likely reflecting acute diverticulitis complicated with abscess measuring 5.1 x 2.2 x 5.9 cm. 2. Additional free fluid in the anatomic pelvis is likely reactive. 3. No free air to suggest perforation. 4. Right lower lobe airspace disease concerning for pneumonia or aspiration. 5. Degenerative changes in the lower lumbar spine. These results were called by telephone at the time of interpretation on 06/04/2018 at 8:56 pm to Dr. Veryl Speak , who verbally acknowledged these results. Electronically Signed   By: San Morelle M.D.   On: 06/04/2018 20:56   Anti-infectives (From admission, onward)   Start     Dose/Rate Route Frequency Ordered Stop   06/05/18 0600  piperacillin-tazobactam (ZOSYN) IVPB 3.375 g     3.375 g 12.5 mL/hr over 240 Minutes Intravenous Every 8 hours 06/05/18 0017     06/04/18 2200  piperacillin-tazobactam (ZOSYN) IVPB 3.375 g     3.375 g 12.5 mL/hr over 240 Minutes Intravenous  Once 06/04/18 2148 06/04/18 2255        Assessment/Plan DM-II HTN HLD Anxiety  Sigmoid diverticulitis with abscess - recent  colonoscopy 05/13/18 Dr. Tarri Glenn: diverticulosis in the sigmoid, descending, and ascending colon; 4 polyps at the hepatic flexure, descending colon and in the cecum (tubular adenomas) as well as an ulcer in rectum (benign) biopsied - CT scan 11/21 sigmoid diverticular disease with inflammatory changes and pericolonic fluid collection likely reflecting acute diverticulitis complicated with abscess measuring 5.1 x 2.2 x 5.9 cm  ID - zosyn 11/21>> VTE - SCDs, sq heparin FEN - IVF, CLD Foley - none  Plan - 1st bout of diverticulitis. Would not recommend any urgent surgical intervention at this time. Agree with IV zosyn and IR consult for possible percutaneous drain placement for abscess. Will continue to follow. Hopefully this will resolve without the need for surgery this admission.  Wellington Hampshire, [facility] Surgery 06/05/2018, 11:55 AM Pager: [phone] Mon 7:00 am -11:30 AM Tues-Fri 7:00  am-4:30 pm Sat-Sun 7:00 am-11:30 am

## 2018-06-05 NOTE — Progress Notes (Signed)
Pharmacy Antibiotic Note  LATORSHA CURLING is a 84 y.o. female admitted on 06/04/2018 with intra-abdominal infection.  Pharmacy has been consulted for Zosyn dosing. WBC elevated. Mild bump in SCr.   Plan: Zosyn 3.375G IV q8h to be infused over 4 hours Trend WBC, temp, renal function  F/U infectious work-up  Height: 5\' 2"  (157.5 cm) Weight: 108 lb (49 kg) IBW/kg (Calculated) : 50.1  Temp (24hrs), Avg:97.7 F (36.5 C), Min:97.5 F (36.4 C), Max:97.9 F (36.6 C)  Recent Labs  Lab 06/04/18 1819  WBC 14.2*  CREATININE 1.27*    Estimated Creatinine Clearance: 26.4 mL/min (A) (by C-G formula based on SCr of 1.27 mg/dL (H)).    Allergies  Allergen Reactions  . Ace Inhibitors Other (See Comments)    REACTION: generic, reaction not known  . Amlodipine Besy-Benazepril Hcl Other (See Comments)    REACTION: chest pain  . Clopidogrel Bisulfate Other (See Comments)    REACTION: GI  . Erythromycin Other (See Comments)    REACTION: GI upset     Narda Bonds 06/05/2018 12:17 AM

## 2018-06-05 NOTE — Progress Notes (Signed)
PROGRESS NOTE    Stephanie [name]  TFT:[id number] DOB: 1933-09-24 DOA: 06/04/2018 PCP: Abner Greenspan, MD    Brief Narrative:  Stephanie [name] is a 84 y.o. female with medical history significant of anxiety, type 2 diabetes, hypertension, hyperlipidemia, diverticulosis presenting to the hospital via EMS for evaluation of nausea and vomiting.  CT abdomen pelvis with evidence of acute diverticulitis implicated with an abscess measuring 5.1 x 2.2 x 5.9 cm, free fluid in the pelvis thought to be likely reactive.  No free air to suggest perforation.  CT showing right lower lobe airspace disease concerning for pneumonia or aspiration. She was started on IV zosyn and IV fluids and admitted to [facility] service.   Assessment & Plan:   Principal Problem:   Colonic diverticular abscess Active Problems:   Type 2 diabetes mellitus (HCC)   HTN (hypertension)   Depression   HLD (hyperlipidemia)   GERD (gastroesophageal reflux disease)   Diverticulitis   Anemia   Protein calorie malnutrition ([place])   Acute diverticulitis with abscess formation;  S/p IR guided perc drainage of the abscess.  No nausea, or vomiting , abd pain is better but she has persistent diarrhea.  Currently NPO. Gently hydrate and continue with IV zosyn. Surgery consulted and appreciate recommendations.    Anemia of chronic disease: Baseline around 12.  Get anemia panel in am.  Repeat H&H in am.    Questionable pneumonia on CT abdomen and pelvis: Pt reports cough only and some sob.  Continue with IV zosyn.    Type 2 DM: CBG (last 3)  Recent Labs    06/05/18 0818 06/05/18 1209 06/05/18 1733  GLUCAP 75 135* 152*   Well controlled.  A1c is 5.7.    Hyperlipidemia:  Resume lipitor    Hypertension:  Well controlled.    Depression:  Stable.       DVT prophylaxis: scd's Code Status: full code.  Family Communication: family at bedside.  Disposition Plan: pending clinical improvement.    Consultants:    IR  SURGERY.   Procedures: Percutaneous drain placement.   Antimicrobials:IV zosyn since admission.   Subjective: No chest pain or sob. No nausea or vomiting. abd pain improved.   Objective: Vitals:   06/05/18 1545 06/05/18 1550 06/05/18 1601 06/05/18 1605  BP: (!) 112/59 (!) 112/51 121/84 (!) 129/57  Pulse: 67 67 65 63  Resp:      Temp:      TempSrc:      SpO2: 90% 96% 93% 93%  Weight:      Height:        Intake/Output Summary (Last 24 hours) at 06/05/2018 1756 Last data filed at 06/05/2018 0626 Gross per 24 hour  Intake 943.21 ml  Output 450 ml  Net 493.21 ml   Filed Weights   06/04/18 1734  Weight: 49 kg    Examination:  General exam: Appears calm and comfortable  Respiratory system: Clear to auscultation. Respiratory effort normal. Cardiovascular system: S1 & S2 heard, RRR. No JVD,  . No pedal edema. Gastrointestinal system: Abdomen is tender in the left lower quadrant, soft no signs of peritonitis.  Central nervous system: Alert and oriented. Nonfocal.  Extremities: Symmetric 5 x 5 power. Skin: No rashes, lesions or ulcers Psychiatry: Mood & affect appropriate.     Data Reviewed: I have personally reviewed following labs and imaging studies  CBC: Recent Labs  Lab 06/04/18 1819 06/05/18 0306  WBC 14.2* 12.3*  NEUTROABS 12.4*  --   HGB  9.7* 9.2*  HCT 29.5* 27.7*  MCV 92.5 91.1  PLT 251 403   Basic Metabolic Panel: Recent Labs  Lab 06/04/18 1819 06/05/18 1156  NA 131* 132*  K 3.4* 3.1*  CL 100 104  CO2 20* 19*  GLUCOSE 147* 162*  BUN 35* 30*  CREATININE 1.27* 1.04*  CALCIUM 8.2* 8.2*   GFR: Estimated Creatinine Clearance: 32.3 mL/min (A) (by C-G formula based on SCr of 1.04 mg/dL (H)). Liver Function Tests: Recent Labs  Lab 06/04/18 1819  AST 36  ALT 10  ALKPHOS 51  BILITOT 0.6  PROT 6.0*  ALBUMIN 2.6*   No results for input(s): LIPASE, AMYLASE in the last 168 hours. No results for input(s): AMMONIA in the last 168  hours. Coagulation Profile: Recent Labs  Lab 06/05/18 1400  INR 1.15   Cardiac Enzymes: No results for input(s): CKTOTAL, CKMB, CKMBINDEX, TROPONINI in the last 168 hours. BNP (last 3 results) No results for input(s): PROBNP in the last 8760 hours. HbA1C: Recent Labs    06/05/18 0306  HGBA1C 5.7*   CBG: Recent Labs  Lab 06/05/18 0818 06/05/18 1209 06/05/18 1733  GLUCAP 75 135* 152*   Lipid Profile: No results for input(s): CHOL, HDL, LDLCALC, TRIG, CHOLHDL, LDLDIRECT in the last 72 hours. Thyroid Function Tests: No results for input(s): TSH, T4TOTAL, FREET4, T3FREE, THYROIDAB in the last 72 hours. Anemia Panel: No results for input(s): VITAMINB12, FOLATE, FERRITIN, TIBC, IRON, RETICCTPCT in the last 72 hours. Sepsis Labs: No results for input(s): PROCALCITON, LATICACIDVEN in the last 168 hours.  Recent Results (from the past 240 hour(s))  Aerobic/Anaerobic Culture (surgical/deep wound)     Status: None (Preliminary result)   Collection Time: 06/05/18  4:06 PM  Result Value Ref Range Status   Specimen Description ABSCESS ABDOMEN  Final   Special Requests   Final    Normal Performed at [facility], [address] [place], [place] [address]    Gram Stain NO WBC SEEN NO ORGANISMS SEEN   Final   Culture PENDING  Incomplete   Report Status PENDING  Incomplete         Radiology Studies: Dg Chest 2 View  Result Date: 06/04/2018 CLINICAL DATA:  Weakness and cough x1 week, recent colonoscopy. EXAM: CHEST - 2 VIEW COMPARISON:  01/21/2018 FINDINGS: Normal heart size. Moderate aortic atherosclerosis at the arch without aneurysm. Chronic interstitial prominence is noted of the lungs without pulmonary consolidation, effusion pneumothorax. Calcified densities project over the lung bases compatible with soft tissue calcifications within the breasts on prior exam. There appears be a large eggshell calcification in the left upper quadrant of the abdomen measuring  approximately 2.6 x 1.8 cm on the frontal view possibly representing a calcified splenic artery aneurysm, calcified splenic cyst or calcified diverticulum. IMPRESSION: 1. No active pulmonary disease. Aortic atherosclerosis. 2. Chronic interstitial prominence is noted of the lungs without pulmonary consolidation, effusion, pneumothorax. Electronically Signed   By: Ashley Royalty M.D.   On: 06/04/2018 19:43   Ct Abdomen Pelvis W Contrast  Result Date: 06/04/2018 CLINICAL DATA:  Abdominal pain, acute, generalized. Colonoscopy 1 week ago. Dark red blood in stool. Nausea, vomiting, and diarrhea. EXAM: CT ABDOMEN AND PELVIS WITH CONTRAST TECHNIQUE: Multidetector CT imaging of the abdomen and pelvis was performed using the standard protocol following bolus administration of intravenous contrast. CONTRAST:  168mL OMNIPAQUE IOHEXOL 300 MG/ML  SOLN COMPARISON:  CT of the abdomen and pelvis 9/12/5 FINDINGS: Lower chest: Posteromedial right lower lobe airspace disease  is concerning for infection or aspiration. Minimal atelectasis is present at the right base. The heart size is normal. A small hiatal hernia is present. No significant pleural or pericardial effusion is present. Hepatobiliary: No focal liver abnormality is seen. Status post cholecystectomy. No biliary dilatation. Pancreas: Unremarkable. No pancreatic ductal dilatation or surrounding inflammatory changes. Spleen: Normal in size without focal abnormality. Adrenals/Urinary Tract: Adrenal glands are normal bilaterally. Kidneys and ureters are within normal limits. There is no stone or mass lesion. No obstruction is present. The urinary bladder is within normal limits. Stomach/Bowel: The stomach is otherwise normal. Duodenum is within normal limits as well. Small bowel is unremarkable. Terminal ileum is within normal limits. Appendix is visualized and normal. The ascending and transverse colon are within normal limits. Descending colon is unremarkable. Diverticular  changes are present distally. Diverticular changes extend into the sigmoid colon. There is focal inflammation the distal sigmoid colon, above the rectum. Adjacent peripherally enhancing fluid collection in the left anatomic pelvis measures 5.1 x 2.2 x 5.9 cm. Vascular/Lymphatic: No significant adenopathy is present. Atherosclerotic calcifications are present in the aorta without aneurysm. Reproductive: Status post hysterectomy. No adnexal masses. Other: The a small amount of free fluid is present in the anatomic pelvis separate from the peripherally enhancing collection. There is no free air. No ventral hernia is present. Musculoskeletal: Left total hip arthroplasty is present. Bony pelvis is otherwise unremarkable. Degenerative grade 1 anterolisthesis is present at L4-5 with uncovering of a broad-based disc protrusion leading to central and bilateral foraminal narrowing. Central and bilateral foraminal narrowing is also present at L5-S1. IMPRESSION: 1. Sigmoid diverticular disease with inflammatory changes and pericolonic fluid collection likely reflecting acute diverticulitis complicated with abscess measuring 5.1 x 2.2 x 5.9 cm. 2. Additional free fluid in the anatomic pelvis is likely reactive. 3. No free air to suggest perforation. 4. Right lower lobe airspace disease concerning for pneumonia or aspiration. 5. Degenerative changes in the lower lumbar spine. These results were called by telephone at the time of interpretation on 06/04/2018 at 8:56 pm to Dr. Veryl Speak , who verbally acknowledged these results. Electronically Signed   By: San Morelle M.D.   On: 06/04/2018 20:56   Ct Image Guided Drainage By Percutaneous Catheter  Result Date: 06/05/2018 INDICATION: Left pelvic abscess concern for diverticulitis EXAM: CT guided left trans gluteal pelvic fluid collection drain MEDICATIONS: The patient is currently admitted to the hospital and receiving intravenous antibiotics. The antibiotics were  administered within an appropriate time frame prior to the initiation of the procedure. ANESTHESIA/SEDATION: Fentanyl 50 mcg IV; Versed 1.0 mg IV Moderate Sedation Time:  13 minutes The patient was continuously monitored during the procedure by the interventional radiology nurse under my direct supervision. COMPLICATIONS: None immediate. PROCEDURE: Informed written consent was obtained from the patient after a thorough discussion of the procedural risks, benefits and alternatives. All questions were addressed. Maximal Sterile Barrier Technique was utilized including caps, mask, sterile gowns, sterile gloves, sterile drape, hand hygiene and skin antiseptic. A timeout was performed prior to the initiation of the procedure. No previous imaging reviewed. Patient position prone. Noncontrast localization CT performed. The posterior left pelvic fluid collection was localized and marked for a left trans gluteal posterior approach. Under sterile conditions and local anesthesia, a 18 gauge 10 cm access needle was advanced from a left posterior trans gluteal approach into the fluid collection. Needle position confirmed with CT. Syringe aspiration yielded dark thin fluid suspicious for chronic hematoma. Sample sent for culture. Guidewire  inserted followed by tract dilatation to insert a 10 [place] drain. Drain catheter position confirmed with CT. Catheter secured with Prolene suture and connected to external suction bulb. Sterile dressing applied. No immediate complication. Patient tolerated the procedure well. IMPRESSION: Successful CT-guided left pelvic fluid collection drain placement as above. Electronically Signed   By: Jerilynn Mages.  Shick M.D.   On: 06/05/2018 16:09        Scheduled Meds: . atorvastatin  20 mg Oral Daily  . famotidine  40 mg Oral Daily  . feeding supplement  1 Container Oral TID BM  . fentaNYL      . [START ON 06/06/2018] heparin  5,000 Units Subcutaneous Q8H  . insulin aspart  0-9 Units Subcutaneous  TID WC  . lidocaine      . metoprolol succinate  100 mg Oral Daily  . midazolam      . multivitamin with minerals  1 tablet Oral Daily  . PARoxetine  40 mg Oral Daily  . sodium chloride flush  5 mL Intracatheter Q8H   Continuous Infusions: . sodium chloride Stopped (06/05/18 0545)  . piperacillin-tazobactam (ZOSYN)  IV 3.375 g (06/05/18 1344)     LOS: 1 day    Time spent: 38 minutes.     Hosie Poisson, MD [facility] Pager [phone]  If 7PM-7AM, please contact night-coverage [email or web address] Password [facility] 06/05/2018, 5:56 PM

## 2018-06-05 NOTE — Progress Notes (Addendum)
Initial Nutrition Assessment  DOCUMENTATION CODES:   Not applicable  INTERVENTION:   -Boost Breeze po TID, each supplement provides 250 kcal and 9 grams of protein -MVI with minerals daily -RD will follow for diet advancement and supplement diet as appropriate  NUTRITION DIAGNOSIS:   Inadequate oral intake related to altered GI function as evidenced by NPO status.  GOAL:   Patient will meet greater than or equal to 90% of their needs  MONITOR:   PO intake, Supplement acceptance, Diet advancement, Labs, Weight trends, Skin, I & O's  REASON FOR ASSESSMENT:   Malnutrition Screening Tool    ASSESSMENT:   Stephanie [name] is a 84 y.o. female with medical history significant of anxiety, type 2 diabetes, hypertension, hyperlipidemia, diverticulosis presenting to the hospital via EMS for evaluation of nausea and vomiting.  Patient states she had a colonoscopy done last week and 4 polyps were removed.  She was having blood in her stool prior to the colonoscopy and it even continued afterwards.  Reports noticing a small amount of blood coating her stool intermittently and dark stools intermittently since her colonoscopy.  Also reports having intermittent nausea and vomiting.  Her oral intake is decreased.  Reports having loose stools but no diarrhea.  Denies having abdominal pain.  States she has been coughing secondary to postnasal drip from her sinus congestion.  She denies having any shortness of breath or fevers.  Reports feeling cold.  Pt admitted with acute diverticulitis complicated by abscess.   Per general surgery notes,no plans for surgical intervention at this time. Plan for possible perc drain with IR (pt now NPO for possible procedure, however, was on clear liquid diet during RD visit).   Spoke with pt and two sons at bedside. Pt reports a general decline in health since July 2019, when she fractured her hip (pt became tearful when speaking about this; she shares she seems to  have one medical setback after another). Pt reports she typically has a pretty good appetite- she consumes 2 meals and one snack per day, which consists of grits or cold cereal and egg in the morning, graham crackers or banana with peanut butter at lunch, and chips and salsa for dinner. She also shares that her sons were considering ordering her some Ensure supplements for additional calories and protein.  Pt shares that for the past 3 days intake has been very poor due to abdominal pain ("I pretty much at nothing"). Pt also endorses weight loss during this time, but unable to quantify amount. Noted wt has been stable over the past year per wt hx.   Pt reports good tolerance of clear liquid diet. Pt consumed about 50% of chicken broth at breakfast and 100% of clear liquid tray for lunch. Pt amenable to supplement for increased calorie and protein intake. Discussed potential for diet progression as well as importance of good meal and supplement intake to promote healing.   Albumin has a half-life of 21 days and is strongly affected by stress response and inflammatory process, therefore, do not expect to see an improvement in this lab value during acute hospitalization. When a patient presents with low albumin, it is likely skewed due to the acute inflammatory response. Note that low albumin is no longer used to diagnose malnutrition; [facility] uses the new malnutrition guidelines published by the American Society for Parenteral and Enteral Nutrition (A.S.P.E.N.) and the Academy of Nutrition and Dietetics (AND).    Last Hgb A1c: 5.7 (06/05/18). PTA DM medications  are 10 mg glipizide daily and 1000 mg metformin BID.   Labs reviewed: CBGS: 75 (inpatient orders for glycemic control are 0-9 units insulin aspart TID with meals).   NUTRITION - FOCUSED PHYSICAL EXAM:    Most Recent Value  Orbital Region  Mild depletion  Upper Arm Region  Mild depletion  Thoracic and Lumbar Region  No depletion  Buccal  Region  No depletion  Temple Region  Mild depletion  Clavicle Bone Region  No depletion  Clavicle and Acromion Bone Region  No depletion  Scapular Bone Region  No depletion  Dorsal Hand  No depletion  Patellar Region  Moderate depletion  Anterior Thigh Region  Moderate depletion  Posterior Calf Region  Moderate depletion  Edema (RD Assessment)  None  Hair  Reviewed  Eyes  Reviewed  Mouth  Reviewed  Skin  Reviewed  Nails  Reviewed       Diet Order:   Diet Order            Diet NPO time specified Except for: Sips with Meds  Diet effective now              EDUCATION NEEDS:   Education needs have been addressed  Skin:  Skin Assessment: Reviewed RN Assessment  Last BM:  06/04/18  Height:   Ht Readings from Last 1 Encounters:  06/04/18 5\' 2"  (1.575 m)    Weight:   Wt Readings from Last 1 Encounters:  06/04/18 49 kg    Ideal Body Weight:  50 kg  BMI:  Body mass index is 19.75 kg/m.  Estimated Nutritional Needs:   Kcal:  1300-1500  Protein:  55-70 grams  Fluid:  1.3-1.5 L    Wilton Thrall A. Jimmye Norman, RD, LDN, CDE Pager: [phone] After hours Pager: [phone]

## 2018-06-05 NOTE — Consult Note (Signed)
Chief Complaint: Patient was seen in consultation today for diverticular abscess drain placement Chief Complaint  Patient presents with  . Nausea  . Emesis   at the request of Dr Leane Para   Supervising Physician: Daryll Brod  Patient Status: [facility] - In-pt  History of Present Illness: Stephanie [name] is a 84 y.o. female   Colonoscopy 1 week ago Colonoscopy 05/13/18 Dr. Tarri Glenn: diverticulosis in the sigmoid, descending, and ascending colon; 4 polyps at the hepatic flexure, descending colon and in the cecum as well as an ulcer in rectum biopsied  Admitted yesterday for abd pain Noted 1-2 weeks ago; loose stools Diverticular abscess  IMPRESSION: 1. Sigmoid diverticular disease with inflammatory changes and pericolonic fluid collection likely reflecting acute diverticulitis complicated with abscess measuring 5.1 x 2.2 x 5.9 cm. 2. Additional free fluid in the anatomic pelvis is likely reactive. 3. No free air to suggest perforation. 4. Right lower lobe airspace disease concerning for pneumonia or aspiration. 5. Degenerative changes in the lower lumbar spine.  Request for drain placement  1st bout of diverticulitis. Would not recommend any urgent surgical intervention at this time. Agree with IV zosyn and IR consult for possible percutaneous drain placement for abscess.  Imaging reviewed with Dr Lyndel Pleasure procedure   Past Medical History:  Diagnosis Date  . Allergy    allergic rhinitis  . Anxiety   . Arthritis    osteoarthritis  . Degenerative disk disease    in neck  . Diabetes mellitus    type II  . Fracture of femoral neck, left ([place]) 01/21/2018  . Hyperlipidemia   . Hypertension   . Hypoaldosteronism ([place]) 6/12   from DM causing hyperkalemia   . Osteopenia   . Periodontal disease   . Peripheral vascular disease ([place])   . Transient cerebral ischemia 11/06/2016    Past Surgical History:  Procedure Laterality Date  . CARDIAC CATHETERIZATION     x2  STENT  . CHOLECYSTECTOMY    . ESOPHAGOGASTRODUODENOSCOPY  03/2004   gastritis by biopsy  . GUM SURGERY  06/2010   laser surgery for gums  . HIP ARTHROPLASTY Left 01/21/2018   Procedure: ARTHROPLASTY BIPOLAR HIP (HEMIARTHROPLASTY);  Surgeon: Marchia Bond, MD;  Location: [place];  Service: Orthopedics;  Laterality: Left;  Marland Kitchen MM BREAST STEREO BX*L*R/S  12/12   normal  . TONSILLECTOMY    . TRANSTHORACIC ECHOCARDIOGRAM  11/2016   EF 55-60%, grade 1 DD with elevated LV end diastolic filling pressures, mildly dilated LA, mild TR and PR.   . TUBAL LIGATION      Allergies: Ace inhibitors; Amlodipine besy-benazepril hcl; Clopidogrel bisulfate; and Erythromycin  Medications: Prior to Admission medications   Medication Sig Start Date End Date Taking? Authorizing Provider  amLODipine (NORVASC) 5 MG tablet Take 1 tablet (5 mg total) by mouth daily. 02/16/18  Yes Tower, Wynelle Fanny, MD  atorvastatin (LIPITOR) 20 MG tablet TAKE 1 TABLET BY MOUTH ONCE DAILY Patient taking differently: Take 20 mg by mouth daily.  04/27/18  Yes Tower, Wynelle Fanny, MD  chlorthalidone (HYGROTON) 25 MG tablet Take 1 tablet (25 mg total) by mouth daily. 08/27/17  Yes Tower, Wynelle Fanny, MD  famotidine (PEPCID) 40 MG tablet Take 1 tablet (40 mg total) by mouth daily. 04/28/18  Yes Thornton Park, MD  glipiZIDE (GLIPIZIDE XL) 10 MG 24 hr tablet TAKE 1 TABLET BY MOUTH ONCE DAILY WITH BREAKFAST Patient taking differently: Take 10 mg by mouth daily with breakfast.  02/16/18  Yes Tower,  Wynelle Fanny, MD  metFORMIN (GLUCOPHAGE) 1000 MG tablet Take 1 tablet (1,000 mg total) by mouth 2 (two) times daily with a meal. 02/16/18  Yes Tower, Wynelle Fanny, MD  metoprolol succinate (TOPROL-XL) 100 MG 24 hr tablet TAKE 1 TABLET BY MOUTH ONCE DAILY TAKE  WITH  OR  IMMEDIATELY  FOLLOWING  A  MEAL Patient taking differently: Take 100 mg by mouth daily.  02/16/18  Yes Tower, Wynelle Fanny, MD  PARoxetine (PAXIL) 40 MG tablet Take 1 tablet (40 mg total) by mouth daily. 08/27/17   Yes Tower, Wynelle Fanny, MD  acetaminophen (TYLENOL) 325 MG tablet Take 2 tablets (650 mg total) by mouth every 6 (six) hours as needed. Patient not taking: Reported on 06/04/2018 01/21/18   Marchia Bond, MD  dipyridamole-aspirin [facility]) 200-25 MG 12hr capsule Take 1 capsule by mouth 2 (two) times daily. RESTART THIS IN 30days when you come off ELIQUIS Patient not taking: Reported on 06/04/2018 02/24/18   Domenic Polite, MD     Family History  Problem Relation Age of Onset  . Cancer Mother        vaginal CA in situ  . Hypertension Mother   . Heart disease Mother        CAD and MI  . Heart disease Father        CAD  . Diabetes Father   . Heart disease Son        congenital valve problem    Social History   Socioeconomic History  . Marital status: Married    Spouse name: Not on file  . Number of children: 3  . Years of education: Not on file  . Highest education level: Not on file  Occupational History  . Not on file  Social Needs  . Financial resource strain: Not on file  . Food insecurity:    Worry: Not on file    Inability: Not on file  . Transportation needs:    Medical: Not on file    Non-medical: Not on file  Tobacco Use  . Smoking status: Never Smoker  . Smokeless tobacco: Never Used  Substance and Sexual Activity  . Alcohol use: No    Alcohol/week: 0.0 standard drinks  . Drug use: No  . Sexual activity: Not on file  Lifestyle  . Physical activity:    Days per week: Not on file    Minutes per session: Not on file  . Stress: Not on file  Relationships  . Social connections:    Talks on phone: Not on file    Gets together: Not on file    Attends religious service: Not on file    Active member of club or organization: Not on file    Attends meetings of clubs or organizations: Not on file    Relationship status: Not on file  Other Topics Concern  . Not on file  Social History Narrative  . Not on file    Review of Systems: A 12 point ROS discussed and  pertinent positives are indicated in the HPI above.  All other systems are negative.  Review of Systems  Constitutional: Positive for activity change and fatigue. Negative for fever.  Gastrointestinal: Positive for abdominal pain, diarrhea and nausea.  Neurological: Positive for weakness.  Psychiatric/Behavioral: Negative for behavioral problems and confusion.    Vital Signs: BP (!) 152/56 (BP Location: Right Arm)   Pulse 78   Temp 97.8 F (36.6 C) (Oral)   Resp 16   Ht 5'  2" (1.575 m)   Wt 108 lb (49 kg)   SpO2 97%   BMI 19.75 kg/m   Physical Exam  Constitutional: She is oriented to person, place, and time.  Cardiovascular: Normal rate and regular rhythm.  Pulmonary/Chest: Effort normal and breath sounds normal.  Abdominal: Soft. Bowel sounds are normal. There is tenderness.  Musculoskeletal: Normal range of motion.  Neurological: She is alert and oriented to person, place, and time.  Skin: Skin is warm and dry.  Psychiatric: She has a normal mood and affect. Her behavior is normal. Judgment and thought content normal.  Vitals reviewed.   Imaging: Dg Chest 2 View  Result Date: 06/04/2018 CLINICAL DATA:  Weakness and cough x1 week, recent colonoscopy. EXAM: CHEST - 2 VIEW COMPARISON:  01/21/2018 FINDINGS: Normal heart size. Moderate aortic atherosclerosis at the arch without aneurysm. Chronic interstitial prominence is noted of the lungs without pulmonary consolidation, effusion pneumothorax. Calcified densities project over the lung bases compatible with soft tissue calcifications within the breasts on prior exam. There appears be a large eggshell calcification in the left upper quadrant of the abdomen measuring approximately 2.6 x 1.8 cm on the frontal view possibly representing a calcified splenic artery aneurysm, calcified splenic cyst or calcified diverticulum. IMPRESSION: 1. No active pulmonary disease. Aortic atherosclerosis. 2. Chronic interstitial prominence is noted of  the lungs without pulmonary consolidation, effusion, pneumothorax. Electronically Signed   By: Ashley Royalty M.D.   On: 06/04/2018 19:43   Ct Abdomen Pelvis W Contrast  Result Date: 06/04/2018 CLINICAL DATA:  Abdominal pain, acute, generalized. Colonoscopy 1 week ago. Dark red blood in stool. Nausea, vomiting, and diarrhea. EXAM: CT ABDOMEN AND PELVIS WITH CONTRAST TECHNIQUE: Multidetector CT imaging of the abdomen and pelvis was performed using the standard protocol following bolus administration of intravenous contrast. CONTRAST:  146mL OMNIPAQUE IOHEXOL 300 MG/ML  SOLN COMPARISON:  CT of the abdomen and pelvis 9/12/5 FINDINGS: Lower chest: Posteromedial right lower lobe airspace disease is concerning for infection or aspiration. Minimal atelectasis is present at the right base. The heart size is normal. A small hiatal hernia is present. No significant pleural or pericardial effusion is present. Hepatobiliary: No focal liver abnormality is seen. Status post cholecystectomy. No biliary dilatation. Pancreas: Unremarkable. No pancreatic ductal dilatation or surrounding inflammatory changes. Spleen: Normal in size without focal abnormality. Adrenals/Urinary Tract: Adrenal glands are normal bilaterally. Kidneys and ureters are within normal limits. There is no stone or mass lesion. No obstruction is present. The urinary bladder is within normal limits. Stomach/Bowel: The stomach is otherwise normal. Duodenum is within normal limits as well. Small bowel is unremarkable. Terminal ileum is within normal limits. Appendix is visualized and normal. The ascending and transverse colon are within normal limits. Descending colon is unremarkable. Diverticular changes are present distally. Diverticular changes extend into the sigmoid colon. There is focal inflammation the distal sigmoid colon, above the rectum. Adjacent peripherally enhancing fluid collection in the left anatomic pelvis measures 5.1 x 2.2 x 5.9 cm.  Vascular/Lymphatic: No significant adenopathy is present. Atherosclerotic calcifications are present in the aorta without aneurysm. Reproductive: Status post hysterectomy. No adnexal masses. Other: The a small amount of free fluid is present in the anatomic pelvis separate from the peripherally enhancing collection. There is no free air. No ventral hernia is present. Musculoskeletal: Left total hip arthroplasty is present. Bony pelvis is otherwise unremarkable. Degenerative grade 1 anterolisthesis is present at L4-5 with uncovering of a broad-based disc protrusion leading to central and bilateral foraminal narrowing.  Central and bilateral foraminal narrowing is also present at L5-S1. IMPRESSION: 1. Sigmoid diverticular disease with inflammatory changes and pericolonic fluid collection likely reflecting acute diverticulitis complicated with abscess measuring 5.1 x 2.2 x 5.9 cm. 2. Additional free fluid in the anatomic pelvis is likely reactive. 3. No free air to suggest perforation. 4. Right lower lobe airspace disease concerning for pneumonia or aspiration. 5. Degenerative changes in the lower lumbar spine. These results were called by telephone at the time of interpretation on 06/04/2018 at 8:56 pm to Dr. Veryl Speak , who verbally acknowledged these results. Electronically Signed   By: San Morelle M.D.   On: 06/04/2018 20:56    Labs:  CBC: Recent Labs    01/24/18 0346 02/09/18 0856 06/04/18 1819 06/05/18 0306  WBC 15.0* 10.7* 14.2* 12.3*  HGB 11.8* 12.8 9.7* 9.2*  HCT 34.7* 37.2 29.5* 27.7*  PLT 167 424.0* 251 260    COAGS: Recent Labs    01/21/18 1323  INR 1.09    BMP: Recent Labs    01/23/18 0526 01/24/18 0346 02/09/18 0856 06/04/18 1819 06/05/18 1156  NA 136 136 134* 131* 132*  K 3.9 3.9 4.9 3.4* 3.1*  CL 100 101 100 100 104  CO2 24 25 23  20* 19*  GLUCOSE 207* 236* 184* 147* 162*  BUN 25* 19 32* 35* 30*  CALCIUM 8.3* 8.8* 10.2 8.2* 8.2*  CREATININE 1.12* 1.02*  1.04 1.27* 1.04*  GFRNONAA 45* 50*  --  38* 49*  GFRAA 52* 58*  --  44* 56*    LIVER FUNCTION TESTS: Recent Labs    08/27/17 1251 02/09/18 0856 06/04/18 1819  BILITOT 0.4 0.5 0.6  AST 14 14 36  ALT 6 7 10   ALKPHOS 46 89 51  PROT 7.2 7.3 6.0*  ALBUMIN 4.3 3.9 2.6*    TUMOR MARKERS: No results for input(s): AFPTM, CEA, CA199, CHROMGRNA in the last 8760 hours.  Assessment and Plan:  Diverticular abscess For drain placement Risks and benefits discussed with the patient including bleeding, infection, damage to adjacent structures, bowel perforation/fistula connection, and sepsis.  All of the patient's questions were answered, patient is agreeable to proceed. Consent signed and in chart.   Thank you for this interesting consult.  I greatly enjoyed meeting KEILA TURAN and look forward to participating in their care.  A copy of this report was sent to the requesting provider on this date.  Electronically Signed: Lavonia Drafts, PA-C 06/05/2018, 1:43 PM   I spent a total of 40 Minutes    in face to face in clinical consultation, greater than 50% of which was counseling/coordinating care for divertic abscess drain

## 2018-06-05 NOTE — Procedures (Signed)
Pelvic abscess  S/P CT DRAIN LEFT TRANSGLUTEAL NO COMP STABLE EBL MIN CX SENT FULL REPORT IN PACS

## 2018-06-06 LAB — BASIC METABOLIC PANEL
ANION GAP: 10 (ref 5–15)
BUN: 23 mg/dL (ref 8–23)
CALCIUM: 8.2 mg/dL — AB (ref 8.9–10.3)
CO2: 19 mmol/L — ABNORMAL LOW (ref 22–32)
Chloride: 101 mmol/L (ref 98–111)
Creatinine, Ser: 1.18 mg/dL — ABNORMAL HIGH (ref 0.44–1.00)
GFR calc non Af Amer: 42 mL/min — ABNORMAL LOW (ref >60)
GFR, EST AFRICAN AMERICAN: 48 mL/min — AB (ref >60)
Glucose, Bld: 127 mg/dL — ABNORMAL HIGH (ref 70–99)
Potassium: 3.2 mmol/L — ABNORMAL LOW (ref 3.5–5.1)
SODIUM: 130 mmol/L — AB (ref 135–145)

## 2018-06-06 LAB — CBC
HCT: 26.8 % — ABNORMAL LOW (ref 36.0–46.0)
Hemoglobin: 8.7 g/dL — ABNORMAL LOW (ref 12.0–15.0)
MCH: 30.1 pg (ref 26.0–34.0)
MCHC: 32.5 g/dL (ref 30.0–36.0)
MCV: 92.7 fL (ref 80.0–100.0)
NRBC: 0 % (ref 0.0–0.2)
PLATELETS: 255 10*3/uL (ref 150–400)
RBC: 2.89 MIL/uL — AB (ref 3.87–5.11)
RDW: 12.9 % (ref 11.5–15.5)
WBC: 9 10*3/uL (ref 4.0–10.5)

## 2018-06-06 LAB — FOLATE: FOLATE: 30.4 ng/mL (ref >5.9)

## 2018-06-06 LAB — IRON AND TIBC
IRON: 22 ug/dL — AB (ref 28–170)
Saturation Ratios: 11 % (ref 10.4–31.8)
TIBC: 193 ug/dL — ABNORMAL LOW (ref 250–450)
UIBC: 171 ug/dL

## 2018-06-06 LAB — RETICULOCYTES
IMMATURE RETIC FRACT: 14.2 % (ref 2.3–15.9)
RBC.: 2.89 MIL/uL — ABNORMAL LOW (ref 3.87–5.11)
Retic Count, Absolute: 30.3 10*3/uL (ref 19.0–186.0)
Retic Ct Pct: 1.1 % (ref 0.4–3.1)

## 2018-06-06 LAB — GLUCOSE, CAPILLARY
GLUCOSE-CAPILLARY: 223 mg/dL — AB (ref 70–99)
Glucose-Capillary: 153 mg/dL — ABNORMAL HIGH (ref 70–99)
Glucose-Capillary: 99 mg/dL (ref 70–99)
Glucose-Capillary: 140 mg/dL — ABNORMAL HIGH (ref 70–99)

## 2018-06-06 LAB — PHOSPHORUS: Phosphorus: 2.3 mg/dL — ABNORMAL LOW (ref 2.5–4.6)

## 2018-06-06 LAB — MAGNESIUM: MAGNESIUM: 1.6 mg/dL — AB (ref 1.7–2.4)

## 2018-06-06 LAB — FERRITIN: FERRITIN: 358 ng/mL — AB (ref 11–307)

## 2018-06-06 LAB — VITAMIN B12: VITAMIN B 12: 58 pg/mL — AB (ref 180–914)

## 2018-06-06 MED ORDER — POTASSIUM CHLORIDE 10 MEQ/100ML IV SOLN
10.0000 meq | INTRAVENOUS | Status: AC
  Administered 2018-06-06 (×2): 10 meq via INTRAVENOUS
  Filled 2018-06-06 (×2): qty 100

## 2018-06-06 MED ORDER — SODIUM CHLORIDE 0.9 % IV SOLN
INTRAVENOUS | Status: DC
  Administered 2018-06-06 – 2018-06-08 (×3): via INTRAVENOUS

## 2018-06-06 MED ORDER — FERROUS SULFATE 325 (65 FE) MG PO TABS
325.0000 mg | ORAL_TABLET | Freq: Every day | ORAL | Status: DC
  Administered 2018-06-07 – 2018-06-10 (×4): 325 mg via ORAL
  Filled 2018-06-06 (×4): qty 1

## 2018-06-06 MED ORDER — CYANOCOBALAMIN 1000 MCG/ML IJ SOLN
1000.0000 ug | Freq: Every day | INTRAMUSCULAR | Status: DC
  Administered 2018-06-06 – 2018-06-09 (×4): 1000 ug via INTRAMUSCULAR
  Filled 2018-06-06 (×5): qty 1

## 2018-06-06 NOTE — Progress Notes (Signed)
Assessment & Plan: HD#3 - acute diverticulitis with pericolonic abscess  IR drainage performed yesterday - await cultures  Currently NPO - will begin clear liquid diet today  Monitor diarrhea  IV Zosyn  Will follow with you.        Stephanie Gemma, MD       [facility]       Office: [phone]   Chief Complaint: Acute diverticulitis with abscess  Subjective: Patient in bed.  Complains of diarrhea.  Husband at bedside.  Would like to eat.  Objective: Vital signs in last 24 hours: Temp:  [97.1 F (36.2 C)-98.5 F (36.9 C)] 97.1 F (36.2 C) (11/23 0529) Pulse Rate:  [63-74] 66 (11/23 0529) Resp:  [16-18] 16 (11/23 0529) BP: (110-155)/(47-84) 155/59 (11/23 0529) SpO2:  [90 %-100 %] 98 % (11/23 0529) Last BM Date: 06/04/18  Intake/Output from previous day: 11/22 0701 - 11/23 0700 In: 279.1 [I.V.:19.2; IV Piggyback:256.9] Out: 15 [Drains:15] Intake/Output this shift: No intake/output data recorded.  Physical Exam: HEENT - sclerae clear, mucous membranes moist Neck - soft Chest - clear bilaterally Cor - RRR Abdomen - soft without distension; mild tenderness LLQ; drain with serosanguinous, dark, output, small Ext - no edema, non-tender Neuro - alert & oriented, no focal deficits  Lab Results:  Recent Labs    06/05/18 0306 06/06/18 0441  WBC 12.3* 9.0  HGB 9.2* 8.7*  HCT 27.7* 26.8*  PLT 260 255   BMET Recent Labs    06/05/18 1156 06/06/18 0441  NA 132* 130*  K 3.1* 3.2*  CL 104 101  CO2 19* 19*  GLUCOSE 162* 127*  BUN 30* 23  CREATININE 1.04* 1.18*  CALCIUM 8.2* 8.2*   PT/INR Recent Labs    06/05/18 1400  LABPROT 14.6  INR 1.15   Comprehensive Metabolic Panel:    Component Value Date/Time   NA 130 (L) 06/06/2018 0441   NA 132 (L) 06/05/2018 1156   K 3.2 (L) 06/06/2018 0441   K 3.1 (L) 06/05/2018 1156   CL 101 06/06/2018 0441   CL 104 06/05/2018 1156   CO2 19 (L) 06/06/2018 0441   CO2 19 (L) 06/05/2018 1156   BUN 23 06/06/2018 0441   BUN 30 (H) 06/05/2018 1156   CREATININE 1.18 (H) 06/06/2018 0441   CREATININE 1.04 (H) 06/05/2018 1156   CREATININE 0.88 11/27/2010 0916   GLUCOSE 127 (H) 06/06/2018 0441   GLUCOSE 162 (H) 06/05/2018 1156   CALCIUM 8.2 (L) 06/06/2018 0441   CALCIUM 8.2 (L) 06/05/2018 1156   AST 36 06/04/2018 1819   AST 14 02/09/2018 0856   ALT 10 06/04/2018 1819   ALT 7 02/09/2018 0856   ALKPHOS 51 06/04/2018 1819   ALKPHOS 89 02/09/2018 0856   BILITOT 0.6 06/04/2018 1819   BILITOT 0.5 02/09/2018 0856   PROT 6.0 (L) 06/04/2018 1819   PROT 7.3 02/09/2018 0856   ALBUMIN 2.6 (L) 06/04/2018 1819   ALBUMIN 3.9 02/09/2018 0856    Studies/Results: Dg Chest 2 View  Result Date: 06/04/2018 CLINICAL DATA:  Weakness and cough x1 week, recent colonoscopy. EXAM: CHEST - 2 VIEW COMPARISON:  01/21/2018 FINDINGS: Normal heart size. Moderate aortic atherosclerosis at the arch without aneurysm. Chronic interstitial prominence is noted of the lungs without pulmonary consolidation, effusion pneumothorax. Calcified densities project over the lung bases compatible with soft tissue calcifications within the breasts on prior exam. There appears be a large eggshell calcification in the left upper quadrant of the abdomen measuring  approximately 2.6 x 1.8 cm on the frontal view possibly representing a calcified splenic artery aneurysm, calcified splenic cyst or calcified diverticulum. IMPRESSION: 1. No active pulmonary disease. Aortic atherosclerosis. 2. Chronic interstitial prominence is noted of the lungs without pulmonary consolidation, effusion, pneumothorax. Electronically Signed   By: Ashley Royalty M.D.   On: 06/04/2018 19:43   Ct Abdomen Pelvis W Contrast  Result Date: 06/04/2018 CLINICAL DATA:  Abdominal pain, acute, generalized. Colonoscopy 1 week ago. Dark red blood in stool. Nausea, vomiting, and diarrhea. EXAM: CT ABDOMEN AND PELVIS WITH CONTRAST TECHNIQUE: Multidetector CT imaging of the  abdomen and pelvis was performed using the standard protocol following bolus administration of intravenous contrast. CONTRAST:  185mL OMNIPAQUE IOHEXOL 300 MG/ML  SOLN COMPARISON:  CT of the abdomen and pelvis 9/12/5 FINDINGS: Lower chest: Posteromedial right lower lobe airspace disease is concerning for infection or aspiration. Minimal atelectasis is present at the right base. The heart size is normal. A small hiatal hernia is present. No significant pleural or pericardial effusion is present. Hepatobiliary: No focal liver abnormality is seen. Status post cholecystectomy. No biliary dilatation. Pancreas: Unremarkable. No pancreatic ductal dilatation or surrounding inflammatory changes. Spleen: Normal in size without focal abnormality. Adrenals/Urinary Tract: Adrenal glands are normal bilaterally. Kidneys and ureters are within normal limits. There is no stone or mass lesion. No obstruction is present. The urinary bladder is within normal limits. Stomach/Bowel: The stomach is otherwise normal. Duodenum is within normal limits as well. Small bowel is unremarkable. Terminal ileum is within normal limits. Appendix is visualized and normal. The ascending and transverse colon are within normal limits. Descending colon is unremarkable. Diverticular changes are present distally. Diverticular changes extend into the sigmoid colon. There is focal inflammation the distal sigmoid colon, above the rectum. Adjacent peripherally enhancing fluid collection in the left anatomic pelvis measures 5.1 x 2.2 x 5.9 cm. Vascular/Lymphatic: No significant adenopathy is present. Atherosclerotic calcifications are present in the aorta without aneurysm. Reproductive: Status post hysterectomy. No adnexal masses. Other: The a small amount of free fluid is present in the anatomic pelvis separate from the peripherally enhancing collection. There is no free air. No ventral hernia is present. Musculoskeletal: Left total hip arthroplasty is present.  Bony pelvis is otherwise unremarkable. Degenerative grade 1 anterolisthesis is present at L4-5 with uncovering of a broad-based disc protrusion leading to central and bilateral foraminal narrowing. Central and bilateral foraminal narrowing is also present at L5-S1. IMPRESSION: 1. Sigmoid diverticular disease with inflammatory changes and pericolonic fluid collection likely reflecting acute diverticulitis complicated with abscess measuring 5.1 x 2.2 x 5.9 cm. 2. Additional free fluid in the anatomic pelvis is likely reactive. 3. No free air to suggest perforation. 4. Right lower lobe airspace disease concerning for pneumonia or aspiration. 5. Degenerative changes in the lower lumbar spine. These results were called by telephone at the time of interpretation on 06/04/2018 at 8:56 pm to Dr. Veryl Speak , who verbally acknowledged these results. Electronically Signed   By: San Morelle M.D.   On: 06/04/2018 20:56   Ct Image Guided Drainage By Percutaneous Catheter  Result Date: 06/05/2018 INDICATION: Left pelvic abscess concern for diverticulitis EXAM: CT guided left trans gluteal pelvic fluid collection drain MEDICATIONS: The patient is currently admitted to the hospital and receiving intravenous antibiotics. The antibiotics were administered within an appropriate time frame prior to the initiation of the procedure. ANESTHESIA/SEDATION: Fentanyl 50 mcg IV; Versed 1.0 mg IV Moderate Sedation Time:  13 minutes The patient was continuously monitored  during the procedure by the interventional radiology nurse under my direct supervision. COMPLICATIONS: None immediate. PROCEDURE: Informed written consent was obtained from the patient after a thorough discussion of the procedural risks, benefits and alternatives. All questions were addressed. Maximal Sterile Barrier Technique was utilized including caps, mask, sterile gowns, sterile gloves, sterile drape, hand hygiene and skin antiseptic. A timeout was performed  prior to the initiation of the procedure. No previous imaging reviewed. Patient position prone. Noncontrast localization CT performed. The posterior left pelvic fluid collection was localized and marked for a left trans gluteal posterior approach. Under sterile conditions and local anesthesia, a 18 gauge 10 cm access needle was advanced from a left posterior trans gluteal approach into the fluid collection. Needle position confirmed with CT. Syringe aspiration yielded dark thin fluid suspicious for chronic hematoma. Sample sent for culture. Guidewire inserted followed by tract dilatation to insert a 10 [place] drain. Drain catheter position confirmed with CT. Catheter secured with Prolene suture and connected to external suction bulb. Sterile dressing applied. No immediate complication. Patient tolerated the procedure well. IMPRESSION: Successful CT-guided left pelvic fluid collection drain placement as above. Electronically Signed   By: Jerilynn Mages.  Shick M.D.   On: 06/05/2018 16:09      Abeer Deskins M 06/06/2018  Patient ID: Stephanie [name], female   DOB: [birth year], 84 y.o.   MRN: [id number]

## 2018-06-06 NOTE — Progress Notes (Signed)
Referring Physician(s): Dr Leane Para  Supervising Physician: Daryll Brod  Patient Status:  [facility] - In-pt  Chief Complaint:  Left TG drain placed 11/22  Subjective:  divertic abscess Feels better after drain placement OP bloody 15 cc yesterday 10 cc in JP  Allergies: Ace inhibitors; Amlodipine besy-benazepril hcl; Clopidogrel bisulfate; and Erythromycin  Medications: Prior to Admission medications   Medication Sig Start Date End Date Taking? Authorizing Provider  amLODipine (NORVASC) 5 MG tablet Take 1 tablet (5 mg total) by mouth daily. 02/16/18  Yes Tower, Wynelle Fanny, MD  atorvastatin (LIPITOR) 20 MG tablet TAKE 1 TABLET BY MOUTH ONCE DAILY Patient taking differently: Take 20 mg by mouth daily.  04/27/18  Yes Tower, Wynelle Fanny, MD  chlorthalidone (HYGROTON) 25 MG tablet Take 1 tablet (25 mg total) by mouth daily. 08/27/17  Yes Tower, Wynelle Fanny, MD  famotidine (PEPCID) 40 MG tablet Take 1 tablet (40 mg total) by mouth daily. 04/28/18  Yes Thornton Park, MD  glipiZIDE (GLIPIZIDE XL) 10 MG 24 hr tablet TAKE 1 TABLET BY MOUTH ONCE DAILY WITH BREAKFAST Patient taking differently: Take 10 mg by mouth daily with breakfast.  02/16/18  Yes Tower, Wynelle Fanny, MD  metFORMIN (GLUCOPHAGE) 1000 MG tablet Take 1 tablet (1,000 mg total) by mouth 2 (two) times daily with a meal. 02/16/18  Yes Tower, Marne A, MD  metoprolol succinate (TOPROL-XL) 100 MG 24 hr tablet TAKE 1 TABLET BY MOUTH ONCE DAILY TAKE  WITH  OR  IMMEDIATELY  FOLLOWING  A  MEAL Patient taking differently: Take 100 mg by mouth daily.  02/16/18  Yes Tower, Wynelle Fanny, MD  PARoxetine (PAXIL) 40 MG tablet Take 1 tablet (40 mg total) by mouth daily. 08/27/17  Yes Tower, Wynelle Fanny, MD  acetaminophen (TYLENOL) 325 MG tablet Take 2 tablets (650 mg total) by mouth every 6 (six) hours as needed. Patient not taking: Reported on 06/04/2018 01/21/18   Marchia Bond, MD  dipyridamole-aspirin [facility]) 200-25 MG 12hr capsule Take 1 capsule by mouth 2 (two)  times daily. RESTART THIS IN 30days when you come off ELIQUIS Patient not taking: Reported on 06/04/2018 02/24/18   Domenic Polite, MD     Vital Signs: BP (!) 155/59 (BP Location: Left Arm)   Pulse 66   Temp (!) 97.1 F (36.2 C) (Oral)   Resp 16   Ht 5\' 2"  (1.575 m)   Wt 108 lb (49 kg)   SpO2 98%   BMI 19.75 kg/m   Physical Exam  Constitutional: She is oriented to person, place, and time.  Abdominal: Soft.  Neurological: She is alert and oriented to person, place, and time.  Skin: Skin is warm and dry.  Site is clean and dry NT No bleeding OP bloody 10 cc in JP  Vitals reviewed.   Imaging: Dg Chest 2 View  Result Date: 06/04/2018 CLINICAL DATA:  Weakness and cough x1 week, recent colonoscopy. EXAM: CHEST - 2 VIEW COMPARISON:  01/21/2018 FINDINGS: Normal heart size. Moderate aortic atherosclerosis at the arch without aneurysm. Chronic interstitial prominence is noted of the lungs without pulmonary consolidation, effusion pneumothorax. Calcified densities project over the lung bases compatible with soft tissue calcifications within the breasts on prior exam. There appears be a large eggshell calcification in the left upper quadrant of the abdomen measuring approximately 2.6 x 1.8 cm on the frontal view possibly representing a calcified splenic artery aneurysm, calcified splenic cyst or calcified diverticulum. IMPRESSION: 1. No active pulmonary disease. Aortic atherosclerosis. 2. Chronic  interstitial prominence is noted of the lungs without pulmonary consolidation, effusion, pneumothorax. Electronically Signed   By: Ashley Royalty M.D.   On: 06/04/2018 19:43   Ct Abdomen Pelvis W Contrast  Result Date: 06/04/2018 CLINICAL DATA:  Abdominal pain, acute, generalized. Colonoscopy 1 week ago. Dark red blood in stool. Nausea, vomiting, and diarrhea. EXAM: CT ABDOMEN AND PELVIS WITH CONTRAST TECHNIQUE: Multidetector CT imaging of the abdomen and pelvis was performed using the standard  protocol following bolus administration of intravenous contrast. CONTRAST:  118mL OMNIPAQUE IOHEXOL 300 MG/ML  SOLN COMPARISON:  CT of the abdomen and pelvis 9/12/5 FINDINGS: Lower chest: Posteromedial right lower lobe airspace disease is concerning for infection or aspiration. Minimal atelectasis is present at the right base. The heart size is normal. A small hiatal hernia is present. No significant pleural or pericardial effusion is present. Hepatobiliary: No focal liver abnormality is seen. Status post cholecystectomy. No biliary dilatation. Pancreas: Unremarkable. No pancreatic ductal dilatation or surrounding inflammatory changes. Spleen: Normal in size without focal abnormality. Adrenals/Urinary Tract: Adrenal glands are normal bilaterally. Kidneys and ureters are within normal limits. There is no stone or mass lesion. No obstruction is present. The urinary bladder is within normal limits. Stomach/Bowel: The stomach is otherwise normal. Duodenum is within normal limits as well. Small bowel is unremarkable. Terminal ileum is within normal limits. Appendix is visualized and normal. The ascending and transverse colon are within normal limits. Descending colon is unremarkable. Diverticular changes are present distally. Diverticular changes extend into the sigmoid colon. There is focal inflammation the distal sigmoid colon, above the rectum. Adjacent peripherally enhancing fluid collection in the left anatomic pelvis measures 5.1 x 2.2 x 5.9 cm. Vascular/Lymphatic: No significant adenopathy is present. Atherosclerotic calcifications are present in the aorta without aneurysm. Reproductive: Status post hysterectomy. No adnexal masses. Other: The a small amount of free fluid is present in the anatomic pelvis separate from the peripherally enhancing collection. There is no free air. No ventral hernia is present. Musculoskeletal: Left total hip arthroplasty is present. Bony pelvis is otherwise unremarkable. Degenerative  grade 1 anterolisthesis is present at L4-5 with uncovering of a broad-based disc protrusion leading to central and bilateral foraminal narrowing. Central and bilateral foraminal narrowing is also present at L5-S1. IMPRESSION: 1. Sigmoid diverticular disease with inflammatory changes and pericolonic fluid collection likely reflecting acute diverticulitis complicated with abscess measuring 5.1 x 2.2 x 5.9 cm. 2. Additional free fluid in the anatomic pelvis is likely reactive. 3. No free air to suggest perforation. 4. Right lower lobe airspace disease concerning for pneumonia or aspiration. 5. Degenerative changes in the lower lumbar spine. These results were called by telephone at the time of interpretation on 06/04/2018 at 8:56 pm to Dr. Veryl Speak , who verbally acknowledged these results. Electronically Signed   By: San Morelle M.D.   On: 06/04/2018 20:56   Ct Image Guided Drainage By Percutaneous Catheter  Result Date: 06/05/2018 INDICATION: Left pelvic abscess concern for diverticulitis EXAM: CT guided left trans gluteal pelvic fluid collection drain MEDICATIONS: The patient is currently admitted to the hospital and receiving intravenous antibiotics. The antibiotics were administered within an appropriate time frame prior to the initiation of the procedure. ANESTHESIA/SEDATION: Fentanyl 50 mcg IV; Versed 1.0 mg IV Moderate Sedation Time:  13 minutes The patient was continuously monitored during the procedure by the interventional radiology nurse under my direct supervision. COMPLICATIONS: None immediate. PROCEDURE: Informed written consent was obtained from the patient after a thorough discussion of the procedural risks,  benefits and alternatives. All questions were addressed. Maximal Sterile Barrier Technique was utilized including caps, mask, sterile gowns, sterile gloves, sterile drape, hand hygiene and skin antiseptic. A timeout was performed prior to the initiation of the procedure. No  previous imaging reviewed. Patient position prone. Noncontrast localization CT performed. The posterior left pelvic fluid collection was localized and marked for a left trans gluteal posterior approach. Under sterile conditions and local anesthesia, a 18 gauge 10 cm access needle was advanced from a left posterior trans gluteal approach into the fluid collection. Needle position confirmed with CT. Syringe aspiration yielded dark thin fluid suspicious for chronic hematoma. Sample sent for culture. Guidewire inserted followed by tract dilatation to insert a 10 [place] drain. Drain catheter position confirmed with CT. Catheter secured with Prolene suture and connected to external suction bulb. Sterile dressing applied. No immediate complication. Patient tolerated the procedure well. IMPRESSION: Successful CT-guided left pelvic fluid collection drain placement as above. Electronically Signed   By: Jerilynn Mages.  Shick M.D.   On: 06/05/2018 16:09    Labs:  CBC: Recent Labs    02/09/18 0856 06/04/18 1819 06/05/18 0306 06/06/18 0441  WBC 10.7* 14.2* 12.3* 9.0  HGB 12.8 9.7* 9.2* 8.7*  HCT 37.2 29.5* 27.7* 26.8*  PLT 424.0* 251 260 255    COAGS: Recent Labs    01/21/18 1323 06/05/18 1400  INR 1.09 1.15    BMP: Recent Labs    01/24/18 0346 02/09/18 0856 06/04/18 1819 06/05/18 1156 06/06/18 0441  NA 136 134* 131* 132* 130*  K 3.9 4.9 3.4* 3.1* 3.2*  CL 101 100 100 104 101  CO2 25 23 20* 19* 19*  GLUCOSE 236* 184* 147* 162* 127*  BUN 19 32* 35* 30* 23  CALCIUM 8.8* 10.2 8.2* 8.2* 8.2*  CREATININE 1.02* 1.04 1.27* 1.04* 1.18*  GFRNONAA 50*  --  38* 49* 42*  GFRAA 58*  --  44* 56* 48*    LIVER FUNCTION TESTS: Recent Labs    08/27/17 1251 02/09/18 0856 06/04/18 1819  BILITOT 0.4 0.5 0.6  AST 14 14 36  ALT 6 7 10   ALKPHOS 46 89 51  PROT 7.2 7.3 6.0*  ALBUMIN 4.3 3.9 2.6*    Assessment and Plan:  divertic abscess Drain intact OP bloody Wbc coming down Will  follow  Electronically Signed: Ellia Knowlton A, PA-C 06/06/2018, 12:45 PM   I spent a total of 15 Minutes at the the patient's bedside AND on the patient's hospital floor or unit, greater than 50% of which was counseling/coordinating care for divertic abscess-- TG drain

## 2018-06-06 NOTE — Progress Notes (Signed)
PROGRESS NOTE    Stephanie [name]  UXN:[id number] DOB: 1933/09/13 DOA: 06/04/2018 PCP: Abner Greenspan, MD    Brief Narrative:  Stephanie [name] is a 84 y.o. female with medical history significant of anxiety, type 2 diabetes, hypertension, hyperlipidemia, diverticulosis presenting to the hospital via EMS for evaluation of nausea and vomiting.  CT abdomen pelvis with evidence of acute diverticulitis implicated with an abscess measuring 5.1 x 2.2 x 5.9 cm, free fluid in the pelvis thought to be likely reactive.  No free air to suggest perforation.  CT showing right lower lobe airspace disease concerning for pneumonia or aspiration. She was started on IV zosyn and IV fluids and admitted to [facility] service.   Assessment & Plan:   Principal Problem:   Colonic diverticular abscess Active Problems:   Type 2 diabetes mellitus (HCC)   HTN (hypertension)   Depression   HLD (hyperlipidemia)   GERD (gastroesophageal reflux disease)   Diverticulitis   Anemia   Protein calorie malnutrition ([place])   Acute diverticulitis with abscess formation;  S/p IR guided perc drainage of the abscess.  Output about 10 mL so far and slightly bloody.  Cultures are pending No nausea, or vomiting , abd pain is better but she continues to have loose bowel movements. Currently NPO. Gently hydrate with normal saline and continue with IV zosyn. Surgery consulted and appreciate recommendations.  We will start her on clear liquid diet today and advance as tolerated.   Anemia of chronic disease: Baseline around 12.  Hemoglobin at 8.7, anemia panel reveals low iron level with vitamin B12 levels. Transfuse to keep hemoglobin greater than 7   Questionable pneumonia on CT abdomen and pelvis: Pt reports cough only and some sob.  Continue with IV zosyn.    Type 2 DM: CBG (last 3)  Recent Labs    06/05/18 2220 06/06/18 0805 06/06/18 1235  GLUCAP 120* 153* 48   Well controlled.  A1c is 5.7.  Resume SSI, no change in  medications   Hyperlipidemia:  Resume lipitor    Hypertension:  Well controlled.    Depression:  Stable.    AKI Probably prerenal in etiology. started the patient on normal saline at 100 mL/h repeat renal para meters in the morning.   DVT prophylaxis: scd's Code Status: full code.  Family Communication: family at bedside.  Disposition Plan: pending clinical improvement.    Consultants:   IR  SURGERY.   Procedures: Percutaneous drain placement.   Antimicrobials:IV zosyn since admission.   Subjective: Patient denies any nausea vomiting and abdominal pain appears to have improved.  She reports her cough is better.  Objective: Vitals:   06/05/18 1807 06/05/18 2223 06/06/18 0529 06/06/18 1414  BP: (!) 122/59 (!) 130/54 (!) 155/59 (!) 145/66  Pulse: 63 65 66 64  Resp: 18 16 16    Temp: 97.7 F (36.5 C) 98.1 F (36.7 C) (!) 97.1 F (36.2 C) 98.4 F (36.9 C)  TempSrc: Oral Oral Oral Oral  SpO2: 97% 97% 98% 95%  Weight:      Height:        Intake/Output Summary (Last 24 hours) at 06/06/2018 1611 Last data filed at 06/06/2018 1400 Gross per 24 hour  Intake 184.08 ml  Output 15 ml  Net 169.08 ml   Filed Weights   06/04/18 1734  Weight: 49 kg    Examination:  General exam: Appears calm and comfortable no distress noted Respiratory system: Clear to auscultation. Respiratory effort normal.  No wheezing or  rhonchi Cardiovascular system: S1 & S2 heard, RRR. No JVD,  . No pedal edema.  Murmur present Gastrointestinal system: Abdomen is soft, nontender, nondistended, PERC drain site looks okay Central nervous system: Alert and oriented. Nonfocal.  Extremities: Symmetric 5 x 5 power.  Pedal edema Skin: No rashes, lesions or ulcers Psychiatry: Mood & affect appropriate.     Data Reviewed: I have personally reviewed following labs and imaging studies  CBC: Recent Labs  Lab 06/04/18 1819 06/05/18 0306 06/06/18 0441  WBC 14.2* 12.3* 9.0  NEUTROABS  12.4*  --   --   HGB 9.7* 9.2* 8.7*  HCT 29.5* 27.7* 26.8*  MCV 92.5 91.1 92.7  PLT 251 260 353   Basic Metabolic Panel: Recent Labs  Lab 06/04/18 1819 06/05/18 1156 06/06/18 0441  NA 131* 132* 130*  K 3.4* 3.1* 3.2*  CL 100 104 101  CO2 20* 19* 19*  GLUCOSE 147* 162* 127*  BUN 35* 30* 23  CREATININE 1.27* 1.04* 1.18*  CALCIUM 8.2* 8.2* 8.2*   GFR: Estimated Creatinine Clearance: 28.4 mL/min (A) (by C-G formula based on SCr of 1.18 mg/dL (H)). Liver Function Tests: Recent Labs  Lab 06/04/18 1819  AST 36  ALT 10  ALKPHOS 51  BILITOT 0.6  PROT 6.0*  ALBUMIN 2.6*   No results for input(s): LIPASE, AMYLASE in the last 168 hours. No results for input(s): AMMONIA in the last 168 hours. Coagulation Profile: Recent Labs  Lab 06/05/18 1400  INR 1.15   Cardiac Enzymes: No results for input(s): CKTOTAL, CKMB, CKMBINDEX, TROPONINI in the last 168 hours. BNP (last 3 results) No results for input(s): PROBNP in the last 8760 hours. HbA1C: Recent Labs    06/05/18 0306  HGBA1C 5.7*   CBG: Recent Labs  Lab 06/05/18 1209 06/05/18 1733 06/05/18 2220 06/06/18 0805 06/06/18 1235  GLUCAP 135* 152* 120* 153* 99   Lipid Profile: No results for input(s): CHOL, HDL, LDLCALC, TRIG, CHOLHDL, LDLDIRECT in the last 72 hours. Thyroid Function Tests: No results for input(s): TSH, T4TOTAL, FREET4, T3FREE, THYROIDAB in the last 72 hours. Anemia Panel: Recent Labs    06/06/18 0441  VITAMINB12 58*  FOLATE 30.4  FERRITIN 358*  TIBC 193*  IRON 22*  RETICCTPCT 1.1   Sepsis Labs: No results for input(s): PROCALCITON, LATICACIDVEN in the last 168 hours.  Recent Results (from the past 240 hour(s))  Aerobic/Anaerobic Culture (surgical/deep wound)     Status: None (Preliminary result)   Collection Time: 06/05/18  4:06 PM  Result Value Ref Range Status   Specimen Description ABSCESS ABDOMEN  Final   Special Requests Normal  Final   Gram Stain NO WBC SEEN NO ORGANISMS SEEN    Final   Culture   Final    NO GROWTH < 24 HOURS Performed at [facility], [address] [place], [place] [address]    Report Status PENDING  Incomplete         Radiology Studies: Dg Chest 2 View  Result Date: 06/04/2018 CLINICAL DATA:  Weakness and cough x1 week, recent colonoscopy. EXAM: CHEST - 2 VIEW COMPARISON:  01/21/2018 FINDINGS: Normal heart size. Moderate aortic atherosclerosis at the arch without aneurysm. Chronic interstitial prominence is noted of the lungs without pulmonary consolidation, effusion pneumothorax. Calcified densities project over the lung bases compatible with soft tissue calcifications within the breasts on prior exam. There appears be a large eggshell calcification in the left upper quadrant of the abdomen measuring approximately 2.6 x 1.8 cm on the  frontal view possibly representing a calcified splenic artery aneurysm, calcified splenic cyst or calcified diverticulum. IMPRESSION: 1. No active pulmonary disease. Aortic atherosclerosis. 2. Chronic interstitial prominence is noted of the lungs without pulmonary consolidation, effusion, pneumothorax. Electronically Signed   By: Ashley Royalty M.D.   On: 06/04/2018 19:43   Ct Abdomen Pelvis W Contrast  Result Date: 06/04/2018 CLINICAL DATA:  Abdominal pain, acute, generalized. Colonoscopy 1 week ago. Dark red blood in stool. Nausea, vomiting, and diarrhea. EXAM: CT ABDOMEN AND PELVIS WITH CONTRAST TECHNIQUE: Multidetector CT imaging of the abdomen and pelvis was performed using the standard protocol following bolus administration of intravenous contrast. CONTRAST:  114mL OMNIPAQUE IOHEXOL 300 MG/ML  SOLN COMPARISON:  CT of the abdomen and pelvis 9/12/5 FINDINGS: Lower chest: Posteromedial right lower lobe airspace disease is concerning for infection or aspiration. Minimal atelectasis is present at the right base. The heart size is normal. A small hiatal hernia is present. No significant pleural or pericardial  effusion is present. Hepatobiliary: No focal liver abnormality is seen. Status post cholecystectomy. No biliary dilatation. Pancreas: Unremarkable. No pancreatic ductal dilatation or surrounding inflammatory changes. Spleen: Normal in size without focal abnormality. Adrenals/Urinary Tract: Adrenal glands are normal bilaterally. Kidneys and ureters are within normal limits. There is no stone or mass lesion. No obstruction is present. The urinary bladder is within normal limits. Stomach/Bowel: The stomach is otherwise normal. Duodenum is within normal limits as well. Small bowel is unremarkable. Terminal ileum is within normal limits. Appendix is visualized and normal. The ascending and transverse colon are within normal limits. Descending colon is unremarkable. Diverticular changes are present distally. Diverticular changes extend into the sigmoid colon. There is focal inflammation the distal sigmoid colon, above the rectum. Adjacent peripherally enhancing fluid collection in the left anatomic pelvis measures 5.1 x 2.2 x 5.9 cm. Vascular/Lymphatic: No significant adenopathy is present. Atherosclerotic calcifications are present in the aorta without aneurysm. Reproductive: Status post hysterectomy. No adnexal masses. Other: The a small amount of free fluid is present in the anatomic pelvis separate from the peripherally enhancing collection. There is no free air. No ventral hernia is present. Musculoskeletal: Left total hip arthroplasty is present. Bony pelvis is otherwise unremarkable. Degenerative grade 1 anterolisthesis is present at L4-5 with uncovering of a broad-based disc protrusion leading to central and bilateral foraminal narrowing. Central and bilateral foraminal narrowing is also present at L5-S1. IMPRESSION: 1. Sigmoid diverticular disease with inflammatory changes and pericolonic fluid collection likely reflecting acute diverticulitis complicated with abscess measuring 5.1 x 2.2 x 5.9 cm. 2. Additional  free fluid in the anatomic pelvis is likely reactive. 3. No free air to suggest perforation. 4. Right lower lobe airspace disease concerning for pneumonia or aspiration. 5. Degenerative changes in the lower lumbar spine. These results were called by telephone at the time of interpretation on 06/04/2018 at 8:56 pm to Dr. Veryl Speak , who verbally acknowledged these results. Electronically Signed   By: San Morelle M.D.   On: 06/04/2018 20:56   Ct Image Guided Drainage By Percutaneous Catheter  Result Date: 06/05/2018 INDICATION: Left pelvic abscess concern for diverticulitis EXAM: CT guided left trans gluteal pelvic fluid collection drain MEDICATIONS: The patient is currently admitted to the hospital and receiving intravenous antibiotics. The antibiotics were administered within an appropriate time frame prior to the initiation of the procedure. ANESTHESIA/SEDATION: Fentanyl 50 mcg IV; Versed 1.0 mg IV Moderate Sedation Time:  13 minutes The patient was continuously monitored during the procedure by the interventional radiology  nurse under my direct supervision. COMPLICATIONS: None immediate. PROCEDURE: Informed written consent was obtained from the patient after a thorough discussion of the procedural risks, benefits and alternatives. All questions were addressed. Maximal Sterile Barrier Technique was utilized including caps, mask, sterile gowns, sterile gloves, sterile drape, hand hygiene and skin antiseptic. A timeout was performed prior to the initiation of the procedure. No previous imaging reviewed. Patient position prone. Noncontrast localization CT performed. The posterior left pelvic fluid collection was localized and marked for a left trans gluteal posterior approach. Under sterile conditions and local anesthesia, a 18 gauge 10 cm access needle was advanced from a left posterior trans gluteal approach into the fluid collection. Needle position confirmed with CT. Syringe aspiration yielded  dark thin fluid suspicious for chronic hematoma. Sample sent for culture. Guidewire inserted followed by tract dilatation to insert a 10 [place] drain. Drain catheter position confirmed with CT. Catheter secured with Prolene suture and connected to external suction bulb. Sterile dressing applied. No immediate complication. Patient tolerated the procedure well. IMPRESSION: Successful CT-guided left pelvic fluid collection drain placement as above. Electronically Signed   By: Jerilynn Mages.  Shick M.D.   On: 06/05/2018 16:09        Scheduled Meds: . atorvastatin  20 mg Oral Daily  . famotidine  40 mg Oral Daily  . feeding supplement  1 Container Oral TID BM  . heparin  5,000 Units Subcutaneous Q8H  . insulin aspart  0-9 Units Subcutaneous TID WC  . metoprolol succinate  100 mg Oral Daily  . multivitamin with minerals  1 tablet Oral Daily  . PARoxetine  40 mg Oral Daily  . sodium chloride flush  5 mL Intracatheter Q8H   Continuous Infusions: . sodium chloride Stopped (06/05/18 1344)  . sodium chloride    . piperacillin-tazobactam (ZOSYN)  IV 3.375 g (06/06/18 1348)     LOS: 2 days    Time spent 28 minutes.     Hosie Poisson, MD [facility] Pager [phone]  If 7PM-7AM, please contact night-coverage [email or web address] Password [email or web address] 06/06/2018, 4:11 PM

## 2018-06-07 LAB — BASIC METABOLIC PANEL
Anion gap: 10 (ref 5–15)
BUN: 15 mg/dL (ref 8–23)
CALCIUM: 8.3 mg/dL — AB (ref 8.9–10.3)
CO2: 19 mmol/L — AB (ref 22–32)
CREATININE: 1.03 mg/dL — AB (ref 0.44–1.00)
Chloride: 102 mmol/L (ref 98–111)
GFR calc Af Amer: 57 mL/min — ABNORMAL LOW (ref >60)
GFR calc non Af Amer: 49 mL/min — ABNORMAL LOW (ref >60)
GLUCOSE: 156 mg/dL — AB (ref 70–99)
Potassium: 3.1 mmol/L — ABNORMAL LOW (ref 3.5–5.1)
Sodium: 131 mmol/L — ABNORMAL LOW (ref 135–145)

## 2018-06-07 LAB — CBC
HCT: 27.8 % — ABNORMAL LOW (ref 36.0–46.0)
Hemoglobin: 9 g/dL — ABNORMAL LOW (ref 12.0–15.0)
MCH: 29.7 pg (ref 26.0–34.0)
MCHC: 32.4 g/dL (ref 30.0–36.0)
MCV: 91.7 fL (ref 80.0–100.0)
PLATELETS: 317 10*3/uL (ref 150–400)
RBC: 3.03 MIL/uL — AB (ref 3.87–5.11)
RDW: 12.7 % (ref 11.5–15.5)
WBC: 9.9 10*3/uL (ref 4.0–10.5)
nRBC: 0 % (ref 0.0–0.2)

## 2018-06-07 LAB — GLUCOSE, CAPILLARY
GLUCOSE-CAPILLARY: 156 mg/dL — AB (ref 70–99)
GLUCOSE-CAPILLARY: 220 mg/dL — AB (ref 70–99)
Glucose-Capillary: 256 mg/dL — ABNORMAL HIGH (ref 70–99)
Glucose-Capillary: 170 mg/dL — ABNORMAL HIGH (ref 70–99)

## 2018-06-07 MED ORDER — POTASSIUM CHLORIDE CRYS ER 20 MEQ PO TBCR
40.0000 meq | EXTENDED_RELEASE_TABLET | Freq: Two times a day (BID) | ORAL | Status: AC
  Administered 2018-06-07 (×2): 40 meq via ORAL
  Filled 2018-06-07 (×2): qty 2

## 2018-06-07 MED ORDER — MAGNESIUM SULFATE 50 % IJ SOLN
3.0000 g | Freq: Once | INTRAVENOUS | Status: AC
  Administered 2018-06-07: 3 g via INTRAVENOUS
  Filled 2018-06-07: qty 6

## 2018-06-07 MED ORDER — SODIUM PHOSPHATES 45 MMOLE/15ML IV SOLN
30.0000 mmol | Freq: Once | INTRAVENOUS | Status: AC
  Administered 2018-06-07: 30 mmol via INTRAVENOUS
  Filled 2018-06-07: qty 10

## 2018-06-07 NOTE — Plan of Care (Signed)
  Problem: Nutrition: Goal: Adequate nutrition will be maintained Outcome: Progressing   Problem: Elimination: Goal: Will not experience complications related to bowel motility Outcome: Progressing   Problem: Pain Managment: Goal: General experience of comfort will improve Outcome: Progressing   

## 2018-06-07 NOTE — Progress Notes (Signed)
PROGRESS NOTE    KINSEY KARCH  EHU:[id number] DOB: Apr 21, 1934 DOA: 06/04/2018 PCP: Abner Greenspan, MD    Brief Narrative:  Stephanie [name] is a 84 y.o. female with medical history significant of anxiety, type 2 diabetes, hypertension, hyperlipidemia, diverticulosis presenting to the hospital via EMS for evaluation of nausea and vomiting.  CT abdomen pelvis with evidence of acute diverticulitis implicated with an abscess measuring 5.1 x 2.2 x 5.9 cm, free fluid in the pelvis thought to be likely reactive.  No free air to suggest perforation.  CT showing right lower lobe airspace disease concerning for pneumonia or aspiration. She was started on IV zosyn and IV fluids and admitted to [facility] service.   Assessment & Plan:   Principal Problem:   Colonic diverticular abscess Active Problems:   Type 2 diabetes mellitus (HCC)   HTN (hypertension)   Depression   HLD (hyperlipidemia)   GERD (gastroesophageal reflux disease)   Diverticulitis   Anemia   Protein calorie malnutrition ([place])   Acute diverticulitis with abscess formation;  S/p IR guided perc drainage of the abscess.    Cultures are pending No nausea, or vomiting , abd pain is better but she continues to have loose bowel movements. Started her on clears and advance as tolerated. Currently on full liquid diet.   Gently hydrate with normal saline and continue with IV zosyn. Surgery consulted and appreciate recommendations.      Anemia of chronic disease: Baseline around 12.  Hemoglobin at 9, anemia panel reveals low iron level with vitamin B12 levels. Transfuse to keep hemoglobin greater than 7   Questionable pneumonia on CT abdomen and pelvis: Pt reports cough only and some sob.  Continue with IV zosyn. Day 4 of antibiotics.    Type 2 DM: CBG (last 3)  Recent Labs    06/06/18 2046 06/07/18 0757 06/07/18 1256  GLUCAP 223* 156* 256*   Well controlled.  A1c is 5.7.  Resume SSI, no change in  medications   Hyperlipidemia:  Resume lipitor    Hypertension:  Well controlled.    Depression:  Stable.    AKI Probably prerenal in etiology. started the patient on normal saline at 100 mL/h repeat renal para meters in the morning.   Hypokalemia, hypomagnesemia and hypophosphatemia:  Replaced, repeat in am.    DVT prophylaxis: scd's Code Status: full code.  Family Communication: family at bedside.  Disposition Plan: pending clinical improvement.    Consultants:   IR  SURGERY.   Procedures: Percutaneous drain placement.   Antimicrobials:IV zosyn since admission.   Subjective: Nausea, and vomiting resolved. abd pain improved. Diarrhea.persistent.  Objective: Vitals:   06/06/18 0529 06/06/18 1414 06/06/18 2048 06/07/18 0541  BP: (!) 155/59 (!) 145/66 (!) 145/97 (!) 177/69  Pulse: 66 64 70 70  Resp: 16  18 20   Temp: (!) 97.1 F (36.2 C) 98.4 F (36.9 C) 99.6 F (37.6 C) 98.5 F (36.9 C)  TempSrc: Oral Oral Oral Oral  SpO2: 98% 95% 94% 91%  Weight:      Height:        Intake/Output Summary (Last 24 hours) at 06/07/2018 1313 Last data filed at 06/07/2018 0900 Gross per 24 hour  Intake 848.36 ml  Output 230 ml  Net 618.36 ml   Filed Weights   06/04/18 1734  Weight: 49 kg    Examination:  General exam:lying in the bed. No distress.  Respiratory system: clear to auscultation, no wheezing or rhonchi.  Cardiovascular system: S1 &  S2 heard, RRR Gastrointestinal system: Abdomen is soft NT ND BS+ PERC DRAIN site looks good.  Central nervous system: Alert and oriented. Nonfocal.  Extremities: Symmetric 5 x 5 power.  No Pedal edema Skin: No rashes, lesions or ulcers Psychiatry: Mood & affect appropriate.     Data Reviewed: I have personally reviewed following labs and imaging studies  CBC: Recent Labs  Lab 06/04/18 1819 06/05/18 0306 06/06/18 0441 06/07/18 0244  WBC 14.2* 12.3* 9.0 9.9  NEUTROABS 12.4*  --   --   --   HGB 9.7* 9.2* 8.7*  9.0*  HCT 29.5* 27.7* 26.8* 27.8*  MCV 92.5 91.1 92.7 91.7  PLT 251 260 255 295   Basic Metabolic Panel: Recent Labs  Lab 06/04/18 1819 06/05/18 1156 06/06/18 0441 06/06/18 1802 06/07/18 0244  NA 131* 132* 130*  --  131*  K 3.4* 3.1* 3.2*  --  3.1*  CL 100 104 101  --  102  CO2 20* 19* 19*  --  19*  GLUCOSE 147* 162* 127*  --  156*  BUN 35* 30* 23  --  15  CREATININE 1.27* 1.04* 1.18*  --  1.03*  CALCIUM 8.2* 8.2* 8.2*  --  8.3*  MG  --   --   --  1.6*  --   PHOS  --   --   --  2.3*  --    GFR: Estimated Creatinine Clearance: 32.6 mL/min (A) (by C-G formula based on SCr of 1.03 mg/dL (H)). Liver Function Tests: Recent Labs  Lab 06/04/18 1819  AST 36  ALT 10  ALKPHOS 51  BILITOT 0.6  PROT 6.0*  ALBUMIN 2.6*   No results for input(s): LIPASE, AMYLASE in the last 168 hours. No results for input(s): AMMONIA in the last 168 hours. Coagulation Profile: Recent Labs  Lab 06/05/18 1400  INR 1.15   Cardiac Enzymes: No results for input(s): CKTOTAL, CKMB, CKMBINDEX, TROPONINI in the last 168 hours. BNP (last 3 results) No results for input(s): PROBNP in the last 8760 hours. HbA1C: Recent Labs    06/05/18 0306  HGBA1C 5.7*   CBG: Recent Labs  Lab 06/06/18 1235 06/06/18 1708 06/06/18 2046 06/07/18 0757 06/07/18 1256  GLUCAP 99 140* 223* 156* 256*   Lipid Profile: No results for input(s): CHOL, HDL, LDLCALC, TRIG, CHOLHDL, LDLDIRECT in the last 72 hours. Thyroid Function Tests: No results for input(s): TSH, T4TOTAL, FREET4, T3FREE, THYROIDAB in the last 72 hours. Anemia Panel: Recent Labs    06/06/18 0441  VITAMINB12 58*  FOLATE 30.4  FERRITIN 358*  TIBC 193*  IRON 22*  RETICCTPCT 1.1   Sepsis Labs: No results for input(s): PROCALCITON, LATICACIDVEN in the last 168 hours.  Recent Results (from the past 240 hour(s))  Aerobic/Anaerobic Culture (surgical/deep wound)     Status: None (Preliminary result)   Collection Time: 06/05/18  4:06 PM  Result  Value Ref Range Status   Specimen Description ABSCESS ABDOMEN  Final   Special Requests Normal  Final   Gram Stain NO WBC SEEN NO ORGANISMS SEEN   Final   Culture   Final    NO GROWTH 2 DAYS Performed at [facility], [address], [place], [place] [address]    Report Status PENDING  Incomplete         Radiology Studies: Ct Image Guided Drainage By Percutaneous Catheter  Result Date: 06/05/2018 INDICATION: Left pelvic abscess concern for diverticulitis EXAM: CT guided left trans gluteal pelvic fluid collection drain MEDICATIONS:  The patient is currently admitted to the hospital and receiving intravenous antibiotics. The antibiotics were administered within an appropriate time frame prior to the initiation of the procedure. ANESTHESIA/SEDATION: Fentanyl 50 mcg IV; Versed 1.0 mg IV Moderate Sedation Time:  13 minutes The patient was continuously monitored during the procedure by the interventional radiology nurse under my direct supervision. COMPLICATIONS: None immediate. PROCEDURE: Informed written consent was obtained from the patient after a thorough discussion of the procedural risks, benefits and alternatives. All questions were addressed. Maximal Sterile Barrier Technique was utilized including caps, mask, sterile gowns, sterile gloves, sterile drape, hand hygiene and skin antiseptic. A timeout was performed prior to the initiation of the procedure. No previous imaging reviewed. Patient position prone. Noncontrast localization CT performed. The posterior left pelvic fluid collection was localized and marked for a left trans gluteal posterior approach. Under sterile conditions and local anesthesia, a 18 gauge 10 cm access needle was advanced from a left posterior trans gluteal approach into the fluid collection. Needle position confirmed with CT. Syringe aspiration yielded dark thin fluid suspicious for chronic hematoma. Sample sent for culture. Guidewire inserted followed by tract  dilatation to insert a 10 [place] drain. Drain catheter position confirmed with CT. Catheter secured with Prolene suture and connected to external suction bulb. Sterile dressing applied. No immediate complication. Patient tolerated the procedure well. IMPRESSION: Successful CT-guided left pelvic fluid collection drain placement as above. Electronically Signed   By: Jerilynn Mages.  Shick M.D.   On: 06/05/2018 16:09        Scheduled Meds: . atorvastatin  20 mg Oral Daily  . cyanocobalamin  1,000 mcg Intramuscular Daily  . famotidine  40 mg Oral Daily  . feeding supplement  1 Container Oral TID BM  . ferrous sulfate  325 mg Oral Q breakfast  . heparin  5,000 Units Subcutaneous Q8H  . insulin aspart  0-9 Units Subcutaneous TID WC  . metoprolol succinate  100 mg Oral Daily  . multivitamin with minerals  1 tablet Oral Daily  . PARoxetine  40 mg Oral Daily  . potassium chloride  40 mEq Oral BID  . sodium chloride flush  5 mL Intracatheter Q8H   Continuous Infusions: . sodium chloride Stopped (06/05/18 1344)  . sodium chloride 75 mL/hr at 06/07/18 0903  . magnesium sulfate 1 - 4 g bolus IVPB    . piperacillin-tazobactam (ZOSYN)  IV 12.5 mL/hr at 06/07/18 0532  . sodium phosphate  Dextrose 5% IVPB       LOS: 3 days    Time spent 28 minutes.     Hosie Poisson, MD [facility] Pager [phone]  If 7PM-7AM, please contact night-coverage [email or web address] Password [email or web address] 06/07/2018, 1:13 PM

## 2018-06-07 NOTE — Progress Notes (Signed)
Assessment & Plan: HD#4 - acute diverticulitis with pericolonic abscess             IR drain in place, IR following             Clear liquid diet tolerated - advance to full liquid diet today             Monitor diarrhea - improving             IV Zosyn  Will follow with you.        [name], MD       [facility]       Office: [phone]   Chief Complaint: Acute diverticulitis with abscess  Subjective: Patient in bed, husband at bedside.  Tolerated clear liquid diet.  Denies nausea or emesis.  Diarrhea persists but better.  Objective: Vital signs in last 24 hours: Temp:  [98.4 F (36.9 C)-99.6 F (37.6 C)] 98.5 F (36.9 C) (11/24 0541) Pulse Rate:  [64-70] 70 (11/24 0541) Resp:  [18-20] 20 (11/24 0541) BP: (145-177)/(66-97) 177/69 (11/24 0541) SpO2:  [91 %-95 %] 91 % (11/24 0541) Last BM Date: 06/06/18  Intake/Output from previous day: 11/23 0701 - 11/24 0700 In: 288.4 [I.V.:76.4; IV Piggyback:196.9] Out: 230 [Urine:200; Drains:30] Intake/Output this shift: No intake/output data recorded.  Physical Exam: HEENT - sclerae clear, mucous membranes moist Neck - soft Chest - clear bilaterally Cor - RRR Abdomen - soft without distension; minimal tenderness LLQ; no mass; JP with dark serosanguinous output, small Ext - no edema, non-tender Neuro - alert & oriented, no focal deficits  Lab Results:  Recent Labs    06/06/18 0441 06/07/18 0244  WBC 9.0 9.9  HGB 8.7* 9.0*  HCT 26.8* 27.8*  PLT 255 317   BMET Recent Labs    06/06/18 0441 06/07/18 0244  NA 130* 131*  K 3.2* 3.1*  CL 101 102  CO2 19* 19*  GLUCOSE 127* 156*  BUN 23 15  CREATININE 1.18* 1.03*  CALCIUM 8.2* 8.3*   PT/INR Recent Labs    06/05/18 1400  LABPROT 14.6  INR 1.15   Comprehensive Metabolic Panel:    Component Value Date/Time   NA 131 (L) 06/07/2018 0244   NA 130 (L) 06/06/2018 0441   K 3.1 (L) 06/07/2018 0244   K 3.2 (L) 06/06/2018 0441   CL  102 06/07/2018 0244   CL 101 06/06/2018 0441   CO2 19 (L) 06/07/2018 0244   CO2 19 (L) 06/06/2018 0441   BUN 15 06/07/2018 0244   BUN 23 06/06/2018 0441   CREATININE 1.03 (H) 06/07/2018 0244   CREATININE 1.18 (H) 06/06/2018 0441   CREATININE 0.88 11/27/2010 0916   GLUCOSE 156 (H) 06/07/2018 0244   GLUCOSE 127 (H) 06/06/2018 0441   CALCIUM 8.3 (L) 06/07/2018 0244   CALCIUM 8.2 (L) 06/06/2018 0441   AST 36 06/04/2018 1819   AST 14 02/09/2018 0856   ALT 10 06/04/2018 1819   ALT 7 02/09/2018 0856   ALKPHOS 51 06/04/2018 1819   ALKPHOS 89 02/09/2018 0856   BILITOT 0.6 06/04/2018 1819   BILITOT 0.5 02/09/2018 0856   PROT 6.0 (L) 06/04/2018 1819   PROT 7.3 02/09/2018 0856   ALBUMIN 2.6 (L) 06/04/2018 1819   ALBUMIN 3.9 02/09/2018 0856    Studies/Results: Ct Image Guided Drainage By Percutaneous Catheter  Result Date: 06/05/2018 INDICATION: Left pelvic abscess concern for diverticulitis EXAM: CT guided left trans gluteal pelvic fluid collection drain MEDICATIONS: The  patient is currently admitted to the hospital and receiving intravenous antibiotics. The antibiotics were administered within an appropriate time frame prior to the initiation of the procedure. ANESTHESIA/SEDATION: Fentanyl 50 mcg IV; Versed 1.0 mg IV Moderate Sedation Time:  13 minutes The patient was continuously monitored during the procedure by the interventional radiology nurse under my direct supervision. COMPLICATIONS: None immediate. PROCEDURE: Informed written consent was obtained from the patient after a thorough discussion of the procedural risks, benefits and alternatives. All questions were addressed. Maximal Sterile Barrier Technique was utilized including caps, mask, sterile gowns, sterile gloves, sterile drape, hand hygiene and skin antiseptic. A timeout was performed prior to the initiation of the procedure. No previous imaging reviewed. Patient position prone. Noncontrast localization CT performed. The posterior  left pelvic fluid collection was localized and marked for a left trans gluteal posterior approach. Under sterile conditions and local anesthesia, a 18 gauge 10 cm access needle was advanced from a left posterior trans gluteal approach into the fluid collection. Needle position confirmed with CT. Syringe aspiration yielded dark thin fluid suspicious for chronic hematoma. Sample sent for culture. Guidewire inserted followed by tract dilatation to insert a 10 [place] drain. Drain catheter position confirmed with CT. Catheter secured with Prolene suture and connected to external suction bulb. Sterile dressing applied. No immediate complication. Patient tolerated the procedure well. IMPRESSION: Successful CT-guided left pelvic fluid collection drain placement as above. Electronically Signed   By: Jerilynn Mages.  Shick [name].D.   On: 06/05/2018 16:09      Stephanie [name] 06/07/2018  Patient ID: Stephanie [name], female   DOB: 06-14-1934, 84 y.o.   MRN: [id number]

## 2018-06-08 LAB — GLUCOSE, CAPILLARY
GLUCOSE-CAPILLARY: 234 mg/dL — AB (ref 70–99)
GLUCOSE-CAPILLARY: 157 mg/dL — AB (ref 70–99)
GLUCOSE-CAPILLARY: 231 mg/dL — AB (ref 70–99)
Glucose-Capillary: 164 mg/dL — ABNORMAL HIGH (ref 70–99)

## 2018-06-08 LAB — BASIC METABOLIC PANEL
Anion gap: 7 (ref 5–15)
BUN: 7 mg/dL — AB (ref 8–23)
CHLORIDE: 106 mmol/L (ref 98–111)
CO2: 19 mmol/L — AB (ref 22–32)
CREATININE: 0.98 mg/dL (ref 0.44–1.00)
Calcium: 7.9 mg/dL — ABNORMAL LOW (ref 8.9–10.3)
GFR calc Af Amer: >60 mL/min (ref >60)
GFR calc non Af Amer: 52 mL/min — ABNORMAL LOW (ref >60)
GLUCOSE: 190 mg/dL — AB (ref 70–99)
Potassium: 3.3 mmol/L — ABNORMAL LOW (ref 3.5–5.1)
SODIUM: 132 mmol/L — AB (ref 135–145)

## 2018-06-08 LAB — CBC
HCT: 25.8 % — ABNORMAL LOW (ref 36.0–46.0)
HEMOGLOBIN: 8.8 g/dL — AB (ref 12.0–15.0)
MCH: 30.6 pg (ref 26.0–34.0)
MCHC: 34.1 g/dL (ref 30.0–36.0)
MCV: 89.6 fL (ref 80.0–100.0)
Platelets: 315 10*3/uL (ref 150–400)
RBC: 2.88 MIL/uL — ABNORMAL LOW (ref 3.87–5.11)
RDW: 12.5 % (ref 11.5–15.5)
WBC: 10.5 10*3/uL (ref 4.0–10.5)
nRBC: 0 % (ref 0.0–0.2)

## 2018-06-08 LAB — MAGNESIUM: MAGNESIUM: 1.9 mg/dL (ref 1.7–2.4)

## 2018-06-08 LAB — PHOSPHORUS: Phosphorus: 3 mg/dL (ref 2.5–4.6)

## 2018-06-08 MED ORDER — AMLODIPINE BESYLATE 5 MG PO TABS
5.0000 mg | ORAL_TABLET | Freq: Every day | ORAL | Status: DC
  Administered 2018-06-08 – 2018-06-09 (×2): 5 mg via ORAL
  Filled 2018-06-08 (×2): qty 1

## 2018-06-08 MED ORDER — GLUCERNA SHAKE PO LIQD: 237.0000 mL | Freq: Three times a day (TID) | ORAL | Status: DC

## 2018-06-08 MED ORDER — POTASSIUM CHLORIDE 20 MEQ/15ML (10%) PO SOLN
40.0000 meq | Freq: Two times a day (BID) | ORAL | Status: AC
  Administered 2018-06-08 (×2): 40 meq via ORAL
  Filled 2018-06-08 (×2): qty 30

## 2018-06-08 MED ORDER — AMOXICILLIN-POT CLAVULANATE 500-125 MG PO TABS
1.0000 | ORAL_TABLET | Freq: Three times a day (TID) | ORAL | Status: DC
  Administered 2018-06-08 – 2018-06-10 (×6): 500 mg via ORAL
  Filled 2018-06-08 (×7): qty 1

## 2018-06-08 NOTE — Progress Notes (Signed)
Nutrition Follow-up  DOCUMENTATION CODES:   Not applicable  INTERVENTION:   -D/c Boost Breeze po TID, each supplement provides 250 kcal and 9 grams of protein -Glucerna Shake po TID, each supplement provides 220 kcal and 10 grams of protein -Continue MVI with minerals daily  NUTRITION DIAGNOSIS:   Inadequate oral intake related to altered GI function as evidenced by meal completion < 50%.  Progressing  GOAL:   Patient will meet greater than or equal to 90% of their needs  Progressing  MONITOR:   PO intake, Supplement acceptance, Diet advancement, Labs, Weight trends, Skin, I & O's  REASON FOR ASSESSMENT:   Malnutrition Screening Tool    ASSESSMENT:   Stephanie [name] is a 84 y.o. female with medical history significant of anxiety, type 2 diabetes, hypertension, hyperlipidemia, diverticulosis presenting to the hospital via EMS for evaluation of nausea and vomiting.  Patient states she had a colonoscopy done last week and 4 polyps were removed.  She was having blood in her stool prior to the colonoscopy and it even continued afterwards.  Reports noticing a small amount of blood coating her stool intermittently and dark stools intermittently since her colonoscopy.  Also reports having intermittent nausea and vomiting.  Her oral intake is decreased.  Reports having loose stools but no diarrhea.  Denies having abdominal pain.  States she has been coughing secondary to postnasal drip from her sinus congestion.  She denies having any shortness of breath or fevers.  Reports feeling cold.  11/22- s/p diverticular abscess drain placement 11/24- advanced to clear liquid diet 11/25- advanced to soft diet  Pt resting quietly at time of visit. RD did not disturb.   Pt is tolerating full liquids, however, not eating much. Pt is refusing Boost Breeze supplements per MAR. Will trial Glucerna supplements due to elevated CBGS.   Pet general surgery notes, considering checking c-diff.   Labs  reviewed: Na: 132, K: 3.3, CBGS: 164-256 (inpatient orders for glycemic control are 0-9 units insulin aspart TID with meals).   Diet Order:   Diet Order            DIET SOFT Room service appropriate? Yes; Fluid consistency: Thin  Diet effective now              EDUCATION NEEDS:   Education needs have been addressed  Skin:  Skin Assessment: Reviewed RN Assessment  Last BM:  06/08/18  Height:   Ht Readings from Last 1 Encounters:  06/04/18 5\' 2"  (1.575 m)    Weight:   Wt Readings from Last 1 Encounters:  06/04/18 49 kg    Ideal Body Weight:  50 kg  BMI:  Body mass index is 19.75 kg/m.  Estimated Nutritional Needs:   Kcal:  1300-1500  Protein:  55-70 grams  Fluid:  1.3-1.5 L    Kalena Mander A. Jimmye Norman, RD, LDN, CDE Pager: [phone] After hours Pager: [phone]

## 2018-06-08 NOTE — Progress Notes (Signed)
PROGRESS NOTE    Stephanie [name]  CVE:[id number] DOB: 1934/03/15 DOA: 06/04/2018 PCP: Abner Greenspan, MD    Brief Narrative:  Stephanie [name] is a 84 y.o. female with medical history significant of anxiety, type 2 diabetes, hypertension, hyperlipidemia, diverticulosis presenting to the hospital via EMS for evaluation of nausea and vomiting. CT abdomen pelvis with evidence of acute diverticulitis implicated with an abscess measuring 5.1 x 2.2 x 5.9 cm, free fluid in the pelvis thought to be likely reactive.  No free air to suggest perforation.  CT showing right lower lobe airspace disease concerning for pneumonia or aspiration. She was started on IV zosyn and IV fluids and admitted to [facility] service.   Assessment & Plan:   Principal Problem:   Colonic diverticular abscess Active Problems:   Type 2 diabetes mellitus (HCC)   HTN (hypertension)   Depression   HLD (hyperlipidemia)   GERD (gastroesophageal reflux disease)   Diverticulitis   Anemia   Protein calorie malnutrition ([place])   Acute diverticulitis with abscess formation: s/p IR guided perc drainage of the abscess on 06/05/2018.  Cultures negative.  Clinically improving.  Walked in the hallway earlier this morning without problem.  Tolerating full liquid diet.  She remains afebrile and without leukocytosis. -Continue advancing diet -Discontinue IV fluid -Zosyn 11/21--11/25 -Transition to oral Augmentin for total of 14 days. -Replete electrolytes. -Okay to discharge from surgery standpoint.  Hypokalemia -Replace and recheck -Magnesium within normal limits.  Anemia of chronic disease: 9.7 on admission.  8.8 today. Anemia panel reveals low iron level with vitamin B12 levels. -Continue multivitamin and iron supplements. -Recheck CBC in the morning  Questionable pneumonia on CT abdomen and pelvis: -Antibiotic as above -Expectorants  Type 2 DM: A1c 5.7 CBG (last 3)  Recent Labs    06/07/18 2247 06/08/18 0759 06/08/18 1209    GLUCAP 220* 164* 234*  -Change sliding scale to moderate.  Severe protein caloric malnutrition -Continue supplement per nutrition  Hyperlipidemia:  -Continue home statin  Hypertension: SBP elevated to 170s this morning. -Restart home amlodipine. -Continue home metoprolol  Depression: Stable.  -Continue medications  AKI on CKD 3: Improved.   DVT prophylaxis: Subcu heparin Code Status: full code.  Family Communication: Husband at bedside. Disposition Plan: We will continue advancing diet.  Discontinue IV fluids replace electrolytes. Anticipate discharge in the next 24 hours if she remains stable off IV fluid and tolerates p.o. antibiotic.  Consultants:   IR  SURGERY.   Procedures: Percutaneous drain placement.   Antimicrobials: IV zosyn 11/21 to 11/25 P.o. Augmentin 11/25-12/13  Subjective: No complaints this morning.  States that she walked in the hallway without problem.  Tolerating diet advancement.  She is currently on full liquid.  Denies nausea, vomiting and abdominal pain  Objective: Vitals:   06/07/18 0541 06/07/18 1438 06/07/18 2300 06/08/18 0442  BP: (!) 177/69 (!) 146/63 (!) 142/77 (!) 171/80  Pulse: 70 69 73 70  Resp: 20 16 16 16   Temp: 98.5 F (36.9 C) 98.1 F (36.7 C) 99.1 F (37.3 C) (!) 97.5 F (36.4 C)  TempSrc: Oral Oral Oral Oral  SpO2: 91% 92% 97% 91%  Weight:      Height:        Intake/Output Summary (Last 24 hours) at 06/08/2018 1308 Last data filed at 06/08/2018 1030 Gross per 24 hour  Intake 1809 ml  Output 34 ml  Net 1775 ml   Filed Weights   06/04/18 1734  Weight: 49 kg  Examination:  General exam:lying in the bed. No distress.  Respiratory system: clear to auscultation, no wheezing or rhonchi.  Cardiovascular system: S1 & S2 heard, RRR Gastrointestinal system: Abdomen is soft NT ND BS+ PERC DRAIN site looks good.  Central nervous system: Alert and oriented. Nonfocal.  Extremities: Symmetric 5 x 5 power.  No Pedal  edema Skin: No rashes, lesions or ulcers Psychiatry: Mood & affect appropriate.     Data Reviewed: I have personally reviewed following labs and imaging studies  CBC: Recent Labs  Lab 06/04/18 1819 06/05/18 0306 06/06/18 0441 06/07/18 0244 06/08/18 0707  WBC 14.2* 12.3* 9.0 9.9 10.5  NEUTROABS 12.4*  --   --   --   --   HGB 9.7* 9.2* 8.7* 9.0* 8.8*  HCT 29.5* 27.7* 26.8* 27.8* 25.8*  MCV 92.5 91.1 92.7 91.7 89.6  PLT 251 260 255 317 601   Basic Metabolic Panel: Recent Labs  Lab 06/04/18 1819 06/05/18 1156 06/06/18 0441 06/06/18 1802 06/07/18 0244 06/08/18 0707  NA 131* 132* 130*  --  131* 132*  K 3.4* 3.1* 3.2*  --  3.1* 3.3*  CL 100 104 101  --  102 106  CO2 20* 19* 19*  --  19* 19*  GLUCOSE 147* 162* 127*  --  156* 190*  BUN 35* 30* 23  --  15 7*  CREATININE 1.27* 1.04* 1.18*  --  1.03* 0.98  CALCIUM 8.2* 8.2* 8.2*  --  8.3* 7.9*  MG  --   --   --  1.6*  --  1.9  PHOS  --   --   --  2.3*  --  3.0   GFR: Estimated Creatinine Clearance: 34.2 mL/min (by C-G formula based on SCr of 0.98 mg/dL). Liver Function Tests: Recent Labs  Lab 06/04/18 1819  AST 36  ALT 10  ALKPHOS 51  BILITOT 0.6  PROT 6.0*  ALBUMIN 2.6*   No results for input(s): LIPASE, AMYLASE in the last 168 hours. No results for input(s): AMMONIA in the last 168 hours. Coagulation Profile: Recent Labs  Lab 06/05/18 1400  INR 1.15   Cardiac Enzymes: No results for input(s): CKTOTAL, CKMB, CKMBINDEX, TROPONINI in the last 168 hours. BNP (last 3 results) No results for input(s): PROBNP in the last 8760 hours. HbA1C: No results for input(s): HGBA1C in the last 72 hours. CBG: Recent Labs  Lab 06/07/18 1256 06/07/18 1759 06/07/18 2247 06/08/18 0759 06/08/18 1209  GLUCAP 256* 170* 220* 164* 234*   Lipid Profile: No results for input(s): CHOL, HDL, LDLCALC, TRIG, CHOLHDL, LDLDIRECT in the last 72 hours. Thyroid Function Tests: No results for input(s): TSH, T4TOTAL, FREET4, T3FREE,  THYROIDAB in the last 72 hours. Anemia Panel: Recent Labs    06/06/18 0441  VITAMINB12 58*  FOLATE 30.4  FERRITIN 358*  TIBC 193*  IRON 22*  RETICCTPCT 1.1   Sepsis Labs: No results for input(s): PROCALCITON, LATICACIDVEN in the last 168 hours.  Recent Results (from the past 240 hour(s))  Aerobic/Anaerobic Culture (surgical/deep wound)     Status: None (Preliminary result)   Collection Time: 06/05/18  4:06 PM  Result Value Ref Range Status   Specimen Description ABSCESS ABDOMEN  Final   Special Requests Normal  Final   Gram Stain NO WBC SEEN NO ORGANISMS SEEN   Final   Culture   Final    NO GROWTH 3 DAYS NO ANAEROBES ISOLATED; CULTURE IN PROGRESS FOR 5 DAYS Performed at [facility], [address], [place], [place] [address]    Report Status PENDING  Incomplete         Radiology Studies: No results found.      Scheduled Meds: . atorvastatin  20 mg Oral Daily  . cyanocobalamin  1,000 mcg Intramuscular Daily  . famotidine  40 mg Oral Daily  . feeding supplement (GLUCERNA SHAKE)  237 mL Oral TID BM  . ferrous sulfate  325 mg Oral Q breakfast  . heparin  5,000 Units Subcutaneous Q8H  . insulin aspart  0-9 Units Subcutaneous TID WC  . metoprolol succinate  100 mg Oral Daily  . multivitamin with minerals  1 tablet Oral Daily  . PARoxetine  40 mg Oral Daily  . potassium chloride  40 mEq Oral BID  . sodium chloride flush  5 mL Intracatheter Q8H   Continuous Infusions: . sodium chloride Stopped (06/05/18 1344)  . sodium chloride 75 mL/hr at 06/08/18 1021  . piperacillin-tazobactam (ZOSYN)  IV 3.375 g (06/08/18 0618)     LOS: 4 days   Eleaner Dibartolo T. [facility] Pager [phone]  If 7PM-7AM, please contact night-coverage [email or web address] Password [email or web address] 06/08/2018, 1:08 PM

## 2018-06-08 NOTE — Progress Notes (Signed)
Referring Physician(s): Dr. Dalbert Batman  Supervising Physician: Arne Cleveland  Patient Status:  [facility] - In-pt  Chief Complaint: Left TG drain placed 11/22 by Dr. Annamaria Boots  Subjective:  Patient sitting on edge of bed eating lunch, able to move easily without pain. Denies any complaints today, enjoyed the tomato soup.   Allergies: Ace inhibitors; Amlodipine besy-benazepril hcl; Clopidogrel bisulfate; and Erythromycin  Medications: Prior to Admission medications   Medication Sig Start Date End Date Taking? Authorizing Provider  amLODipine (NORVASC) 5 MG tablet Take 1 tablet (5 mg total) by mouth daily. 02/16/18  Yes Tower, Wynelle Fanny, MD  atorvastatin (LIPITOR) 20 MG tablet TAKE 1 TABLET BY MOUTH ONCE DAILY Patient taking differently: Take 20 mg by mouth daily.  04/27/18  Yes Tower, Wynelle Fanny, MD  chlorthalidone (HYGROTON) 25 MG tablet Take 1 tablet (25 mg total) by mouth daily. 08/27/17  Yes Tower, Wynelle Fanny, MD  famotidine (PEPCID) 40 MG tablet Take 1 tablet (40 mg total) by mouth daily. 04/28/18  Yes Thornton Park, MD  glipiZIDE (GLIPIZIDE XL) 10 MG 24 hr tablet TAKE 1 TABLET BY MOUTH ONCE DAILY WITH BREAKFAST Patient taking differently: Take 10 mg by mouth daily with breakfast.  02/16/18  Yes Tower, Wynelle Fanny, MD  metFORMIN (GLUCOPHAGE) 1000 MG tablet Take 1 tablet (1,000 mg total) by mouth 2 (two) times daily with a meal. 02/16/18  Yes Tower, Marne A, MD  metoprolol succinate (TOPROL-XL) 100 MG 24 hr tablet TAKE 1 TABLET BY MOUTH ONCE DAILY TAKE  WITH  OR  IMMEDIATELY  FOLLOWING  A  MEAL Patient taking differently: Take 100 mg by mouth daily.  02/16/18  Yes Tower, Wynelle Fanny, MD  PARoxetine (PAXIL) 40 MG tablet Take 1 tablet (40 mg total) by mouth daily. 08/27/17  Yes Tower, Wynelle Fanny, MD  acetaminophen (TYLENOL) 325 MG tablet Take 2 tablets (650 mg total) by mouth every 6 (six) hours as needed. Patient not taking: Reported on 06/04/2018 01/21/18   Marchia Bond, MD  dipyridamole-aspirin [facility]) 200-25  MG 12hr capsule Take 1 capsule by mouth 2 (two) times daily. RESTART THIS IN 30days when you come off ELIQUIS Patient not taking: Reported on 06/04/2018 02/24/18   Domenic Polite, MD     Vital Signs: BP (!) 171/80 (BP Location: Left Arm)   Pulse 70   Temp (!) 97.5 F (36.4 C) (Oral)   Resp 16   Ht 5\' 2"  (1.575 m)   Wt 108 lb (49 kg)   SpO2 91%   BMI 19.75 kg/m   Physical Exam  Constitutional: No distress.  HENT:  Head: Normocephalic.  Cardiovascular: Normal rate, regular rhythm and normal heart sounds.  Pulmonary/Chest: Effort normal and breath sounds normal.  Abdominal: Soft. She exhibits no distension. There is no tenderness.  Left TG drain to JP with scant brown OP. Insertion site clean, dry, intact  Neurological: She is alert.  Skin: Skin is warm and dry. She is not diaphoretic.  Nursing note and vitals reviewed.   Imaging: Dg Chest 2 View  Result Date: 06/04/2018 CLINICAL DATA:  Weakness and cough x1 week, recent colonoscopy. EXAM: CHEST - 2 VIEW COMPARISON:  01/21/2018 FINDINGS: Normal heart size. Moderate aortic atherosclerosis at the arch without aneurysm. Chronic interstitial prominence is noted of the lungs without pulmonary consolidation, effusion pneumothorax. Calcified densities project over the lung bases compatible with soft tissue calcifications within the breasts on prior exam. There appears be a large eggshell calcification in the left upper quadrant of the abdomen  measuring approximately 2.6 x 1.8 cm on the frontal view possibly representing a calcified splenic artery aneurysm, calcified splenic cyst or calcified diverticulum. IMPRESSION: 1. No active pulmonary disease. Aortic atherosclerosis. 2. Chronic interstitial prominence is noted of the lungs without pulmonary consolidation, effusion, pneumothorax. Electronically Signed   By: Ashley Royalty M.D.   On: 06/04/2018 19:43   Ct Abdomen Pelvis W Contrast  Result Date: 06/04/2018 CLINICAL DATA:  Abdominal pain,  acute, generalized. Colonoscopy 1 week ago. Dark red blood in stool. Nausea, vomiting, and diarrhea. EXAM: CT ABDOMEN AND PELVIS WITH CONTRAST TECHNIQUE: Multidetector CT imaging of the abdomen and pelvis was performed using the standard protocol following bolus administration of intravenous contrast. CONTRAST:  150mL OMNIPAQUE IOHEXOL 300 MG/ML  SOLN COMPARISON:  CT of the abdomen and pelvis 9/12/5 FINDINGS: Lower chest: Posteromedial right lower lobe airspace disease is concerning for infection or aspiration. Minimal atelectasis is present at the right base. The heart size is normal. A small hiatal hernia is present. No significant pleural or pericardial effusion is present. Hepatobiliary: No focal liver abnormality is seen. Status post cholecystectomy. No biliary dilatation. Pancreas: Unremarkable. No pancreatic ductal dilatation or surrounding inflammatory changes. Spleen: Normal in size without focal abnormality. Adrenals/Urinary Tract: Adrenal glands are normal bilaterally. Kidneys and ureters are within normal limits. There is no stone or mass lesion. No obstruction is present. The urinary bladder is within normal limits. Stomach/Bowel: The stomach is otherwise normal. Duodenum is within normal limits as well. Small bowel is unremarkable. Terminal ileum is within normal limits. Appendix is visualized and normal. The ascending and transverse colon are within normal limits. Descending colon is unremarkable. Diverticular changes are present distally. Diverticular changes extend into the sigmoid colon. There is focal inflammation the distal sigmoid colon, above the rectum. Adjacent peripherally enhancing fluid collection in the left anatomic pelvis measures 5.1 x 2.2 x 5.9 cm. Vascular/Lymphatic: No significant adenopathy is present. Atherosclerotic calcifications are present in the aorta without aneurysm. Reproductive: Status post hysterectomy. No adnexal masses. Other: The a small amount of free fluid is  present in the anatomic pelvis separate from the peripherally enhancing collection. There is no free air. No ventral hernia is present. Musculoskeletal: Left total hip arthroplasty is present. Bony pelvis is otherwise unremarkable. Degenerative grade 1 anterolisthesis is present at L4-5 with uncovering of a broad-based disc protrusion leading to central and bilateral foraminal narrowing. Central and bilateral foraminal narrowing is also present at L5-S1. IMPRESSION: 1. Sigmoid diverticular disease with inflammatory changes and pericolonic fluid collection likely reflecting acute diverticulitis complicated with abscess measuring 5.1 x 2.2 x 5.9 cm. 2. Additional free fluid in the anatomic pelvis is likely reactive. 3. No free air to suggest perforation. 4. Right lower lobe airspace disease concerning for pneumonia or aspiration. 5. Degenerative changes in the lower lumbar spine. These results were called by telephone at the time of interpretation on 06/04/2018 at 8:56 pm to Dr. Veryl Speak , who verbally acknowledged these results. Electronically Signed   By: San Morelle M.D.   On: 06/04/2018 20:56   Ct Image Guided Drainage By Percutaneous Catheter  Result Date: 06/05/2018 INDICATION: Left pelvic abscess concern for diverticulitis EXAM: CT guided left trans gluteal pelvic fluid collection drain MEDICATIONS: The patient is currently admitted to the hospital and receiving intravenous antibiotics. The antibiotics were administered within an appropriate time frame prior to the initiation of the procedure. ANESTHESIA/SEDATION: Fentanyl 50 mcg IV; Versed 1.0 mg IV Moderate Sedation Time:  13 minutes The patient was continuously  monitored during the procedure by the interventional radiology nurse under my direct supervision. COMPLICATIONS: None immediate. PROCEDURE: Informed written consent was obtained from the patient after a thorough discussion of the procedural risks, benefits and alternatives. All  questions were addressed. Maximal Sterile Barrier Technique was utilized including caps, mask, sterile gowns, sterile gloves, sterile drape, hand hygiene and skin antiseptic. A timeout was performed prior to the initiation of the procedure. No previous imaging reviewed. Patient position prone. Noncontrast localization CT performed. The posterior left pelvic fluid collection was localized and marked for a left trans gluteal posterior approach. Under sterile conditions and local anesthesia, a 18 gauge 10 cm access needle was advanced from a left posterior trans gluteal approach into the fluid collection. Needle position confirmed with CT. Syringe aspiration yielded dark thin fluid suspicious for chronic hematoma. Sample sent for culture. Guidewire inserted followed by tract dilatation to insert a 10 [place] drain. Drain catheter position confirmed with CT. Catheter secured with Prolene suture and connected to external suction bulb. Sterile dressing applied. No immediate complication. Patient tolerated the procedure well. IMPRESSION: Successful CT-guided left pelvic fluid collection drain placement as above. Electronically Signed   By: Jerilynn Mages.  Shick M.D.   On: 06/05/2018 16:09    Labs:  CBC: Recent Labs    06/05/18 0306 06/06/18 0441 06/07/18 0244 06/08/18 0707  WBC 12.3* 9.0 9.9 10.5  HGB 9.2* 8.7* 9.0* 8.8*  HCT 27.7* 26.8* 27.8* 25.8*  PLT 260 255 317 315    COAGS: Recent Labs    01/21/18 1323 06/05/18 1400  INR 1.09 1.15    BMP: Recent Labs    06/05/18 1156 06/06/18 0441 06/07/18 0244 06/08/18 0707  NA 132* 130* 131* 132*  K 3.1* 3.2* 3.1* 3.3*  CL 104 101 102 106  CO2 19* 19* 19* 19*  GLUCOSE 162* 127* 156* 190*  BUN 30* 23 15 7*  CALCIUM 8.2* 8.2* 8.3* 7.9*  CREATININE 1.04* 1.18* 1.03* 0.98  GFRNONAA 49* 42* 49* 52*  GFRAA 56* 48* 57* >60    LIVER FUNCTION TESTS: Recent Labs    08/27/17 1251 02/09/18 0856 06/04/18 1819  BILITOT 0.4 0.5 0.6  AST 14 14 36  ALT 6 7 10    ALKPHOS 46 89 51  PROT 7.2 7.3 6.0*  ALBUMIN 4.3 3.9 2.6*    Assessment and Plan:  Patient with diverticular abscess s/p left TG drain placed 11/22 by Dr. Annamaria Boots - advancing diet, WBC normalized. OP 30 cc in last 24 hours, previously reported as bloody - appears to be dark brown liquid today.   Continue current management - will follow up with IR clinic in 10-14 for repeat imaging/possible drain injection.  Discharge per primary team. Please call IR with questions or concerns.   Electronically Signed: Joaquim Nam, PA-C 06/08/2018, 1:34 PM   I spent a total of 15 Minutes at the the patient's bedside AND on the patient's hospital floor or unit, greater than 50% of which was counseling/coordinating care for diverticular abscess drain.

## 2018-06-08 NOTE — Progress Notes (Signed)
[facility]/Trauma Progress Note      Assessment/Plan Principal Problem:   Colonic diverticular abscess Active Problems:   Type 2 diabetes mellitus (HCC)   HTN (hypertension)   Depression   HLD (hyperlipidemia)   GERD (gastroesophageal reflux disease)   Diverticulitis   Anemia   Protein calorie malnutrition ([place])  Acute diverticulitis with pericolonic abscess - S/P IR drain, 11/22 - WBC WNL, afebrile, no pain  FEN: soft diet/low fiber VTE: SCD's, heparin ID: Zosyn 11/21>> Foley: none Follow up: [facility] surgeon in 6-8 weeks  DISPO: may want to check a C. Diff, otherwise okay for discharge from a surgical standpoint. 14 days total abx. Low fiber diet at discharge.     LOS: 4 days    Subjective: CC: diarrhea  No abdominal pain. No nausea, vomiting, fever or chills. Sometimes she has mild pain with a BM. Husband at bedside.   Objective: Vital signs in last 24 hours: Temp:  [97.5 F (36.4 C)-99.1 F (37.3 C)] 97.5 F (36.4 C) (11/25 0442) Pulse Rate:  [69-73] 70 (11/25 0442) Resp:  [16] 16 (11/25 0442) BP: (142-171)/(63-80) 171/80 (11/25 0442) SpO2:  [91 %-97 %] 91 % (11/25 0442) Last BM Date: 06/07/18  Intake/Output from previous day: 11/24 0701 - 11/25 0700 In: 1929 [P.O.:1200; I.V.:645; IV Piggyback:84] Out: 30 [Drains:30] Intake/Output this shift: Total I/O In: 440 [P.O.:440] Out: 4 [Urine:2; Stool:2]  PE: Gen:  Alert, NAD, pleasant, cooperative Pulm:  Rate and effort normal Abd: Soft, NT/ND, +BS, drain with scant dark serosanguinous output Skin: no rashes noted, warm and dry   Anti-infectives: Anti-infectives (From admission, onward)   Start     Dose/Rate Route Frequency Ordered Stop   06/05/18 0600  piperacillin-tazobactam (ZOSYN) IVPB 3.375 g     3.375 g 12.5 mL/hr over 240 Minutes Intravenous Every 8 hours 06/05/18 0017     06/04/18 2200  piperacillin-tazobactam (ZOSYN) IVPB 3.375 g     3.375 g 12.5 mL/hr over 240 Minutes  Intravenous  Once 06/04/18 2148 06/04/18 2255      Lab Results:  Recent Labs    06/07/18 0244 06/08/18 0707  WBC 9.9 10.5  HGB 9.0* 8.8*  HCT 27.8* 25.8*  PLT 317 315   BMET Recent Labs    06/07/18 0244 06/08/18 0707  NA 131* 132*  K 3.1* 3.3*  CL 102 106  CO2 19* 19*  GLUCOSE 156* 190*  BUN 15 7*  CREATININE 1.03* 0.98  CALCIUM 8.3* 7.9*   PT/INR Recent Labs    06/05/18 1400  LABPROT 14.6  INR 1.15   CMP     Component Value Date/Time   NA 132 (L) 06/08/2018 0707   K 3.3 (L) 06/08/2018 0707   CL 106 06/08/2018 0707   CO2 19 (L) 06/08/2018 0707   GLUCOSE 190 (H) 06/08/2018 0707   BUN 7 (L) 06/08/2018 0707   CREATININE 0.98 06/08/2018 0707   CREATININE 0.88 11/27/2010 0916   CALCIUM 7.9 (L) 06/08/2018 0707   PROT 6.0 (L) 06/04/2018 1819   ALBUMIN 2.6 (L) 06/04/2018 1819   AST 36 06/04/2018 1819   ALT 10 06/04/2018 1819   ALKPHOS 51 06/04/2018 1819   BILITOT 0.6 06/04/2018 1819   GFRNONAA 52 (L) 06/08/2018 0707   GFRAA >60 06/08/2018 0707   Lipase  No results found for: LIPASE  Studies/Results: No results found.    Kalman Drape , [facility] Surgery 06/08/2018, 11:22 AM  Pager: [phone] Mon-Wed, Friday 7:00am-4:30pm Thurs 7am-11:30am  Consults: [phone]

## 2018-06-09 LAB — BASIC METABOLIC PANEL
Anion gap: 9 (ref 5–15)
BUN: 6 mg/dL — ABNORMAL LOW (ref 8–23)
CO2: 18 mmol/L — AB (ref 22–32)
Calcium: 8.5 mg/dL — ABNORMAL LOW (ref 8.9–10.3)
Chloride: 105 mmol/L (ref 98–111)
Creatinine, Ser: 0.94 mg/dL (ref 0.44–1.00)
GFR calc non Af Amer: 55 mL/min — ABNORMAL LOW (ref >60)
Glucose, Bld: 211 mg/dL — ABNORMAL HIGH (ref 70–99)
Potassium: 5.1 mmol/L (ref 3.5–5.1)
Sodium: 132 mmol/L — ABNORMAL LOW (ref 135–145)

## 2018-06-09 LAB — C DIFFICILE QUICK SCREEN W PCR REFLEX
C DIFFICILE (CDIFF) INTERP: NOT DETECTED
C DIFFICILE (CDIFF) TOXIN: NEGATIVE
C Diff antigen: NEGATIVE

## 2018-06-09 LAB — CBC
HEMATOCRIT: 28.2 % — AB (ref 36.0–46.0)
Hemoglobin: 9.2 g/dL — ABNORMAL LOW (ref 12.0–15.0)
MCH: 30 pg (ref 26.0–34.0)
MCHC: 32.6 g/dL (ref 30.0–36.0)
MCV: 91.9 fL (ref 80.0–100.0)
NRBC: 0 % (ref 0.0–0.2)
PLATELETS: 382 10*3/uL (ref 150–400)
RBC: 3.07 MIL/uL — ABNORMAL LOW (ref 3.87–5.11)
RDW: 13 % (ref 11.5–15.5)
WBC: 11.9 10*3/uL — AB (ref 4.0–10.5)

## 2018-06-09 LAB — GLUCOSE, CAPILLARY
GLUCOSE-CAPILLARY: 173 mg/dL — AB (ref 70–99)
GLUCOSE-CAPILLARY: 162 mg/dL — AB (ref 70–99)
Glucose-Capillary: 185 mg/dL — ABNORMAL HIGH (ref 70–99)
Glucose-Capillary: 144 mg/dL — ABNORMAL HIGH (ref 70–99)

## 2018-06-09 MED ORDER — BARRIER CREAM NON-SPECIFIED
1.0000 "application " | TOPICAL_CREAM | Freq: Two times a day (BID) | TOPICAL | Status: DC | PRN
  Filled 2018-06-09: qty 1

## 2018-06-09 NOTE — Progress Notes (Signed)
PROGRESS NOTE    TAMI BLASS  ERX:[id number] DOB: 10-Sep-1933 DOA: 06/04/2018 PCP: Abner Greenspan, MD    Brief Narrative:  Stephanie [name] is a 84 y.o. female with medical history significant of anxiety, type 2 diabetes, hypertension, hyperlipidemia, diverticulosis presented to the hospital via EMS for evaluation of nausea and vomiting. CT abdomen pelvis with evidence of acute diverticulitis implicated with an abscess measuring 5.1 x 2.2 x 5.9 cm, free fluid in the pelvis thought to be likely reactive.  No free air to suggest perforation.  CT showing right lower lobe airspace disease concerning for pneumonia or aspiration. She was started on IV zosyn and IV fluids and admitted to [facility] service.   Assessment & Plan:   Acute diverticulitis with abscess formation -Started broad-spectrum antibiotics, general surgery was consulted -s/p IR guided perc drainage of the abscess on 06/05/2018.  Cultures negative.   -Was on IV Zosyn for 4 days, transition to oral Augmentin yesterday, plan for total 2-week course of antibiotics -Now with profuse diarrhea and weakness, likely induced by antibiotics/underlying infection -White count up slightly, monitor -Supportive care -FU C. difficile PCR Ambulate, physical therapy evaluation  Hypokalemia -Replaced  Anemia of chronic disease -Monitor, no indication for transfusion yet  Questionable pneumonia on CT abdomen and pelvis: -Clinically doubt this, on antibiotics regardless which would cover pulmonary source as well  Type 2 DM: A1c 5.7 -Continue sliding scale insulin  Severe protein caloric malnutrition -Continue supplement per nutrition  Hyperlipidemia:  -Continue home statin  Hypertension:  -Continue home metoprolol, amlodipine  Depression: Stable.  -Continue medications  AKI on CKD 3: Improved.   DVT prophylaxis: SQ heparin Code Status: full code.  Family Communication: Husband at bedside. Disposition Plan: home tomorrow if  stable.  Consultants:   IR  SURGERY.   Procedures: Percutaneous drain placement.   Antimicrobials: IV zosyn 11/21 to 11/25 P.o. Augmentin 11/25-12/13  Subjective: -feels very weak, having diarrhea, very poor appetite  Objective: Vitals:   06/08/18 0442 06/08/18 1429 06/08/18 2111 06/09/18 0611  BP: (!) 171/80 (!) 170/70 (!) 158/75 (!) 176/89  Pulse: 70 66 77 75  Resp: 16 18 18 18   Temp: (!) 97.5 F (36.4 C) 97.6 F (36.4 C) 98.5 F (36.9 C) 97.8 F (36.6 C)  TempSrc: Oral Oral Oral   SpO2: 91% 96% 91% 94%  Weight:      Height:        Intake/Output Summary (Last 24 hours) at 06/09/2018 1443 Last data filed at 06/09/2018 1300 Gross per 24 hour  Intake 555 ml  Output 11 ml  Net 544 ml   Filed Weights   06/04/18 1734  Weight: 49 kg    Examination: Gen: Frail chronically ill-appearing female, laying in bed, no distress HEENT: PERRLA, Neck supple, no JVD Lungs: Good air movement bilaterally, CTAB CVS: RRR,No Gallops,Rubs or new Murmurs Abd: soft, Non tender, non distended, BS present, drain noted Extremities: No edema Skin: no new rashes    Data Reviewed: I have personally reviewed following labs and imaging studies  CBC: Recent Labs  Lab 06/04/18 1819 06/05/18 0306 06/06/18 0441 06/07/18 0244 06/08/18 0707 06/09/18 0201  WBC 14.2* 12.3* 9.0 9.9 10.5 11.9*  NEUTROABS 12.4*  --   --   --   --   --   HGB 9.7* 9.2* 8.7* 9.0* 8.8* 9.2*  HCT 29.5* 27.7* 26.8* 27.8* 25.8* 28.2*  MCV 92.5 91.1 92.7 91.7 89.6 91.9  PLT 251 260 255 317 315 382   Basic  Metabolic Panel: Recent Labs  Lab 06/05/18 1156 06/06/18 0441 06/06/18 1802 06/07/18 0244 06/08/18 0707 06/09/18 0201  NA 132* 130*  --  131* 132* 132*  K 3.1* 3.2*  --  3.1* 3.3* 5.1  CL 104 101  --  102 106 105  CO2 19* 19*  --  19* 19* 18*  GLUCOSE 162* 127*  --  156* 190* 211*  BUN 30* 23  --  15 7* 6*  CREATININE 1.04* 1.18*  --  1.03* 0.98 0.94  CALCIUM 8.2* 8.2*  --  8.3* 7.9* 8.5*  MG   --   --  1.6*  --  1.9  --   PHOS  --   --  2.3*  --  3.0  --    GFR: Estimated Creatinine Clearance: 35.7 mL/min (by C-G formula based on SCr of 0.94 mg/dL). Liver Function Tests: Recent Labs  Lab 06/04/18 1819  AST 36  ALT 10  ALKPHOS 51  BILITOT 0.6  PROT 6.0*  ALBUMIN 2.6*   No results for input(s): LIPASE, AMYLASE in the last 168 hours. No results for input(s): AMMONIA in the last 168 hours. Coagulation Profile: Recent Labs  Lab 06/05/18 1400  INR 1.15   Cardiac Enzymes: No results for input(s): CKTOTAL, CKMB, CKMBINDEX, TROPONINI in the last 168 hours. BNP (last 3 results) No results for input(s): PROBNP in the last 8760 hours. HbA1C: No results for input(s): HGBA1C in the last 72 hours. CBG: Recent Labs  Lab 06/08/18 1209 06/08/18 1704 06/08/18 2221 06/09/18 0838 06/09/18 1243  GLUCAP 234* 157* 231* 185* 173*   Lipid Profile: No results for input(s): CHOL, HDL, LDLCALC, TRIG, CHOLHDL, LDLDIRECT in the last 72 hours. Thyroid Function Tests: No results for input(s): TSH, T4TOTAL, FREET4, T3FREE, THYROIDAB in the last 72 hours. Anemia Panel: No results for input(s): VITAMINB12, FOLATE, FERRITIN, TIBC, IRON, RETICCTPCT in the last 72 hours. Sepsis Labs: No results for input(s): PROCALCITON, LATICACIDVEN in the last 168 hours.  Recent Results (from the past 240 hour(s))  Aerobic/Anaerobic Culture (surgical/deep wound)     Status: None (Preliminary result)   Collection Time: 06/05/18  4:06 PM  Result Value Ref Range Status   Specimen Description ABSCESS ABDOMEN  Final   Special Requests Normal  Final   Gram Stain NO WBC SEEN NO ORGANISMS SEEN   Final   Culture   Final    NO GROWTH 4 DAYS NO ANAEROBES ISOLATED; CULTURE IN PROGRESS FOR 5 DAYS Performed at [facility], [address] [place], [place] [address]    Report Status PENDING  Incomplete  C difficile quick scan w PCR reflex     Status: None   Collection Time: 06/09/18  9:19 AM  Result  Value Ref Range Status   C Diff antigen NEGATIVE NEGATIVE Final   C Diff toxin NEGATIVE NEGATIVE Final   C Diff interpretation No C. difficile detected.  Final    Comment: Performed at [facility], [place] [address], [place], [place] [address]         Radiology Studies: No results found.      Scheduled Meds: . amLODipine  5 mg Oral Daily  . amoxicillin-clavulanate  1 tablet Oral Q8H  . atorvastatin  20 mg Oral Daily  . cyanocobalamin  1,000 mcg Intramuscular Daily  . famotidine  40 mg Oral Daily  . feeding supplement (GLUCERNA SHAKE)  237 mL Oral TID BM  . ferrous sulfate  325 mg Oral Q breakfast  .  heparin  5,000 Units Subcutaneous Q8H  . insulin aspart  0-9 Units Subcutaneous TID WC  . metoprolol succinate  100 mg Oral Daily  . multivitamin with minerals  1 tablet Oral Daily  . PARoxetine  40 mg Oral Daily  . sodium chloride flush  5 mL Intracatheter Q8H   Continuous Infusions: . sodium chloride 1,000 mL (06/08/18 1327)     LOS: 5 days   Domenic Polite, MD [facility] Page via [email or web address]  If 7PM-7AM, please contact night-coverage [email or web address] Password [facility] 06/09/2018, 2:43 PM

## 2018-06-09 NOTE — Plan of Care (Signed)

## 2018-06-09 NOTE — Progress Notes (Signed)
[facility]/Trauma Progress Note      Assessment/Plan Principal Problem:   Colonic diverticular abscess Active Problems:   Type 2 diabetes mellitus (HCC)   HTN (hypertension)   Depression   HLD (hyperlipidemia)   GERD (gastroesophageal reflux disease)   Diverticulitis   Anemia   Protein calorie malnutrition ([place])  Acute diverticulitis with pericolonic abscess - S/P IR drain, 11/22 - afebrile, no pain - WBC up slightly, checking C diff in setting of diarrhea  FEN: soft diet/low fiber VTE: SCD's, heparin ID: Zosyn 11/21>> Foley: none Follow up: [facility] surgeon in 6-8 weeks  DISPO: C. Diff pending, otherwise okay for discharge from a surgical standpoint. 14 days total abx. Low fiber diet at discharge.    LOS: 5 days    Subjective: CC: diarrhea  Pt is still having copious watery diarrhea. Her bottom is sore. No abdominal pain. No nausea, vomiting, fever or chills. Husband at bedside.   Objective: Vital signs in last 24 hours: Temp:  [97.6 F (36.4 C)-98.5 F (36.9 C)] 97.8 F (36.6 C) (11/26 0611) Pulse Rate:  [66-77] 75 (11/26 0611) Resp:  [18] 18 (11/26 0611) BP: (158-176)/(70-89) 176/89 (11/26 0611) SpO2:  [91 %-96 %] 94 % (11/26 0611) Last BM Date: 06/08/18  Intake/Output from previous day: 11/25 0701 - 11/26 0700 In: 1500 [P.O.:990; I.V.:500] Out: 5 [Drains:5] Intake/Output this shift: No intake/output data recorded.  PE: Gen:  Alert, NAD, pleasant, cooperative Pulm:  Rate and effort normal Abd: Soft, NT/ND, drain with scant dark serosanguinous output Skin: no rashes noted, warm and dry  Anti-infectives: Anti-infectives (From admission, onward)   Start     Dose/Rate Route Frequency Ordered Stop   06/08/18 1400  amoxicillin-clavulanate (AUGMENTIN) 500-125 MG per tablet 500 mg     1 tablet Oral Every 8 hours 06/08/18 1324     06/05/18 0600  piperacillin-tazobactam (ZOSYN) IVPB 3.375 g  Status:  Discontinued     3.375 g 12.5 mL/hr over  240 Minutes Intravenous Every 8 hours 06/05/18 0017 06/08/18 1324   06/04/18 2200  piperacillin-tazobactam (ZOSYN) IVPB 3.375 g     3.375 g 12.5 mL/hr over 240 Minutes Intravenous  Once 06/04/18 2148 06/04/18 2255      Lab Results:  Recent Labs    06/08/18 0707 06/09/18 0201  WBC 10.5 11.9*  HGB 8.8* 9.2*  HCT 25.8* 28.2*  PLT 315 382   BMET Recent Labs    06/08/18 0707 06/09/18 0201  NA 132* 132*  K 3.3* 5.1  CL 106 105  CO2 19* 18*  GLUCOSE 190* 211*  BUN 7* 6*  CREATININE 0.98 0.94  CALCIUM 7.9* 8.5*   PT/INR No results for input(s): LABPROT, INR in the last 72 hours. CMP     Component Value Date/Time   NA 132 (L) 06/09/2018 0201   K 5.1 06/09/2018 0201   CL 105 06/09/2018 0201   CO2 18 (L) 06/09/2018 0201   GLUCOSE 211 (H) 06/09/2018 0201   BUN 6 (L) 06/09/2018 0201   CREATININE 0.94 06/09/2018 0201   CREATININE 0.88 11/27/2010 0916   CALCIUM 8.5 (L) 06/09/2018 0201   PROT 6.0 (L) 06/04/2018 1819   ALBUMIN 2.6 (L) 06/04/2018 1819   AST 36 06/04/2018 1819   ALT 10 06/04/2018 1819   ALKPHOS 51 06/04/2018 1819   BILITOT 0.6 06/04/2018 1819   GFRNONAA 55 (L) 06/09/2018 0201   GFRAA >60 06/09/2018 0201   Lipase  No results found for: LIPASE  Studies/Results: No results found.  Kalman Drape , [facility] Surgery 06/09/2018, 9:18 AM  Pager: [phone] Mon-Wed, Friday 7:00am-4:30pm Thurs 7am-11:30am  Consults: [phone]

## 2018-06-09 NOTE — Care Management Important Message (Signed)
Important Message  Patient Details  Name: Stephanie [name] MRN: [id number] Date of Birth: 02-Aug-1933   Medicare Important Message Given:  Yes    [name] 06/09/2018, 4:13 PM

## 2018-06-10 LAB — BASIC METABOLIC PANEL
ANION GAP: 11 (ref 5–15)
BUN: 7 mg/dL — ABNORMAL LOW (ref 8–23)
CALCIUM: 8.7 mg/dL — AB (ref 8.9–10.3)
CO2: 20 mmol/L — ABNORMAL LOW (ref 22–32)
CREATININE: 0.93 mg/dL (ref 0.44–1.00)
Chloride: 99 mmol/L (ref 98–111)
GFR, EST NON AFRICAN AMERICAN: 57 mL/min — AB (ref >60)
Glucose, Bld: 185 mg/dL — ABNORMAL HIGH (ref 70–99)
Potassium: 4.1 mmol/L (ref 3.5–5.1)
SODIUM: 130 mmol/L — AB (ref 135–145)

## 2018-06-10 LAB — CBC
HEMATOCRIT: 28.7 % — AB (ref 36.0–46.0)
HEMOGLOBIN: 9.6 g/dL — AB (ref 12.0–15.0)
MCH: 30.6 pg (ref 26.0–34.0)
MCHC: 33.4 g/dL (ref 30.0–36.0)
MCV: 91.4 fL (ref 80.0–100.0)
Platelets: 471 10*3/uL — ABNORMAL HIGH (ref 150–400)
RBC: 3.14 MIL/uL — AB (ref 3.87–5.11)
RDW: 12.8 % (ref 11.5–15.5)
WBC: 12.2 10*3/uL — ABNORMAL HIGH (ref 4.0–10.5)
nRBC: 0.2 % (ref 0.0–0.2)

## 2018-06-10 LAB — GLUCOSE, CAPILLARY
GLUCOSE-CAPILLARY: 189 mg/dL — AB (ref 70–99)
GLUCOSE-CAPILLARY: 205 mg/dL — AB (ref 70–99)

## 2018-06-10 MED ORDER — AMOXICILLIN-POT CLAVULANATE 500-125 MG PO TABS: 1.0000 | ORAL_TABLET | Freq: Three times a day (TID) | ORAL | 0 refills | Status: AC

## 2018-06-10 MED ORDER — ASPIRIN-DIPYRIDAMOLE ER 25-200 MG PO CP12: 1.0000 | ORAL_CAPSULE | Freq: Two times a day (BID) | ORAL | Status: DC

## 2018-06-10 MED ORDER — AMLODIPINE BESYLATE 10 MG PO TABS
10.0000 mg | ORAL_TABLET | Freq: Every day | ORAL | Status: DC
  Administered 2018-06-10: 10 mg via ORAL
  Filled 2018-06-10: qty 1

## 2018-06-10 NOTE — Care Management Note (Addendum)
Case Management Note  Patient Details  Name: Stephanie [name] MRN: [id number] Date of Birth: 30-Aug-1933  Subjective/Objective:                    Action/Plan:  Confirmed face sheet information with patient and husband at bedside.  Expected Discharge Date:  06/10/18               Expected Discharge Plan:  [place]  In-House Referral:     Discharge planning Services  CM Consult  Post Acute Care Choice:  Home Health Choice offered to:  Patient, Spouse  DME Arranged:  N/A DME Agency:  NA  HH Arranged:  RN, PT HH Agency:   [facility]  Status of Service:  In process, will continue to follow  If discussed at Long Length of Stay Meetings, dates discussed:    Additional Comments:  Marilu Favre, RN 06/10/2018, 1:53 PM

## 2018-06-10 NOTE — Progress Notes (Signed)
Discharge instructions reviewed with patient and her spouse.  These included, but were not limited to, the following:  Drain care and irrigation of drain, dietary recommendations, activity, prescriptions, management of pain, follow-up appointments, and home plan of care.  Comprehension of shared information was ascertained via "teach-back" technique.  Patient discharged to private home with husband via private vehicle.  Accompanied to exit via wheelchair and escorted by volunteer.

## 2018-06-10 NOTE — Progress Notes (Signed)
[facility] Progress Note      Assessment/Plan Principal Problem: Colonic diverticular abscess Active Problems: Type 2 diabetes mellitus (HCC) HTN (hypertension) Depression HLD (hyperlipidemia) GERD (gastroesophageal reflux disease) Diverticulitis Anemia Protein calorie malnutrition ([place])  Acute diverticulitis with pericolonic abscess - S/P IR drain, 11/22 - afebrile, no pain, tolerating diet, diarrhea has slowed - WBC up slightly, C diff neg  EXB:MWUX diet/low fiber VTE: SCD's,heparin LK:GMWNU 11/21>> Foley:none Follow up:[facility] surgeon in 6-8 weeks  DISPO:okay for discharge from a surgical standpoint. 14 days total abx. Low fiber diet at discharge. Continue drain at discharge.Drain management per IR.    LOS: 6 days    Subjective: CC: diverticulitis  No abdominal pain. Resting. No nausea or vomiting. Diarrhea has slowed down. Husband not at bedside.   Objective: Vital signs in last 24 hours: Temp:  [97.5 F (36.4 C)-98.7 F (37.1 C)] 97.5 F (36.4 C) (11/27 0556) Pulse Rate:  [78-82] 82 (11/27 0556) Resp:  [16] 16 (11/26 2129) BP: (150-170)/(56-85) 170/56 (11/27 0556) SpO2:  [91 %-96 %] 91 % (11/27 0556) Last BM Date: 06/09/18  Intake/Output from previous day: 11/26 0701 - 11/27 0700 In: 12 [I.V.:2] Out: 15 [Drains:15] Intake/Output this shift: No intake/output data recorded.  PE: Gen: Alert, NAD, pleasant, sleeping Pulm:Rate andeffort normal Abd: Soft, NT/ND Skin: no rashes noted, warm and dry  Anti-infectives: Anti-infectives (From admission, onward)   Start     Dose/Rate Route Frequency Ordered Stop   06/08/18 1400  amoxicillin-clavulanate (AUGMENTIN) 500-125 MG per tablet 500 mg     1 tablet Oral Every 8 hours 06/08/18 1324     06/05/18 0600  piperacillin-tazobactam (ZOSYN) IVPB 3.375 g  Status:  Discontinued     3.375 g 12.5 mL/hr over 240 Minutes Intravenous Every 8 hours 06/05/18 0017  06/08/18 1324   06/04/18 2200  piperacillin-tazobactam (ZOSYN) IVPB 3.375 g     3.375 g 12.5 mL/hr over 240 Minutes Intravenous  Once 06/04/18 2148 06/04/18 2255      Lab Results:  Recent Labs    06/09/18 0201 06/10/18 0537  WBC 11.9* 12.2*  HGB 9.2* 9.6*  HCT 28.2* 28.7*  PLT 382 471*   BMET Recent Labs    06/09/18 0201 06/10/18 0537  NA 132* 130*  K 5.1 4.1  CL 105 99  CO2 18* 20*  GLUCOSE 211* 185*  BUN 6* 7*  CREATININE 0.94 0.93  CALCIUM 8.5* 8.7*   PT/INR No results for input(s): LABPROT, INR in the last 72 hours. CMP     Component Value Date/Time   NA 130 (L) 06/10/2018 0537   K 4.1 06/10/2018 0537   CL 99 06/10/2018 0537   CO2 20 (L) 06/10/2018 0537   GLUCOSE 185 (H) 06/10/2018 0537   BUN 7 (L) 06/10/2018 0537   CREATININE 0.93 06/10/2018 0537   CREATININE 0.88 11/27/2010 0916   CALCIUM 8.7 (L) 06/10/2018 0537   PROT 6.0 (L) 06/04/2018 1819   ALBUMIN 2.6 (L) 06/04/2018 1819   AST 36 06/04/2018 1819   ALT 10 06/04/2018 1819   ALKPHOS 51 06/04/2018 1819   BILITOT 0.6 06/04/2018 1819   GFRNONAA 57 (L) 06/10/2018 0537   GFRAA >60 06/10/2018 0537   Lipase  No results found for: LIPASE  Studies/Results: No results found.    Kalman Drape , [facility] 06/10/2018, 8:36 AM  Pager: [phone] Mon-Wed, Friday 7:00am-4:30pm Thurs 7am-11:30am  Consults: [phone]

## 2018-06-10 NOTE — Plan of Care (Signed)
  Problem: Education: Goal: Knowledge of General Education information will improve Description Including pain rating scale, medication(s)/side effects and non-pharmacologic comfort measures Outcome: Adequate for Discharge   Problem: Health Behavior/Discharge Planning: Goal: Ability to manage health-related needs will improve Outcome: Adequate for Discharge   Problem: Clinical Measurements: Goal: Ability to maintain clinical measurements within normal limits will improve Outcome: Adequate for Discharge Goal: Will remain free from infection Outcome: Adequate for Discharge Goal: Diagnostic test results will improve Outcome: Adequate for Discharge Goal: Respiratory complications will improve Outcome: Adequate for Discharge Goal: Cardiovascular complication will be avoided Outcome: Adequate for Discharge   Problem: Activity: Goal: Risk for activity intolerance will decrease Outcome: Adequate for Discharge   Problem: Nutrition: Goal: Adequate nutrition will be maintained Outcome: Adequate for Discharge   Problem: Coping: Goal: Level of anxiety will decrease Outcome: Adequate for Discharge   Problem: Elimination: Goal: Will not experience complications related to bowel motility Outcome: Adequate for Discharge Goal: Will not experience complications related to urinary retention Outcome: Adequate for Discharge   Problem: Pain Managment: Goal: General experience of comfort will improve Outcome: Adequate for Discharge   Problem: Safety: Goal: Ability to remain free from injury will improve Outcome: Adequate for Discharge   Problem: Skin Integrity: Goal: Risk for impaired skin integrity will decrease Outcome: Adequate for Discharge   Problem: Education: Goal: Ability to describe self-care measures that may prevent or decrease complications (Diabetes Survival Skills Education) will improve Outcome: Adequate for Discharge Goal: Individualized Educational Video(s) Outcome:  Adequate for Discharge   Problem: Education: Goal: Individualized Educational Video(s) Outcome: Adequate for Discharge

## 2018-06-10 NOTE — Progress Notes (Signed)
Referring Physician(s): Ingram,H  Supervising Physician: Markus Daft  Patient Status:  [facility] - In-pt  Chief Complaint:  Pelvic abscess  Subjective: Pt doing ok today; denies worsening abd pain,N/V   Allergies: Ace inhibitors; Amlodipine besy-benazepril hcl; Clopidogrel bisulfate; and Erythromycin  Medications: Prior to Admission medications   Medication Sig Start Date End Date Taking? Authorizing Provider  amLODipine (NORVASC) 5 MG tablet Take 1 tablet (5 mg total) by mouth daily. 02/16/18  Yes Tower, Wynelle Fanny, MD  atorvastatin (LIPITOR) 20 MG tablet TAKE 1 TABLET BY MOUTH ONCE DAILY Patient taking differently: Take 20 mg by mouth daily.  04/27/18  Yes Tower, Wynelle Fanny, MD  chlorthalidone (HYGROTON) 25 MG tablet Take 1 tablet (25 mg total) by mouth daily. 08/27/17  Yes Tower, Wynelle Fanny, MD  famotidine (PEPCID) 40 MG tablet Take 1 tablet (40 mg total) by mouth daily. 04/28/18  Yes Thornton Park, MD  glipiZIDE (GLIPIZIDE XL) 10 MG 24 hr tablet TAKE 1 TABLET BY MOUTH ONCE DAILY WITH BREAKFAST Patient taking differently: Take 10 mg by mouth daily with breakfast.  02/16/18  Yes Tower, Wynelle Fanny, MD  metFORMIN (GLUCOPHAGE) 1000 MG tablet Take 1 tablet (1,000 mg total) by mouth 2 (two) times daily with a meal. 02/16/18  Yes Tower, Marne A, MD  metoprolol succinate (TOPROL-XL) 100 MG 24 hr tablet TAKE 1 TABLET BY MOUTH ONCE DAILY TAKE  WITH  OR  IMMEDIATELY  FOLLOWING  A  MEAL Patient taking differently: Take 100 mg by mouth daily.  02/16/18  Yes Tower, Wynelle Fanny, MD  PARoxetine (PAXIL) 40 MG tablet Take 1 tablet (40 mg total) by mouth daily. 08/27/17  Yes Tower, Wynelle Fanny, MD  acetaminophen (TYLENOL) 325 MG tablet Take 2 tablets (650 mg total) by mouth every 6 (six) hours as needed. Patient not taking: Reported on 06/04/2018 01/21/18   Marchia Bond, MD  dipyridamole-aspirin [facility]) 200-25 MG 12hr capsule Take 1 capsule by mouth 2 (two) times daily. RESTART THIS IN 30days when you come off  ELIQUIS Patient not taking: Reported on 06/04/2018 02/24/18   Domenic Polite, MD     Vital Signs: BP (!) 170/56 (BP Location: Right Arm)   Pulse 82   Temp (!) 97.5 F (36.4 C) (Oral)   Resp 16   Ht 5\' 2"  (1.575 m)   Wt 108 lb (49 kg)   SpO2 91%   BMI 19.75 kg/m   Physical Exam awake/alert; left TG drain intact, site not sig tender, output 15 cc light brown fluid  Imaging: No results found.  Labs:  CBC: Recent Labs    06/07/18 0244 06/08/18 0707 06/09/18 0201 06/10/18 0537  WBC 9.9 10.5 11.9* 12.2*  HGB 9.0* 8.8* 9.2* 9.6*  HCT 27.8* 25.8* 28.2* 28.7*  PLT 317 315 382 471*    COAGS: Recent Labs    01/21/18 1323 06/05/18 1400  INR 1.09 1.15    BMP: Recent Labs    06/07/18 0244 06/08/18 0707 06/09/18 0201 06/10/18 0537  NA 131* 132* 132* 130*  K 3.1* 3.3* 5.1 4.1  CL 102 106 105 99  CO2 19* 19* 18* 20*  GLUCOSE 156* 190* 211* 185*  BUN 15 7* 6* 7*  CALCIUM 8.3* 7.9* 8.5* 8.7*  CREATININE 1.03* 0.98 0.94 0.93  GFRNONAA 49* 52* 55* 57*  GFRAA 57* >60 >60 >60    LIVER FUNCTION TESTS: Recent Labs    08/27/17 1251 02/09/18 0856 06/04/18 1819  BILITOT 0.4 0.5 0.6  AST 14 14 36  ALT 6 7 10   ALKPHOS 46 89 51  PROT 7.2 7.3 6.0*  ALBUMIN 4.3 3.9 2.6*    Assessment and Plan: Pt with hx diverticular abscess, s/p drainage 11/22; afebrile; WBC 12.2(11.9), hgb 9.6(9.2), creat nl; c diff neg; drain fluid cx pend; as OP rec once daily flushing of drain with 5-10 cc sterile NS, output recording and dressing changes every 1-2 days; she will be set up for IR drain clinic f/u in 10-14 days; output card and saline flush prescription given to pt   Electronically Signed: D. Rowe Robert, PA-C 06/10/2018, 10:32 AM   I spent a total of 20 minutes at the the patient's bedside AND on the patient's hospital floor or unit, greater than 50% of which was counseling/coordinating care for pelvic abscess drain    Patient ID: Stephanie [name], female   DOB:  17-Sep-1933, 84 y.o.   MRN: [id number]

## 2018-06-10 NOTE — Discharge Summary (Signed)
Physician Discharge Summary  Stephanie [name] GQQ:[id number] DOB: Jun 24, 1934 DOA: 06/04/2018  PCP: Stephanie Greenspan, MD  Admit date: 06/04/2018 Discharge date: 06/10/2018  Time spent: 35 minutes  Recommendations for Outpatient Follow-up:  PCP in 1 week IR drain clinic in 10-14days, office will call Home health Pt and RN General surgery Dr. Dema [name] in 4 weeks  Discharge Diagnoses:  Principal Problem:   Colonic diverticular abscess   Sepsis   Type 2 diabetes mellitus ([place])   HTN (hypertension)   Depression   HLD (hyperlipidemia)   GERD (gastroesophageal reflux disease)   Diverticulitis   Anemia   Protein calorie malnutrition ([place])   Discharge Condition: stable  Diet recommendation: soft diet  Filed Weights   06/04/18 1734  Weight: 49 kg    History of present illness:  Stephanie [name] a 84 y.o.femalewith medical history significant ofanxiety, type 2 diabetes, hypertension, hyperlipidemia,diverticulosispresented to the hospital via EMS for evaluation of nausea andvomiting. CT abdomen pelvis with evidence of acute diverticulitis implicated with an abscess measuring 5.1 x 2.2 x 5.9 cm,free fluid in the pelvis thought to be likely reactive. No free air to suggest perforation.CT showing right lower lobe airspace disease concerning for pneumonia or aspiration.  Hospital Course:   Acute diverticulitis with abscess formation -Started broad-spectrum antibiotics, general surgery was consulted -s/p IR guided perc drainage of the abscess on 06/05/2018.  Cultures negative.   -Was on IV Zosyn for 4 days, transitioned to oral Augmentin, plan for total 10 course of antibiotics -then developed diarrhea, likely induced by antibiotics/underlying infection, Cdiff negative, diarrhea improving now -tolerating diet -stable for DC home with general surgery, IR drain clinic and PCP FU -discharged home with drain and home health services   Hypokalemia -Replaced  Anemia of chronic  disease -stable  Questionable pneumonia on CT abdomen and pelvis: -Clinically doubt this, on antibiotics regardless which would cover pulmonary source as well  Type 2 DM: A1c 5.7 -Not on medications at baseline, sliding scale insulin used inpatient  Severe protein caloric malnutrition -Continue supplement per nutrition  Hyperlipidemia:  -Continue home statin  Hypertension:  -Continue home metoprolol, amlodipine  Depression: Stable.  -Continue medications  AKI on CKD 3: Improved.   Discharge Exam: Vitals:   06/10/18 0556 06/10/18 1308  BP: (!) 170/56 129/76  Pulse: 82 76  Resp:  16  Temp: (!) 97.5 F (36.4 C) 99.2 F (37.3 C)  SpO2: 91% 96%    General: AAOx3 Cardiovascular: S1S2/RRR Respiratory: CTAB  Discharge Instructions   Discharge Instructions    Diet - low sodium heart healthy   Complete by:  As directed    Increase activity slowly   Complete by:  As directed      Allergies as of 06/10/2018      Reactions   Ace Inhibitors Other (See Comments)   REACTION: generic, reaction not known   Amlodipine Besy-benazepril Hcl Other (See Comments)   REACTION: chest pain   Clopidogrel Bisulfate Other (See Comments)   REACTION: GI   Erythromycin Other (See Comments)   REACTION: GI upset      Medication List    STOP taking these medications   chlorthalidone 25 MG tablet Commonly known as:  HYGROTON     TAKE these medications   acetaminophen 325 MG tablet Commonly known as:  TYLENOL Take 2 tablets (650 mg total) by mouth every 6 (six) hours as needed.   amLODipine 5 MG tablet Commonly known as:  NORVASC Take 1 tablet (5 mg total) by  mouth daily.   amoxicillin-clavulanate 500-125 MG tablet Commonly known as:  AUGMENTIN Take 1 tablet (500 mg total) by mouth every 8 (eight) hours for 4 days.   atorvastatin 20 MG tablet Commonly known as:  LIPITOR TAKE 1 TABLET BY MOUTH ONCE DAILY   dipyridamole-aspirin 200-25 MG 12hr capsule Commonly  known as:  AGGRENOX Take 1 capsule by mouth 2 (two) times daily. What changed:  additional instructions   famotidine 40 MG tablet Commonly known as:  PEPCID Take 1 tablet (40 mg total) by mouth daily.   glipiZIDE 10 MG 24 hr tablet Commonly known as:  GLUCOTROL XL TAKE 1 TABLET BY MOUTH ONCE DAILY WITH BREAKFAST What changed:    how much to take  how to take this  when to take this  additional instructions   metFORMIN 1000 MG tablet Commonly known as:  GLUCOPHAGE Take 1 tablet (1,000 mg total) by mouth 2 (two) times daily with a meal.   metoprolol succinate 100 MG 24 hr tablet Commonly known as:  TOPROL-XL TAKE 1 TABLET BY MOUTH ONCE DAILY TAKE  WITH  OR  IMMEDIATELY  FOLLOWING  A  MEAL What changed:    how much to take  how to take this  when to take this  additional instructions   PARoxetine 40 MG tablet Commonly known as:  PAXIL Take 1 tablet (40 mg total) by mouth daily.      Allergies  Allergen Reactions  . Ace Inhibitors Other (See Comments)    REACTION: generic, reaction not known  . Amlodipine Besy-Benazepril Hcl Other (See Comments)    REACTION: chest pain  . Clopidogrel Bisulfate Other (See Comments)    REACTION: GI  . Erythromycin Other (See Comments)    REACTION: GI upset   Follow-up Information    Stephanie Roup, MD. Schedule an appointment as soon as possible for a visit in 8 week(s).   Specialty:  General Surgery Why:  to discuss elective surgery to remove part of colon with diverticulitis  Contact information: [address] [place] [address] [facility] up.   Why:  Follow-up 10-14 days for TG drain - IR scheduler will call you with appointment day and time.  Contact information: [place] [address] [phone]        [facility] - Dme Follow up.   Why:  home health Contact information: [address] [place]  [address] [phone]            The results of significant diagnostics from this hospitalization (including imaging, microbiology, ancillary and laboratory) are listed below for reference.    Significant Diagnostic Studies: Dg Chest 2 View  Result Date: 06/04/2018 CLINICAL DATA:  Weakness and cough x1 week, recent colonoscopy. EXAM: CHEST - 2 VIEW COMPARISON:  01/21/2018 FINDINGS: Normal heart size. Moderate aortic atherosclerosis at the arch without aneurysm. Chronic interstitial prominence is noted of the lungs without pulmonary consolidation, effusion pneumothorax. Calcified densities project over the lung bases compatible with soft tissue calcifications within the breasts on prior exam. There appears be a large eggshell calcification in the left upper quadrant of the abdomen measuring approximately 2.6 x 1.8 cm on the frontal view possibly representing a calcified splenic artery aneurysm, calcified splenic cyst or calcified diverticulum. IMPRESSION: 1. No active pulmonary disease. Aortic atherosclerosis. 2. Chronic interstitial prominence is noted of the lungs without pulmonary consolidation, effusion, pneumothorax. Electronically  Signed   By: Ashley Royalty M.D.   On: 06/04/2018 19:43   Ct Abdomen Pelvis W Contrast  Result Date: 06/04/2018 CLINICAL DATA:  Abdominal pain, acute, generalized. Colonoscopy 1 week ago. Dark red blood in stool. Nausea, vomiting, and diarrhea. EXAM: CT ABDOMEN AND PELVIS WITH CONTRAST TECHNIQUE: Multidetector CT imaging of the abdomen and pelvis was performed using the standard protocol following bolus administration of intravenous contrast. CONTRAST:  135mL OMNIPAQUE IOHEXOL 300 MG/ML  SOLN COMPARISON:  CT of the abdomen and pelvis 9/12/5 FINDINGS: Lower chest: Posteromedial right lower lobe airspace disease is concerning for infection or aspiration. Minimal atelectasis is present at the right base. The heart size is normal. A small hiatal hernia is present. No  significant pleural or pericardial effusion is present. Hepatobiliary: No focal liver abnormality is seen. Status post cholecystectomy. No biliary dilatation. Pancreas: Unremarkable. No pancreatic ductal dilatation or surrounding inflammatory changes. Spleen: Normal in size without focal abnormality. Adrenals/Urinary Tract: Adrenal glands are normal bilaterally. Kidneys and ureters are within normal limits. There is no stone or mass lesion. No obstruction is present. The urinary bladder is within normal limits. Stomach/Bowel: The stomach is otherwise normal. Duodenum is within normal limits as well. Small bowel is unremarkable. Terminal ileum is within normal limits. Appendix is visualized and normal. The ascending and transverse colon are within normal limits. Descending colon is unremarkable. Diverticular changes are present distally. Diverticular changes extend into the sigmoid colon. There is focal inflammation the distal sigmoid colon, above the rectum. Adjacent peripherally enhancing fluid collection in the left anatomic pelvis measures 5.1 x 2.2 x 5.9 cm. Vascular/Lymphatic: No significant adenopathy is present. Atherosclerotic calcifications are present in the aorta without aneurysm. Reproductive: Status post hysterectomy. No adnexal masses. Other: The a small amount of free fluid is present in the anatomic pelvis separate from the peripherally enhancing collection. There is no free air. No ventral hernia is present. Musculoskeletal: Left total hip arthroplasty is present. Bony pelvis is otherwise unremarkable. Degenerative grade 1 anterolisthesis is present at L4-5 with uncovering of a broad-based disc protrusion leading to central and bilateral foraminal narrowing. Central and bilateral foraminal narrowing is also present at L5-S1. IMPRESSION: 1. Sigmoid diverticular disease with inflammatory changes and pericolonic fluid collection likely reflecting acute diverticulitis complicated with abscess measuring  5.1 x 2.2 x 5.9 cm. 2. Additional free fluid in the anatomic pelvis is likely reactive. 3. No free air to suggest perforation. 4. Right lower lobe airspace disease concerning for pneumonia or aspiration. 5. Degenerative changes in the lower lumbar spine. These results were called by telephone at the time of interpretation on 06/04/2018 at 8:56 pm to Dr. Veryl Speak , who verbally acknowledged these results. Electronically Signed   By: San Morelle M.D.   On: 06/04/2018 20:56   Ct Image Guided Drainage By Percutaneous Catheter  Result Date: 06/05/2018 INDICATION: Left pelvic abscess concern for diverticulitis EXAM: CT guided left trans gluteal pelvic fluid collection drain MEDICATIONS: The patient is currently admitted to the hospital and receiving intravenous antibiotics. The antibiotics were administered within an appropriate time frame prior to the initiation of the procedure. ANESTHESIA/SEDATION: Fentanyl 50 mcg IV; Versed 1.0 mg IV Moderate Sedation Time:  13 minutes The patient was continuously monitored during the procedure by the interventional radiology nurse under my direct supervision. COMPLICATIONS: None immediate. PROCEDURE: Informed written consent was obtained from the patient after a thorough discussion of the procedural risks, benefits and alternatives. All questions were addressed. Maximal Sterile Barrier Technique was utilized  including caps, mask, sterile gowns, sterile gloves, sterile drape, hand hygiene and skin antiseptic. A timeout was performed prior to the initiation of the procedure. No previous imaging reviewed. Patient position prone. Noncontrast localization CT performed. The posterior left pelvic fluid collection was localized and marked for a left trans gluteal posterior approach. Under sterile conditions and local anesthesia, a 18 gauge 10 cm access needle was advanced from a left posterior trans gluteal approach into the fluid collection. Needle position confirmed with  CT. Syringe aspiration yielded dark thin fluid suspicious for chronic hematoma. Sample sent for culture. Guidewire inserted followed by tract dilatation to insert a 10 [place] drain. Drain catheter position confirmed with CT. Catheter secured with Prolene suture and connected to external suction bulb. Sterile dressing applied. No immediate complication. Patient tolerated the procedure well. IMPRESSION: Successful CT-guided left pelvic fluid collection drain placement as above. Electronically Signed   By: Jerilynn Mages.  Shick M.D.   On: 06/05/2018 16:09    Microbiology: Recent Results (from the past 240 hour(s))  Aerobic/Anaerobic Culture (surgical/deep wound)     Status: None (Preliminary result)   Collection Time: 06/05/18  4:06 PM  Result Value Ref Range Status   Specimen Description ABSCESS ABDOMEN  Final   Special Requests Normal  Final   Gram Stain NO WBC SEEN NO ORGANISMS SEEN   Final   Culture   Final    NO GROWTH 5 DAYS NO ANAEROBES ISOLATED; CULTURE IN PROGRESS FOR 5 DAYS Performed at [facility], [address], [place], [place] [address]    Report Status PENDING  Incomplete  C difficile quick scan w PCR reflex     Status: None   Collection Time: 06/09/18  9:19 AM  Result Value Ref Range Status   C Diff antigen NEGATIVE NEGATIVE Final   C Diff toxin NEGATIVE NEGATIVE Final   C Diff interpretation No C. difficile detected.  Final    Comment: Performed at [facility], [place] [address], [place], [place] [address]     Labs: Basic Metabolic Panel: Recent Labs  Lab 06/06/18 0441 06/06/18 1802 06/07/18 0244 06/08/18 0707 06/09/18 0201 06/10/18 0537  NA 130*  --  131* 132* 132* 130*  K 3.2*  --  3.1* 3.3* 5.1 4.1  CL 101  --  102 106 105 99  CO2 19*  --  19* 19* 18* 20*  GLUCOSE 127*  --  156* 190* 211* 185*  BUN 23  --  15 7* 6* 7*  CREATININE 1.18*  --  1.03* 0.98 0.94 0.93  CALCIUM 8.2*  --  8.3* 7.9* 8.5* 8.7*  MG  --  1.6*  --  1.9  --   --   PHOS  --  2.3*  --   3.0  --   --    Liver Function Tests: Recent Labs  Lab 06/04/18 1819  AST 36  ALT 10  ALKPHOS 51  BILITOT 0.6  PROT 6.0*  ALBUMIN 2.6*   No results for input(s): LIPASE, AMYLASE in the last 168 hours. No results for input(s): AMMONIA in the last 168 hours. CBC: Recent Labs  Lab 06/04/18 1819  06/06/18 0441 06/07/18 0244 06/08/18 0707 06/09/18 0201 06/10/18 0537  WBC 14.2*   < > 9.0 9.9 10.5 11.9* 12.2*  NEUTROABS 12.4*  --   --   --   --   --   --   HGB 9.7*   < > 8.7* 9.0* 8.8* 9.2* 9.6*  HCT 29.5*   < >  26.8* 27.8* 25.8* 28.2* 28.7*  MCV 92.5   < > 92.7 91.7 89.6 91.9 91.4  PLT 251   < > 255 317 315 382 471*   < > = values in this interval not displayed.   Cardiac Enzymes: No results for input(s): CKTOTAL, CKMB, CKMBINDEX, TROPONINI in the last 168 hours. BNP: BNP (last 3 results) Recent Labs    01/21/18 1323  BNP 219.2*    ProBNP (last 3 results) No results for input(s): PROBNP in the last 8760 hours.  CBG: Recent Labs  Lab 06/09/18 1243 06/09/18 1717 06/09/18 2127 06/10/18 0756 06/10/18 1226  GLUCAP 173* 162* 144* 189* 205*       Signed:  Domenic Polite MD.  [facility] 06/10/2018, 2:49 PM

## 2018-06-10 NOTE — Evaluation (Signed)
Physical Therapy Evaluation Patient Details Name: Stephanie [name] MRN: [id number] DOB: 1933/08/17 Today's Date: 06/10/2018   History of Present Illness  Pt is 84 y.o. female with medical history significant of anxiety, left THA (01/2018), type 2 diabetes, hypertension, hyperlipidemia, diverticulosis presenting to the hospital via EMS for evaluation of nausea and vomiting. CT abdomen pelvis with evidence of acute diverticulitis implicated with an abscess, CT drain placed (06/05/18)   Clinical Impression  PTA pt was modified independent with use of quad cane, completing all daily activities for herself. Her husband will be able at home 24 hours/day, and 2 of her sons are available to help as needed. Pt was sleeping at start of treatment, slow to awaken but agreeable to therapy. Pt able to complete mobility hands on min guard for safety, with minA needed once to steady walker during sit to stand. Pt has history of peripheral neuropathy, and decreased endurance indicated by limited distance with ambulation. Discussed importance of arranging home in a way for pt to take seated rest breaks as needed. Skilled therapy necessary to address deficits in strength, ROM, endurance, sensation, and balance for improved functional mobility. PT recommending DC home with HHPT to address deficits and progress HEP, as pt has needed assistance available. PT will continue to follow acutely.     Follow Up Recommendations Home health PT;Supervision/Assistance - 24 hour    Equipment Recommendations  None recommended by PT       Precautions / Restrictions Precautions Precautions: None Restrictions Weight Bearing Restrictions: No      Mobility  Transfers Overall transfer level: Needs assistance Equipment used: Rolling walker (2 wheeled) Transfers: Sit to/from Stand Sit to Stand: Min guard;Min assist         General transfer comment: hands on min guard for safety, minA once for management of RW and steadying in  stance. Pt able to complete sit to stand 3 times, with 2 attempts at powerup needed for 2 of the transfers. cuing needed for safe hand placement and scooting to edge of chair for easier powerup.  Ambulation/Gait Ambulation/Gait assistance: Min guard;+2 safety/equipment Gait Distance (Feet): 35 Feet Assistive device: Rolling walker (2 wheeled) Gait Pattern/deviations: Step-through pattern;Decreased stride length;Shuffle Gait velocity: slowed Gait velocity interpretation: <1.31 ft/sec, indicative of household ambulator General Gait Details: Pt hands on min guard for safety, +2 for chair follow as pt fatigued quickly and needed seated rest break in middle of walk. Cuing needed to keep RW close.       Balance Overall balance assessment: Needs assistance Sitting-balance support: Feet supported Sitting balance-Leahy Scale: Fair     Standing balance support: Bilateral upper extremity supported Standing balance-Leahy Scale: Fair                               Pertinent Vitals/Pain Pain Assessment: No/denies pain Pain Intervention(s): Limited activity within patient's tolerance;Monitored during session    Beatrice expects to be discharged to:: Private residence Living Arrangements: Spouse/significant other Available Help at Discharge: Family;Available 24 hours/day Type of Home: House Home Access: Stairs to enter Entrance Stairs-Rails: None(can reach door frame) Entrance Stairs-Number of Steps: 1 Home Layout: One level Home Equipment: Walker - 2 wheels;Walker - 4 wheels;Cane - quad;Cane - single point;Bedside commode Additional Comments: husband available 24 hours, 2 sons available as needed, 1 step to enter but husband says he has a ramp in place now    Prior Function Level of Independence: Independent with  assistive device(s)         Comments: ambulated with quad cane        Extremity/Trunk Assessment   Upper Extremity Assessment Upper  Extremity Assessment: Overall WFL for tasks assessed    Lower Extremity Assessment Lower Extremity Assessment: Generalized weakness;RLE deficits/detail;LLE deficits/detail RLE Deficits / Details:  MMT: grossly assessed in seated; DF: 4/5, PF 4/5, decreased sensation in feet (dorsal aspect greatest deficit) RLE Sensation: history of peripheral neuropathy LLE Deficits / Details: MMT: grossly assessed in seated; DF 4-/5, PF 4/5; decreased sensation in foot LLE Sensation: history of peripheral neuropathy    Cervical / Trunk Assessment Cervical / Trunk Assessment: Normal  Communication   Communication: No difficulties  Cognition Arousal/Alertness: Awake/alert Behavior During Therapy: WFL for tasks assessed/performed Overall Cognitive Status: Impaired/Different from baseline Area of Impairment: Attention;Following commands;Awareness                       Following Commands: Follows one step commands with increased time       General Comments: Pt resting in chair at start of treatment, was slow to become awake/alert with increased time to follow commands, improved over the course of treatment session. Pt has decreased sustained attention.       General Comments General comments (skin integrity, edema, etc.): [name] present for duration of treatment. Educated on importance of daily skin checks because of peripheral neuropathy symptoms, and continued mobility to improve strength and endurance with activity.      Exercises General Exercises - Lower Extremity Ankle Circles/Pumps: AROM;Both;5 reps;Seated   Assessment/Plan    PT Assessment Patient needs continued PT services  PT Problem List Decreased strength;Decreased range of motion;Decreased activity tolerance;Decreased balance;Decreased mobility;Decreased coordination;Decreased knowledge of use of DME;Decreased safety awareness;Impaired sensation       PT Treatment Interventions DME instruction;Gait  training;Functional mobility training;Therapeutic activities;Therapeutic exercise;Balance training;Neuromuscular re-education;Patient/family education    PT Goals (Current goals can be found in the Care Plan section)  Acute Rehab PT Goals Patient Stated Goal: get well enough to go to gym with husband PT Goal Formulation: With patient Time For Goal Achievement: 06/24/18 Potential to Achieve Goals: Good    Frequency Min 3X/week    AM-PAC PT "6 Clicks" Mobility  Outcome Measure Help needed turning from your back to your side while in a flat bed without using bedrails?: A Lot Help needed moving from lying on your back to sitting on the side of a flat bed without using bedrails?: A Lot Help needed moving to and from a bed to a chair (including a wheelchair)?: A Little Help needed standing up from a chair using your arms (e.g., wheelchair or bedside chair)?: A Little Help needed to walk in hospital room?: A Little Help needed climbing 3-5 steps with a railing? : A Lot 6 Click Score: 15    End of Session Equipment Utilized During Treatment: Gait belt Activity Tolerance: Patient tolerated treatment well Patient left: in chair;with call bell/phone within reach;with family/visitor present Nurse Communication: Mobility status PT Visit Diagnosis: Other abnormalities of gait and mobility (R26.89);Muscle weakness (generalized) (M62.81);Difficulty in walking, not elsewhere classified (R26.2)    Time: 1607-3710 PT Time Calculation (min) (ACUTE ONLY): 26 min   Charges:   PT Evaluation $PT Eval Moderate Complexity: 1 Mod PT Treatments $Gait Training: 8-22 mins        Vernell Morgans, SPT Acute Rehabilitation Services Office [phone]   Vernell Morgans 06/10/2018, 1:34 PM

## 2018-06-11 DIAGNOSIS — K5720 Diverticulitis of large intestine with perforation and abscess without bleeding: Secondary | ICD-10-CM | POA: Diagnosis not present

## 2018-06-11 DIAGNOSIS — E1151 Type 2 diabetes mellitus with diabetic peripheral angiopathy without gangrene: Secondary | ICD-10-CM | POA: Diagnosis not present

## 2018-06-11 DIAGNOSIS — Z792 Long term (current) use of antibiotics: Secondary | ICD-10-CM | POA: Diagnosis not present

## 2018-06-11 DIAGNOSIS — E1122 Type 2 diabetes mellitus with diabetic chronic kidney disease: Secondary | ICD-10-CM | POA: Diagnosis not present

## 2018-06-11 DIAGNOSIS — Z79899 Other long term (current) drug therapy: Secondary | ICD-10-CM | POA: Diagnosis not present

## 2018-06-11 DIAGNOSIS — Z7984 Long term (current) use of oral hypoglycemic drugs: Secondary | ICD-10-CM | POA: Diagnosis not present

## 2018-06-11 DIAGNOSIS — J984 Other disorders of lung: Secondary | ICD-10-CM | POA: Diagnosis not present

## 2018-06-11 DIAGNOSIS — K219 Gastro-esophageal reflux disease without esophagitis: Secondary | ICD-10-CM | POA: Diagnosis not present

## 2018-06-11 DIAGNOSIS — I129 Hypertensive chronic kidney disease with stage 1 through stage 4 chronic kidney disease, or unspecified chronic kidney disease: Secondary | ICD-10-CM | POA: Diagnosis not present

## 2018-06-11 DIAGNOSIS — D631 Anemia in chronic kidney disease: Secondary | ICD-10-CM | POA: Diagnosis not present

## 2018-06-11 DIAGNOSIS — I739 Peripheral vascular disease, unspecified: Secondary | ICD-10-CM | POA: Diagnosis not present

## 2018-06-11 DIAGNOSIS — E43 Unspecified severe protein-calorie malnutrition: Secondary | ICD-10-CM | POA: Diagnosis not present

## 2018-06-11 DIAGNOSIS — Z4682 Encounter for fitting and adjustment of non-vascular catheter: Secondary | ICD-10-CM | POA: Diagnosis not present

## 2018-06-11 DIAGNOSIS — N183 Chronic kidney disease, stage 3 (moderate): Secondary | ICD-10-CM | POA: Diagnosis not present

## 2018-06-11 LAB — AEROBIC/ANAEROBIC CULTURE W GRAM STAIN (SURGICAL/DEEP WOUND)
Gram Stain: NONE SEEN
Special Requests: NORMAL

## 2018-06-11 LAB — AEROBIC/ANAEROBIC CULTURE (SURGICAL/DEEP WOUND): CULTURE: NO GROWTH

## 2018-06-15 ENCOUNTER — Ambulatory Visit (INDEPENDENT_AMBULATORY_CARE_PROVIDER_SITE_OTHER): Payer: Self-pay | Admitting: Orthopaedic Surgery

## 2018-06-15 ENCOUNTER — Telehealth: Payer: Self-pay | Admitting: Family Medicine

## 2018-06-15 ENCOUNTER — Telehealth: Payer: Self-pay | Admitting: *Deleted

## 2018-06-15 ENCOUNTER — Other Ambulatory Visit: Payer: Self-pay | Admitting: Surgery

## 2018-06-15 DIAGNOSIS — E43 Unspecified severe protein-calorie malnutrition: Secondary | ICD-10-CM | POA: Diagnosis not present

## 2018-06-15 DIAGNOSIS — D631 Anemia in chronic kidney disease: Secondary | ICD-10-CM | POA: Diagnosis not present

## 2018-06-15 DIAGNOSIS — Z4682 Encounter for fitting and adjustment of non-vascular catheter: Secondary | ICD-10-CM | POA: Diagnosis not present

## 2018-06-15 DIAGNOSIS — Z7984 Long term (current) use of oral hypoglycemic drugs: Secondary | ICD-10-CM | POA: Diagnosis not present

## 2018-06-15 DIAGNOSIS — I129 Hypertensive chronic kidney disease with stage 1 through stage 4 chronic kidney disease, or unspecified chronic kidney disease: Secondary | ICD-10-CM | POA: Diagnosis not present

## 2018-06-15 DIAGNOSIS — Z79899 Other long term (current) drug therapy: Secondary | ICD-10-CM | POA: Diagnosis not present

## 2018-06-15 DIAGNOSIS — E1151 Type 2 diabetes mellitus with diabetic peripheral angiopathy without gangrene: Secondary | ICD-10-CM | POA: Diagnosis not present

## 2018-06-15 DIAGNOSIS — Z792 Long term (current) use of antibiotics: Secondary | ICD-10-CM | POA: Diagnosis not present

## 2018-06-15 DIAGNOSIS — K219 Gastro-esophageal reflux disease without esophagitis: Secondary | ICD-10-CM | POA: Diagnosis not present

## 2018-06-15 DIAGNOSIS — J984 Other disorders of lung: Secondary | ICD-10-CM | POA: Diagnosis not present

## 2018-06-15 DIAGNOSIS — I739 Peripheral vascular disease, unspecified: Secondary | ICD-10-CM | POA: Diagnosis not present

## 2018-06-15 DIAGNOSIS — N183 Chronic kidney disease, stage 3 (moderate): Secondary | ICD-10-CM | POA: Diagnosis not present

## 2018-06-15 DIAGNOSIS — K5720 Diverticulitis of large intestine with perforation and abscess without bleeding: Secondary | ICD-10-CM | POA: Diagnosis not present

## 2018-06-15 DIAGNOSIS — E1122 Type 2 diabetes mellitus with diabetic chronic kidney disease: Secondary | ICD-10-CM | POA: Diagnosis not present

## 2018-06-15 NOTE — Telephone Encounter (Signed)
Stacy notified of Dr. Marliss Coots comments, then I called pt and advise of Dr. Marliss Coots instructions and she verbalized understanding

## 2018-06-15 NOTE — Telephone Encounter (Signed)
Verbal order given to [facility].   Marzetta Board also forgot to let Dr. Glori Bickers know that pt's orthostatic BP is really low. Sitting it was 110/52 and standing it dropped down to 80/48, Stacy just wanted to make Dr. Glori Bickers aware and also pt is asymptomatic the only sxs pt has right now is fatigue. Also Marzetta Board said pt has good fluid intake so she doesn't think she's dehydrated. Pt already has a hospital f/u on 06/18/18 but she didn't know if Dr. Glori Bickers wants to see pt sooner given BP

## 2018-06-15 NOTE — Telephone Encounter (Signed)
Marydel, called, She saw patient at her home. She needs approval for PT 2 visits per for 2 weeks and 1 visit for 1 week.  Marzetta Board said you can leave the verbal order on her voice mail.

## 2018-06-15 NOTE — Telephone Encounter (Signed)
Transition Care Management Follow-up Telephone Call   Date discharged? 06/10/18   How have you been since you were released from the hospital? "Coming along slowly, but doing okay"   Do you understand why you were in the hospital? YES   Do you understand the discharge instructions? YES   Where were you discharged to? HOME   Items Reviewed:  Medications reviewed: YES  Allergies reviewed: YES  Dietary changes reviewed: YES  Referrals reviewed: YES   Functional Questionnaire:   Activities of Daily Living (ADLs):   She states they are independent in the following: Grooming, going to the bathroom, dressing, ambulation, bathing, feeding States they require assistance with the following: Has husband there if she needs anything   Any transportation issues/concerns?: NO   Any patient concerns? Yes about medications   Confirmed importance and date/time of follow-up visits scheduled YES  Provider Appointment booked with Dr. Glori Bickers 06/18/18 at 12:15  Confirmed with patient if condition begins to worsen call PCP or go to the ER.  Patient was given the office number and encouraged to call back with question or concerns.  : YES

## 2018-06-15 NOTE — Telephone Encounter (Signed)
Keep hydrated- and start eating when she feels able/ready  Hold the amlodipine for now (until bp starts to come up)  If symptoms like dizziness- please let me know  Thanks for the update

## 2018-06-15 NOTE — Telephone Encounter (Signed)
Patient stated that on her discharge summary her Metformin and fluid was discontinued. Patients wants to confirm that this is correct?

## 2018-06-15 NOTE — Telephone Encounter (Signed)
Please verbally ok those orders Thanks  

## 2018-06-17 ENCOUNTER — Ambulatory Visit
Admission: RE | Admit: 2018-06-17 | Discharge: 2018-06-17 | Disposition: A | Discharge status: Home / Self Care | Payer: Medicare Other | Source: Ambulatory Visit | Attending: Physician Assistant | Admitting: Physician Assistant

## 2018-06-17 ENCOUNTER — Ambulatory Visit
Admission: RE | Admit: 2018-06-17 | Discharge: 2018-06-17 | Disposition: A | Discharge status: Home / Self Care | Payer: Medicare Other | Source: Ambulatory Visit | Attending: Surgery | Admitting: Surgery

## 2018-06-17 ENCOUNTER — Telehealth: Payer: Self-pay | Admitting: Family Medicine

## 2018-06-17 ENCOUNTER — Encounter: Payer: Self-pay | Admitting: *Deleted

## 2018-06-17 ENCOUNTER — Other Ambulatory Visit: Payer: Self-pay | Admitting: Surgery

## 2018-06-17 DIAGNOSIS — K5720 Diverticulitis of large intestine with perforation and abscess without bleeding: Secondary | ICD-10-CM

## 2018-06-17 DIAGNOSIS — K5730 Diverticulosis of large intestine without perforation or abscess without bleeding: Secondary | ICD-10-CM | POA: Diagnosis not present

## 2018-06-17 HISTORY — PX: IR RADIOLOGIST EVAL & MGMT: IMG5224

## 2018-06-17 MED ORDER — IOHEXOL 300 MG/ML  SOLN
80.0000 mL | Freq: Once | INTRAMUSCULAR | Status: AC | PRN
  Administered 2018-06-17: 80 mL via INTRAVENOUS

## 2018-06-17 NOTE — Telephone Encounter (Signed)
Lauren/[facility] requesting verbal order for nursing 1 week 6 with 2 as needed. Best cb [phone].

## 2018-06-17 NOTE — Progress Notes (Signed)
Patient ID: Stephanie [name], female   DOB: 06/03/1934, 84 y.o.   MRN: [id number]       Chief Complaint: Patient was seen in consultation today for follow-up of diverticular abscess post drainage at the request of [place] [facility]  Referring Physician(s): Watterson,Shannon A  History of Present Illness: Stephanie [name] is a 84 y.o. female who presented in late November with abdominal pain, blood in stool, nausea, vomiting, diarrhea 1 week post colonoscopy.  CT on 06/04/2018 demonstrated 5.1 cm probable abscess adjacent to the descending/sigmoid colon, with colonic inflammatory changes and diverticular disease.  CT-guided drain was placed the following day.  She was ultimately discharged home.  She presents today for scheduled follow-up.  CT shows improvement of the collection with a small residual.  On discussion with the patient and her significant other, they have been diligently flushing the tube and placing it back to the suction bulb.  They have not been recording outputs.  She is feeling well.  Past Medical History:  Diagnosis Date  . Allergy    allergic rhinitis  . Anxiety   . Arthritis    osteoarthritis  . Degenerative disk disease    in neck  . Diabetes mellitus    type II  . Fracture of femoral neck, left ([place]) 01/21/2018  . Hyperlipidemia   . Hypertension   . Hypoaldosteronism ([place]) 6/12   from DM causing hyperkalemia   . Osteopenia   . Periodontal disease   . Peripheral vascular disease ([place])   . Transient cerebral ischemia 11/06/2016    Past Surgical History:  Procedure Laterality Date  . CARDIAC CATHETERIZATION     x2 STENT  . CHOLECYSTECTOMY    . ESOPHAGOGASTRODUODENOSCOPY  03/2004   gastritis by biopsy  . GUM SURGERY  06/2010   laser surgery for gums  . HIP ARTHROPLASTY Left 01/21/2018   Procedure: ARTHROPLASTY BIPOLAR HIP (HEMIARTHROPLASTY);  Surgeon: Marchia Bond, MD;  Location: [facility];  Service: Orthopedics;  Laterality: Left;  . IR RADIOLOGIST EVAL & MGMT   06/17/2018  . MM BREAST STEREO BX*L*R/S  12/12   normal  . TONSILLECTOMY    . TRANSTHORACIC ECHOCARDIOGRAM  11/2016   EF 55-60%, grade 1 DD with elevated LV end diastolic filling pressures, mildly dilated LA, mild TR and PR.   . TUBAL LIGATION      Allergies: Ace inhibitors; Amlodipine besy-benazepril hcl; Clopidogrel bisulfate; and Erythromycin  Medications: Prior to Admission medications   Medication Sig Start Date End Date Taking? Authorizing Provider  acetaminophen (TYLENOL) 325 MG tablet Take 2 tablets (650 mg total) by mouth every 6 (six) hours as needed. Patient not taking: Reported on 06/04/2018 01/21/18   Marchia Bond, MD  amLODipine (NORVASC) 5 MG tablet Take 1 tablet (5 mg total) by mouth daily. 02/16/18   Tower, Wynelle Fanny, MD  atorvastatin (LIPITOR) 20 MG tablet TAKE 1 TABLET BY MOUTH ONCE DAILY Patient taking differently: Take 20 mg by mouth daily.  04/27/18   Tower, Wynelle Fanny, MD  dipyridamole-aspirin (AGGRENOX) 200-25 MG 12hr capsule Take 1 capsule by mouth 2 (two) times daily. 06/10/18   Domenic Polite, MD  famotidine (PEPCID) 40 MG tablet Take 1 tablet (40 mg total) by mouth daily. 04/28/18   Thornton Park, MD  glipiZIDE (GLIPIZIDE XL) 10 MG 24 hr tablet TAKE 1 TABLET BY MOUTH ONCE DAILY WITH BREAKFAST Patient taking differently: Take 10 mg by mouth daily with breakfast.  02/16/18   Tower, Wynelle Fanny, MD  metFORMIN (GLUCOPHAGE) 1000 MG tablet  Take 1 tablet (1,000 mg total) by mouth 2 (two) times daily with a meal. 02/16/18   Tower, Wynelle Fanny, MD  metoprolol succinate (TOPROL-XL) 100 MG 24 hr tablet TAKE 1 TABLET BY MOUTH ONCE DAILY TAKE  WITH  OR  IMMEDIATELY  FOLLOWING  A  MEAL Patient taking differently: Take 100 mg by mouth daily.  02/16/18   Tower, Wynelle Fanny, MD  PARoxetine (PAXIL) 40 MG tablet Take 1 tablet (40 mg total) by mouth daily. 08/27/17   Tower, Wynelle Fanny, MD     Family History  Problem Relation Age of Onset  . Cancer Mother        vaginal CA in situ  . Hypertension  Mother   . Heart disease Mother        CAD and MI  . Heart disease Father        CAD  . Diabetes Father   . Heart disease Son        congenital valve problem    Social History   Socioeconomic History  . Marital status: Married    Spouse name: Not on file  . Number of children: 3  . Years of education: Not on file  . Highest education level: Not on file  Occupational History  . Not on file  Social Needs  . Financial resource strain: Not on file  . Food insecurity:    Worry: Not on file    Inability: Not on file  . Transportation needs:    Medical: Not on file    Non-medical: Not on file  Tobacco Use  . Smoking status: Never Smoker  . Smokeless tobacco: Never Used  Substance and Sexual Activity  . Alcohol use: No    Alcohol/week: 0.0 standard drinks  . Drug use: No  . Sexual activity: Not on file  Lifestyle  . Physical activity:    Days per week: Not on file    Minutes per session: Not on file  . Stress: Not on file  Relationships  . Social connections:    Talks on phone: Not on file    Gets together: Not on file    Attends religious service: Not on file    Active member of club or organization: Not on file    Attends meetings of clubs or organizations: Not on file    Relationship status: Not on file  Other Topics Concern  . Not on file  Social History Narrative  . Not on file    ECOG Status: 1 - Symptomatic but completely ambulatory   Physical Exam Constitutional: Oriented to person, place, and time. Well-developed and well-nourished. No distress.   Vital Signs: There were no vitals taken for this visit. HENT:  Head: Normocephalic and atraumatic.  Eyes: Conjunctivae and EOM are normal. Right eye exhibits no discharge. Left eye exhibits no discharge. No scleral icterus.  Neck: No JVD present.  Pulmonary/Chest: Effort normal. No stridor. No respiratory distress.  Abdomen: soft, non distended Neurological:  alert and oriented to person, place, and  time.  Skin: Skin is warm and dry.  not diaphoretic.  Transgluteal drain site clean, dry, intact Psychiatric:   normal mood and affect.   behavior is normal. Judgment and thought content normal.   Mallampati Score:  Review of Systems Review of Systems: A 12 point ROS discussed and pertinent positives are indicated in the HPI above.  All other systems are negative.      Imaging: Dg Chest 2 View  Result Date: 06/04/2018  CLINICAL DATA:  Weakness and cough x1 week, recent colonoscopy. EXAM: CHEST - 2 VIEW COMPARISON:  01/21/2018 FINDINGS: Normal heart size. Moderate aortic atherosclerosis at the arch without aneurysm. Chronic interstitial prominence is noted of the lungs without pulmonary consolidation, effusion pneumothorax. Calcified densities project over the lung bases compatible with soft tissue calcifications within the breasts on prior exam. There appears be a large eggshell calcification in the left upper quadrant of the abdomen measuring approximately 2.6 x 1.8 cm on the frontal view possibly representing a calcified splenic artery aneurysm, calcified splenic cyst or calcified diverticulum. IMPRESSION: 1. No active pulmonary disease. Aortic atherosclerosis. 2. Chronic interstitial prominence is noted of the lungs without pulmonary consolidation, effusion, pneumothorax. Electronically Signed   By: Ashley Royalty M.D.   On: 06/04/2018 19:43   Ct Abdomen Pelvis W Contrast  Result Date: 06/17/2018 CLINICAL DATA:  Diverticular abscess, status post percutaneous drainage 06/05/2018. Minimal output. EXAM: CT ABDOMEN AND PELVIS WITH CONTRAST TECHNIQUE: Multidetector CT imaging of the abdomen and pelvis was performed using the standard protocol following bolus administration of intravenous contrast. CONTRAST:  81mL OMNIPAQUE IOHEXOL 300 MG/ML  SOLN COMPARISON:  06/04/2018 FINDINGS: Lower chest: Small bilateral pleural effusions. Dependent atelectasis in the visualized lung bases. Hepatobiliary: No focal  liver abnormality is seen. Status post cholecystectomy. No biliary dilatation. Pancreas: Mild diffuse atrophy without mass or ductal dilatation. Spleen: Normal in size without focal abnormality. 2.6 cm splenic artery aneurysm with heavy peripheral calcification as before. Adrenals/Urinary Tract: Normal adrenal glands. Unremarkable kidneys. No hydronephrosis or urolithiasis evident. Urinary bladder is nondistended. Stomach/Bowel: Stomach is decompressed. Small bowel is nondilated. Normal appendix. Scattered descending and sigmoid diverticula. Stable left transgluteal pigtail drain catheter, posterior to the descending/sigmoid junction. Small residual peripherally enhancing loculated fluid collection measuring approximately 2.6 x 2.3 x 0.9 cm around the drain catheter. Vascular/Lymphatic: Mild aortoiliac partially calcified atheromatous plaque without aneurysm or evident stenosis. No abdominal or pelvic adenopathy. Portal vein patent. Reproductive: Uterus and bilateral adnexa are unremarkable, with some image degradation secondary to streak artifact from the left hip arthroplasty hardware. Other: No ascites.  No undrained fluid collections.  No free air. Musculoskeletal: Left hip arthroplasty component projects in expected location. Mild lumbar dextroscoliosis without underlying vertebral anomaly, mild multilevel spondylitic change most marked L4-5. no fracture or worrisome bone lesion. IMPRESSION: 1. Significant improvement in diverticular abscess post drainage with small residual loculated fluid around the drain catheter. 2. No new or undrained fluid collections. 3. Descending and sigmoid diverticulosis. 4. Bilateral pleural effusions 5. 2.6 cm calcified splenic artery aneurysm. Visceral aneurysms greater than 2 cm have an increased risk of rupture. Electronically Signed   By: Lucrezia Europe M.D.   On: 06/17/2018 12:54   Ct Abdomen Pelvis W Contrast  Result Date: 06/04/2018 CLINICAL DATA:  Abdominal pain, acute,  generalized. Colonoscopy 1 week ago. Dark red blood in stool. Nausea, vomiting, and diarrhea. EXAM: CT ABDOMEN AND PELVIS WITH CONTRAST TECHNIQUE: Multidetector CT imaging of the abdomen and pelvis was performed using the standard protocol following bolus administration of intravenous contrast. CONTRAST:  174mL OMNIPAQUE IOHEXOL 300 MG/ML  SOLN COMPARISON:  CT of the abdomen and pelvis 9/12/5 FINDINGS: Lower chest: Posteromedial right lower lobe airspace disease is concerning for infection or aspiration. Minimal atelectasis is present at the right base. The heart size is normal. A small hiatal hernia is present. No significant pleural or pericardial effusion is present. Hepatobiliary: No focal liver abnormality is seen. Status post cholecystectomy. No biliary dilatation. Pancreas: Unremarkable. No pancreatic  ductal dilatation or surrounding inflammatory changes. Spleen: Normal in size without focal abnormality. Adrenals/Urinary Tract: Adrenal glands are normal bilaterally. Kidneys and ureters are within normal limits. There is no stone or mass lesion. No obstruction is present. The urinary bladder is within normal limits. Stomach/Bowel: The stomach is otherwise normal. Duodenum is within normal limits as well. Small bowel is unremarkable. Terminal ileum is within normal limits. Appendix is visualized and normal. The ascending and transverse colon are within normal limits. Descending colon is unremarkable. Diverticular changes are present distally. Diverticular changes extend into the sigmoid colon. There is focal inflammation the distal sigmoid colon, above the rectum. Adjacent peripherally enhancing fluid collection in the left anatomic pelvis measures 5.1 x 2.2 x 5.9 cm. Vascular/Lymphatic: No significant adenopathy is present. Atherosclerotic calcifications are present in the aorta without aneurysm. Reproductive: Status post hysterectomy. No adnexal masses. Other: The a small amount of free fluid is present in  the anatomic pelvis separate from the peripherally enhancing collection. There is no free air. No ventral hernia is present. Musculoskeletal: Left total hip arthroplasty is present. Bony pelvis is otherwise unremarkable. Degenerative grade 1 anterolisthesis is present at L4-5 with uncovering of a broad-based disc protrusion leading to central and bilateral foraminal narrowing. Central and bilateral foraminal narrowing is also present at L5-S1. IMPRESSION: 1. Sigmoid diverticular disease with inflammatory changes and pericolonic fluid collection likely reflecting acute diverticulitis complicated with abscess measuring 5.1 x 2.2 x 5.9 cm. 2. Additional free fluid in the anatomic pelvis is likely reactive. 3. No free air to suggest perforation. 4. Right lower lobe airspace disease concerning for pneumonia or aspiration. 5. Degenerative changes in the lower lumbar spine. These results were called by telephone at the time of interpretation on 06/04/2018 at 8:56 pm to Dr. Veryl Speak , who verbally acknowledged these results. Electronically Signed   By: San Morelle M.D.   On: 06/04/2018 20:56   Ct Image Guided Drainage By Percutaneous Catheter  Result Date: 06/05/2018 INDICATION: Left pelvic abscess concern for diverticulitis EXAM: CT guided left trans gluteal pelvic fluid collection drain MEDICATIONS: The patient is currently admitted to the hospital and receiving intravenous antibiotics. The antibiotics were administered within an appropriate time frame prior to the initiation of the procedure. ANESTHESIA/SEDATION: Fentanyl 50 mcg IV; Versed 1.0 mg IV Moderate Sedation Time:  13 minutes The patient was continuously monitored during the procedure by the interventional radiology nurse under my direct supervision. COMPLICATIONS: None immediate. PROCEDURE: Informed written consent was obtained from the patient after a thorough discussion of the procedural risks, benefits and alternatives. All questions were  addressed. Maximal Sterile Barrier Technique was utilized including caps, mask, sterile gowns, sterile gloves, sterile drape, hand hygiene and skin antiseptic. A timeout was performed prior to the initiation of the procedure. No previous imaging reviewed. Patient position prone. Noncontrast localization CT performed. The posterior left pelvic fluid collection was localized and marked for a left trans gluteal posterior approach. Under sterile conditions and local anesthesia, a 18 gauge 10 cm access needle was advanced from a left posterior trans gluteal approach into the fluid collection. Needle position confirmed with CT. Syringe aspiration yielded dark thin fluid suspicious for chronic hematoma. Sample sent for culture. Guidewire inserted followed by tract dilatation to insert a 10 [place] drain. Drain catheter position confirmed with CT. Catheter secured with Prolene suture and connected to external suction bulb. Sterile dressing applied. No immediate complication. Patient tolerated the procedure well. IMPRESSION: Successful CT-guided left pelvic fluid collection drain placement as above.  Electronically Signed   By: Jerilynn Mages.  Shick M.D.   On: 06/05/2018 16:09   Ir Radiologist Eval & Mgmt  Result Date: 06/17/2018 Please refer to notes tab for details about interventional procedure. (Op Note)  CT ABDOMEN AND PELVIS WITH CONTRAST  TECHNIQUE: Multidetector CT imaging of the abdomen and pelvis was performed using the standard protocol following bolus administration of intravenous contrast.  CONTRAST: 52mL OMNIPAQUE IOHEXOL 300 MG/ML SOLN  COMPARISON: 06/04/2018  FINDINGS: Lower chest: Small bilateral pleural effusions. Dependent atelectasis in the visualized lung bases.  Hepatobiliary: No focal liver abnormality is seen. Status post cholecystectomy. No biliary dilatation.  Pancreas: Mild diffuse atrophy without mass or ductal dilatation.  Spleen: Normal in size without focal abnormality. 2.6 cm  splenic artery aneurysm with heavy peripheral calcification as before.  Adrenals/Urinary Tract: Normal adrenal glands. Unremarkable kidneys. No hydronephrosis or urolithiasis evident. Urinary bladder is nondistended.  Stomach/Bowel: Stomach is decompressed. Small bowel is nondilated. Normal appendix. Scattered descending and sigmoid diverticula.  Stable left transgluteal pigtail drain catheter, posterior to the descending/sigmoid junction. Small residual peripherally enhancing loculated fluid collection measuring approximately 2.6 x 2.3 x 0.9 cm around the drain catheter.  Vascular/Lymphatic: Mild aortoiliac partially calcified atheromatous plaque without aneurysm or evident stenosis. No abdominal or pelvic adenopathy. Portal vein patent.  Reproductive: Uterus and bilateral adnexa are unremarkable, with some image degradation secondary to streak artifact from the left hip arthroplasty hardware.  Other: No ascites. No undrained fluid collections. No free air.  Musculoskeletal: Left hip arthroplasty component projects in expected location. Mild lumbar dextroscoliosis without underlying vertebral anomaly, mild multilevel spondylitic change most marked L4-5. no fracture or worrisome bone lesion.  IMPRESSION: 1. Significant improvement in diverticular abscess post drainage with small residual loculated fluid around the drain catheter. 2. No new or undrained fluid collections. 3. Descending and sigmoid diverticulosis. 4. Bilateral pleural effusions 5. 2.6 cm calcified splenic artery aneurysm. Visceral aneurysms greater than 2 cm have an increased risk of rupture.   Electronically Signed By: Lucrezia Europe M.D. On: 06/17/2018 12:54  Labs:  CBC: Recent Labs    06/07/18 0244 06/08/18 0707 06/09/18 0201 06/10/18 0537  WBC 9.9 10.5 11.9* 12.2*  HGB 9.0* 8.8* 9.2* 9.6*  HCT 27.8* 25.8* 28.2* 28.7*  PLT 317 315 382 471*    COAGS: Recent Labs    01/21/18 1323 06/05/18 1400    INR 1.09 1.15    BMP: Recent Labs    06/07/18 0244 06/08/18 0707 06/09/18 0201 06/10/18 0537  NA 131* 132* 132* 130*  K 3.1* 3.3* 5.1 4.1  CL 102 106 105 99  CO2 19* 19* 18* 20*  GLUCOSE 156* 190* 211* 185*  BUN 15 7* 6* 7*  CALCIUM 8.3* 7.9* 8.5* 8.7*  CREATININE 1.03* 0.98 0.94 0.93  GFRNONAA 49* 52* 55* 57*  GFRAA 57* >60 >60 >60    LIVER FUNCTION TESTS: Recent Labs    08/27/17 1251 02/09/18 0856 06/04/18 1819  BILITOT 0.4 0.5 0.6  AST 14 14 36  ALT 6 7 10   ALKPHOS 46 89 51  PROT 7.2 7.3 6.0*  ALBUMIN 4.3 3.9 2.6*    TUMOR MARKERS: No results for input(s): AFPTM, CEA, CA199, CHROMGRNA in the last 8760 hours.  Assessment and Plan:  My impression is that she has made significant progress with resolution of her diverticular abscess post drainage.  There is only a small residual on CT.  Therefore, the catheter will need to stay in place.  I did not do a drain injection  today as it not would not change the plan. We will change the drain from suction bulb to gravity bag.  We will change her flushing regimen to 3-5 mL twice daily.  I went over all this with the patient and her significant other and demonstrated how to flush with the 10 mL sterile saline syringe.  I gave him a new drain card to record output daily or as needed, with units. We will plan to see her back in about 2 weeks, for repeat CT pelvis.  I think there is a good chance this will be completely healed by then.  She will probably need an injection of contrast under fluoroscopy into the drain catheter to exclude fistula to the sigmoid colon before drain removal. The patient and her significant other seem to understand, did ask appropriate questions which were answered, and are agreeable with this plan.  Thank you for this interesting consult.  I greatly enjoyed meeting NEVELYN MELLOTT and look forward to participating in their care.  A copy of this report was sent to the requesting provider on this  date.  Electronically Signed: Rickard Rhymes 06/17/2018, 4:32 PM   I spent a total of    25 Minutes in face to face in clinical consultation, greater than 50% of which was counseling/coordinating care for pelvic abscess drain catheter.

## 2018-06-17 NOTE — Telephone Encounter (Signed)
Please ok those verbal orders  

## 2018-06-17 NOTE — Telephone Encounter (Signed)
Verbal order given  

## 2018-06-17 NOTE — Telephone Encounter (Signed)
Please let her know she can start back on the metformin as long as she is eating  Thanks

## 2018-06-17 NOTE — Telephone Encounter (Signed)
Pt notified of Dr. Tower's comments and verbalized understanding  

## 2018-06-18 ENCOUNTER — Ambulatory Visit (INDEPENDENT_AMBULATORY_CARE_PROVIDER_SITE_OTHER): Payer: Medicare Other | Admitting: Family Medicine

## 2018-06-18 ENCOUNTER — Encounter: Payer: Self-pay | Admitting: Family Medicine

## 2018-06-18 VITALS — BP 120/60 | HR 70 | Temp 98.3°F | Ht 61.0 in | Wt 104.2 lb

## 2018-06-18 DIAGNOSIS — E11319 Type 2 diabetes mellitus with unspecified diabetic retinopathy without macular edema: Secondary | ICD-10-CM | POA: Diagnosis not present

## 2018-06-18 DIAGNOSIS — J984 Other disorders of lung: Secondary | ICD-10-CM | POA: Diagnosis not present

## 2018-06-18 DIAGNOSIS — E43 Unspecified severe protein-calorie malnutrition: Secondary | ICD-10-CM | POA: Diagnosis not present

## 2018-06-18 DIAGNOSIS — E1169 Type 2 diabetes mellitus with other specified complication: Secondary | ICD-10-CM

## 2018-06-18 DIAGNOSIS — Z792 Long term (current) use of antibiotics: Secondary | ICD-10-CM

## 2018-06-18 DIAGNOSIS — Z79899 Other long term (current) drug therapy: Secondary | ICD-10-CM

## 2018-06-18 DIAGNOSIS — R195 Other fecal abnormalities: Secondary | ICD-10-CM

## 2018-06-18 DIAGNOSIS — I10 Essential (primary) hypertension: Secondary | ICD-10-CM | POA: Diagnosis not present

## 2018-06-18 DIAGNOSIS — F329 Major depressive disorder, single episode, unspecified: Secondary | ICD-10-CM

## 2018-06-18 DIAGNOSIS — I129 Hypertensive chronic kidney disease with stage 1 through stage 4 chronic kidney disease, or unspecified chronic kidney disease: Secondary | ICD-10-CM | POA: Diagnosis not present

## 2018-06-18 DIAGNOSIS — K219 Gastro-esophageal reflux disease without esophagitis: Secondary | ICD-10-CM

## 2018-06-18 DIAGNOSIS — N182 Chronic kidney disease, stage 2 (mild): Secondary | ICD-10-CM | POA: Diagnosis not present

## 2018-06-18 DIAGNOSIS — I739 Peripheral vascular disease, unspecified: Secondary | ICD-10-CM | POA: Diagnosis not present

## 2018-06-18 DIAGNOSIS — Z4682 Encounter for fitting and adjustment of non-vascular catheter: Secondary | ICD-10-CM

## 2018-06-18 DIAGNOSIS — D649 Anemia, unspecified: Secondary | ICD-10-CM

## 2018-06-18 DIAGNOSIS — N183 Chronic kidney disease, stage 3 (moderate): Secondary | ICD-10-CM | POA: Diagnosis not present

## 2018-06-18 DIAGNOSIS — K5720 Diverticulitis of large intestine with perforation and abscess without bleeding: Secondary | ICD-10-CM

## 2018-06-18 DIAGNOSIS — E782 Mixed hyperlipidemia: Secondary | ICD-10-CM

## 2018-06-18 DIAGNOSIS — D631 Anemia in chronic kidney disease: Secondary | ICD-10-CM

## 2018-06-18 DIAGNOSIS — Z7984 Long term (current) use of oral hypoglycemic drugs: Secondary | ICD-10-CM

## 2018-06-18 DIAGNOSIS — F419 Anxiety disorder, unspecified: Secondary | ICD-10-CM

## 2018-06-18 DIAGNOSIS — E1151 Type 2 diabetes mellitus with diabetic peripheral angiopathy without gangrene: Secondary | ICD-10-CM | POA: Diagnosis not present

## 2018-06-18 DIAGNOSIS — E1122 Type 2 diabetes mellitus with diabetic chronic kidney disease: Secondary | ICD-10-CM

## 2018-06-18 LAB — RENAL FUNCTION PANEL
Albumin: 3.2 g/dL — ABNORMAL LOW (ref 3.5–5.2)
BUN: 19 mg/dL (ref 6–23)
CO2: 27 mEq/L (ref 19–32)
Calcium: 8.8 mg/dL (ref 8.4–10.5)
Chloride: 99 mEq/L (ref 96–112)
Creatinine, Ser: 1 mg/dL (ref 0.40–1.20)
GFR: 56.37 mL/min — ABNORMAL LOW (ref >60.00)
Glucose, Bld: 178 mg/dL — ABNORMAL HIGH (ref 70–99)
Phosphorus: 3.5 mg/dL (ref 2.3–4.6)
Potassium: 3.6 mEq/L (ref 3.5–5.1)
Sodium: 134 mEq/L — ABNORMAL LOW (ref 135–145)

## 2018-06-18 LAB — CBC WITH DIFFERENTIAL/PLATELET
Basophils Absolute: 0.1 10*3/uL (ref 0.0–0.1)
Basophils Relative: 0.7 % (ref 0.0–3.0)
Eosinophils Absolute: 0.1 10*3/uL (ref 0.0–0.7)
Eosinophils Relative: 1.1 % (ref 0.0–5.0)
HCT: 29.9 % — ABNORMAL LOW (ref 36.0–46.0)
HEMOGLOBIN: 10.1 g/dL — AB (ref 12.0–15.0)
Lymphocytes Relative: 29.8 % (ref 12.0–46.0)
Lymphs Abs: 2.7 10*3/uL (ref 0.7–4.0)
MCHC: 33.7 g/dL (ref 30.0–36.0)
MCV: 91.4 fl (ref 78.0–100.0)
Monocytes Absolute: 0.9 10*3/uL (ref 0.1–1.0)
Monocytes Relative: 9.6 % (ref 3.0–12.0)
Neutro Abs: 5.4 10*3/uL (ref 1.4–7.7)
Neutrophils Relative %: 58.8 % (ref 43.0–77.0)
Platelets: 498 10*3/uL — ABNORMAL HIGH (ref 150.0–400.0)
RBC: 3.27 Mil/uL — ABNORMAL LOW (ref 3.87–5.11)
RDW: 13.8 % (ref 11.5–15.5)
WBC: 9.2 10*3/uL (ref 4.0–10.5)

## 2018-06-18 NOTE — Progress Notes (Signed)
Subjective:    Patient ID: Stephanie [name], female    DOB: 02-Mar-1934, 84 y.o.   MRN: [id number]  HPI Here for f/u for hospitalization from 11/21 to 06/10/18 for colonic diverticular ascess with sepsis  She presented via EMS with nausea and vomiting  CT of abdomen showed evid of acute diverticulitis and abscess as well as abd free fluid (but no free air)  Also RLL airspace dz in chest -concerning for pneumonia or aspiration   Hospital course as noted:  Hospital Course:   Acute diverticulitis with abscess formation -Started broad-spectrum antibiotics, general surgery was consulted -s/p IR guided perc drainage of the abscess on 06/05/2018. Cultures negative.  -Was on IV Zosyn for 4 days, transitioned to oral Augmentin,plan for total 10 course of antibiotics -then developed diarrhea, likely induced by antibiotics/underlying infection, Cdiff negative, diarrhea improving now -tolerating diet -stable for DC home with general surgery, IR drain clinic and PCP FU -discharged home with drain and home health services   Hypokalemia -Replaced  Anemia of chronic disease -stable  Questionable pneumonia on CT abdomen and pelvis: -Clinically doubt this, on antibiotics regardless which would cover pulmonary source as well  Dg Chest 2 View  Result Date: 06/04/2018 CLINICAL DATA:  Weakness and cough x1 week, recent colonoscopy. EXAM: CHEST - 2 VIEW COMPARISON:  01/21/2018 FINDINGS: Normal heart size. Moderate aortic atherosclerosis at the arch without aneurysm. Chronic interstitial prominence is noted of the lungs without pulmonary consolidation, effusion pneumothorax. Calcified densities project over the lung bases compatible with soft tissue calcifications within the breasts on prior exam. There appears be a large eggshell calcification in the left upper quadrant of the abdomen measuring approximately 2.6 x 1.8 cm on the frontal view possibly representing a calcified splenic artery aneurysm,  calcified splenic cyst or calcified diverticulum. IMPRESSION: 1. No active pulmonary disease. Aortic atherosclerosis. 2. Chronic interstitial prominence is noted of the lungs without pulmonary consolidation, effusion, pneumothorax. Electronically Signed   By: Ashley Royalty M.D.   On: 06/04/2018 19:43   Ct Abdomen Pelvis W Contrast  Result Date: 06/17/2018 CLINICAL DATA:  Diverticular abscess, status post percutaneous drainage 06/05/2018. Minimal output. EXAM: CT ABDOMEN AND PELVIS WITH CONTRAST TECHNIQUE: Multidetector CT imaging of the abdomen and pelvis was performed using the standard protocol following bolus administration of intravenous contrast. CONTRAST:  26mL OMNIPAQUE IOHEXOL 300 MG/ML  SOLN COMPARISON:  06/04/2018 FINDINGS: Lower chest: Small bilateral pleural effusions. Dependent atelectasis in the visualized lung bases. Hepatobiliary: No focal liver abnormality is seen. Status post cholecystectomy. No biliary dilatation. Pancreas: Mild diffuse atrophy without mass or ductal dilatation. Spleen: Normal in size without focal abnormality. 2.6 cm splenic artery aneurysm with heavy peripheral calcification as before. Adrenals/Urinary Tract: Normal adrenal glands. Unremarkable kidneys. No hydronephrosis or urolithiasis evident. Urinary bladder is nondistended. Stomach/Bowel: Stomach is decompressed. Small bowel is nondilated. Normal appendix. Scattered descending and sigmoid diverticula. Stable left transgluteal pigtail drain catheter, posterior to the descending/sigmoid junction. Small residual peripherally enhancing loculated fluid collection measuring approximately 2.6 x 2.3 x 0.9 cm around the drain catheter. Vascular/Lymphatic: Mild aortoiliac partially calcified atheromatous plaque without aneurysm or evident stenosis. No abdominal or pelvic adenopathy. Portal vein patent. Reproductive: Uterus and bilateral adnexa are unremarkable, with some image degradation secondary to streak artifact from the left  hip arthroplasty hardware. Other: No ascites.  No undrained fluid collections.  No free air. Musculoskeletal: Left hip arthroplasty component projects in expected location. Mild lumbar dextroscoliosis without underlying vertebral anomaly, mild multilevel spondylitic change most marked  L4-5. no fracture or worrisome bone lesion. IMPRESSION: 1. Significant improvement in diverticular abscess post drainage with small residual loculated fluid around the drain catheter. 2. No new or undrained fluid collections. 3. Descending and sigmoid diverticulosis. 4. Bilateral pleural effusions 5. 2.6 cm calcified splenic artery aneurysm. Visceral aneurysms greater than 2 cm have an increased risk of rupture. Electronically Signed   By: Lucrezia Europe M.D.   On: 06/17/2018 12:54   Ct Abdomen Pelvis W Contrast  Result Date: 06/04/2018 CLINICAL DATA:  Abdominal pain, acute, generalized. Colonoscopy 1 week ago. Dark red blood in stool. Nausea, vomiting, and diarrhea. EXAM: CT ABDOMEN AND PELVIS WITH CONTRAST TECHNIQUE: Multidetector CT imaging of the abdomen and pelvis was performed using the standard protocol following bolus administration of intravenous contrast. CONTRAST:  164mL OMNIPAQUE IOHEXOL 300 MG/ML  SOLN COMPARISON:  CT of the abdomen and pelvis 9/12/5 FINDINGS: Lower chest: Posteromedial right lower lobe airspace disease is concerning for infection or aspiration. Minimal atelectasis is present at the right base. The heart size is normal. A small hiatal hernia is present. No significant pleural or pericardial effusion is present. Hepatobiliary: No focal liver abnormality is seen. Status post cholecystectomy. No biliary dilatation. Pancreas: Unremarkable. No pancreatic ductal dilatation or surrounding inflammatory changes. Spleen: Normal in size without focal abnormality. Adrenals/Urinary Tract: Adrenal glands are normal bilaterally. Kidneys and ureters are within normal limits. There is no stone or mass lesion. No  obstruction is present. The urinary bladder is within normal limits. Stomach/Bowel: The stomach is otherwise normal. Duodenum is within normal limits as well. Small bowel is unremarkable. Terminal ileum is within normal limits. Appendix is visualized and normal. The ascending and transverse colon are within normal limits. Descending colon is unremarkable. Diverticular changes are present distally. Diverticular changes extend into the sigmoid colon. There is focal inflammation the distal sigmoid colon, above the rectum. Adjacent peripherally enhancing fluid collection in the left anatomic pelvis measures 5.1 x 2.2 x 5.9 cm. Vascular/Lymphatic: No significant adenopathy is present. Atherosclerotic calcifications are present in the aorta without aneurysm. Reproductive: Status post hysterectomy. No adnexal masses. Other: The a small amount of free fluid is present in the anatomic pelvis separate from the peripherally enhancing collection. There is no free air. No ventral hernia is present. Musculoskeletal: Left total hip arthroplasty is present. Bony pelvis is otherwise unremarkable. Degenerative grade 1 anterolisthesis is present at L4-5 with uncovering of a broad-based disc protrusion leading to central and bilateral foraminal narrowing. Central and bilateral foraminal narrowing is also present at L5-S1. IMPRESSION: 1. Sigmoid diverticular disease with inflammatory changes and pericolonic fluid collection likely reflecting acute diverticulitis complicated with abscess measuring 5.1 x 2.2 x 5.9 cm. 2. Additional free fluid in the anatomic pelvis is likely reactive. 3. No free air to suggest perforation. 4. Right lower lobe airspace disease concerning for pneumonia or aspiration. 5. Degenerative changes in the lower lumbar spine. These results were called by telephone at the time of interpretation on 06/04/2018 at 8:56 pm to Dr. Veryl Speak , who verbally acknowledged these results. Electronically Signed   By:  San Morelle M.D.   On: 06/04/2018 20:56   Ct Image Guided Drainage By Percutaneous Catheter  Result Date: 06/05/2018 INDICATION: Left pelvic abscess concern for diverticulitis EXAM: CT guided left trans gluteal pelvic fluid collection drain MEDICATIONS: The patient is currently admitted to the hospital and receiving intravenous antibiotics. The antibiotics were administered within an appropriate time frame prior to the initiation of the procedure. ANESTHESIA/SEDATION: Fentanyl 50 mcg  IV; Versed 1.0 mg IV Moderate Sedation Time:  13 minutes The patient was continuously monitored during the procedure by the interventional radiology nurse under my direct supervision. COMPLICATIONS: None immediate. PROCEDURE: Informed written consent was obtained from the patient after a thorough discussion of the procedural risks, benefits and alternatives. All questions were addressed. Maximal Sterile Barrier Technique was utilized including caps, mask, sterile gowns, sterile gloves, sterile drape, hand hygiene and skin antiseptic. A timeout was performed prior to the initiation of the procedure. No previous imaging reviewed. Patient position prone. Noncontrast localization CT performed. The posterior left pelvic fluid collection was localized and marked for a left trans gluteal posterior approach. Under sterile conditions and local anesthesia, a 18 gauge 10 cm access needle was advanced from a left posterior trans gluteal approach into the fluid collection. Needle position confirmed with CT. Syringe aspiration yielded dark thin fluid suspicious for chronic hematoma. Sample sent for culture. Guidewire inserted followed by tract dilatation to insert a 10 [place] drain. Drain catheter position confirmed with CT. Catheter secured with Prolene suture and connected to external suction bulb. Sterile dressing applied. No immediate complication. Patient tolerated the procedure well. IMPRESSION: Successful CT-guided left pelvic  fluid collection drain placement as above. Electronically Signed   By: Jerilynn Mages.  Shick M.D.   On: 06/05/2018 16:09   Ir Radiologist Eval & Mgmt  Result Date: 06/17/2018 Please refer to notes tab for details about interventional procedure. (Op Note)    Covered with SS insulin in hospital for DM AKI noted at admit improved (in setting of CKD3) She has not checked blood sugar since coming home   Lab Results  Component Value Date   CREATININE 0.93 06/10/2018   BUN 7 (L) 06/10/2018   NA 130 (L) 06/10/2018   K 4.1 06/10/2018   CL 99 06/10/2018   CO2 20 (L) 06/10/2018   Lab Results  Component Value Date   ALT 10 06/04/2018   AST 36 06/04/2018   ALKPHOS 51 06/04/2018   BILITOT 0.6 06/04/2018    Lab Results  Component Value Date   WBC 12.2 (H) 06/10/2018   HGB 9.6 (L) 06/10/2018   HCT 28.7 (L) 06/10/2018   MCV 91.4 06/10/2018   PLT 471 (H) 06/10/2018  wbc was 14.2 at presentation   cultures from abscess neg  c diff neg   Wt Readings from Last 3 Encounters:  06/18/18 104 lb 4 oz (47.3 kg)  06/04/18 108 lb (49 kg)  05/13/18 111 lb (50.3 kg)  not eating much but she is eating (ate egg/bacon biscuit this am)  Getting appetite back  19.70 kg/m   BP Readings from Last 3 Encounters:  06/18/18 120/60  06/10/18 129/76  05/13/18 (!) 160/64   Pulse Readings from Last 3 Encounters:  06/18/18 70  06/10/18 76  05/13/18 84   HH is visiting (today and tomorrow)  We were called re: low bp at d/c and told to hold amlodipine for now   Tired and sleepy  Diarrhea is much improved   No abd pain  Finished abx  Just sore from her surgery  Changed from bulb to bag in drain - she will have it out in 2 weeks  No n/v at all   Patient Active Problem List   Diagnosis Date Noted  . Colonic diverticular abscess 06/05/2018  . Anemia 06/05/2018  . Protein calorie malnutrition ([place]) 06/05/2018  . Diverticulitis 06/04/2018  . Fecal occult blood test positive 03/12/2018  . Closed fracture of  left  hip ([place])   . Chronic kidney disease (CKD), stage II (mild) 01/22/2018  . Fracture of femoral neck, left ([place]) 01/21/2018  . Fall 01/21/2018  . Depression 01/21/2018  . HLD (hyperlipidemia) 01/21/2018  . GERD (gastroesophageal reflux disease) 01/21/2018  . CAD (coronary artery disease) 01/21/2018  . Subcapital fracture of left hip ([place]) 01/21/2018  . Carotid stenosis, bilateral 11/20/2016  . Transient cerebral ischemia 11/06/2016  . Vitamin D deficiency 09/04/2015  . Routine general medical examination at a health care facility 09/04/2015  . Encounter for Medicare annual wellness exam 05/30/2014  . Colon cancer screening 05/30/2014  . History of fall 05/26/2013  . Gynecological examination 02/20/2011  . HELICOBACTER PYLORI INFECTION 12/19/2006  . HSV 12/19/2006  . Type 2 diabetes mellitus ([place]) 12/19/2006  . Diabetic retinopathy ([place]) 12/19/2006  . Combined hyperlipidemia associated with type 2 diabetes mellitus ([place]) 12/19/2006  . ANXIETY 12/19/2006  . HTN (hypertension) 12/19/2006  . Coronary atherosclerosis 12/19/2006  . Peripheral vascular disease due to secondary diabetes mellitus ([place]) 12/19/2006  . ALLERGIC RHINITIS 12/19/2006  . OSTEOARTHRITIS 12/19/2006  . Osteopenia 12/19/2006   Past Medical History:  Diagnosis Date  . Allergy    allergic rhinitis  . Anxiety   . Arthritis    osteoarthritis  . Degenerative disk disease    in neck  . Diabetes mellitus    type II  . Fracture of femoral neck, left ([place]) 01/21/2018  . Hyperlipidemia   . Hypertension   . Hypoaldosteronism ([place]) 6/12   from DM causing hyperkalemia   . Osteopenia   . Periodontal disease   . Peripheral vascular disease ([place])   . Transient cerebral ischemia 11/06/2016   Past Surgical History:  Procedure Laterality Date  . CARDIAC CATHETERIZATION     x2 STENT  . CHOLECYSTECTOMY    . ESOPHAGOGASTRODUODENOSCOPY  03/2004   gastritis by biopsy  . GUM SURGERY  06/2010   laser surgery for gums    . HIP ARTHROPLASTY Left 01/21/2018   Procedure: ARTHROPLASTY BIPOLAR HIP (HEMIARTHROPLASTY);  Surgeon: Marchia Bond, MD;  Location: [facility];  Service: Orthopedics;  Laterality: Left;  . IR RADIOLOGIST EVAL & MGMT  06/17/2018  . MM BREAST STEREO BX*L*R/S  12/12   normal  . TONSILLECTOMY    . TRANSTHORACIC ECHOCARDIOGRAM  11/2016   EF 55-60%, grade 1 DD with elevated LV end diastolic filling pressures, mildly dilated LA, mild TR and PR.   . TUBAL LIGATION     Social History   Tobacco Use  . Smoking status: Never Smoker  . Smokeless tobacco: Never Used  Substance Use Topics  . Alcohol use: No    Alcohol/week: 0.0 standard drinks  . Drug use: No   Family History  Problem Relation Age of Onset  . Cancer Mother        vaginal CA in situ  . Hypertension Mother   . Heart disease Mother        CAD and MI  . Heart disease Father        CAD  . Diabetes Father   . Heart disease Son        congenital valve problem   Allergies  Allergen Reactions  . Ace Inhibitors Other (See Comments)    REACTION: generic, reaction not known  . Amlodipine Besy-Benazepril Hcl Other (See Comments)    REACTION: chest pain  . Clopidogrel Bisulfate Other (See Comments)    REACTION: GI  . Erythromycin Other (See Comments)    REACTION: GI upset  Current Outpatient Medications on File Prior to Visit  Medication Sig Dispense Refill  . acetaminophen (TYLENOL) 325 MG tablet Take 2 tablets (650 mg total) by mouth every 6 (six) hours as needed. 60 tablet 1  . amLODipine (NORVASC) 5 MG tablet Take 1 tablet (5 mg total) by mouth daily. 90 tablet 3  . atorvastatin (LIPITOR) 20 MG tablet TAKE 1 TABLET BY MOUTH ONCE DAILY (Patient taking differently: Take 20 mg by mouth daily. ) 90 tablet 1  . dipyridamole-aspirin (AGGRENOX) 200-25 MG 12hr capsule Take 1 capsule by mouth 2 (two) times daily.    . famotidine (PEPCID) 40 MG tablet Take 1 tablet (40 mg total) by mouth daily. 30 tablet 3  . glipiZIDE (GLIPIZIDE XL)  10 MG 24 hr tablet TAKE 1 TABLET BY MOUTH ONCE DAILY WITH BREAKFAST (Patient taking differently: Take 10 mg by mouth daily with breakfast. ) 90 tablet 3  . metFORMIN (GLUCOPHAGE) 1000 MG tablet Take 1 tablet (1,000 mg total) by mouth 2 (two) times daily with a meal. 180 tablet 3  . metoprolol succinate (TOPROL-XL) 100 MG 24 hr tablet TAKE 1 TABLET BY MOUTH ONCE DAILY TAKE  WITH  OR  IMMEDIATELY  FOLLOWING  A  MEAL (Patient taking differently: Take 100 mg by mouth daily. ) 90 tablet 3  . PARoxetine (PAXIL) 40 MG tablet Take 1 tablet (40 mg total) by mouth daily. 90 tablet 3   No current facility-administered medications on file prior to visit.     Review of Systems  Constitutional: Positive for fatigue. Negative for activity change, appetite change, fever and unexpected weight change.  HENT: Negative for congestion, ear pain, rhinorrhea, sinus pressure and sore throat.   Eyes: Negative for pain, redness and visual disturbance.  Respiratory: Negative for cough, shortness of breath and wheezing.   Cardiovascular: Negative for chest pain and palpitations.  Gastrointestinal: Negative for abdominal distention, abdominal pain, anal bleeding, blood in stool, constipation, diarrhea and nausea.  Endocrine: Negative for polydipsia and polyuria.  Genitourinary: Negative for dysuria, frequency and urgency.  Musculoskeletal: Negative for arthralgias, back pain and myalgias.  Skin: Negative for pallor and rash.  Allergic/Immunologic: Negative for environmental allergies.  Neurological: Negative for dizziness, syncope and headaches.       Generalized weakness (not focal)   Hematological: Negative for adenopathy. Does not bruise/bleed easily.  Psychiatric/Behavioral: Negative for decreased concentration and dysphoric mood. The patient is not nervous/anxious.        Objective:   Physical Exam  Constitutional: She appears well-developed and well-nourished. No distress.  Frail appearing elderly female   HENT:  Head: Normocephalic and atraumatic.  Mouth/Throat: Oropharynx is clear and moist.  Eyes: Pupils are equal, round, and reactive to light. Conjunctivae and EOM are normal. No scleral icterus.  Neck: Normal range of motion. Neck supple.  Cardiovascular: Normal rate, regular rhythm and normal heart sounds.  Pulmonary/Chest: Effort normal and breath sounds normal. No stridor. No respiratory distress. She has no wheezes. She has no rales.  Abdominal: Soft. Bowel sounds are normal. She exhibits no distension and no mass. There is tenderness. There is no rebound and no guarding. No hernia.  Mild tenderness only around drain site (which looks good)  No redness or swelling of skin   Lymphadenopathy:    She has no cervical adenopathy.  Neurological: She is alert. She displays normal reflexes. No cranial nerve deficit. Coordination normal.  Slow gait with walker  Skin: Skin is warm and dry. Capillary refill takes less than 2  seconds. No erythema. No pallor.  Fair complexion  Psychiatric: She has a normal mood and affect.  Pleasant           Assessment & Plan:   Problem List Items Addressed This Visit      Cardiovascular and Mediastinum   HTN (hypertension)    bp in fair control at this time  BP Readings from Last 1 Encounters:  06/18/18 120/60   No changes needed  (continues to hold amlodipine from hospital dc since bp was low) Expect to add this back as bp rises (will watch) Most recent labs reviewed  Disc lifstyle change with low sodium diet and exercise        Relevant Orders   CBC with Differential/Platelet (Completed)   Renal function panel (Completed)     Digestive   Colonic diverticular abscess - Primary    Reviewed hospital records, lab results and studies in detail   S/p surgery and drain placement Doing well  Finished abx (augmentin)  Diarrhea is resolved as well  Drain in place and working well  For gen surg f/u and drain removal in about 2 weeks   Continue HH for deconditioning/weakness and also surgical wound care      Relevant Orders   CBC with Differential/Platelet (Completed)     Endocrine   Type 2 diabetes mellitus ([place])    Enc pt strongly to start checking glucose readings now that she is eating again Reviewed hospital records, lab results and studies in detail   She did resume her medications  Explained that good glucose control is imp for infx control and healing (in setting of recent diverticular abscess)  Will update with glucose range       Combined hyperlipidemia associated with type 2 diabetes mellitus ([place])     Genitourinary   Chronic kidney disease (CKD), stage II (mild)    Reviewed hospital records, lab results and studies in detail  Improved at d/c Disc imp of fluid intake  Renal panel today         Other   Fecal occult blood test positive    Colonoscopy performed  Several polyps removed Small rectal ulcer noted and bx       Anemia (Chronic)    Cbc today  Hb 9.6 at d/c from hospital (improved)

## 2018-06-18 NOTE — Patient Instructions (Addendum)
Start checking blood glucose levels since you are eating now   Stay away from nuts/seeds/popcorn (for diverticulitis)   Use your incentive spirometer to prevent pneumonia   Keep eating regularly  Do your PT  Gradually increase your activity as well   Labs today -cbc and renal panel   Have home health or PT let me know if blood pressure gets higher and we will start back on the amlodipine (pressure looks better now)

## 2018-06-19 ENCOUNTER — Encounter: Payer: Self-pay | Admitting: *Deleted

## 2018-06-19 DIAGNOSIS — E1151 Type 2 diabetes mellitus with diabetic peripheral angiopathy without gangrene: Secondary | ICD-10-CM | POA: Diagnosis not present

## 2018-06-19 DIAGNOSIS — I129 Hypertensive chronic kidney disease with stage 1 through stage 4 chronic kidney disease, or unspecified chronic kidney disease: Secondary | ICD-10-CM | POA: Diagnosis not present

## 2018-06-19 DIAGNOSIS — E1122 Type 2 diabetes mellitus with diabetic chronic kidney disease: Secondary | ICD-10-CM | POA: Diagnosis not present

## 2018-06-19 DIAGNOSIS — Z792 Long term (current) use of antibiotics: Secondary | ICD-10-CM | POA: Diagnosis not present

## 2018-06-19 DIAGNOSIS — K219 Gastro-esophageal reflux disease without esophagitis: Secondary | ICD-10-CM | POA: Diagnosis not present

## 2018-06-19 DIAGNOSIS — D631 Anemia in chronic kidney disease: Secondary | ICD-10-CM | POA: Diagnosis not present

## 2018-06-19 DIAGNOSIS — I739 Peripheral vascular disease, unspecified: Secondary | ICD-10-CM | POA: Diagnosis not present

## 2018-06-19 DIAGNOSIS — J984 Other disorders of lung: Secondary | ICD-10-CM | POA: Diagnosis not present

## 2018-06-19 DIAGNOSIS — Z4682 Encounter for fitting and adjustment of non-vascular catheter: Secondary | ICD-10-CM | POA: Diagnosis not present

## 2018-06-19 DIAGNOSIS — E43 Unspecified severe protein-calorie malnutrition: Secondary | ICD-10-CM | POA: Diagnosis not present

## 2018-06-19 DIAGNOSIS — K5720 Diverticulitis of large intestine with perforation and abscess without bleeding: Secondary | ICD-10-CM | POA: Diagnosis not present

## 2018-06-19 DIAGNOSIS — Z79899 Other long term (current) drug therapy: Secondary | ICD-10-CM | POA: Diagnosis not present

## 2018-06-19 DIAGNOSIS — Z7984 Long term (current) use of oral hypoglycemic drugs: Secondary | ICD-10-CM | POA: Diagnosis not present

## 2018-06-19 DIAGNOSIS — N183 Chronic kidney disease, stage 3 (moderate): Secondary | ICD-10-CM | POA: Diagnosis not present

## 2018-06-21 NOTE — Assessment & Plan Note (Signed)
Cbc today  Hb 9.6 at d/c from hospital (improved)

## 2018-06-21 NOTE — Assessment & Plan Note (Signed)
Colonoscopy performed  Several polyps removed Small rectal ulcer noted and bx

## 2018-06-21 NOTE — Assessment & Plan Note (Signed)
Reviewed hospital records, lab results and studies in detail  Improved at d/c Disc imp of fluid intake  Renal panel today

## 2018-06-21 NOTE — Assessment & Plan Note (Signed)
bp in fair control at this time  BP Readings from Last 1 Encounters:  06/18/18 120/60   No changes needed  (continues to hold amlodipine from hospital dc since bp was low) Expect to add this back as bp rises (will watch) Most recent labs reviewed  Disc lifstyle change with low sodium diet and exercise

## 2018-06-21 NOTE — Assessment & Plan Note (Signed)
Reviewed hospital records, lab results and studies in detail   S/p surgery and drain placement Doing well  Finished abx (augmentin)  Diarrhea is resolved as well  Drain in place and working well  For gen surg f/u and drain removal in about 2 weeks  Continue HH for deconditioning/weakness and also surgical wound care

## 2018-06-21 NOTE — Assessment & Plan Note (Signed)
Enc pt strongly to start checking glucose readings now that she is eating again Reviewed hospital records, lab results and studies in detail   She did resume her medications  Explained that good glucose control is imp for infx control and healing (in setting of recent diverticular abscess)  Will update with glucose range

## 2018-06-21 NOTE — Assessment & Plan Note (Deleted)
Enc pt strongly to start checking glucose readings now that she is eating again Reviewed hospital records, lab results and studies in detail   She did resume her medications  Explained that good glucose control is imp for infx control and healing (in setting of recent diverticular abscess)  Will update with glucose range

## 2018-06-22 DIAGNOSIS — E43 Unspecified severe protein-calorie malnutrition: Secondary | ICD-10-CM | POA: Diagnosis not present

## 2018-06-22 DIAGNOSIS — E1122 Type 2 diabetes mellitus with diabetic chronic kidney disease: Secondary | ICD-10-CM | POA: Diagnosis not present

## 2018-06-22 DIAGNOSIS — E1151 Type 2 diabetes mellitus with diabetic peripheral angiopathy without gangrene: Secondary | ICD-10-CM | POA: Diagnosis not present

## 2018-06-22 DIAGNOSIS — Z4682 Encounter for fitting and adjustment of non-vascular catheter: Secondary | ICD-10-CM | POA: Diagnosis not present

## 2018-06-22 DIAGNOSIS — I739 Peripheral vascular disease, unspecified: Secondary | ICD-10-CM | POA: Diagnosis not present

## 2018-06-22 DIAGNOSIS — D631 Anemia in chronic kidney disease: Secondary | ICD-10-CM | POA: Diagnosis not present

## 2018-06-22 DIAGNOSIS — K5720 Diverticulitis of large intestine with perforation and abscess without bleeding: Secondary | ICD-10-CM | POA: Diagnosis not present

## 2018-06-22 DIAGNOSIS — I129 Hypertensive chronic kidney disease with stage 1 through stage 4 chronic kidney disease, or unspecified chronic kidney disease: Secondary | ICD-10-CM | POA: Diagnosis not present

## 2018-06-22 DIAGNOSIS — Z7984 Long term (current) use of oral hypoglycemic drugs: Secondary | ICD-10-CM | POA: Diagnosis not present

## 2018-06-22 DIAGNOSIS — J984 Other disorders of lung: Secondary | ICD-10-CM | POA: Diagnosis not present

## 2018-06-22 DIAGNOSIS — Z79899 Other long term (current) drug therapy: Secondary | ICD-10-CM | POA: Diagnosis not present

## 2018-06-22 DIAGNOSIS — N183 Chronic kidney disease, stage 3 (moderate): Secondary | ICD-10-CM | POA: Diagnosis not present

## 2018-06-22 DIAGNOSIS — K219 Gastro-esophageal reflux disease without esophagitis: Secondary | ICD-10-CM | POA: Diagnosis not present

## 2018-06-22 DIAGNOSIS — Z792 Long term (current) use of antibiotics: Secondary | ICD-10-CM | POA: Diagnosis not present

## 2018-06-24 ENCOUNTER — Other Ambulatory Visit (HOSPITAL_COMMUNITY): Payer: Self-pay | Admitting: Interventional Radiology

## 2018-06-24 DIAGNOSIS — K5720 Diverticulitis of large intestine with perforation and abscess without bleeding: Secondary | ICD-10-CM

## 2018-06-24 DIAGNOSIS — E43 Unspecified severe protein-calorie malnutrition: Secondary | ICD-10-CM | POA: Diagnosis not present

## 2018-06-24 DIAGNOSIS — Z4682 Encounter for fitting and adjustment of non-vascular catheter: Secondary | ICD-10-CM | POA: Diagnosis not present

## 2018-06-24 DIAGNOSIS — E1122 Type 2 diabetes mellitus with diabetic chronic kidney disease: Secondary | ICD-10-CM | POA: Diagnosis not present

## 2018-06-24 DIAGNOSIS — K219 Gastro-esophageal reflux disease without esophagitis: Secondary | ICD-10-CM | POA: Diagnosis not present

## 2018-06-24 DIAGNOSIS — Z7984 Long term (current) use of oral hypoglycemic drugs: Secondary | ICD-10-CM | POA: Diagnosis not present

## 2018-06-24 DIAGNOSIS — I129 Hypertensive chronic kidney disease with stage 1 through stage 4 chronic kidney disease, or unspecified chronic kidney disease: Secondary | ICD-10-CM | POA: Diagnosis not present

## 2018-06-24 DIAGNOSIS — J984 Other disorders of lung: Secondary | ICD-10-CM | POA: Diagnosis not present

## 2018-06-24 DIAGNOSIS — I739 Peripheral vascular disease, unspecified: Secondary | ICD-10-CM | POA: Diagnosis not present

## 2018-06-24 DIAGNOSIS — D631 Anemia in chronic kidney disease: Secondary | ICD-10-CM | POA: Diagnosis not present

## 2018-06-24 DIAGNOSIS — Z79899 Other long term (current) drug therapy: Secondary | ICD-10-CM | POA: Diagnosis not present

## 2018-06-24 DIAGNOSIS — Z792 Long term (current) use of antibiotics: Secondary | ICD-10-CM | POA: Diagnosis not present

## 2018-06-24 DIAGNOSIS — N183 Chronic kidney disease, stage 3 (moderate): Secondary | ICD-10-CM | POA: Diagnosis not present

## 2018-06-24 DIAGNOSIS — E1151 Type 2 diabetes mellitus with diabetic peripheral angiopathy without gangrene: Secondary | ICD-10-CM | POA: Diagnosis not present

## 2018-06-25 ENCOUNTER — Ambulatory Visit (HOSPITAL_COMMUNITY)
Admission: RE | Admit: 2018-06-25 | Discharge: 2018-06-25 | Disposition: A | Discharge status: Home / Self Care | Payer: Medicare Other | Source: Ambulatory Visit | Attending: Interventional Radiology | Admitting: Interventional Radiology

## 2018-06-25 ENCOUNTER — Other Ambulatory Visit (HOSPITAL_COMMUNITY): Payer: Self-pay | Admitting: Interventional Radiology

## 2018-06-25 DIAGNOSIS — I10 Essential (primary) hypertension: Secondary | ICD-10-CM | POA: Insufficient documentation

## 2018-06-25 DIAGNOSIS — K5720 Diverticulitis of large intestine with perforation and abscess without bleeding: Secondary | ICD-10-CM

## 2018-06-25 DIAGNOSIS — E1151 Type 2 diabetes mellitus with diabetic peripheral angiopathy without gangrene: Secondary | ICD-10-CM | POA: Insufficient documentation

## 2018-06-25 DIAGNOSIS — Z4803 Encounter for change or removal of drains: Secondary | ICD-10-CM | POA: Diagnosis not present

## 2018-06-25 DIAGNOSIS — K5790 Diverticulosis of intestine, part unspecified, without perforation or abscess without bleeding: Secondary | ICD-10-CM | POA: Insufficient documentation

## 2018-06-25 DIAGNOSIS — E1122 Type 2 diabetes mellitus with diabetic chronic kidney disease: Secondary | ICD-10-CM | POA: Diagnosis not present

## 2018-06-25 DIAGNOSIS — D631 Anemia in chronic kidney disease: Secondary | ICD-10-CM | POA: Diagnosis not present

## 2018-06-25 DIAGNOSIS — N183 Chronic kidney disease, stage 3 (moderate): Secondary | ICD-10-CM | POA: Diagnosis not present

## 2018-06-25 DIAGNOSIS — M1990 Unspecified osteoarthritis, unspecified site: Secondary | ICD-10-CM | POA: Diagnosis not present

## 2018-06-25 DIAGNOSIS — E785 Hyperlipidemia, unspecified: Secondary | ICD-10-CM | POA: Diagnosis not present

## 2018-06-25 DIAGNOSIS — Z792 Long term (current) use of antibiotics: Secondary | ICD-10-CM | POA: Diagnosis not present

## 2018-06-25 DIAGNOSIS — J984 Other disorders of lung: Secondary | ICD-10-CM | POA: Diagnosis not present

## 2018-06-25 DIAGNOSIS — I739 Peripheral vascular disease, unspecified: Secondary | ICD-10-CM | POA: Diagnosis not present

## 2018-06-25 DIAGNOSIS — E43 Unspecified severe protein-calorie malnutrition: Secondary | ICD-10-CM | POA: Diagnosis not present

## 2018-06-25 DIAGNOSIS — Z79899 Other long term (current) drug therapy: Secondary | ICD-10-CM | POA: Diagnosis not present

## 2018-06-25 DIAGNOSIS — I129 Hypertensive chronic kidney disease with stage 1 through stage 4 chronic kidney disease, or unspecified chronic kidney disease: Secondary | ICD-10-CM | POA: Diagnosis not present

## 2018-06-25 DIAGNOSIS — Z7984 Long term (current) use of oral hypoglycemic drugs: Secondary | ICD-10-CM | POA: Diagnosis not present

## 2018-06-25 DIAGNOSIS — F419 Anxiety disorder, unspecified: Secondary | ICD-10-CM | POA: Insufficient documentation

## 2018-06-25 DIAGNOSIS — K219 Gastro-esophageal reflux disease without esophagitis: Secondary | ICD-10-CM | POA: Diagnosis not present

## 2018-06-25 DIAGNOSIS — Z4682 Encounter for fitting and adjustment of non-vascular catheter: Secondary | ICD-10-CM | POA: Diagnosis not present

## 2018-06-25 NOTE — Progress Notes (Signed)
Chief Complaint: Patient was seen in consultation today for drain follow-up.  Referring Physician(s): Hoss,Arthur; Tower, [place]  History of Present Illness: Stephanie [name] is a 84 y.o. female with history of a left pelvic fluid collection.  Percutaneous drain placement on 06/05/2018.  Fluid was suspicious for hematoma and the cultures were negative.  Patient was last seen in the our clinic on 06/17/2018.  At that time, the abscess collection was much smaller but not completely resolved.  Patient and husband reported minimal output from the drain.  No recent fevers.  No GI issues.  However, patient still says that she does not feel well but her husband says that she is doing much better than she was prior to the drain.  IR was notified that the drain had been dislodged and patient presented to Interventional Radiology for possible drain exchange.  Drain was found to be completely dislodged and lying on the skin.  Past Medical History:  Diagnosis Date  . Allergy    allergic rhinitis  . Anxiety   . Arthritis    osteoarthritis  . Degenerative disk disease    in neck  . Diabetes mellitus    type II  . Fracture of femoral neck, left ([place]) 01/21/2018  . Hyperlipidemia   . Hypertension   . Hypoaldosteronism ([place]) 6/12   from DM causing hyperkalemia   . Osteopenia   . Periodontal disease   . Peripheral vascular disease ([place])   . Transient cerebral ischemia 11/06/2016    Past Surgical History:  Procedure Laterality Date  . CARDIAC CATHETERIZATION     x2 STENT  . CHOLECYSTECTOMY    . ESOPHAGOGASTRODUODENOSCOPY  03/2004   gastritis by biopsy  . GUM SURGERY  06/2010   laser surgery for gums  . HIP ARTHROPLASTY Left 01/21/2018   Procedure: ARTHROPLASTY BIPOLAR HIP (HEMIARTHROPLASTY);  Surgeon: Marchia Bond, MD;  Location: [facility];  Service: Orthopedics;  Laterality: Left;  . IR RADIOLOGIST EVAL & MGMT  06/17/2018  . MM BREAST STEREO BX*L*R/S  12/12   normal  . TONSILLECTOMY    .  TRANSTHORACIC ECHOCARDIOGRAM  11/2016   EF 55-60%, grade 1 DD with elevated LV end diastolic filling pressures, mildly dilated LA, mild TR and PR.   . TUBAL LIGATION      Allergies: Ace inhibitors; Amlodipine besy-benazepril hcl; Clopidogrel bisulfate; and Erythromycin  Medications: Prior to Admission medications   Medication Sig Start Date End Date Taking? Authorizing Provider  acetaminophen (TYLENOL) 325 MG tablet Take 2 tablets (650 mg total) by mouth every 6 (six) hours as needed. 01/21/18   Marchia Bond, MD  amLODipine (NORVASC) 5 MG tablet Take 1 tablet (5 mg total) by mouth daily. 02/16/18   Tower, Wynelle Fanny, MD  atorvastatin (LIPITOR) 20 MG tablet TAKE 1 TABLET BY MOUTH ONCE DAILY Patient taking differently: Take 20 mg by mouth daily.  04/27/18   Tower, Wynelle Fanny, MD  dipyridamole-aspirin (AGGRENOX) 200-25 MG 12hr capsule Take 1 capsule by mouth 2 (two) times daily. 06/10/18   Domenic Polite, MD  famotidine (PEPCID) 40 MG tablet Take 1 tablet (40 mg total) by mouth daily. 04/28/18   Thornton Park, MD  glipiZIDE (GLIPIZIDE XL) 10 MG 24 hr tablet TAKE 1 TABLET BY MOUTH ONCE DAILY WITH BREAKFAST Patient taking differently: Take 10 mg by mouth daily with breakfast.  02/16/18   Tower, Wynelle Fanny, MD  metFORMIN (GLUCOPHAGE) 1000 MG tablet Take 1 tablet (1,000 mg total) by mouth 2 (two) times daily with a  meal. 02/16/18   Tower, Wynelle Fanny, MD  metoprolol succinate (TOPROL-XL) 100 MG 24 hr tablet TAKE 1 TABLET BY MOUTH ONCE DAILY TAKE  WITH  OR  IMMEDIATELY  FOLLOWING  A  MEAL Patient taking differently: Take 100 mg by mouth daily.  02/16/18   Tower, Wynelle Fanny, MD  PARoxetine (PAXIL) 40 MG tablet Take 1 tablet (40 mg total) by mouth daily. 08/27/17   Tower, Wynelle Fanny, MD     Family History  Problem Relation Age of Onset  . Cancer Mother        vaginal CA in situ  . Hypertension Mother   . Heart disease Mother        CAD and MI  . Heart disease Father        CAD  . Diabetes Father   . Heart disease  Son        congenital valve problem    Social History   Socioeconomic History  . Marital status: Married    Spouse name: Not on file  . Number of children: 3  . Years of education: Not on file  . Highest education level: Not on file  Occupational History  . Not on file  Social Needs  . Financial resource strain: Not on file  . Food insecurity:    Worry: Not on file    Inability: Not on file  . Transportation needs:    Medical: Not on file    Non-medical: Not on file  Tobacco Use  . Smoking status: Never Smoker  . Smokeless tobacco: Never Used  Substance and Sexual Activity  . Alcohol use: No    Alcohol/week: 0.0 standard drinks  . Drug use: No  . Sexual activity: Not on file  Lifestyle  . Physical activity:    Days per week: Not on file    Minutes per session: Not on file  . Stress: Not on file  Relationships  . Social connections:    Talks on phone: Not on file    Gets together: Not on file    Attends religious service: Not on file    Active member of club or organization: Not on file    Attends meetings of clubs or organizations: Not on file    Relationship status: Not on file  Other Topics Concern  . Not on file  Social History Narrative  . Not on file    .  Review of Systems  Constitutional: Negative for fever.  Gastrointestinal: Negative.     Vital Signs: There were no vitals taken for this visit.  Physical Exam Constitutional:      General: She is not in acute distress. Skin:    Comments: Drainage catheter has been completely dislodged but still sutured to the skin.  White material over the drain site compatible with Desitin.        Imaging: Dg Chest 2 View  Result Date: 06/04/2018 CLINICAL DATA:  Weakness and cough x1 week, recent colonoscopy. EXAM: CHEST - 2 VIEW COMPARISON:  01/21/2018 FINDINGS: Normal heart size. Moderate aortic atherosclerosis at the arch without aneurysm. Chronic interstitial prominence is noted of the lungs without  pulmonary consolidation, effusion pneumothorax. Calcified densities project over the lung bases compatible with soft tissue calcifications within the breasts on prior exam. There appears be a large eggshell calcification in the left upper quadrant of the abdomen measuring approximately 2.6 x 1.8 cm on the frontal view possibly representing a calcified splenic artery aneurysm, calcified splenic cyst or calcified  diverticulum. IMPRESSION: 1. No active pulmonary disease. Aortic atherosclerosis. 2. Chronic interstitial prominence is noted of the lungs without pulmonary consolidation, effusion, pneumothorax. Electronically Signed   By: Ashley Royalty M.D.   On: 06/04/2018 19:43   Ct Abdomen Pelvis W Contrast  Result Date: 06/17/2018 CLINICAL DATA:  Diverticular abscess, status post percutaneous drainage 06/05/2018. Minimal output. EXAM: CT ABDOMEN AND PELVIS WITH CONTRAST TECHNIQUE: Multidetector CT imaging of the abdomen and pelvis was performed using the standard protocol following bolus administration of intravenous contrast. CONTRAST:  66mL OMNIPAQUE IOHEXOL 300 MG/ML  SOLN COMPARISON:  06/04/2018 FINDINGS: Lower chest: Small bilateral pleural effusions. Dependent atelectasis in the visualized lung bases. Hepatobiliary: No focal liver abnormality is seen. Status post cholecystectomy. No biliary dilatation. Pancreas: Mild diffuse atrophy without mass or ductal dilatation. Spleen: Normal in size without focal abnormality. 2.6 cm splenic artery aneurysm with heavy peripheral calcification as before. Adrenals/Urinary Tract: Normal adrenal glands. Unremarkable kidneys. No hydronephrosis or urolithiasis evident. Urinary bladder is nondistended. Stomach/Bowel: Stomach is decompressed. Small bowel is nondilated. Normal appendix. Scattered descending and sigmoid diverticula. Stable left transgluteal pigtail drain catheter, posterior to the descending/sigmoid junction. Small residual peripherally enhancing loculated fluid  collection measuring approximately 2.6 x 2.3 x 0.9 cm around the drain catheter. Vascular/Lymphatic: Mild aortoiliac partially calcified atheromatous plaque without aneurysm or evident stenosis. No abdominal or pelvic adenopathy. Portal vein patent. Reproductive: Uterus and bilateral adnexa are unremarkable, with some image degradation secondary to streak artifact from the left hip arthroplasty hardware. Other: No ascites.  No undrained fluid collections.  No free air. Musculoskeletal: Left hip arthroplasty component projects in expected location. Mild lumbar dextroscoliosis without underlying vertebral anomaly, mild multilevel spondylitic change most marked L4-5. no fracture or worrisome bone lesion. IMPRESSION: 1. Significant improvement in diverticular abscess post drainage with small residual loculated fluid around the drain catheter. 2. No new or undrained fluid collections. 3. Descending and sigmoid diverticulosis. 4. Bilateral pleural effusions 5. 2.6 cm calcified splenic artery aneurysm. Visceral aneurysms greater than 2 cm have an increased risk of rupture. Electronically Signed   By: Lucrezia Europe M.D.   On: 06/17/2018 12:54   Ct Abdomen Pelvis W Contrast  Result Date: 06/04/2018 CLINICAL DATA:  Abdominal pain, acute, generalized. Colonoscopy 1 week ago. Dark red blood in stool. Nausea, vomiting, and diarrhea. EXAM: CT ABDOMEN AND PELVIS WITH CONTRAST TECHNIQUE: Multidetector CT imaging of the abdomen and pelvis was performed using the standard protocol following bolus administration of intravenous contrast. CONTRAST:  131mL OMNIPAQUE IOHEXOL 300 MG/ML  SOLN COMPARISON:  CT of the abdomen and pelvis 9/12/5 FINDINGS: Lower chest: Posteromedial right lower lobe airspace disease is concerning for infection or aspiration. Minimal atelectasis is present at the right base. The heart size is normal. A small hiatal hernia is present. No significant pleural or pericardial effusion is present. Hepatobiliary: No  focal liver abnormality is seen. Status post cholecystectomy. No biliary dilatation. Pancreas: Unremarkable. No pancreatic ductal dilatation or surrounding inflammatory changes. Spleen: Normal in size without focal abnormality. Adrenals/Urinary Tract: Adrenal glands are normal bilaterally. Kidneys and ureters are within normal limits. There is no stone or mass lesion. No obstruction is present. The urinary bladder is within normal limits. Stomach/Bowel: The stomach is otherwise normal. Duodenum is within normal limits as well. Small bowel is unremarkable. Terminal ileum is within normal limits. Appendix is visualized and normal. The ascending and transverse colon are within normal limits. Descending colon is unremarkable. Diverticular changes are present distally. Diverticular changes extend into the sigmoid colon.  There is focal inflammation the distal sigmoid colon, above the rectum. Adjacent peripherally enhancing fluid collection in the left anatomic pelvis measures 5.1 x 2.2 x 5.9 cm. Vascular/Lymphatic: No significant adenopathy is present. Atherosclerotic calcifications are present in the aorta without aneurysm. Reproductive: Status post hysterectomy. No adnexal masses. Other: The a small amount of free fluid is present in the anatomic pelvis separate from the peripherally enhancing collection. There is no free air. No ventral hernia is present. Musculoskeletal: Left total hip arthroplasty is present. Bony pelvis is otherwise unremarkable. Degenerative grade 1 anterolisthesis is present at L4-5 with uncovering of a broad-based disc protrusion leading to central and bilateral foraminal narrowing. Central and bilateral foraminal narrowing is also present at L5-S1. IMPRESSION: 1. Sigmoid diverticular disease with inflammatory changes and pericolonic fluid collection likely reflecting acute diverticulitis complicated with abscess measuring 5.1 x 2.2 x 5.9 cm. 2. Additional free fluid in the anatomic pelvis is  likely reactive. 3. No free air to suggest perforation. 4. Right lower lobe airspace disease concerning for pneumonia or aspiration. 5. Degenerative changes in the lower lumbar spine. These results were called by telephone at the time of interpretation on 06/04/2018 at 8:56 pm to Dr. Veryl Speak , who verbally acknowledged these results. Electronically Signed   By: San Morelle M.D.   On: 06/04/2018 20:56   Ct Image Guided Drainage By Percutaneous Catheter  Result Date: 06/05/2018 INDICATION: Left pelvic abscess concern for diverticulitis EXAM: CT guided left trans gluteal pelvic fluid collection drain MEDICATIONS: The patient is currently admitted to the hospital and receiving intravenous antibiotics. The antibiotics were administered within an appropriate time frame prior to the initiation of the procedure. ANESTHESIA/SEDATION: Fentanyl 50 mcg IV; Versed 1.0 mg IV Moderate Sedation Time:  13 minutes The patient was continuously monitored during the procedure by the interventional radiology nurse under my direct supervision. COMPLICATIONS: None immediate. PROCEDURE: Informed written consent was obtained from the patient after a thorough discussion of the procedural risks, benefits and alternatives. All questions were addressed. Maximal Sterile Barrier Technique was utilized including caps, mask, sterile gowns, sterile gloves, sterile drape, hand hygiene and skin antiseptic. A timeout was performed prior to the initiation of the procedure. No previous imaging reviewed. Patient position prone. Noncontrast localization CT performed. The posterior left pelvic fluid collection was localized and marked for a left trans gluteal posterior approach. Under sterile conditions and local anesthesia, a 18 gauge 10 cm access needle was advanced from a left posterior trans gluteal approach into the fluid collection. Needle position confirmed with CT. Syringe aspiration yielded dark thin fluid suspicious for chronic  hematoma. Sample sent for culture. Guidewire inserted followed by tract dilatation to insert a 10 [place] drain. Drain catheter position confirmed with CT. Catheter secured with Prolene suture and connected to external suction bulb. Sterile dressing applied. No immediate complication. Patient tolerated the procedure well. IMPRESSION: Successful CT-guided left pelvic fluid collection drain placement as above. Electronically Signed   By: Jerilynn Mages.  Shick M.D.   On: 06/05/2018 16:09   Ir Radiologist Eval & Mgmt  Result Date: 06/17/2018 Please refer to notes tab for details about interventional procedure. (Op Note)   Labs:  CBC: Recent Labs    06/08/18 0707 06/09/18 0201 06/10/18 0537 06/18/18 1305  WBC 10.5 11.9* 12.2* 9.2  HGB 8.8* 9.2* 9.6* 10.1*  HCT 25.8* 28.2* 28.7* 29.9*  PLT 315 382 471* 498.0*    COAGS: Recent Labs    01/21/18 1323 06/05/18 1400  INR 1.09 1.15  BMP: Recent Labs    06/07/18 0244 06/08/18 0707 06/09/18 0201 06/10/18 0537 06/18/18 1305  NA 131* 132* 132* 130* 134*  K 3.1* 3.3* 5.1 4.1 3.6  CL 102 106 105 99 99  CO2 19* 19* 18* 20* 27  GLUCOSE 156* 190* 211* 185* 178*  BUN 15 7* 6* 7* 19  CALCIUM 8.3* 7.9* 8.5* 8.7* 8.8  CREATININE 1.03* 0.98 0.94 0.93 1.00  GFRNONAA 49* 52* 55* 57*  --   GFRAA 57* >60 >60 >60  --     LIVER FUNCTION TESTS: Recent Labs    08/27/17 1251 02/09/18 0856 06/04/18 1819 06/18/18 1305  BILITOT 0.4 0.5 0.6  --   AST 14 14 36  --   ALT 6 7 10   --   ALKPHOS 46 89 51  --   PROT 7.2 7.3 6.0*  --   ALBUMIN 4.3 3.9 2.6* 3.2*    TUMOR MARKERS: No results for input(s): AFPTM, CEA, CA199, CHROMGRNA in the last 8760 hours.  Assessment and Plan:  84 year old with history of a pelvic fluid collection and drain placement.  Etiology for the fluid collection remains uncertain.  Based on the drain placement procedure note, the fluid was suspicious for hematoma and cultures were negative.  Although the patient does have  diverticulosis, it is not clear this was a true diverticular abscess.  Unfortunately, the drain has been completely dislodged.  Based on the CT findings from 06/17/2018 and a history of minimal output, I suspect that the pelvic fluid collection has resolved and a bowel fistula is unlikely based on the minimal output..  No indication for replacing the drain with CT guidance at this time.  Instructed the patient to follow-up with her primary care provider.  Patient can follow-up with IR as needed.   Electronically Signed: Burman Riis 06/25/2018, 3:57 PM   I spent a total of    15 Minutes in face to face in clinical consultation, greater than 50% of which was counseling/coordinating care for drain care.    patient ID: Stephanie [name], female   DOB: [birth year], 84 y.o.   MRN: [id number]

## 2018-06-30 ENCOUNTER — Other Ambulatory Visit: Payer: Medicare Other

## 2018-06-30 DIAGNOSIS — Z792 Long term (current) use of antibiotics: Secondary | ICD-10-CM | POA: Diagnosis not present

## 2018-06-30 DIAGNOSIS — E43 Unspecified severe protein-calorie malnutrition: Secondary | ICD-10-CM | POA: Diagnosis not present

## 2018-06-30 DIAGNOSIS — N183 Chronic kidney disease, stage 3 (moderate): Secondary | ICD-10-CM | POA: Diagnosis not present

## 2018-06-30 DIAGNOSIS — Z4682 Encounter for fitting and adjustment of non-vascular catheter: Secondary | ICD-10-CM | POA: Diagnosis not present

## 2018-06-30 DIAGNOSIS — E1122 Type 2 diabetes mellitus with diabetic chronic kidney disease: Secondary | ICD-10-CM | POA: Diagnosis not present

## 2018-06-30 DIAGNOSIS — K219 Gastro-esophageal reflux disease without esophagitis: Secondary | ICD-10-CM | POA: Diagnosis not present

## 2018-06-30 DIAGNOSIS — J984 Other disorders of lung: Secondary | ICD-10-CM | POA: Diagnosis not present

## 2018-06-30 DIAGNOSIS — E1151 Type 2 diabetes mellitus with diabetic peripheral angiopathy without gangrene: Secondary | ICD-10-CM | POA: Diagnosis not present

## 2018-06-30 DIAGNOSIS — K5720 Diverticulitis of large intestine with perforation and abscess without bleeding: Secondary | ICD-10-CM | POA: Diagnosis not present

## 2018-06-30 DIAGNOSIS — I129 Hypertensive chronic kidney disease with stage 1 through stage 4 chronic kidney disease, or unspecified chronic kidney disease: Secondary | ICD-10-CM | POA: Diagnosis not present

## 2018-06-30 DIAGNOSIS — Z7984 Long term (current) use of oral hypoglycemic drugs: Secondary | ICD-10-CM | POA: Diagnosis not present

## 2018-06-30 DIAGNOSIS — Z79899 Other long term (current) drug therapy: Secondary | ICD-10-CM | POA: Diagnosis not present

## 2018-06-30 DIAGNOSIS — D631 Anemia in chronic kidney disease: Secondary | ICD-10-CM | POA: Diagnosis not present

## 2018-06-30 DIAGNOSIS — I739 Peripheral vascular disease, unspecified: Secondary | ICD-10-CM | POA: Diagnosis not present

## 2018-07-10 DIAGNOSIS — Z79899 Other long term (current) drug therapy: Secondary | ICD-10-CM | POA: Diagnosis not present

## 2018-07-10 DIAGNOSIS — I129 Hypertensive chronic kidney disease with stage 1 through stage 4 chronic kidney disease, or unspecified chronic kidney disease: Secondary | ICD-10-CM | POA: Diagnosis not present

## 2018-07-10 DIAGNOSIS — Z792 Long term (current) use of antibiotics: Secondary | ICD-10-CM | POA: Diagnosis not present

## 2018-07-10 DIAGNOSIS — J984 Other disorders of lung: Secondary | ICD-10-CM | POA: Diagnosis not present

## 2018-07-10 DIAGNOSIS — Z7984 Long term (current) use of oral hypoglycemic drugs: Secondary | ICD-10-CM | POA: Diagnosis not present

## 2018-07-10 DIAGNOSIS — I739 Peripheral vascular disease, unspecified: Secondary | ICD-10-CM | POA: Diagnosis not present

## 2018-07-10 DIAGNOSIS — N183 Chronic kidney disease, stage 3 (moderate): Secondary | ICD-10-CM | POA: Diagnosis not present

## 2018-07-10 DIAGNOSIS — D631 Anemia in chronic kidney disease: Secondary | ICD-10-CM | POA: Diagnosis not present

## 2018-07-10 DIAGNOSIS — E1151 Type 2 diabetes mellitus with diabetic peripheral angiopathy without gangrene: Secondary | ICD-10-CM | POA: Diagnosis not present

## 2018-07-10 DIAGNOSIS — K219 Gastro-esophageal reflux disease without esophagitis: Secondary | ICD-10-CM | POA: Diagnosis not present

## 2018-07-10 DIAGNOSIS — E43 Unspecified severe protein-calorie malnutrition: Secondary | ICD-10-CM | POA: Diagnosis not present

## 2018-07-10 DIAGNOSIS — K5720 Diverticulitis of large intestine with perforation and abscess without bleeding: Secondary | ICD-10-CM | POA: Diagnosis not present

## 2018-07-10 DIAGNOSIS — E1122 Type 2 diabetes mellitus with diabetic chronic kidney disease: Secondary | ICD-10-CM | POA: Diagnosis not present

## 2018-07-10 DIAGNOSIS — Z4682 Encounter for fitting and adjustment of non-vascular catheter: Secondary | ICD-10-CM | POA: Diagnosis not present

## 2018-07-14 DIAGNOSIS — E1122 Type 2 diabetes mellitus with diabetic chronic kidney disease: Secondary | ICD-10-CM | POA: Diagnosis not present

## 2018-07-14 DIAGNOSIS — Z792 Long term (current) use of antibiotics: Secondary | ICD-10-CM | POA: Diagnosis not present

## 2018-07-14 DIAGNOSIS — I129 Hypertensive chronic kidney disease with stage 1 through stage 4 chronic kidney disease, or unspecified chronic kidney disease: Secondary | ICD-10-CM | POA: Diagnosis not present

## 2018-07-14 DIAGNOSIS — Z4682 Encounter for fitting and adjustment of non-vascular catheter: Secondary | ICD-10-CM | POA: Diagnosis not present

## 2018-07-14 DIAGNOSIS — D631 Anemia in chronic kidney disease: Secondary | ICD-10-CM | POA: Diagnosis not present

## 2018-07-14 DIAGNOSIS — Z79899 Other long term (current) drug therapy: Secondary | ICD-10-CM | POA: Diagnosis not present

## 2018-07-14 DIAGNOSIS — E43 Unspecified severe protein-calorie malnutrition: Secondary | ICD-10-CM | POA: Diagnosis not present

## 2018-07-14 DIAGNOSIS — E1151 Type 2 diabetes mellitus with diabetic peripheral angiopathy without gangrene: Secondary | ICD-10-CM | POA: Diagnosis not present

## 2018-07-14 DIAGNOSIS — J984 Other disorders of lung: Secondary | ICD-10-CM | POA: Diagnosis not present

## 2018-07-14 DIAGNOSIS — Z7984 Long term (current) use of oral hypoglycemic drugs: Secondary | ICD-10-CM | POA: Diagnosis not present

## 2018-07-14 DIAGNOSIS — N183 Chronic kidney disease, stage 3 (moderate): Secondary | ICD-10-CM | POA: Diagnosis not present

## 2018-07-14 DIAGNOSIS — K5720 Diverticulitis of large intestine with perforation and abscess without bleeding: Secondary | ICD-10-CM | POA: Diagnosis not present

## 2018-07-14 DIAGNOSIS — K219 Gastro-esophageal reflux disease without esophagitis: Secondary | ICD-10-CM | POA: Diagnosis not present

## 2018-07-14 DIAGNOSIS — I739 Peripheral vascular disease, unspecified: Secondary | ICD-10-CM | POA: Diagnosis not present

## 2018-08-18 ENCOUNTER — Other Ambulatory Visit (INDEPENDENT_AMBULATORY_CARE_PROVIDER_SITE_OTHER): Payer: Medicare Other

## 2018-08-18 DIAGNOSIS — I10 Essential (primary) hypertension: Secondary | ICD-10-CM

## 2018-08-18 DIAGNOSIS — E782 Mixed hyperlipidemia: Secondary | ICD-10-CM

## 2018-08-18 DIAGNOSIS — IMO0002 Reserved for concepts with insufficient information to code with codable children: Secondary | ICD-10-CM

## 2018-08-18 DIAGNOSIS — E1165 Type 2 diabetes mellitus with hyperglycemia: Secondary | ICD-10-CM | POA: Diagnosis not present

## 2018-08-18 DIAGNOSIS — E1169 Type 2 diabetes mellitus with other specified complication: Secondary | ICD-10-CM | POA: Diagnosis not present

## 2018-08-18 DIAGNOSIS — E11319 Type 2 diabetes mellitus with unspecified diabetic retinopathy without macular edema: Secondary | ICD-10-CM | POA: Diagnosis not present

## 2018-08-18 LAB — LIPID PANEL
Cholesterol: 121 mg/dL (ref 0–200)
HDL: 35.6 mg/dL — ABNORMAL LOW (ref >39.00)
LDL Cholesterol: 52 mg/dL (ref 0–99)
NONHDL: 84.96
Total CHOL/HDL Ratio: 3
Triglycerides: 167 mg/dL — ABNORMAL HIGH (ref 0.0–149.0)
VLDL: 33.4 mg/dL (ref 0.0–40.0)

## 2018-08-18 LAB — COMPREHENSIVE METABOLIC PANEL
ALT: 9 U/L (ref 0–35)
AST: 14 U/L (ref 0–37)
Albumin: 4.2 g/dL (ref 3.5–5.2)
Alkaline Phosphatase: 47 U/L (ref 39–117)
BUN: 26 mg/dL — ABNORMAL HIGH (ref 6–23)
CO2: 25 meq/L (ref 19–32)
Calcium: 10 mg/dL (ref 8.4–10.5)
Chloride: 99 mEq/L (ref 96–112)
Creatinine, Ser: 0.95 mg/dL (ref 0.40–1.20)
GFR: 56.24 mL/min — ABNORMAL LOW (ref >60.00)
Glucose, Bld: 106 mg/dL — ABNORMAL HIGH (ref 70–99)
Potassium: 4.4 mEq/L (ref 3.5–5.1)
SODIUM: 133 meq/L — AB (ref 135–145)
Total Bilirubin: 0.4 mg/dL (ref 0.2–1.2)
Total Protein: 7.2 g/dL (ref 6.0–8.3)

## 2018-08-18 LAB — HEMOGLOBIN A1C: Hgb A1c MFr Bld: 5.8 % (ref 4.6–6.5)

## 2018-08-21 ENCOUNTER — Ambulatory Visit: Payer: Medicare Other | Admitting: Family Medicine

## 2018-08-24 ENCOUNTER — Ambulatory Visit (INDEPENDENT_AMBULATORY_CARE_PROVIDER_SITE_OTHER): Payer: Medicare Other | Admitting: Family Medicine

## 2018-08-24 ENCOUNTER — Encounter: Payer: Self-pay | Admitting: Family Medicine

## 2018-08-24 VITALS — BP 124/66 | HR 65 | Temp 97.6°F | Ht 61.0 in

## 2018-08-24 DIAGNOSIS — E782 Mixed hyperlipidemia: Secondary | ICD-10-CM

## 2018-08-24 DIAGNOSIS — E1351 Other specified diabetes mellitus with diabetic peripheral angiopathy without gangrene: Secondary | ICD-10-CM | POA: Diagnosis not present

## 2018-08-24 DIAGNOSIS — E441 Mild protein-calorie malnutrition: Secondary | ICD-10-CM | POA: Diagnosis not present

## 2018-08-24 DIAGNOSIS — E1169 Type 2 diabetes mellitus with other specified complication: Secondary | ICD-10-CM | POA: Diagnosis not present

## 2018-08-24 DIAGNOSIS — E11319 Type 2 diabetes mellitus with unspecified diabetic retinopathy without macular edema: Secondary | ICD-10-CM

## 2018-08-24 DIAGNOSIS — I10 Essential (primary) hypertension: Secondary | ICD-10-CM

## 2018-08-24 DIAGNOSIS — N182 Chronic kidney disease, stage 2 (mild): Secondary | ICD-10-CM

## 2018-08-24 NOTE — Assessment & Plan Note (Signed)
Due for eye exam Sent for last report  Pt wants to schedule her own exam  No reported vision changes

## 2018-08-24 NOTE — Assessment & Plan Note (Signed)
Excellent control with metformin and glipizide  Also better diet  Lab Results  Component Value Date   HGBA1C 5.8 08/18/2018   No hypoglycemia Pt will schedule annual eye exam  Good foot care microalbumin today  On statin  F/u 6 mo

## 2018-08-24 NOTE — Assessment & Plan Note (Signed)
Disc goals for lipids and reasons to control them Rev last labs with pt Rev low sat fat diet in detail  Continues atorvastatin with good LDL control  Recommend exercise/omega 3 for stable low HDL

## 2018-08-24 NOTE — Assessment & Plan Note (Signed)
bp in fair control at this time  BP Readings from Last 1 Encounters:  08/24/18 124/66   No changes needed Most recent labs reviewed  Disc lifstyle change with low sodium diet and exercise

## 2018-08-24 NOTE — Assessment & Plan Note (Signed)
Pt has lost 4 more lb (poss from stress) DM in good control  Disc ways to enc more protein including supplements

## 2018-08-24 NOTE — Assessment & Plan Note (Signed)
Stable labs Enc good fluid intake  Avoid nsaids

## 2018-08-24 NOTE — Assessment & Plan Note (Signed)
On statin and aggrenox Mild carotid disease  Yearly f/u with cardiology

## 2018-08-24 NOTE — Patient Instructions (Addendum)
We do not want you to loose more weight  Get protein with every meal  Snacks with protein is ok   You can try different brands of protein shakes   Please don't forget to follow up for eye appointment   Stick with your walker to prevent falls   Return for annual exam in 6 months

## 2018-08-24 NOTE — Progress Notes (Signed)
Subjective:    Patient ID: Stephanie [name], female    DOB: August 31, 1933, 85 y.o.   MRN: [id number]  HPI  Here for f/u of chronic health problems   More problems with balance  Using walker (occ cane)  Had one fall when she was trying to get something out of front seat of her car  overall doing pretty good   Under a lot of stress - worried about son / having marital problems (he had a stroke)   Wt Readings from Last 3 Encounters:  06/18/18 104 lb 4 oz (47.3 kg)  06/04/18 108 lb (49 kg)  05/13/18 111 lb (50.3 kg)  wt is down - perhaps due to worry  Appetite is fair  For protein eats meat and cheese  19.70 kg/m   bp is stable today  No cp or palpitations or headaches or edema  No side effects to medicines  BP Readings from Last 3 Encounters:  08/24/18 124/66  06/18/18 120/60  06/10/18 129/76     H/of mild CKD Lab Results  Component Value Date   CREATININE 0.95 08/18/2018   BUN 26 (H) 08/18/2018   NA 133 (L) 08/18/2018   K 4.4 08/18/2018   CL 99 08/18/2018   CO2 25 08/18/2018  stable    DM2 with retinopathy  Lab Results  Component Value Date   HGBA1C 5.8 08/18/2018   very well controlled  Metformin  Glipizide xl  She occasionally misses a glucose level  Log today 89 to 140s  occ feels "nervous" when she needs a snack  Needs an eye exam (had last one a year ago) --Dr Matilde Sprang   Lab Results  Component Value Date   MICROALBUR 6.2 (H) 08/27/2017   Due for this   Eye exam -will schedule   Needs a handicapped form for sticker   Hyperlipidemia Lab Results  Component Value Date   CHOL 121 08/18/2018   CHOL 107 02/09/2018   CHOL 107 08/27/2017   Lab Results  Component Value Date   HDL 35.60 (L) 08/18/2018   HDL 34.60 (L) 02/09/2018   HDL 36.90 (L) 08/27/2017   Lab Results  Component Value Date   LDLCALC 52 08/18/2018   LDLCALC 38 02/09/2018   LDLCALC 37 08/27/2017   Lab Results  Component Value Date   TRIG 167.0 (H) 08/18/2018   TRIG 176.0 (H)  02/09/2018   TRIG 166.0 (H) 08/27/2017   Lab Results  Component Value Date   CHOLHDL 3 08/18/2018   CHOLHDL 3 02/09/2018   CHOLHDL 3 08/27/2017   Lab Results  Component Value Date   LDLDIRECT 54.0 09/04/2015   LDLDIRECT 43.0 02/28/2015   LDLDIRECT 59.0 08/23/2014   Atorvastatin and diet  HDL is low as usual  Does eat fish   Lab Results  Component Value Date   CREATININE 0.95 08/18/2018   BUN 26 (H) 08/18/2018   NA 133 (L) 08/18/2018   K 4.4 08/18/2018   CL 99 08/18/2018   CO2 25 08/18/2018    Patient Active Problem List   Diagnosis Date Noted  . Colonic diverticular abscess 06/05/2018  . Anemia 06/05/2018  . Protein calorie malnutrition ([place]) 06/05/2018  . Fecal occult blood test positive 03/12/2018  . Closed fracture of left hip ([place])   . Chronic kidney disease (CKD), stage II (mild) 01/22/2018  . Fracture of femoral neck, left ([place]) 01/21/2018  . Depression 01/21/2018  . HLD (hyperlipidemia) 01/21/2018  . GERD (gastroesophageal reflux disease) 01/21/2018  .  CAD (coronary artery disease) 01/21/2018  . Subcapital fracture of left hip ([place]) 01/21/2018  . Carotid stenosis, bilateral 11/20/2016  . Transient cerebral ischemia 11/06/2016  . Vitamin D deficiency 09/04/2015  . Routine general medical examination at a health care facility 09/04/2015  . Encounter for Medicare annual wellness exam 05/30/2014  . Colon cancer screening 05/30/2014  . History of fall 05/26/2013  . Gynecological examination 02/20/2011  . HELICOBACTER PYLORI INFECTION 12/19/2006  . HSV 12/19/2006  . Type 2 diabetes mellitus ([place]) 12/19/2006  . Diabetic retinopathy ([place]) 12/19/2006  . Combined hyperlipidemia associated with type 2 diabetes mellitus ([place]) 12/19/2006  . ANXIETY 12/19/2006  . HTN (hypertension) 12/19/2006  . Coronary atherosclerosis 12/19/2006  . Peripheral vascular disease due to secondary diabetes mellitus ([place]) 12/19/2006  . ALLERGIC RHINITIS 12/19/2006  . OSTEOARTHRITIS  12/19/2006  . Osteopenia 12/19/2006   Past Medical History:  Diagnosis Date  . Allergy    allergic rhinitis  . Anxiety   . Arthritis    osteoarthritis  . Degenerative disk disease    in neck  . Diabetes mellitus    type II  . Fracture of femoral neck, left ([place]) 01/21/2018  . Hyperlipidemia   . Hypertension   . Hypoaldosteronism ([place]) 6/12   from DM causing hyperkalemia   . Osteopenia   . Periodontal disease   . Peripheral vascular disease ([place])   . Transient cerebral ischemia 11/06/2016   Past Surgical History:  Procedure Laterality Date  . CARDIAC CATHETERIZATION     x2 STENT  . CHOLECYSTECTOMY    . ESOPHAGOGASTRODUODENOSCOPY  03/2004   gastritis by biopsy  . GUM SURGERY  06/2010   laser surgery for gums  . HIP ARTHROPLASTY Left 01/21/2018   Procedure: ARTHROPLASTY BIPOLAR HIP (HEMIARTHROPLASTY);  Surgeon: Marchia Bond, MD;  Location: [facility];  Service: Orthopedics;  Laterality: Left;  . IR RADIOLOGIST EVAL & MGMT  06/17/2018  . MM BREAST STEREO BX*L*R/S  12/12   normal  . TONSILLECTOMY    . TRANSTHORACIC ECHOCARDIOGRAM  11/2016   EF 55-60%, grade 1 DD with elevated LV end diastolic filling pressures, mildly dilated LA, mild TR and PR.   . TUBAL LIGATION     Social History   Tobacco Use  . Smoking status: Never Smoker  . Smokeless tobacco: Never Used  Substance Use Topics  . Alcohol use: No    Alcohol/week: 0.0 standard drinks  . Drug use: No   Family History  Problem Relation Age of Onset  . Cancer Mother        vaginal CA in situ  . Hypertension Mother   . Heart disease Mother        CAD and MI  . Heart disease Father        CAD  . Diabetes Father   . Heart disease Son        congenital valve problem   Allergies  Allergen Reactions  . Ace Inhibitors Other (See Comments)    REACTION: generic, reaction not known  . Amlodipine Besy-Benazepril Hcl Other (See Comments)    REACTION: chest pain  . Clopidogrel Bisulfate Other (See Comments)     REACTION: GI  . Erythromycin Other (See Comments)    REACTION: GI upset   Current Outpatient Medications on File Prior to Visit  Medication Sig Dispense Refill  . atorvastatin (LIPITOR) 20 MG tablet TAKE 1 TABLET BY MOUTH ONCE DAILY (Patient taking differently: Take 20 mg by mouth daily. ) 90 tablet 1  . dipyridamole-aspirin (  AGGRENOX) 200-25 MG 12hr capsule Take 1 capsule by mouth 2 (two) times daily.    . famotidine (PEPCID) 40 MG tablet Take 1 tablet (40 mg total) by mouth daily. 30 tablet 3  . glipiZIDE (GLIPIZIDE XL) 10 MG 24 hr tablet TAKE 1 TABLET BY MOUTH ONCE DAILY WITH BREAKFAST (Patient taking differently: Take 10 mg by mouth daily with breakfast. ) 90 tablet 3  . metFORMIN (GLUCOPHAGE) 1000 MG tablet Take 1 tablet (1,000 mg total) by mouth 2 (two) times daily with a meal. 180 tablet 3  . metoprolol succinate (TOPROL-XL) 100 MG 24 hr tablet TAKE 1 TABLET BY MOUTH ONCE DAILY TAKE  WITH  OR  IMMEDIATELY  FOLLOWING  A  MEAL (Patient taking differently: Take 100 mg by mouth daily. ) 90 tablet 3  . PARoxetine (PAXIL) 40 MG tablet Take 1 tablet (40 mg total) by mouth daily. 90 tablet 3  . amLODipine (NORVASC) 5 MG tablet Take 1 tablet (5 mg total) by mouth daily. (Patient not taking: Reported on 08/24/2018) 90 tablet 3   No current facility-administered medications on file prior to visit.     Review of Systems  Constitutional: Positive for fatigue. Negative for activity change, appetite change, fever and unexpected weight change.  HENT: Negative for congestion, ear pain, rhinorrhea, sinus pressure and sore throat.   Eyes: Negative for pain, redness and visual disturbance.  Respiratory: Negative for cough, shortness of breath and wheezing.   Cardiovascular: Negative for chest pain and palpitations.  Gastrointestinal: Negative for abdominal pain, blood in stool, constipation and diarrhea.  Endocrine: Negative for polydipsia and polyuria.  Genitourinary: Negative for dysuria, frequency and  urgency.  Musculoskeletal: Negative for arthralgias, back pain and myalgias.       Aches and pains   Skin: Negative for pallor and rash.  Allergic/Immunologic: Negative for environmental allergies.  Neurological: Negative for dizziness, syncope and headaches.       Poor balance  Hematological: Negative for adenopathy. Does not bruise/bleed easily.  Psychiatric/Behavioral: Negative for decreased concentration and dysphoric mood. The patient is not nervous/anxious.        Objective:   Physical Exam Constitutional:      General: She is not in acute distress.    Appearance: Normal appearance. She is well-developed. She is not ill-appearing.     Comments: Slim   HENT:     Head: Normocephalic and atraumatic.     Mouth/Throat:     Mouth: Mucous membranes are moist.     Pharynx: Oropharynx is clear.  Eyes:     Conjunctiva/sclera: Conjunctivae normal.     Pupils: Pupils are equal, round, and reactive to light.  Neck:     Musculoskeletal: Normal range of motion and neck supple.     Thyroid: No thyromegaly.     Vascular: No carotid bruit or JVD.     Comments: No carotid bruits  Cardiovascular:     Rate and Rhythm: Normal rate and regular rhythm.     Pulses: Normal pulses.     Heart sounds: Normal heart sounds. No gallop.   Pulmonary:     Effort: Pulmonary effort is normal. No respiratory distress.     Breath sounds: Normal breath sounds. No wheezing or rales.  Abdominal:     General: Bowel sounds are normal. There is no distension or abdominal bruit.     Palpations: Abdomen is soft. There is no mass.     Tenderness: There is no abdominal tenderness.  Musculoskeletal:  Right lower leg: No edema.     Left lower leg: No edema.  Lymphadenopathy:     Cervical: No cervical adenopathy.  Skin:    General: Skin is warm and dry.     Capillary Refill: Capillary refill takes less than 2 seconds.     Findings: No rash.     Comments: Fair   Neurological:     Mental Status: She is  alert. Mental status is at baseline.     Deep Tendon Reflexes: Reflexes are normal and symmetric.  Psychiatric:        Mood and Affect: Mood normal.           Assessment & Plan:   Problem List Items Addressed This Visit      Cardiovascular and Mediastinum   HTN (hypertension)    bp in fair control at this time  BP Readings from Last 1 Encounters:  08/24/18 124/66   No changes needed Most recent labs reviewed  Disc lifstyle change with low sodium diet and exercise        Peripheral vascular disease due to secondary diabetes mellitus ([place])    On statin and aggrenox Mild carotid disease  Yearly f/u with cardiology          Endocrine   Type 2 diabetes mellitus ([place]) - Primary    Excellent control with metformin and glipizide  Also better diet  Lab Results  Component Value Date   HGBA1C 5.8 08/18/2018   No hypoglycemia Pt will schedule annual eye exam  Good foot care microalbumin today  On statin  F/u 6 mo      Relevant Orders   Microalbumin / creatinine urine ratio   Diabetic retinopathy ([place])    Due for eye exam Sent for last report  Pt wants to schedule her own exam  No reported vision changes       Combined hyperlipidemia associated with type 2 diabetes mellitus ([place])    Disc goals for lipids and reasons to control them Rev last labs with pt Rev low sat fat diet in detail  Continues atorvastatin with good LDL control  Recommend exercise/omega 3 for stable low HDL        Genitourinary   Chronic kidney disease (CKD), stage II (mild)    Stable labs Enc good fluid intake  Avoid nsaids         Other   Protein calorie malnutrition ([facility])    Pt has lost 4 more lb (poss from stress) DM in good control  Disc ways to enc more protein including supplements

## 2018-08-25 LAB — MICROALBUMIN / CREATININE URINE RATIO
Creatinine,U: 112.2 mg/dL
MICROALB UR: 8.4 mg/dL — AB (ref 0.0–1.9)
Microalb Creat Ratio: 7.5 mg/g (ref 0.0–30.0)

## 2018-09-23 ENCOUNTER — Other Ambulatory Visit: Payer: Self-pay | Admitting: Family Medicine

## 2018-09-24 NOTE — Telephone Encounter (Signed)
CPE scheduled for 02/22/19, last put in as no print so not sure when it was last filled but it was filled by a Dr. Jacinta Shoe, please advise

## 2018-10-02 ENCOUNTER — Other Ambulatory Visit: Payer: Self-pay | Admitting: Family Medicine

## 2018-11-25 ENCOUNTER — Other Ambulatory Visit: Payer: Self-pay | Admitting: Family Medicine

## 2018-11-25 NOTE — Telephone Encounter (Signed)
Hygroton was d/c on med list when she was d/c from hospital in 05/2018, ? If pt should be on med, please advise

## 2018-11-25 NOTE — Telephone Encounter (Signed)
She should not be on the chlorthalidone -decline it  Ok to refill the other med Thanks

## 2018-12-04 ENCOUNTER — Other Ambulatory Visit: Payer: Self-pay | Admitting: Family Medicine

## 2018-12-11 ENCOUNTER — Telehealth: Payer: Self-pay

## 2018-12-11 NOTE — Telephone Encounter (Signed)
Pt request cb why lasix 20 mg has not been refilled.pt said Kidney specialist changed diuretic to Lasix 20 mg about 1 yr ago and Dr Glori Bickers per pt has been filling Lasix for pt; I cannot find lasix 20 mg on current or hx med list. I spoke with Myriam Jacobson at [facility] [address] and he could not find where pt had been given Lasix(furosemide) 20 mg.  I spoke with pt to see if she had the bottle to read the name of the med to me.pt said she had thrown the bottle in recyclable bin. Pt did not want me to hold until she found the bottle but pt will cb when finds the bottle.FYI to [place] CMA.

## 2018-12-11 NOTE — Telephone Encounter (Signed)
I looked in her chart and see it 7/19 from Domenic Polite MD  I do not see where I filled it  Thanks  Will wait to hear back from her

## 2018-12-11 NOTE — Telephone Encounter (Signed)
Looks like it was stopped in November at d/c from the hospital (due to her kidney function/dehydration)  So she has continued it?  Was she taking it when she had her labs done in feb?

## 2018-12-11 NOTE — Telephone Encounter (Signed)
Pt called back her husband found the bottle it was not lasix it was chlorthalidone 25 mg taking 1 daily; last refilled 08/20/2018 for # 90 ; last refill on hx list # 90 x 3 on 08/27/17. Pt said she has been out of med 1 wk and no one has ever stopped that med.Please advise.

## 2018-12-13 ENCOUNTER — Other Ambulatory Visit: Payer: Self-pay | Admitting: Family Medicine

## 2018-12-14 NOTE — Telephone Encounter (Signed)
She can keep taking it then Please give refills for 6 mo

## 2018-12-14 NOTE — Telephone Encounter (Signed)
Pt notified and med refilled

## 2018-12-14 NOTE — Telephone Encounter (Addendum)
Spoke with patient.    She states that she never got the message to stop the chlorthalidone in November from the hospital.   She has continued to take it daily and was on it in February when you last checked her labs.   Please advise whether to continue or if anything further is needed at this point.

## 2019-01-31 ENCOUNTER — Other Ambulatory Visit: Payer: Self-pay | Admitting: Family Medicine

## 2019-02-15 ENCOUNTER — Encounter (HOSPITAL_COMMUNITY): Payer: Self-pay | Admitting: Emergency Medicine

## 2019-02-15 ENCOUNTER — Inpatient Hospital Stay (HOSPITAL_COMMUNITY)
Admission: EM | Admit: 2019-02-15 | Discharge: 2019-02-17 | DRG: 287 | Disposition: A | Discharge status: Home / Self Care | Payer: Medicare Other | Attending: Cardiovascular Disease | Admitting: Cardiovascular Disease

## 2019-02-15 ENCOUNTER — Emergency Department (HOSPITAL_COMMUNITY): Payer: Medicare Other

## 2019-02-15 DIAGNOSIS — Z20828 Contact with and (suspected) exposure to other viral communicable diseases: Secondary | ICD-10-CM | POA: Diagnosis present

## 2019-02-15 DIAGNOSIS — R778 Other specified abnormalities of plasma proteins: Secondary | ICD-10-CM

## 2019-02-15 DIAGNOSIS — I471 Supraventricular tachycardia: Secondary | ICD-10-CM | POA: Diagnosis not present

## 2019-02-15 DIAGNOSIS — R079 Chest pain, unspecified: Secondary | ICD-10-CM | POA: Diagnosis not present

## 2019-02-15 DIAGNOSIS — I214 Non-ST elevation (NSTEMI) myocardial infarction: Secondary | ICD-10-CM | POA: Diagnosis not present

## 2019-02-15 DIAGNOSIS — I2510 Atherosclerotic heart disease of native coronary artery without angina pectoris: Secondary | ICD-10-CM | POA: Diagnosis not present

## 2019-02-15 DIAGNOSIS — Z8249 Family history of ischemic heart disease and other diseases of the circulatory system: Secondary | ICD-10-CM | POA: Diagnosis not present

## 2019-02-15 DIAGNOSIS — Z8673 Personal history of transient ischemic attack (TIA), and cerebral infarction without residual deficits: Secondary | ICD-10-CM

## 2019-02-15 DIAGNOSIS — Z833 Family history of diabetes mellitus: Secondary | ICD-10-CM

## 2019-02-15 DIAGNOSIS — I129 Hypertensive chronic kidney disease with stage 1 through stage 4 chronic kidney disease, or unspecified chronic kidney disease: Secondary | ICD-10-CM | POA: Diagnosis present

## 2019-02-15 DIAGNOSIS — I248 Other forms of acute ischemic heart disease: Secondary | ICD-10-CM | POA: Diagnosis not present

## 2019-02-15 DIAGNOSIS — E785 Hyperlipidemia, unspecified: Secondary | ICD-10-CM | POA: Diagnosis present

## 2019-02-15 DIAGNOSIS — R112 Nausea with vomiting, unspecified: Secondary | ICD-10-CM | POA: Diagnosis not present

## 2019-02-15 DIAGNOSIS — Z7984 Long term (current) use of oral hypoglycemic drugs: Secondary | ICD-10-CM

## 2019-02-15 DIAGNOSIS — I25119 Atherosclerotic heart disease of native coronary artery with unspecified angina pectoris: Secondary | ICD-10-CM | POA: Diagnosis not present

## 2019-02-15 DIAGNOSIS — Z7982 Long term (current) use of aspirin: Secondary | ICD-10-CM | POA: Diagnosis not present

## 2019-02-15 DIAGNOSIS — Z955 Presence of coronary angioplasty implant and graft: Secondary | ICD-10-CM | POA: Diagnosis not present

## 2019-02-15 DIAGNOSIS — E119 Type 2 diabetes mellitus without complications: Secondary | ICD-10-CM

## 2019-02-15 DIAGNOSIS — Z79899 Other long term (current) drug therapy: Secondary | ICD-10-CM

## 2019-02-15 DIAGNOSIS — N182 Chronic kidney disease, stage 2 (mild): Secondary | ICD-10-CM | POA: Diagnosis present

## 2019-02-15 DIAGNOSIS — Z96642 Presence of left artificial hip joint: Secondary | ICD-10-CM | POA: Diagnosis not present

## 2019-02-15 DIAGNOSIS — R000 Tachycardia, unspecified: Secondary | ICD-10-CM | POA: Diagnosis not present

## 2019-02-15 DIAGNOSIS — I10 Essential (primary) hypertension: Secondary | ICD-10-CM | POA: Diagnosis present

## 2019-02-15 DIAGNOSIS — E1122 Type 2 diabetes mellitus with diabetic chronic kidney disease: Secondary | ICD-10-CM | POA: Diagnosis not present

## 2019-02-15 LAB — CBC
HCT: 32.9 % — ABNORMAL LOW (ref 36.0–46.0)
Hemoglobin: 11 g/dL — ABNORMAL LOW (ref 12.0–15.0)
MCH: 31.4 pg (ref 26.0–34.0)
MCHC: 33.4 g/dL (ref 30.0–36.0)
MCV: 94 fL (ref 80.0–100.0)
Platelets: 224 10*3/uL (ref 150–400)
RBC: 3.5 MIL/uL — ABNORMAL LOW (ref 3.87–5.11)
RDW: 12 % (ref 11.5–15.5)
WBC: 7.9 10*3/uL (ref 4.0–10.5)
nRBC: 0 % (ref 0.0–0.2)

## 2019-02-15 LAB — BASIC METABOLIC PANEL
Anion gap: 11 (ref 5–15)
BUN: 21 mg/dL (ref 8–23)
CO2: 25 mmol/L (ref 22–32)
Calcium: 9.7 mg/dL (ref 8.9–10.3)
Chloride: 101 mmol/L (ref 98–111)
Creatinine, Ser: 1.04 mg/dL — ABNORMAL HIGH (ref 0.44–1.00)
GFR calc Af Amer: 58 mL/min — ABNORMAL LOW (ref >60)
GFR calc non Af Amer: 50 mL/min — ABNORMAL LOW (ref >60)
Glucose, Bld: 82 mg/dL (ref 70–99)
Potassium: 4.2 mmol/L (ref 3.5–5.1)
Sodium: 137 mmol/L (ref 135–145)

## 2019-02-15 LAB — TROPONIN I (HIGH SENSITIVITY)
Troponin I (High Sensitivity): 975 ng/L (ref <18)
Troponin I (High Sensitivity): 3935 ng/L (ref <18)

## 2019-02-15 LAB — SARS CORONAVIRUS 2 BY RT PCR (HOSPITAL ORDER, PERFORMED IN ~~LOC~~ HOSPITAL LAB): SARS Coronavirus 2: NEGATIVE

## 2019-02-15 LAB — MAGNESIUM: Magnesium: 1.6 mg/dL — ABNORMAL LOW (ref 1.7–2.4)

## 2019-02-15 LAB — MRSA PCR SCREENING: MRSA by PCR: NEGATIVE

## 2019-02-15 LAB — GLUCOSE, CAPILLARY: Glucose-Capillary: 70 mg/dL (ref 70–99)

## 2019-02-15 MED ORDER — SODIUM CHLORIDE 0.9 % WEIGHT BASED INFUSION
3.0000 mL/kg/h | INTRAVENOUS | Status: DC
  Administered 2019-02-16: 04:00:00 3 mL/kg/h via INTRAVENOUS

## 2019-02-15 MED ORDER — SODIUM CHLORIDE 0.9% FLUSH: 3.0000 mL | Freq: Two times a day (BID) | INTRAVENOUS | Status: DC

## 2019-02-15 MED ORDER — NITROGLYCERIN 0.4 MG SL SUBL: 0.4000 mg | SUBLINGUAL_TABLET | SUBLINGUAL | Status: DC | PRN

## 2019-02-15 MED ORDER — ONDANSETRON HCL 4 MG/2ML IJ SOLN
4.0000 mg | Freq: Four times a day (QID) | INTRAMUSCULAR | Status: DC | PRN
  Administered 2019-02-16 – 2019-02-17 (×3): 4 mg via INTRAVENOUS
  Filled 2019-02-15 (×3): qty 2

## 2019-02-15 MED ORDER — ASPIRIN 81 MG PO CHEW
81.0000 mg | CHEWABLE_TABLET | ORAL | Status: AC
  Administered 2019-02-16: 06:00:00 81 mg via ORAL
  Filled 2019-02-15: qty 1

## 2019-02-15 MED ORDER — ACETAMINOPHEN 325 MG PO TABS
650.0000 mg | ORAL_TABLET | ORAL | Status: DC | PRN
  Administered 2019-02-16: 08:00:00 650 mg via ORAL
  Filled 2019-02-15: qty 2

## 2019-02-15 MED ORDER — HEPARIN (PORCINE) 25000 UT/250ML-% IV SOLN
650.0000 [IU]/h | INTRAVENOUS | Status: DC
  Administered 2019-02-15: 18:00:00 550 [IU]/h via INTRAVENOUS
  Filled 2019-02-15: qty 250

## 2019-02-15 MED ORDER — METOPROLOL SUCCINATE ER 100 MG PO TB24
100.0000 mg | ORAL_TABLET | Freq: Every day | ORAL | Status: DC
  Administered 2019-02-15 – 2019-02-17 (×3): 100 mg via ORAL
  Filled 2019-02-15 (×3): qty 1

## 2019-02-15 MED ORDER — SODIUM CHLORIDE 0.9% FLUSH: 3.0000 mL | Freq: Once | INTRAVENOUS | Status: DC

## 2019-02-15 MED ORDER — FAMOTIDINE 40 MG PO TABS
40.0000 mg | ORAL_TABLET | Freq: Every day | ORAL | Status: DC
  Administered 2019-02-17: 40 mg via ORAL
  Filled 2019-02-15 (×2): qty 1

## 2019-02-15 MED ORDER — ASPIRIN EC 81 MG PO TBEC
81.0000 mg | DELAYED_RELEASE_TABLET | Freq: Every day | ORAL | Status: DC
  Administered 2019-02-16 – 2019-02-17 (×2): 81 mg via ORAL
  Filled 2019-02-15 (×3): qty 1

## 2019-02-15 MED ORDER — ATORVASTATIN CALCIUM 10 MG PO TABS
20.0000 mg | ORAL_TABLET | Freq: Every day | ORAL | Status: DC
  Administered 2019-02-15 – 2019-02-16 (×2): 20 mg via ORAL
  Filled 2019-02-15 (×2): qty 2

## 2019-02-15 MED ORDER — ASPIRIN-DIPYRIDAMOLE ER 25-200 MG PO CP12
1.0000 | ORAL_CAPSULE | Freq: Two times a day (BID) | ORAL | Status: DC
  Administered 2019-02-15 – 2019-02-17 (×3): 1 via ORAL
  Filled 2019-02-15 (×4): qty 1

## 2019-02-15 MED ORDER — SODIUM CHLORIDE 0.9 % WEIGHT BASED INFUSION
1.0000 mL/kg/h | INTRAVENOUS | Status: DC
  Administered 2019-02-16: 05:00:00 1 mL/kg/h via INTRAVENOUS

## 2019-02-15 MED ORDER — INSULIN ASPART 100 UNIT/ML ~~LOC~~ SOLN
0.0000 [IU] | Freq: Three times a day (TID) | SUBCUTANEOUS | Status: DC
  Administered 2019-02-17: 07:00:00 1 [IU] via SUBCUTANEOUS

## 2019-02-15 MED ORDER — SODIUM CHLORIDE 0.9% FLUSH
3.0000 mL | Freq: Two times a day (BID) | INTRAVENOUS | Status: DC
  Administered 2019-02-15 – 2019-02-16 (×2): 3 mL via INTRAVENOUS

## 2019-02-15 MED ORDER — PAROXETINE HCL 20 MG PO TABS
40.0000 mg | ORAL_TABLET | Freq: Every day | ORAL | Status: DC
  Administered 2019-02-15 – 2019-02-16 (×2): 40 mg via ORAL
  Filled 2019-02-15 (×2): qty 2

## 2019-02-15 MED ORDER — SODIUM CHLORIDE 0.9% FLUSH: 3.0000 mL | INTRAVENOUS | Status: DC | PRN

## 2019-02-15 MED ORDER — SODIUM CHLORIDE 0.9 % IV SOLN: 250.0000 mL | INTRAVENOUS | Status: DC | PRN

## 2019-02-15 MED ORDER — HEPARIN BOLUS VIA INFUSION
2800.0000 [IU] | Freq: Once | INTRAVENOUS | Status: AC
  Administered 2019-02-15: 2800 [IU] via INTRAVENOUS
  Filled 2019-02-15: qty 2800

## 2019-02-15 NOTE — H&P (Signed)
Physician History and Physical     Patient ID: Stephanie [name] MRN: [id number] DOB/AGE: 1934/05/23 85 y.o. Admit date: 02/15/2019  Primary Care Physician: Tower, Wynelle Fanny, MD Primary Cardiologist: Rockey Situ  Active Problems:   * No active hospital problems. *   HPI:  85 y.o. history of CVA distant on Aggrenox, CAD with distant stent to LAD/D1 in 2005.  CRF;s age , HLD and HTN.  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 Husband with her is a patient of Dr Fletcher Anon. Was cleaning / bird feeders today at home and had sudden onset of rapid palpitations with SSCP. No pre syncope. EMS noted patient to be in rapid SVT rate 196.  Converted to NSR with 6 mg of adenosine and chest pressure slowly eased off. Currently pain free HS Troponin positive 975 Post conversion ECG no acute ST changes. She ran out of her Toprol a week ago and has not taken. Prior to this episode had not been having angina. EF normal by echo 11/15/16  And carotid with plaque no stenosis May 2018.  Has been maintained on Aggrenox since CVA   Review of systems complete and found to be negative unless listed above   Past Medical History:  Diagnosis Date  . Allergy    allergic rhinitis  . Anxiety   . Arthritis    osteoarthritis  . Degenerative disk disease    in neck  . Diabetes mellitus    type II  . Fracture of femoral neck, left ([place]) 01/21/2018  . Hyperlipidemia   . Hypertension   . Hypoaldosteronism ([place]) 6/12   from DM causing hyperkalemia   . Osteopenia   . Periodontal disease   . Peripheral vascular disease ([place])   . Transient cerebral ischemia 11/06/2016    Family History  Problem Relation Age of Onset  . Cancer Mother        vaginal CA in situ  . Hypertension Mother   . Heart disease Mother        CAD and MI  . Heart disease Father        CAD  . Diabetes Father   . Heart disease Son        congenital valve problem    Social History   Socioeconomic History  . Marital  status: Married    Spouse name: Not on file  . Number of children: 3  . Years of education: Not on file  . Highest education level: Not on file  Occupational History  . Not on file  Social Needs  . Financial resource strain: Not on file  . Food insecurity    Worry: Not on file    Inability: Not on file  . Transportation needs    Medical: Not on file    Non-medical: Not on file  Tobacco Use  . Smoking status: Never Smoker  . Smokeless tobacco: Never Used  Substance and Sexual Activity  . Alcohol use: No    Alcohol/week: 0.0 standard drinks  . Drug use: No  . Sexual activity: Not on file  Lifestyle  . Physical activity    Days per week: Not on file    Minutes per session: Not on file  . Stress: Not on file  Relationships  . Social Herbalist on phone: Not on file    Gets together: Not on file    Attends religious service: Not on file    Active member of club  or organization: Not on file    Attends meetings of clubs or organizations: Not on file    Relationship status: Not on file  . Intimate partner violence    Fear of current or ex partner: Not on file    Emotionally abused: Not on file    Physically abused: Not on file    Forced sexual activity: Not on file  Other Topics Concern  . Not on file  Social History Narrative  . Not on file    Past Surgical History:  Procedure Laterality Date  . CARDIAC CATHETERIZATION     x2 STENT  . CHOLECYSTECTOMY    . ESOPHAGOGASTRODUODENOSCOPY  03/2004   gastritis by biopsy  . GUM SURGERY  06/2010   laser surgery for gums  . HIP ARTHROPLASTY Left 01/21/2018   Procedure: ARTHROPLASTY BIPOLAR HIP (HEMIARTHROPLASTY);  Surgeon: Marchia Bond, MD;  Location: [facility];  Service: Orthopedics;  Laterality: Left;  . IR RADIOLOGIST EVAL & MGMT  06/17/2018  . MM BREAST STEREO BX*L*R/S  12/12   normal  . TONSILLECTOMY    . TRANSTHORACIC ECHOCARDIOGRAM  11/2016   EF 55-60%, grade 1 DD with elevated LV end diastolic filling  pressures, mildly dilated LA, mild TR and PR.   . TUBAL LIGATION       (Not in a hospital admission)   Physical Exam: Blood pressure 140/82, pulse 93, temperature 98.7 F (37.1 C), temperature source Oral, resp. rate 14, height 5\' 1"  (1.549 m), weight 47.6 kg, SpO2 98 %.    Affect appropriate Thin frail female  HEENT: normal Neck supple with no adenopathy JVP normal no bruits no thyromegaly Lungs clear with no wheezing and good diaphragmatic motion Heart:  S1/S2 no murmur, no rub, gallop or click PMI normal Abdomen: benighn, BS positve, no tenderness, no AAA no bruit.  No HSM or HJR Distal pulses intact with no bruits No edema Neuro non-focal Skin warm and dry No muscular weakness  No current facility-administered medications on file prior to encounter.    Current Outpatient Medications on File Prior to Encounter  Medication Sig Dispense Refill  . aspirin EC 325 MG tablet Take 650 mg by mouth as needed (for onset of chest pain).    Marland Kitchen atorvastatin (LIPITOR) 20 MG tablet Take 1 tablet by mouth once daily (Patient taking differently: Take 20 mg by mouth at bedtime. ) 90 tablet 1  . chlorthalidone (HYGROTON) 25 MG tablet Take 1 tablet by mouth once daily (Patient taking differently: Take 25 mg by mouth daily. ) 90 tablet 1  . dipyridamole-aspirin (AGGRENOX) 200-25 MG 12hr capsule Take 1 capsule by mouth twice daily (Patient taking differently: Take 1 capsule by mouth 2 (two) times daily. ) 60 capsule 11  . famotidine (PEPCID) 40 MG tablet Take 1 tablet (40 mg total) by mouth daily. (Patient taking differently: Take 40 mg by mouth See admin instructions. Take 40 mg by mouth one to two times a day) 30 tablet 3  . glipiZIDE (GLIPIZIDE XL) 10 MG 24 hr tablet TAKE 1 TABLET BY MOUTH ONCE DAILY WITH BREAKFAST (Patient taking differently: Take 10 mg by mouth daily with breakfast. ) 90 tablet 3  . metFORMIN (GLUCOPHAGE) 1000 MG tablet Take 1 tablet (1,000 mg total) by mouth 2 (two) times  daily with a meal. 180 tablet 0  . metoprolol succinate (TOPROL-XL) 100 MG 24 hr tablet TAKE 1 TABLET BY MOUTH ONCE DAILY TAKE  WITH  OR  IMMEDIATELY  FOLLOWING  A  MEAL (Patient  taking differently: Take 100 mg by mouth daily. ) 90 tablet 3  . PARoxetine (PAXIL) 40 MG tablet Take 1 tablet by mouth daily (Patient taking differently: Take 40 mg by mouth at bedtime. ) 90 tablet 1  . amLODipine (NORVASC) 5 MG tablet Take 1 tablet (5 mg total) by mouth daily. (Patient not taking: Reported on 02/15/2019) 90 tablet 3    Labs:   Lab Results  Component Value Date   WBC 7.9 02/15/2019   HGB 11.0 (L) 02/15/2019   HCT 32.9 (L) 02/15/2019   MCV 94.0 02/15/2019   PLT 224 02/15/2019    Recent Labs  Lab 02/15/19 1601  NA 137  K 4.2  CL 101  CO2 25  BUN 21  CREATININE 1.04*  CALCIUM 9.7  GLUCOSE 82   Lab Results  Component Value Date   CKTOTAL 234 01/22/2018     Lab Results  Component Value Date   CHOL 121 08/18/2018   CHOL 107 02/09/2018   CHOL 107 08/27/2017   Lab Results  Component Value Date   HDL 35.60 (L) 08/18/2018   HDL 34.60 (L) 02/09/2018   HDL 36.90 (L) 08/27/2017   Lab Results  Component Value Date   LDLCALC 52 08/18/2018   LDLCALC 38 02/09/2018   LDLCALC 37 08/27/2017   Lab Results  Component Value Date   TRIG 167.0 (H) 08/18/2018   TRIG 176.0 (H) 02/09/2018   TRIG 166.0 (H) 08/27/2017   Lab Results  Component Value Date   CHOLHDL 3 08/18/2018   CHOLHDL 3 02/09/2018   CHOLHDL 3 08/27/2017   Lab Results  Component Value Date   LDLDIRECT 54.0 09/04/2015   LDLDIRECT 43.0 02/28/2015   LDLDIRECT 59.0 08/23/2014       Radiology: Dg Chest 2 View  Result Date: 02/15/2019 CLINICAL DATA:  Chest pain with SVT EXAM: CHEST - 2 VIEW COMPARISON:  06/04/2018, 11/11/2017, 09/25/2009 FINDINGS: Left lower calcifications. No acute airspace disease or effusion mild asymmetric opacity in the left apex. Stable cardiomediastinal silhouette with aortic atherosclerosis. No  pneumothorax. Stable ovoid calcification in the left upper quadrant. IMPRESSION: No active cardiopulmonary disease. Summation shadow versus nodule in the left lung apex. Further evaluation with chest CT could be obtained. Electronically Signed   By: Donavan Foil M.D.   On: 02/15/2019 16:57    EKG: Narrow complex tachycardia SVT 196  Post conversion NSR no acute ST changes   ASSESSMENT AND PLAN:  SVT:  Resolved with adenosine Resume Toprol at 100 mg dose which she had not been taking Monitor on telemetry   CAD: Distant history of stent 2005 to LAD/D1. Elevated troponin with angina during SVT. Heparin add 81 mg ASA to Aggrenox.  Discussed options and favor cath in am. Risks including stroke bleeding, MI and need for emergency surgery discussed with her and husband. Willing to proceed. Would like Dr Fletcher Anon to do if possible. Tren toponins   HLD:  On statin   DM:  Hold glipizide in am as well as diuretic pre cath   Abnormal CXR:  Likely summation artifact will order PA and Lateral CXR rather than CT for starters    Signed: Collier Salina Nishan8/09/2018, 5:59 PM

## 2019-02-15 NOTE — Progress Notes (Signed)
Asked to assist with admit orders per d/w MD. Will hold chlorthalidone in prep for cath (decreased CrCl), along with oral hypoglycemics (glipizide +  Metformin) in anticipation of NPO after midnight. SSI ordered. Continue metoprolol, first dose ordered in ER at request of Dr. Johnsie Cancel. Pre-cath orders written. She is on the board for Dr. Fletcher Anon tomorrow. PA/Lat CXR was already done therefore will need consideration of CT after cath for abnormality recognized.  Delissa Silba PA-C

## 2019-02-15 NOTE — ED Notes (Signed)
Patient transported to X-ray 

## 2019-02-15 NOTE — ED Notes (Signed)
ED TO INPATIENT HANDOFF REPORT  ED Nurse Name and Phone #:  [phone]  S Name/Age/Gender Stephanie [name] 85 y.o. female Room/Bed: 021C/021C  Code Status   Code Status: Prior  Home/SNF/Other Home Patient oriented to: self, place, time and situation Is this baseline? Yes   Triage Complete: Triage complete  Chief Complaint Heart Problems  Triage Note Pt with EMS with c/o chest pain and tachycardia when she was changing her bird feeder. Initial Rate 190-200 currently 90-100 after 6 of adenosine. Denies chest pain or SOB, positive for nausea declines zofran.  Hx of cardiac stents    Allergies Allergies  Allergen Reactions  . Ace Inhibitors Other (See Comments)    Chest pain  . Amlodipine Besy-Benazepril Hcl Other (See Comments)    Chest pain  . Clopidogrel Bisulfate Other (See Comments)    GI upset  . Erythromycin Other (See Comments)    GI upset  . Tape Other (See Comments)    Causes soreness (of the skin)    Level of Care/Admitting Diagnosis ED Disposition    ED Disposition Condition Comment   Admit  Hospital Area: [facility] [[id number]]  Level of Care: Progressive [102]  Covid Evaluation: Asymptomatic Screening Protocol (No Symptoms)  Diagnosis: SVT (supraventricular tachycardia) ([place]) [710626]  Admitting Physician: Carron Brazen  Attending Physician: Josue Hector [[id number]  PT Class (Do Not Modify): Observation [104]  PT Acc Code (Do Not Modify): Observation [10022]       B Medical/Surgery History Past Medical History:  Diagnosis Date  . Allergy    allergic rhinitis  . Anxiety   . Arthritis    osteoarthritis  . Degenerative disk disease    in neck  . Diabetes mellitus    type II  . Fracture of femoral neck, left ([place]) 01/21/2018  . Hyperlipidemia   . Hypertension   . Hypoaldosteronism ([place]) 6/12   from DM causing hyperkalemia   . Osteopenia   . Periodontal disease   . Peripheral vascular disease ([place])   . Transient  cerebral ischemia 11/06/2016   Past Surgical History:  Procedure Laterality Date  . CARDIAC CATHETERIZATION     x2 STENT  . CHOLECYSTECTOMY    . ESOPHAGOGASTRODUODENOSCOPY  03/2004   gastritis by biopsy  . GUM SURGERY  06/2010   laser surgery for gums  . HIP ARTHROPLASTY Left 01/21/2018   Procedure: ARTHROPLASTY BIPOLAR HIP (HEMIARTHROPLASTY);  Surgeon: Marchia Bond, MD;  Location: [facility];  Service: Orthopedics;  Laterality: Left;  . IR RADIOLOGIST EVAL & MGMT  06/17/2018  . MM BREAST STEREO BX*L*R/S  12/12   normal  . TONSILLECTOMY    . TRANSTHORACIC ECHOCARDIOGRAM  11/2016   EF 55-60%, grade 1 DD with elevated LV end diastolic filling pressures, mildly dilated LA, mild TR and PR.   . TUBAL LIGATION       A IV Location/Drains/Wounds Patient Lines/Drains/Airways Status   Active Line/Drains/Airways    Name:   Placement date:   Placement time:   Site:   Days:   Peripheral IV 02/15/19 Left Antecubital   02/15/19    1557    Antecubital   less than 1   Peripheral IV 02/15/19 Right Wrist   02/15/19    1815    Wrist   less than 1   Closed System Drain 1 Left Buttock Bulb (JP) 10 Fr.   06/05/18    1607    Buttock   255   Incision (Closed) 01/21/18 Hip Left  01/21/18    1737     390   Wound / Incision (Open or Dehisced) 01/21/18 Laceration Elbow Left;Posterior   01/21/18    2100    Elbow   390          Intake/Output Last 24 hours No intake or output data in the 24 hours ending 02/15/19 2007  Labs/Imaging Results for orders placed or performed during the hospital encounter of 02/15/19 (from the past 48 hour(s))  Basic metabolic panel     Status: Abnormal   Collection Time: 02/15/19  4:01 PM  Result Value Ref Range   Sodium 137 135 - 145 mmol/L   Potassium 4.2 3.5 - 5.1 mmol/L   Chloride 101 98 - 111 mmol/L   CO2 25 22 - 32 mmol/L   Glucose, Bld 82 70 - 99 mg/dL   BUN 21 8 - 23 mg/dL   Creatinine, Ser 1.04 (H) 0.44 - 1.00 mg/dL   Calcium 9.7 8.9 - 10.3 mg/dL   GFR calc non Af  Amer 50 (L) >60 mL/min   GFR calc Af Amer 58 (L) >60 mL/min   Anion gap 11 5 - 15    Comment: Performed at [facility], [place] [address] [place], [place] [address]  CBC     Status: Abnormal   Collection Time: 02/15/19  4:01 PM  Result Value Ref Range   WBC 7.9 4.0 - 10.5 K/uL   RBC 3.50 (L) 3.87 - 5.11 MIL/uL   Hemoglobin 11.0 (L) 12.0 - 15.0 g/dL   HCT 32.9 (L) 36.0 - 46.0 %   MCV 94.0 80.0 - 100.0 fL   MCH 31.4 26.0 - 34.0 pg   MCHC 33.4 30.0 - 36.0 g/dL   RDW 12.0 11.5 - 15.5 %   Platelets 224 150 - 400 K/uL   nRBC 0.0 0.0 - 0.2 %    Comment: Performed at [facility], [place] [address] [place], [place] [address]  Troponin I (High Sensitivity)     Status: Abnormal   Collection Time: 02/15/19  4:01 PM  Result Value Ref Range   Troponin I (High Sensitivity) 975 (HH) <18 ng/L    Comment: CRITICAL RESULT CALLED TO, READ BACK BY AND VERIFIED WITH: B LEAK,RN 1708 02/15/2019 D BRADLEY (NOTE) Elevated high sensitivity troponin I (hsTnI) values and significant  changes across serial measurements may suggest ACS but many other  chronic and acute conditions are known to elevate hsTnI results.  Refer to the Links section for chest pain algorithms and additional  guidance. Performed at [facility], [place] [address] [place], [place] [address]   SARS Coronavirus 2 [facility] order, Performed in [facility] hospital lab) Nasopharyngeal Nasopharyngeal Swab     Status: None   Collection Time: 02/15/19  5:53 PM   Specimen: Nasopharyngeal Swab  Result Value Ref Range   SARS Coronavirus 2 NEGATIVE NEGATIVE    Comment: (NOTE) If result is NEGATIVE SARS-CoV-2 target nucleic acids are NOT DETECTED. The SARS-CoV-2 RNA is generally detectable in upper and lower  respiratory specimens during the acute phase of infection. The lowest  concentration of SARS-CoV-2 viral copies this assay can detect is 250  copies / mL. A negative result does not preclude SARS-CoV-2 infection  and should  not be used as the sole basis for treatment or other  patient management decisions.  A negative result may occur with  improper specimen collection / handling, submission of specimen other  than nasopharyngeal swab, presence  of viral mutation(s) within the  areas targeted by this assay, and inadequate number of viral copies  (<250 copies / mL). A negative result must be combined with clinical  observations, patient history, and epidemiological information. If result is POSITIVE SARS-CoV-2 target nucleic acids are DETECTED. The SARS-CoV-2 RNA is generally detectable in upper and lower  respiratory specimens dur ing the acute phase of infection.  Positive  results are indicative of active infection with SARS-CoV-2.  Clinical  correlation with patient history and other diagnostic information is  necessary to determine patient infection status.  Positive results do  not rule out bacterial infection or co-infection with other viruses. If result is PRESUMPTIVE POSTIVE SARS-CoV-2 nucleic acids MAY BE PRESENT.   A presumptive positive result was obtained on the submitted specimen  and confirmed on repeat testing.  While 2019 novel coronavirus  (SARS-CoV-2) nucleic acids may be present in the submitted sample  additional confirmatory testing may be necessary for epidemiological  and / or clinical management purposes  to differentiate between  SARS-CoV-2 and other Sarbecovirus currently known to infect humans.  If clinically indicated additional testing with an alternate test  methodology [phone]) is advised. The SARS-CoV-2 RNA is generally  detectable in upper and lower respiratory sp ecimens during the acute  phase of infection. The expected result is Negative. Fact Sheet for Patients:  StrictlyIdeas.no Fact Sheet for Healthcare Providers: BankingDealers.co.za This test is not yet approved or cleared by the [place] FDA and has been  authorized for detection and/or diagnosis of SARS-CoV-2 by FDA under an Emergency Use Authorization (EUA).  This EUA will remain in effect (meaning this test can be used) for the duration of the COVID-19 declaration under Section 564(b)(1) of the Act, 21 U.S.C. section 360bbb-3(b)(1), unless the authorization is terminated or revoked sooner. Performed at [facility], [place] [address], [place], [place] [address]   Troponin I (High Sensitivity)     Status: Abnormal   Collection Time: 02/15/19  6:01 PM  Result Value Ref Range   Troponin I (High Sensitivity) 3,935 (HH) <18 ng/L    Comment: CRITICAL VALUE NOTED.  VALUE IS CONSISTENT WITH PREVIOUSLY REPORTED AND CALLED VALUE. (NOTE) Elevated high sensitivity troponin I (hsTnI) values and significant  changes across serial measurements may suggest ACS but many other  chronic and acute conditions are known to elevate hsTnI results.  Refer to the Links section for chest pain algorithms and additional  guidance. Performed at [facility], [place] [address] [place], [place] [address]    Dg Chest 2 View  Result Date: 02/15/2019 CLINICAL DATA:  Chest pain with SVT EXAM: CHEST - 2 VIEW COMPARISON:  06/04/2018, 11/11/2017, 09/25/2009 FINDINGS: Left lower calcifications. No acute airspace disease or effusion mild asymmetric opacity in the left apex. Stable cardiomediastinal silhouette with aortic atherosclerosis. No pneumothorax. Stable ovoid calcification in the left upper quadrant. IMPRESSION: No active cardiopulmonary disease. Summation shadow versus nodule in the left lung apex. Further evaluation with chest CT could be obtained. Electronically Signed   By: Donavan Foil M.D.   On: 02/15/2019 16:57    Pending Labs Unresulted Labs (From admission, onward)    Start     Ordered   02/17/19 0500  Heparin level (unfractionated)  Daily,   R     02/15/19 1740   02/16/19 0500  CBC  Daily,   R     02/15/19 1740   02/16/19 0200  Heparin level  (unfractionated)  Once-Timed,   STAT  02/15/19 1739   Signed and Held  TSH  Tomorrow morning,   R     Signed and Held   Signed and Held  Magnesium  Add-on,   R     Signed and Held   Signed and Held  T4, free  Tomorrow morning,   R     Signed and Held   Signed and Occupational hygienist morning,   R     Signed and Held   Signed and Held  CBC  Tomorrow morning,   R     Signed and Held   Signed and Held  Lipid panel  Tomorrow morning,   R     Signed and Held          Vitals/Pain Today's Vitals   02/15/19 1915 02/15/19 1930 02/15/19 1945 02/15/19 2000  BP: (!) 172/134 102/74 (!) 171/138 (!) 183/96  Pulse: (!) 57 80 75   Resp: (!) 21 (!) 23 19 17   Temp:      TempSrc:      SpO2: (!) 85% 99% 98%   Weight:      Height:      PainSc:        Isolation Precautions No active isolations  Medications Medications  sodium chloride flush (NS) 0.9 % injection 3 mL (3 mLs Intravenous Not Given 02/15/19 1805)  heparin ADULT infusion 100 units/mL (25000 units/252mL sodium chloride 0.45%) (550 Units/hr Intravenous New Bag/Given 02/15/19 1804)  metoprolol succinate (TOPROL-XL) 24 hr tablet 100 mg (100 mg Oral Given 02/15/19 1900)  heparin bolus via infusion 2,800 Units (2,800 Units Intravenous Bolus from Bag 02/15/19 1800)    Mobility walks Low fall risk   Focused Assessments Cardiac Assessment Handoff:  Cardiac Rhythm: Normal sinus rhythm Lab Results  Component Value Date   CKTOTAL 234 01/22/2018   No results found for: DDIMER Does the Patient currently have chest pain? No     R Recommendations: See Admitting Provider Note  Report given to:   Additional Notes:

## 2019-02-15 NOTE — ED Notes (Signed)
Dr. Julaine Fusi notified of critical Troponin by telephone.

## 2019-02-15 NOTE — ED Notes (Signed)
Patient back from  X-ray 

## 2019-02-15 NOTE — ED Provider Notes (Signed)
I saw and evaluated the patient, reviewed the resident's note and I agree with the findings and plan.  EKG: EKG Interpretation  Date/Time:  Monday February 15 2019 15:59:54 EDT Ventricular Rate:  89 PR Interval:    QRS Duration: 77 QT Interval:  405 QTC Calculation: 493 R Axis:   38 Text Interpretation:  Sinus rhythm Anterior infarct, old Borderline ST depression, anterolateral leads Confirmed by Lacretia Leigh ([id number]) on 02/15/2019 4:52:57 PM 85 year old female presents with acute onset of chest pain and tachycardia.  Patient found to be in SVT and treated with adenosine with cardioversion.  Rhythm strip was were viewed.  Patient is pain-free at this time.  Will place on heparin due to her elevated troponin which could be from demand ischemia and consult cardio admit to the hospital.   Lacretia Leigh, MD 02/15/19 1731

## 2019-02-15 NOTE — ED Provider Notes (Signed)
[facility] EMERGENCY DEPARTMENT Provider Note   CSN: [id number] Arrival date & time: 02/15/19  1552    History   Chief Complaint Chief Complaint  Patient presents with   Chest Pain   Tachycardia    HPI Stephanie [name] is a 85 y.o. female.     HPI Patient presents via EMS for evaluation of chest pressure, dull pain radiating to the back, and palpitations.  She had around 60 to 90 minutes total of symptoms.  Paramedics found supraventricular tachycardia at a rate around 200.  Gave 6 mg of adenosine and rate changed to regular sinus rhythm around 100.  Hemodynamically stable for transport.  On my assessment she reports that she was doing normal things around the house without any heavy lifting, use of stimulants, or exertion.  Had sudden onset that was constant.  Denies history of the same.  Lay down for several minutes without improvement.  Finally told her husband to call EMS.  Had one episode of emesis while waiting.  No recent illness or other constitutional symptoms.  EMS gave 325 aspirin.  Past Medical History:  Diagnosis Date   Allergy    allergic rhinitis   Anxiety    Arthritis    osteoarthritis   Degenerative disk disease    in neck   Diabetes mellitus    type II   Fracture of femoral neck, left ([place]) 01/21/2018   Hyperlipidemia    Hypertension    Hypoaldosteronism ([place]) 6/12   from DM causing hyperkalemia    Osteopenia    Periodontal disease    Peripheral vascular disease ([place])    Transient cerebral ischemia 11/06/2016    Patient Active Problem List   Diagnosis Date Noted   SVT (supraventricular tachycardia) ([place]) 02/15/2019   Colonic diverticular abscess 06/05/2018   Anemia 06/05/2018   Protein calorie malnutrition ([place]) 06/05/2018   Fecal occult blood test positive 03/12/2018   Closed fracture of left hip (HCC)    Chronic kidney disease (CKD), stage II (mild) 01/22/2018   Fracture of femoral neck, left ([place])  01/21/2018   Depression 01/21/2018   HLD (hyperlipidemia) 01/21/2018   GERD (gastroesophageal reflux disease) 01/21/2018   CAD (coronary artery disease) 01/21/2018   Subcapital fracture of left hip ([place]) 01/21/2018   Carotid stenosis, bilateral 11/20/2016   Transient cerebral ischemia 11/06/2016   Vitamin D deficiency 09/04/2015   Routine general medical examination at a health care facility 09/04/2015   Encounter for Medicare annual wellness exam 05/30/2014   Colon cancer screening 05/30/2014   History of fall 05/26/2013   Gynecological examination 81/44/8185   HELICOBACTER PYLORI INFECTION 12/19/2006   HSV 12/19/2006   Type 2 diabetes mellitus ([place]) 12/19/2006   Diabetic retinopathy ([place]) 12/19/2006   Combined hyperlipidemia associated with type 2 diabetes mellitus ([place]) 12/19/2006   ANXIETY 12/19/2006   HTN (hypertension) 12/19/2006   Coronary atherosclerosis 12/19/2006   Peripheral vascular disease due to secondary diabetes mellitus ([place]) 12/19/2006   ALLERGIC RHINITIS 12/19/2006   OSTEOARTHRITIS 12/19/2006   Osteopenia 12/19/2006    Past Surgical History:  Procedure Laterality Date   CARDIAC CATHETERIZATION     x2 STENT   CHOLECYSTECTOMY     ESOPHAGOGASTRODUODENOSCOPY  03/2004   gastritis by biopsy   GUM SURGERY  06/2010   laser surgery for gums   HIP ARTHROPLASTY Left 01/21/2018   Procedure: ARTHROPLASTY BIPOLAR HIP (HEMIARTHROPLASTY);  Surgeon: Marchia Bond, MD;  Location: [facility];  Service: Orthopedics;  Laterality: Left;   IR RADIOLOGIST  EVAL & MGMT  06/17/2018   MM BREAST STEREO BX*L*R/S  12/12   normal   TONSILLECTOMY     TRANSTHORACIC ECHOCARDIOGRAM  11/2016   EF 55-60%, grade 1 DD with elevated LV end diastolic filling pressures, mildly dilated LA, mild TR and PR.    TUBAL LIGATION       OB History   No obstetric history on file.      Home Medications    Prior to Admission medications   Medication Sig Start Date  End Date Taking? Authorizing Provider  aspirin EC 325 MG tablet Take 650 mg by mouth as needed (for onset of chest pain).   Yes [provider]  atorvastatin (LIPITOR) 20 MG tablet Take 1 tablet by mouth once daily Patient taking differently: Take 20 mg by mouth at bedtime.  11/26/18  Yes Tower, Wynelle Fanny, MD  chlorthalidone (HYGROTON) 25 MG tablet Take 1 tablet by mouth once daily Patient taking differently: Take 25 mg by mouth daily.  12/14/18  Yes Tower, Wynelle Fanny, MD  dipyridamole-aspirin (AGGRENOX) 200-25 MG 12hr capsule Take 1 capsule by mouth twice daily Patient taking differently: Take 1 capsule by mouth 2 (two) times daily.  09/24/18  Yes Tower, Wynelle Fanny, MD  famotidine (PEPCID) 40 MG tablet Take 1 tablet (40 mg total) by mouth daily. Patient taking differently: Take 40 mg by mouth See admin instructions. Take 40 mg by mouth one to two times a day 04/28/18  Yes Thornton Park, MD  glipiZIDE (GLIPIZIDE XL) 10 MG 24 hr tablet TAKE 1 TABLET BY MOUTH ONCE DAILY WITH BREAKFAST Patient taking differently: Take 10 mg by mouth daily with breakfast.  02/16/18  Yes Tower, Wynelle Fanny, MD  metFORMIN (GLUCOPHAGE) 1000 MG tablet Take 1 tablet (1,000 mg total) by mouth 2 (two) times daily with a meal. 02/01/19  Yes Tower, Marne A, MD  metoprolol succinate (TOPROL-XL) 100 MG 24 hr tablet TAKE 1 TABLET BY MOUTH ONCE DAILY TAKE  WITH  OR  IMMEDIATELY  FOLLOWING  A  MEAL Patient taking differently: Take 100 mg by mouth daily.  02/16/18  Yes Tower, Wynelle Fanny, MD  PARoxetine (PAXIL) 40 MG tablet Take 1 tablet by mouth daily Patient taking differently: Take 40 mg by mouth at bedtime.  10/02/18  Yes Tower, Wynelle Fanny, MD  amLODipine (NORVASC) 5 MG tablet Take 1 tablet (5 mg total) by mouth daily. Patient not taking: Reported on 02/15/2019 02/16/18   Tower, Wynelle Fanny, MD    Family History Family History  Problem Relation Age of Onset   Cancer Mother        vaginal CA in situ   Hypertension Mother    Heart disease  Mother        CAD and MI   Heart disease Father        CAD   Diabetes Father    Heart disease Son        congenital valve problem    Social History Social History   Tobacco Use   Smoking status: Never Smoker   Smokeless tobacco: Never Used  Substance Use Topics   Alcohol use: No    Alcohol/week: 0.0 standard drinks   Drug use: No     Allergies   Ace inhibitors, Amlodipine besy-benazepril hcl, Clopidogrel bisulfate, Erythromycin, and Tape   Review of Systems Review of Systems  Respiratory: Positive for chest tightness.   Cardiovascular: Positive for chest pain and palpitations.  All other systems reviewed and are negative.  Physical Exam Updated Vital Signs BP (!) 171/83 (BP Location: Right Arm)    Pulse 77    Temp 98.1 F (36.7 C) (Oral)    Resp (!) 22    Ht 5\' 1"  (1.549 m)    Wt 49.5 kg    SpO2 93%    BMI 20.62 kg/m   Physical Exam Vitals signs and nursing note reviewed.  Constitutional:      General: She is not in acute distress.    Appearance: She is well-developed.  HENT:     Head: Normocephalic and atraumatic.  Eyes:     Conjunctiva/sclera: Conjunctivae normal.  Neck:     Musculoskeletal: Neck supple.  Cardiovascular:     Rate and Rhythm: Normal rate and regular rhythm.     Heart sounds: No murmur.  Pulmonary:     Effort: Pulmonary effort is normal. No respiratory distress.     Breath sounds: Normal breath sounds.  Abdominal:     Palpations: Abdomen is soft.     Tenderness: There is no abdominal tenderness.  Skin:    General: Skin is warm and dry.  Neurological:     Mental Status: She is alert and oriented to person, place, and time.  Psychiatric:        Mood and Affect: Mood normal.      ED Treatments / Results  Labs (all labs ordered are listed, but only abnormal results are displayed) Labs Reviewed  BASIC METABOLIC PANEL - Abnormal; Notable for the following components:      Result Value   Creatinine, Ser 1.04 (*)    GFR  calc non Af Amer 50 (*)    GFR calc Af Amer 58 (*)    All other components within normal limits  CBC - Abnormal; Notable for the following components:   RBC 3.50 (*)    Hemoglobin 11.0 (*)    HCT 32.9 (*)    All other components within normal limits  MAGNESIUM - Abnormal; Notable for the following components:   Magnesium 1.6 (*)    All other components within normal limits  TROPONIN I (HIGH SENSITIVITY) - Abnormal; Notable for the following components:   Troponin I (High Sensitivity) 975 (*)    All other components within normal limits  TROPONIN I (HIGH SENSITIVITY) - Abnormal; Notable for the following components:   Troponin I (High Sensitivity) 3,935 (*)    All other components within normal limits  SARS CORONAVIRUS 2 (HOSPITAL ORDER, [place] [facility])  MRSA PCR SCREENING  GLUCOSE, CAPILLARY  HEPARIN LEVEL (UNFRACTIONATED)  CBC  TSH  T4, FREE  BASIC METABOLIC PANEL  CBC  LIPID PANEL    EKG EKG Interpretation  Date/Time:  Monday February 15 2019 15:59:54 EDT Ventricular Rate:  89 PR Interval:    QRS Duration: 77 QT Interval:  405 QTC Calculation: 493 R Axis:   38 Text Interpretation:  Sinus rhythm Anterior infarct, old Borderline ST depression, anterolateral leads Confirmed by Lacretia Leigh ([id number]) on 02/15/2019 4:34:09 PM   Radiology Dg Chest 2 View  Result Date: 02/15/2019 CLINICAL DATA:  Chest pain with SVT EXAM: CHEST - 2 VIEW COMPARISON:  06/04/2018, 11/11/2017, 09/25/2009 FINDINGS: Left lower calcifications. No acute airspace disease or effusion mild asymmetric opacity in the left apex. Stable cardiomediastinal silhouette with aortic atherosclerosis. No pneumothorax. Stable ovoid calcification in the left upper quadrant. IMPRESSION: No active cardiopulmonary disease. Summation shadow versus nodule in the left lung apex. Further evaluation with chest CT could be obtained.  Electronically Signed   By: Donavan Foil M.D.   On: 02/15/2019 16:57     Procedures Procedures (including critical care time)  Medications Ordered in ED Medications  sodium chloride flush (NS) 0.9 % injection 3 mL (3 mLs Intravenous Not Given 02/15/19 1805)  heparin ADULT infusion 100 units/mL (25000 units/258mL sodium chloride 0.45%) (550 Units/hr Intravenous New Bag/Given 02/15/19 1804)  metoprolol succinate (TOPROL-XL) 24 hr tablet 100 mg (100 mg Oral Given 02/15/19 1900)  atorvastatin (LIPITOR) tablet 20 mg (has no administration in time range)  PARoxetine (PAXIL) tablet 40 mg (has no administration in time range)  famotidine (PEPCID) tablet 40 mg (has no administration in time range)  dipyridamole-aspirin (AGGRENOX) 200-25 MG per 12 hr capsule 1 capsule (has no administration in time range)  aspirin EC tablet 81 mg (has no administration in time range)  nitroGLYCERIN (NITROSTAT) SL tablet 0.4 mg (has no administration in time range)  acetaminophen (TYLENOL) tablet 650 mg (has no administration in time range)  ondansetron (ZOFRAN) injection 4 mg (has no administration in time range)  sodium chloride flush (NS) 0.9 % injection 3 mL (has no administration in time range)  sodium chloride flush (NS) 0.9 % injection 3 mL (has no administration in time range)  0.9 %  sodium chloride infusion (has no administration in time range)  aspirin chewable tablet 81 mg (has no administration in time range)  sodium chloride flush (NS) 0.9 % injection 3 mL (has no administration in time range)  sodium chloride flush (NS) 0.9 % injection 3 mL (has no administration in time range)  0.9 %  sodium chloride infusion (has no administration in time range)  insulin aspart (novoLOG) injection 0-9 Units (has no administration in time range)  0.9% sodium chloride infusion (has no administration in time range)    Followed by  0.9% sodium chloride infusion (has no administration in time range)  heparin bolus via infusion 2,800 Units (2,800 Units Intravenous Bolus from Bag 02/15/19 1800)      Initial Impression / Assessment and Plan / ED Course  I have reviewed the triage vital signs and the nursing notes.  Pertinent labs & imaging results that were available during my care of the patient were reviewed by me and considered in my medical decision making (see chart for details).        Ms. Florance is an 85 year old female with a history of previous CAD status post 2 stents 15 years ago.  I reviewed her medical records.  She presents today with SVT chemically cardioverted to normal sinus rhythm.  She had positive troponin but her ECG here does not indicate acute ischemia.  She was given heparin bolus and I discussed with cardiology who assessed at bedside and subsequently admitted for continued observation and management of cardiac injury.  No electrolyte abnormality, significant new anemia or leukocytosis.  Chest x-ray negative for acute cardiopulmonary abnormality.  Nodule noted in the left lung apex.  She was hemodynamically stable while in the emergency department under my care.  Final Clinical Impressions(s) / ED Diagnoses   Final diagnoses:  SVT (supraventricular tachycardia) (HCC)  NSTEMI (non-ST elevated myocardial infarction) [facility])  Coronary artery disease involving native heart without angina pectoris, unspecified vessel or lesion type    ED Discharge Orders    None       Tillie Fantasia, MD 02/15/19 Kennedy Bucker    Lacretia Leigh, MD 02/19/19 [phone]

## 2019-02-15 NOTE — ED Notes (Signed)
Cardiology at bedside.

## 2019-02-15 NOTE — ED Triage Notes (Signed)
Pt with EMS with c/o chest pain and tachycardia when she was changing her bird feeder. Initial Rate 190-200 currently 90-100 after 6 of adenosine. Denies chest pain or SOB, positive for nausea declines zofran.  Hx of cardiac stents

## 2019-02-15 NOTE — Progress Notes (Signed)
ANTICOAGULATION CONSULT NOTE - Initial Consult  Pharmacy Consult for Heparin IV Indication: chest pain/ACS  Allergies  Allergen Reactions  . Ace Inhibitors Other (See Comments)    REACTION: generic, reaction not known  . Amlodipine Besy-Benazepril Hcl Other (See Comments)    REACTION: chest pain  . Clopidogrel Bisulfate Other (See Comments)    REACTION: GI  . Erythromycin Other (See Comments)    REACTION: GI upset    Patient Measurements: Height: 5\' 1"  (154.9 cm) Weight: 105 lb (47.6 kg) IBW/kg (Calculated) : 47.8 Heparin Dosing Weight: 47.6 kg  Vital Signs: Temp: 98.7 F (37.1 C) (08/03 1600) Temp Source: Oral (08/03 1600) BP: 156/100 (08/03 1630) Pulse Rate: 87 (08/03 1630)  Labs: Recent Labs    02/15/19 1601  HGB 11.0*  HCT 32.9*  PLT 224  CREATININE 1.04*  TROPONINIHS 975*    CrCl cannot be calculated (Unknown ideal weight.).   Medical History: Past Medical History:  Diagnosis Date  . Allergy    allergic rhinitis  . Anxiety   . Arthritis    osteoarthritis  . Degenerative disk disease    in neck  . Diabetes mellitus    type II  . Fracture of femoral neck, left ([place]) 01/21/2018  . Hyperlipidemia   . Hypertension   . Hypoaldosteronism ([place]) 6/12   from DM causing hyperkalemia   . Osteopenia   . Periodontal disease   . Peripheral vascular disease ([place])   . Transient cerebral ischemia 11/06/2016    Assessment: 85 year old female who presents to [facility] ED with chest pain. Note history of DM, HTN, HLD, and peripheral vascular disease. Patient is not on anticoagulants prior to admission.   Hgb is 11. Platelets are within normal limits at 224.  Trop (High sensitivity) is elevated at 975.  SCr is 1.04   Goal of Therapy:  Heparin level 0.3-0.7 units/ml Monitor platelets by anticoagulation protocol: Yes   Plan:  Heparin bolus of 2800 units IV x1 (~60 units/kg). Heparin drip at 550 units/hr.  Heparin level in 8 hours.  Daily Heparin level and  CBC.   Sloan Leiter, PharmD, BCPS, BCCCP Clinical Pharmacist Please refer to [facility] for [place] numbers 02/15/2019,5:21 PM

## 2019-02-16 ENCOUNTER — Other Ambulatory Visit: Payer: Self-pay

## 2019-02-16 ENCOUNTER — Encounter (HOSPITAL_COMMUNITY): Payer: Self-pay | Admitting: Cardiovascular Disease

## 2019-02-16 ENCOUNTER — Encounter (HOSPITAL_COMMUNITY)
Admission: EM | Disposition: A | Discharge status: Home / Self Care | Payer: Self-pay | Source: Home / Self Care | Attending: Cardiovascular Disease

## 2019-02-16 ENCOUNTER — Telehealth: Payer: Self-pay | Admitting: Family Medicine

## 2019-02-16 ENCOUNTER — Observation Stay (HOSPITAL_BASED_OUTPATIENT_CLINIC_OR_DEPARTMENT_OTHER): Payer: Medicare Other

## 2019-02-16 DIAGNOSIS — I214 Non-ST elevation (NSTEMI) myocardial infarction: Secondary | ICD-10-CM | POA: Insufficient documentation

## 2019-02-16 DIAGNOSIS — I25119 Atherosclerotic heart disease of native coronary artery with unspecified angina pectoris: Secondary | ICD-10-CM | POA: Diagnosis not present

## 2019-02-16 DIAGNOSIS — E785 Hyperlipidemia, unspecified: Secondary | ICD-10-CM

## 2019-02-16 DIAGNOSIS — I5022 Chronic systolic (congestive) heart failure: Secondary | ICD-10-CM | POA: Diagnosis not present

## 2019-02-16 DIAGNOSIS — R531 Weakness: Secondary | ICD-10-CM | POA: Diagnosis not present

## 2019-02-16 DIAGNOSIS — Z833 Family history of diabetes mellitus: Secondary | ICD-10-CM | POA: Diagnosis not present

## 2019-02-16 DIAGNOSIS — Z8673 Personal history of transient ischemic attack (TIA), and cerebral infarction without residual deficits: Secondary | ICD-10-CM | POA: Diagnosis not present

## 2019-02-16 DIAGNOSIS — M1990 Unspecified osteoarthritis, unspecified site: Secondary | ICD-10-CM | POA: Diagnosis not present

## 2019-02-16 DIAGNOSIS — E861 Hypovolemia: Secondary | ICD-10-CM | POA: Diagnosis not present

## 2019-02-16 DIAGNOSIS — E11319 Type 2 diabetes mellitus with unspecified diabetic retinopathy without macular edema: Secondary | ICD-10-CM

## 2019-02-16 DIAGNOSIS — E871 Hypo-osmolality and hyponatremia: Secondary | ICD-10-CM | POA: Diagnosis not present

## 2019-02-16 DIAGNOSIS — R29810 Facial weakness: Secondary | ICD-10-CM | POA: Diagnosis not present

## 2019-02-16 DIAGNOSIS — Z79899 Other long term (current) drug therapy: Secondary | ICD-10-CM | POA: Diagnosis not present

## 2019-02-16 DIAGNOSIS — N182 Chronic kidney disease, stage 2 (mild): Secondary | ICD-10-CM | POA: Diagnosis not present

## 2019-02-16 DIAGNOSIS — G319 Degenerative disease of nervous system, unspecified: Secondary | ICD-10-CM | POA: Diagnosis not present

## 2019-02-16 DIAGNOSIS — Z03818 Encounter for observation for suspected exposure to other biological agents ruled out: Secondary | ICD-10-CM | POA: Diagnosis not present

## 2019-02-16 DIAGNOSIS — Z7982 Long term (current) use of aspirin: Secondary | ICD-10-CM | POA: Diagnosis not present

## 2019-02-16 DIAGNOSIS — I6782 Cerebral ischemia: Secondary | ICD-10-CM | POA: Diagnosis not present

## 2019-02-16 DIAGNOSIS — I471 Supraventricular tachycardia: Secondary | ICD-10-CM | POA: Diagnosis not present

## 2019-02-16 DIAGNOSIS — M8580 Other specified disorders of bone density and structure, unspecified site: Secondary | ICD-10-CM | POA: Diagnosis not present

## 2019-02-16 DIAGNOSIS — I2510 Atherosclerotic heart disease of native coronary artery without angina pectoris: Secondary | ICD-10-CM

## 2019-02-16 DIAGNOSIS — E782 Mixed hyperlipidemia: Secondary | ICD-10-CM

## 2019-02-16 DIAGNOSIS — Z8249 Family history of ischemic heart disease and other diseases of the circulatory system: Secondary | ICD-10-CM | POA: Diagnosis not present

## 2019-02-16 DIAGNOSIS — Z20828 Contact with and (suspected) exposure to other viral communicable diseases: Secondary | ICD-10-CM | POA: Diagnosis present

## 2019-02-16 DIAGNOSIS — E1151 Type 2 diabetes mellitus with diabetic peripheral angiopathy without gangrene: Secondary | ICD-10-CM | POA: Diagnosis not present

## 2019-02-16 DIAGNOSIS — I248 Other forms of acute ischemic heart disease: Secondary | ICD-10-CM | POA: Diagnosis not present

## 2019-02-16 DIAGNOSIS — I6340 Cerebral infarction due to embolism of unspecified cerebral artery: Secondary | ICD-10-CM | POA: Diagnosis not present

## 2019-02-16 DIAGNOSIS — E1122 Type 2 diabetes mellitus with diabetic chronic kidney disease: Secondary | ICD-10-CM | POA: Diagnosis not present

## 2019-02-16 DIAGNOSIS — I129 Hypertensive chronic kidney disease with stage 1 through stage 4 chronic kidney disease, or unspecified chronic kidney disease: Secondary | ICD-10-CM | POA: Diagnosis not present

## 2019-02-16 DIAGNOSIS — M47812 Spondylosis without myelopathy or radiculopathy, cervical region: Secondary | ICD-10-CM | POA: Diagnosis not present

## 2019-02-16 DIAGNOSIS — R29700 NIHSS score 0: Secondary | ICD-10-CM | POA: Diagnosis not present

## 2019-02-16 DIAGNOSIS — D649 Anemia, unspecified: Secondary | ICD-10-CM | POA: Diagnosis not present

## 2019-02-16 DIAGNOSIS — I639 Cerebral infarction, unspecified: Secondary | ICD-10-CM | POA: Diagnosis not present

## 2019-02-16 DIAGNOSIS — R079 Chest pain, unspecified: Secondary | ICD-10-CM

## 2019-02-16 DIAGNOSIS — Z96642 Presence of left artificial hip joint: Secondary | ICD-10-CM | POA: Diagnosis not present

## 2019-02-16 DIAGNOSIS — E1169 Type 2 diabetes mellitus with other specified complication: Secondary | ICD-10-CM

## 2019-02-16 DIAGNOSIS — I6523 Occlusion and stenosis of bilateral carotid arteries: Secondary | ICD-10-CM | POA: Diagnosis not present

## 2019-02-16 DIAGNOSIS — R0902 Hypoxemia: Secondary | ICD-10-CM | POA: Diagnosis not present

## 2019-02-16 DIAGNOSIS — I10 Essential (primary) hypertension: Secondary | ICD-10-CM

## 2019-02-16 DIAGNOSIS — Z7984 Long term (current) use of oral hypoglycemic drugs: Secondary | ICD-10-CM | POA: Diagnosis not present

## 2019-02-16 DIAGNOSIS — E041 Nontoxic single thyroid nodule: Secondary | ICD-10-CM | POA: Diagnosis not present

## 2019-02-16 DIAGNOSIS — I130 Hypertensive heart and chronic kidney disease with heart failure and stage 1 through stage 4 chronic kidney disease, or unspecified chronic kidney disease: Secondary | ICD-10-CM | POA: Diagnosis not present

## 2019-02-16 DIAGNOSIS — Z955 Presence of coronary angioplasty implant and graft: Secondary | ICD-10-CM | POA: Diagnosis not present

## 2019-02-16 DIAGNOSIS — E559 Vitamin D deficiency, unspecified: Secondary | ICD-10-CM

## 2019-02-16 HISTORY — PX: LEFT HEART CATH AND CORONARY ANGIOGRAPHY: CATH118249

## 2019-02-16 LAB — BASIC METABOLIC PANEL
Anion gap: 12 (ref 5–15)
BUN: 20 mg/dL (ref 8–23)
CO2: 22 mmol/L (ref 22–32)
Calcium: 9.4 mg/dL (ref 8.9–10.3)
Chloride: 100 mmol/L (ref 98–111)
Creatinine, Ser: 0.99 mg/dL (ref 0.44–1.00)
GFR calc Af Amer: >60 mL/min (ref >60)
GFR calc non Af Amer: 53 mL/min — ABNORMAL LOW (ref >60)
Glucose, Bld: 106 mg/dL — ABNORMAL HIGH (ref 70–99)
Potassium: 4.1 mmol/L (ref 3.5–5.1)
Sodium: 134 mmol/L — ABNORMAL LOW (ref 135–145)

## 2019-02-16 LAB — LIPID PANEL
Cholesterol: 116 mg/dL (ref 0–200)
HDL: 39 mg/dL — ABNORMAL LOW (ref >40)
LDL Cholesterol: 25 mg/dL (ref 0–99)
Total CHOL/HDL Ratio: 3 RATIO
Triglycerides: 259 mg/dL — ABNORMAL HIGH (ref <150)
VLDL: 52 mg/dL — ABNORMAL HIGH (ref 0–40)

## 2019-02-16 LAB — CBC
HCT: 31.9 % — ABNORMAL LOW (ref 36.0–46.0)
Hemoglobin: 11.1 g/dL — ABNORMAL LOW (ref 12.0–15.0)
MCH: 31.8 pg (ref 26.0–34.0)
MCHC: 34.8 g/dL (ref 30.0–36.0)
MCV: 91.4 fL (ref 80.0–100.0)
Platelets: 226 10*3/uL (ref 150–400)
RBC: 3.49 MIL/uL — ABNORMAL LOW (ref 3.87–5.11)
RDW: 12 % (ref 11.5–15.5)
WBC: 10.3 10*3/uL (ref 4.0–10.5)
nRBC: 0 % (ref 0.0–0.2)

## 2019-02-16 LAB — ECHOCARDIOGRAM COMPLETE
Height: 61 in
Weight: 1731.93 oz

## 2019-02-16 LAB — TSH: TSH: 1.406 u[IU]/mL (ref 0.350–4.500)

## 2019-02-16 LAB — GLUCOSE, CAPILLARY
Glucose-Capillary: 117 mg/dL — ABNORMAL HIGH (ref 70–99)
Glucose-Capillary: 112 mg/dL — ABNORMAL HIGH (ref 70–99)

## 2019-02-16 LAB — HEPARIN LEVEL (UNFRACTIONATED)
Heparin Unfractionated: 0.22 IU/mL — ABNORMAL LOW (ref 0.30–0.70)
Heparin Unfractionated: 0.24 IU/mL — ABNORMAL LOW (ref 0.30–0.70)

## 2019-02-16 LAB — T4, FREE: Free T4: 0.91 ng/dL (ref 0.61–1.12)

## 2019-02-16 SURGERY — LEFT HEART CATH AND CORONARY ANGIOGRAPHY
Anesthesia: LOCAL

## 2019-02-16 MED ORDER — FENTANYL CITRATE (PF) 100 MCG/2ML IJ SOLN
INTRAMUSCULAR | Status: DC | PRN
  Administered 2019-02-16: 25 ug via INTRAVENOUS

## 2019-02-16 MED ORDER — VERAPAMIL HCL 2.5 MG/ML IV SOLN
INTRAVENOUS | Status: AC
  Filled 2019-02-16: qty 2

## 2019-02-16 MED ORDER — IOHEXOL 350 MG/ML SOLN
INTRAVENOUS | Status: DC | PRN
  Administered 2019-02-16: 25 mL via INTRACARDIAC

## 2019-02-16 MED ORDER — SODIUM CHLORIDE 0.9% FLUSH: 3.0000 mL | INTRAVENOUS | Status: DC | PRN

## 2019-02-16 MED ORDER — SODIUM CHLORIDE 0.9 % IV SOLN: INTRAVENOUS | Status: AC

## 2019-02-16 MED ORDER — HEPARIN SODIUM (PORCINE) 1000 UNIT/ML IJ SOLN
INTRAMUSCULAR | Status: DC | PRN
  Administered 2019-02-16: 2500 [IU] via INTRAVENOUS

## 2019-02-16 MED ORDER — SODIUM CHLORIDE 0.9 % IV SOLN: 250.0000 mL | INTRAVENOUS | Status: DC | PRN

## 2019-02-16 MED ORDER — FENTANYL CITRATE (PF) 100 MCG/2ML IJ SOLN
INTRAMUSCULAR | Status: AC
  Filled 2019-02-16: qty 2

## 2019-02-16 MED ORDER — HEPARIN (PORCINE) IN NACL 1000-0.9 UT/500ML-% IV SOLN
INTRAVENOUS | Status: DC | PRN
  Administered 2019-02-16 (×2): 500 mL

## 2019-02-16 MED ORDER — VERAPAMIL HCL 2.5 MG/ML IV SOLN
INTRAVENOUS | Status: DC | PRN
  Administered 2019-02-16: 10 mL via INTRA_ARTERIAL

## 2019-02-16 MED ORDER — LABETALOL HCL 5 MG/ML IV SOLN: 10.0000 mg | INTRAVENOUS | Status: AC | PRN

## 2019-02-16 MED ORDER — LIDOCAINE HCL (PF) 1 % IJ SOLN
INTRAMUSCULAR | Status: AC
  Filled 2019-02-16: qty 30

## 2019-02-16 MED ORDER — MIDAZOLAM HCL 2 MG/2ML IJ SOLN
INTRAMUSCULAR | Status: DC | PRN
  Administered 2019-02-16: 0.5 mg via INTRAVENOUS

## 2019-02-16 MED ORDER — HEPARIN SODIUM (PORCINE) 1000 UNIT/ML IJ SOLN
INTRAMUSCULAR | Status: AC
  Filled 2019-02-16: qty 1

## 2019-02-16 MED ORDER — SODIUM CHLORIDE 0.9% FLUSH
3.0000 mL | Freq: Two times a day (BID) | INTRAVENOUS | Status: DC
  Administered 2019-02-16: 22:00:00 3 mL via INTRAVENOUS

## 2019-02-16 MED ORDER — MIDAZOLAM HCL 2 MG/2ML IJ SOLN
INTRAMUSCULAR | Status: AC
  Filled 2019-02-16: qty 2

## 2019-02-16 MED ORDER — LIDOCAINE HCL (PF) 1 % IJ SOLN
INTRAMUSCULAR | Status: DC | PRN
  Administered 2019-02-16: 2 mL via INTRADERMAL

## 2019-02-16 MED ORDER — HEPARIN (PORCINE) IN NACL 1000-0.9 UT/500ML-% IV SOLN
INTRAVENOUS | Status: AC
  Filled 2019-02-16: qty 1000

## 2019-02-16 SURGICAL SUPPLY — 11 items
CATH INFINITI 5FR JK (CATHETERS) ×1 IMPLANT
COVER DOME SNAP 22 D (MISCELLANEOUS) ×1 IMPLANT
DEVICE RAD COMP TR BAND LRG (VASCULAR PRODUCTS) ×1 IMPLANT
GLIDESHEATH SLEND SS 6F .021 (SHEATH) ×1 IMPLANT
GUIDEWIRE INQWIRE 1.5J.035X260 (WIRE) IMPLANT
INQWIRE 1.5J .035X260CM (WIRE) ×2
KIT HEART LEFT (KITS) ×2 IMPLANT
PACK CARDIAC CATHETERIZATION (CUSTOM PROCEDURE TRAY) ×2 IMPLANT
TRANSDUCER W/STOPCOCK (MISCELLANEOUS) ×2 IMPLANT
TUBING CIL FLEX 10 FLL-RA (TUBING) ×2 IMPLANT
WIRE HI TORQ VERSACORE-J 145CM (WIRE) ×1 IMPLANT

## 2019-02-16 NOTE — Progress Notes (Signed)
[place] for Heparin Indication: chest pain/ACS  Allergies  Allergen Reactions  . Ace Inhibitors Other (See Comments)    Chest pain  . Amlodipine Besy-Benazepril Hcl Other (See Comments)    Chest pain  . Clopidogrel Bisulfate Other (See Comments)    GI upset  . Erythromycin Other (See Comments)    GI upset  . Tape Other (See Comments)    Causes soreness (of the skin)    Patient Measurements: Height: 5\' 1"  (154.9 cm) Weight: 108 lb 3.9 oz (49.1 kg) IBW/kg (Calculated) : 47.8 Heparin Dosing Weight: 47.6 kg  Vital Signs: Temp: 98.6 F (37 C) (08/04 0726) Temp Source: Oral (08/04 0726) BP: 163/69 (08/04 0726) Pulse Rate: 62 (08/04 0726)  Labs: Recent Labs    02/15/19 1601 02/15/19 1801 02/16/19 0219 02/16/19 1020  HGB 11.0*  --  11.1*  --   HCT 32.9*  --  31.9*  --   PLT 224  --  226  --   HEPARINUNFRC  --   --  0.22* 0.24*  CREATININE 1.04*  --  0.99  --   TROPONINIHS 975* 3,935*  --   --     Estimated Creatinine Clearance: 32.5 mL/min (by C-G formula based on SCr of 0.99 mg/dL).   Medical History: Past Medical History:  Diagnosis Date  . Allergy    allergic rhinitis  . Anxiety   . Arthritis    osteoarthritis  . Degenerative disk disease    in neck  . Diabetes mellitus    type II  . Fracture of femoral neck, left ([place]) 01/21/2018  . Hyperlipidemia   . Hypertension   . Hypoaldosteronism ([place]) 6/12   from DM causing hyperkalemia   . Osteopenia   . Periodontal disease   . Peripheral vascular disease ([place])   . Transient cerebral ischemia 11/06/2016    Assessment: 85 year old female who presents to [facility] ED with chest pain. Note history of DM, HTN, HLD, and peripheral vascular disease. Patient is not on anticoagulants prior to admission.   Hgb stable. Platelets are within normal limits Bump in trop plan cath today  Heparin drip 650 uts/hr HL 0.24 < goal - but plan for cath lab soon so no change in drip  rate for now  Goal of Therapy:  Heparin level 0.3-0.7 units/ml Monitor platelets by anticoagulation protocol: Yes   Plan:  Continue  heparin to 650 units/hr - follow up after cath   [employer].D. CPP, BCPS Clinical Pharmacist [phone] 02/16/2019 12:02 PM

## 2019-02-16 NOTE — Progress Notes (Signed)
[place] for Heparin Indication: chest pain/ACS  Allergies  Allergen Reactions  . Ace Inhibitors Other (See Comments)    Chest pain  . Amlodipine Besy-Benazepril Hcl Other (See Comments)    Chest pain  . Clopidogrel Bisulfate Other (See Comments)    GI upset  . Erythromycin Other (See Comments)    GI upset  . Tape Other (See Comments)    Causes soreness (of the skin)    Patient Measurements: Height: 5\' 1"  (154.9 cm) Weight: 109 lb 2 oz (49.5 kg) IBW/kg (Calculated) : 47.8 Heparin Dosing Weight: 47.6 kg  Vital Signs: Temp: 98.1 F (36.7 C) (08/03 2300) Temp Source: Oral (08/03 2300) BP: 171/83 (08/03 2300) Pulse Rate: 77 (08/03 2300)  Labs: Recent Labs    02/15/19 1601 02/15/19 1801 02/16/19 0219  HGB 11.0*  --  11.1*  HCT 32.9*  --  31.9*  PLT 224  --  226  HEPARINUNFRC  --   --  0.22*  CREATININE 1.04*  --   --   TROPONINIHS 975* 3,935*  --     Estimated Creatinine Clearance: 30.9 mL/min (A) (by C-G formula based on SCr of 1.04 mg/dL (H)).   Medical History: Past Medical History:  Diagnosis Date  . Allergy    allergic rhinitis  . Anxiety   . Arthritis    osteoarthritis  . Degenerative disk disease    in neck  . Diabetes mellitus    type II  . Fracture of femoral neck, left ([place]) 01/21/2018  . Hyperlipidemia   . Hypertension   . Hypoaldosteronism ([place]) 6/12   from DM causing hyperkalemia   . Osteopenia   . Periodontal disease   . Peripheral vascular disease ([place])   . Transient cerebral ischemia 11/06/2016    Assessment: 85 year old female who presents to [facility] ED with chest pain. Note history of DM, HTN, HLD, and peripheral vascular disease. Patient is not on anticoagulants prior to admission.   Hgb is 11. Platelets are within normal limits at 224.  Trop (High sensitivity) is elevated at 975.  SCr is 1.04   8/4 AM update: initial heparin level sub-therapeutic   Goal of Therapy:  Heparin level  0.3-0.7 units/ml Monitor platelets by anticoagulation protocol: Yes   Plan:  Inc heparin to 650 units/hr Heparin level in 8 hours.  Daily Heparin level and CBC.   Narda Bonds, PharmD, BCPS Clinical Pharmacist Phone: [phone]

## 2019-02-16 NOTE — Progress Notes (Signed)
Heparin drip stopped, patient via bed to cath lab

## 2019-02-16 NOTE — Progress Notes (Signed)
Subjective:  No complaints Happy Dr Fletcher Anon is doing her cath He takes care of her husband   Objective:  Vitals:   02/15/19 2035 02/15/19 2300 02/16/19 0405 02/16/19 0726  BP: (!) 158/143 (!) 171/83  (!) 163/69  Pulse: 81 77 72 62  Resp: 15 (!) 22 19 15   Temp: 98.6 F (37 C) 98.1 F (36.7 C)  98.6 F (37 C)  TempSrc: Oral Oral  Oral  SpO2: 98% 93% 98% 99%  Weight: 49.5 kg  49.1 kg   Height: 5\' 1"  (1.549 m)       Intake/Output from previous day:  Intake/Output Summary (Last 24 hours) at 02/16/2019 0820 Last data filed at 02/16/2019 0236 Gross per 24 hour  Intake 281.13 ml  Output 250 ml  Net 31.13 ml    Physical Exam:  Affect appropriate Pale elderly female  HEENT: normal Neck supple with no adenopathy JVP normal no bruits no thyromegaly Lungs clear with no wheezing and good diaphragmatic motion Heart:  S1/S2 no murmur, no rub, gallop or click PMI normal Abdomen: benighn, BS positve, no tenderness, no AAA no bruit.  No HSM or HJR Distal pulses intact with no bruits No edema Neuro non-focal Skin warm and dry No muscular weakness   Lab Results: Basic Metabolic Panel: Recent Labs    02/15/19 1601 02/15/19 1801 02/16/19 0219  NA 137  --  134*  K 4.2  --  4.1  CL 101  --  100  CO2 25  --  22  GLUCOSE 82  --  106*  BUN 21  --  20  CREATININE 1.04*  --  0.99  CALCIUM 9.7  --  9.4  MG  --  1.6*  --    Liver Function Tests: No results for input(s): AST, ALT, ALKPHOS, BILITOT, PROT, ALBUMIN in the last 72 hours. No results for input(s): LIPASE, AMYLASE in the last 72 hours. CBC: Recent Labs    02/15/19 1601 02/16/19 0219  WBC 7.9 10.3  HGB 11.0* 11.1*  HCT 32.9* 31.9*  MCV 94.0 91.4  PLT 224 226   Fasting Lipid Panel: Recent Labs    02/16/19 0219  CHOL 116  HDL 39*  LDLCALC 25  TRIG 259*  CHOLHDL 3.0   Thyroid Function Tests: Recent Labs    02/16/19 0219  TSH 1.406   Anemia Panel: No results for input(s): VITAMINB12, FOLATE,  FERRITIN, TIBC, IRON, RETICCTPCT in the last 72 hours.  Imaging: Dg Chest 2 View  Result Date: 02/15/2019 CLINICAL DATA:  Chest pain with SVT EXAM: CHEST - 2 VIEW COMPARISON:  06/04/2018, 11/11/2017, 09/25/2009 FINDINGS: Left lower calcifications. No acute airspace disease or effusion mild asymmetric opacity in the left apex. Stable cardiomediastinal silhouette with aortic atherosclerosis. No pneumothorax. Stable ovoid calcification in the left upper quadrant. IMPRESSION: No active cardiopulmonary disease. Summation shadow versus nodule in the left lung apex. Further evaluation with chest CT could be obtained. Electronically Signed   By: Donavan Foil M.D.   On: 02/15/2019 16:57    Cardiac Studies:  ECG: SR nonspecific ST changes    Telemetry:  NSR rates in 60's   Echo:   Medications:   . aspirin EC  81 mg Oral Daily  . atorvastatin  20 mg Oral QHS  . dipyridamole-aspirin  1 capsule Oral BID  . famotidine  40 mg Oral Daily  . insulin aspart  0-9 Units Subcutaneous TID WC  . metoprolol succinate  100 mg Oral Daily  . PARoxetine  40 mg Oral QHS  . sodium chloride flush  3 mL Intravenous Once  . sodium chloride flush  3 mL Intravenous Q12H  . sodium chloride flush  3 mL Intravenous Q12H     . sodium chloride    . sodium chloride    . sodium chloride 1 mL/kg/hr (02/16/19 0522)  . heparin 650 Units/hr (02/16/19 0258)    Assessment/Plan:   SVT:  Resolved with adenosine continue Toprol Patient had run out of this medicine since last week prior to admission   CAD:  Angina during SVT with elevated troponin 3935  For cath today with Dr Fletcher Anon History of LAD/D1 stents in 2005. High likelyhood of restenosis or de novo lesions Continue heparin   HLD:  On statin   Jenkins Rouge 02/16/2019, 8:20 AM

## 2019-02-16 NOTE — Progress Notes (Signed)
Patient returns from cath lab, TR band inplace, inflated to 12 ccs of air.  Patient without complaints

## 2019-02-16 NOTE — Progress Notes (Signed)
Called to room by patient spouse, patient removed TR band and hemorrhaging onto the bed.  Pressure applied and cath lab called to assist

## 2019-02-16 NOTE — H&P (View-Only) (Signed)
Subjective:  No complaints Happy Dr Fletcher Anon is doing her cath He takes care of her husband   Objective:  Vitals:   02/15/19 2035 02/15/19 2300 02/16/19 0405 02/16/19 0726  BP: (!) 158/143 (!) 171/83  (!) 163/69  Pulse: 81 77 72 62  Resp: 15 (!) 22 19 15   Temp: 98.6 F (37 C) 98.1 F (36.7 C)  98.6 F (37 C)  TempSrc: Oral Oral  Oral  SpO2: 98% 93% 98% 99%  Weight: 49.5 kg  49.1 kg   Height: 5\' 1"  (1.549 m)       Intake/Output from previous day:  Intake/Output Summary (Last 24 hours) at 02/16/2019 0820 Last data filed at 02/16/2019 0236 Gross per 24 hour  Intake 281.13 ml  Output 250 ml  Net 31.13 ml    Physical Exam:  Affect appropriate Pale elderly female  HEENT: normal Neck supple with no adenopathy JVP normal no bruits no thyromegaly Lungs clear with no wheezing and good diaphragmatic motion Heart:  S1/S2 no murmur, no rub, gallop or click PMI normal Abdomen: benighn, BS positve, no tenderness, no AAA no bruit.  No HSM or HJR Distal pulses intact with no bruits No edema Neuro non-focal Skin warm and dry No muscular weakness   Lab Results: Basic Metabolic Panel: Recent Labs    02/15/19 1601 02/15/19 1801 02/16/19 0219  NA 137  --  134*  K 4.2  --  4.1  CL 101  --  100  CO2 25  --  22  GLUCOSE 82  --  106*  BUN 21  --  20  CREATININE 1.04*  --  0.99  CALCIUM 9.7  --  9.4  MG  --  1.6*  --    Liver Function Tests: No results for input(s): AST, ALT, ALKPHOS, BILITOT, PROT, ALBUMIN in the last 72 hours. No results for input(s): LIPASE, AMYLASE in the last 72 hours. CBC: Recent Labs    02/15/19 1601 02/16/19 0219  WBC 7.9 10.3  HGB 11.0* 11.1*  HCT 32.9* 31.9*  MCV 94.0 91.4  PLT 224 226   Fasting Lipid Panel: Recent Labs    02/16/19 0219  CHOL 116  HDL 39*  LDLCALC 25  TRIG 259*  CHOLHDL 3.0   Thyroid Function Tests: Recent Labs    02/16/19 0219  TSH 1.406   Anemia Panel: No results for input(s): VITAMINB12, FOLATE,  FERRITIN, TIBC, IRON, RETICCTPCT in the last 72 hours.  Imaging: Dg Chest 2 View  Result Date: 02/15/2019 CLINICAL DATA:  Chest pain with SVT EXAM: CHEST - 2 VIEW COMPARISON:  06/04/2018, 11/11/2017, 09/25/2009 FINDINGS: Left lower calcifications. No acute airspace disease or effusion mild asymmetric opacity in the left apex. Stable cardiomediastinal silhouette with aortic atherosclerosis. No pneumothorax. Stable ovoid calcification in the left upper quadrant. IMPRESSION: No active cardiopulmonary disease. Summation shadow versus nodule in the left lung apex. Further evaluation with chest CT could be obtained. Electronically Signed   By: Donavan Foil M.D.   On: 02/15/2019 16:57    Cardiac Studies:  ECG: SR nonspecific ST changes    Telemetry:  NSR rates in 60's   Echo:   Medications:   . aspirin EC  81 mg Oral Daily  . atorvastatin  20 mg Oral QHS  . dipyridamole-aspirin  1 capsule Oral BID  . famotidine  40 mg Oral Daily  . insulin aspart  0-9 Units Subcutaneous TID WC  . metoprolol succinate  100 mg Oral Daily  . PARoxetine  40 mg Oral QHS  . sodium chloride flush  3 mL Intravenous Once  . sodium chloride flush  3 mL Intravenous Q12H  . sodium chloride flush  3 mL Intravenous Q12H     . sodium chloride    . sodium chloride    . sodium chloride 1 mL/kg/hr (02/16/19 0522)  . heparin 650 Units/hr (02/16/19 0258)    Assessment/Plan:   SVT:  Resolved with adenosine continue Toprol Patient had run out of this medicine since last week prior to admission   CAD:  Angina during SVT with elevated troponin 3935  For cath today with Dr Fletcher Anon History of LAD/D1 stents in 2005. High likelyhood of restenosis or de novo lesions Continue heparin   HLD:  On statin   Jenkins Rouge 02/16/2019, 8:20 AM

## 2019-02-16 NOTE — Progress Notes (Signed)
  Echocardiogram 2D Echocardiogram has been performed.  Stephanie [name] L Androw 02/16/2019, 9:58 AM

## 2019-02-16 NOTE — Progress Notes (Signed)
Cath lab nurse replaced TR band with a new band, plan for 1 hour before deflating

## 2019-02-16 NOTE — Telephone Encounter (Signed)
-----   Message from Cloyd Stagers, RT sent at 02/12/2019  1:05 PM EDT ----- Regarding: Lab Orders for Wednesday 8.5.2020 Please place lab orders for Wednesday 8.5.2020, office visit for physical on Monday 8.10.2020 Thank you, Dyke Maes RT(R)

## 2019-02-16 NOTE — Interval H&P Note (Signed)
Cath Lab Visit (complete for each Cath Lab visit)  Clinical Evaluation Leading to the Procedure:   ACS: Yes.    Non-ACS:  n/a   History and Physical Interval Note:  02/16/2019 11:46 AM  Stephanie [name]  has presented today for surgery, with the diagnosis of elevated trop.  The various methods of treatment have been discussed with the patient and family. After consideration of risks, benefits and other options for treatment, the patient has consented to  Procedure(s): LEFT HEART CATH AND CORONARY ANGIOGRAPHY (N/A) as a surgical intervention.  The patient's history has been reviewed, patient examined, no change in status, stable for surgery.  I have reviewed the patient's chart and labs.  Questions were answered to the patient's satisfaction.     [name]

## 2019-02-17 ENCOUNTER — Ambulatory Visit: Payer: Medicare Other

## 2019-02-17 ENCOUNTER — Emergency Department (HOSPITAL_COMMUNITY): Payer: Medicare Other

## 2019-02-17 ENCOUNTER — Inpatient Hospital Stay (HOSPITAL_COMMUNITY)
Admission: EM | Admit: 2019-02-17 | Discharge: 2019-02-20 | DRG: 065 | Disposition: A | Discharge status: Home / Self Care | Payer: Medicare Other | Attending: Family Medicine | Admitting: Family Medicine

## 2019-02-17 ENCOUNTER — Other Ambulatory Visit: Payer: Self-pay

## 2019-02-17 ENCOUNTER — Other Ambulatory Visit: Payer: Medicare Other

## 2019-02-17 DIAGNOSIS — I471 Supraventricular tachycardia: Secondary | ICD-10-CM | POA: Diagnosis present

## 2019-02-17 DIAGNOSIS — I6782 Cerebral ischemia: Secondary | ICD-10-CM | POA: Diagnosis not present

## 2019-02-17 DIAGNOSIS — Z96642 Presence of left artificial hip joint: Secondary | ICD-10-CM | POA: Diagnosis present

## 2019-02-17 DIAGNOSIS — Z79899 Other long term (current) drug therapy: Secondary | ICD-10-CM

## 2019-02-17 DIAGNOSIS — G319 Degenerative disease of nervous system, unspecified: Secondary | ICD-10-CM | POA: Diagnosis not present

## 2019-02-17 DIAGNOSIS — E041 Nontoxic single thyroid nodule: Secondary | ICD-10-CM | POA: Diagnosis not present

## 2019-02-17 DIAGNOSIS — I10 Essential (primary) hypertension: Secondary | ICD-10-CM | POA: Diagnosis present

## 2019-02-17 DIAGNOSIS — R29810 Facial weakness: Secondary | ICD-10-CM | POA: Diagnosis present

## 2019-02-17 DIAGNOSIS — E1151 Type 2 diabetes mellitus with diabetic peripheral angiopathy without gangrene: Secondary | ICD-10-CM | POA: Diagnosis present

## 2019-02-17 DIAGNOSIS — I6523 Occlusion and stenosis of bilateral carotid arteries: Secondary | ICD-10-CM | POA: Diagnosis not present

## 2019-02-17 DIAGNOSIS — I248 Other forms of acute ischemic heart disease: Secondary | ICD-10-CM | POA: Diagnosis present

## 2019-02-17 DIAGNOSIS — I6340 Cerebral infarction due to embolism of unspecified cerebral artery: Principal | ICD-10-CM | POA: Diagnosis present

## 2019-02-17 DIAGNOSIS — I2510 Atherosclerotic heart disease of native coronary artery without angina pectoris: Secondary | ICD-10-CM | POA: Diagnosis not present

## 2019-02-17 DIAGNOSIS — N182 Chronic kidney disease, stage 2 (mild): Secondary | ICD-10-CM | POA: Diagnosis not present

## 2019-02-17 DIAGNOSIS — E861 Hypovolemia: Secondary | ICD-10-CM | POA: Diagnosis not present

## 2019-02-17 DIAGNOSIS — E119 Type 2 diabetes mellitus without complications: Secondary | ICD-10-CM

## 2019-02-17 DIAGNOSIS — D649 Anemia, unspecified: Secondary | ICD-10-CM | POA: Diagnosis not present

## 2019-02-17 DIAGNOSIS — E1122 Type 2 diabetes mellitus with diabetic chronic kidney disease: Secondary | ICD-10-CM | POA: Diagnosis not present

## 2019-02-17 DIAGNOSIS — I639 Cerebral infarction, unspecified: Secondary | ICD-10-CM | POA: Diagnosis not present

## 2019-02-17 DIAGNOSIS — Z8673 Personal history of transient ischemic attack (TIA), and cerebral infarction without residual deficits: Secondary | ICD-10-CM

## 2019-02-17 DIAGNOSIS — E785 Hyperlipidemia, unspecified: Secondary | ICD-10-CM | POA: Diagnosis present

## 2019-02-17 DIAGNOSIS — E871 Hypo-osmolality and hyponatremia: Secondary | ICD-10-CM | POA: Diagnosis present

## 2019-02-17 DIAGNOSIS — Z03818 Encounter for observation for suspected exposure to other biological agents ruled out: Secondary | ICD-10-CM | POA: Diagnosis not present

## 2019-02-17 DIAGNOSIS — I130 Hypertensive heart and chronic kidney disease with heart failure and stage 1 through stage 4 chronic kidney disease, or unspecified chronic kidney disease: Secondary | ICD-10-CM | POA: Diagnosis not present

## 2019-02-17 DIAGNOSIS — Z7984 Long term (current) use of oral hypoglycemic drugs: Secondary | ICD-10-CM

## 2019-02-17 DIAGNOSIS — R29700 NIHSS score 0: Secondary | ICD-10-CM | POA: Diagnosis present

## 2019-02-17 DIAGNOSIS — Z833 Family history of diabetes mellitus: Secondary | ICD-10-CM

## 2019-02-17 DIAGNOSIS — Z808 Family history of malignant neoplasm of other organs or systems: Secondary | ICD-10-CM

## 2019-02-17 DIAGNOSIS — Z881 Allergy status to other antibiotic agents status: Secondary | ICD-10-CM

## 2019-02-17 DIAGNOSIS — Z888 Allergy status to other drugs, medicaments and biological substances status: Secondary | ICD-10-CM

## 2019-02-17 DIAGNOSIS — E11319 Type 2 diabetes mellitus with unspecified diabetic retinopathy without macular edema: Secondary | ICD-10-CM | POA: Diagnosis not present

## 2019-02-17 DIAGNOSIS — I5022 Chronic systolic (congestive) heart failure: Secondary | ICD-10-CM | POA: Diagnosis not present

## 2019-02-17 DIAGNOSIS — R778 Other specified abnormalities of plasma proteins: Secondary | ICD-10-CM

## 2019-02-17 DIAGNOSIS — M8580 Other specified disorders of bone density and structure, unspecified site: Secondary | ICD-10-CM | POA: Diagnosis present

## 2019-02-17 DIAGNOSIS — Z9851 Tubal ligation status: Secondary | ICD-10-CM

## 2019-02-17 DIAGNOSIS — M1990 Unspecified osteoarthritis, unspecified site: Secondary | ICD-10-CM | POA: Diagnosis not present

## 2019-02-17 DIAGNOSIS — Z955 Presence of coronary angioplasty implant and graft: Secondary | ICD-10-CM

## 2019-02-17 DIAGNOSIS — R079 Chest pain, unspecified: Secondary | ICD-10-CM | POA: Insufficient documentation

## 2019-02-17 DIAGNOSIS — Z9049 Acquired absence of other specified parts of digestive tract: Secondary | ICD-10-CM

## 2019-02-17 DIAGNOSIS — I214 Non-ST elevation (NSTEMI) myocardial infarction: Secondary | ICD-10-CM

## 2019-02-17 DIAGNOSIS — Z8249 Family history of ischemic heart disease and other diseases of the circulatory system: Secondary | ICD-10-CM

## 2019-02-17 DIAGNOSIS — Z91048 Other nonmedicinal substance allergy status: Secondary | ICD-10-CM

## 2019-02-17 DIAGNOSIS — Z20828 Contact with and (suspected) exposure to other viral communicable diseases: Secondary | ICD-10-CM | POA: Diagnosis present

## 2019-02-17 DIAGNOSIS — M47812 Spondylosis without myelopathy or radiculopathy, cervical region: Secondary | ICD-10-CM | POA: Diagnosis not present

## 2019-02-17 LAB — COMPREHENSIVE METABOLIC PANEL
ALT: 11 U/L (ref 0–44)
AST: 24 U/L (ref 15–41)
Albumin: 3.7 g/dL (ref 3.5–5.0)
Alkaline Phosphatase: 52 U/L (ref 38–126)
Anion gap: 14 (ref 5–15)
BUN: 21 mg/dL (ref 8–23)
CO2: 20 mmol/L — ABNORMAL LOW (ref 22–32)
Calcium: 9.3 mg/dL (ref 8.9–10.3)
Chloride: 96 mmol/L — ABNORMAL LOW (ref 98–111)
Creatinine, Ser: 0.96 mg/dL (ref 0.44–1.00)
GFR calc Af Amer: >60 mL/min (ref >60)
GFR calc non Af Amer: 55 mL/min — ABNORMAL LOW (ref >60)
Glucose, Bld: 141 mg/dL — ABNORMAL HIGH (ref 70–99)
Potassium: 3.9 mmol/L (ref 3.5–5.1)
Sodium: 130 mmol/L — ABNORMAL LOW (ref 135–145)
Total Bilirubin: 0.8 mg/dL (ref 0.3–1.2)
Total Protein: 6.7 g/dL (ref 6.5–8.1)

## 2019-02-17 LAB — URINALYSIS, ROUTINE W REFLEX MICROSCOPIC
Bacteria, UA: NONE SEEN
Bilirubin Urine: NEGATIVE
Glucose, UA: NEGATIVE mg/dL
Hgb urine dipstick: NEGATIVE
Ketones, ur: 5 mg/dL — AB
Leukocytes,Ua: NEGATIVE
Nitrite: NEGATIVE
Protein, ur: 100 mg/dL — AB
Specific Gravity, Urine: 1.017 (ref 1.005–1.030)
pH: 6 (ref 5.0–8.0)

## 2019-02-17 LAB — CBG MONITORING, ED: Glucose-Capillary: 140 mg/dL — ABNORMAL HIGH (ref 70–99)

## 2019-02-17 LAB — CBC
HCT: 32.3 % — ABNORMAL LOW (ref 36.0–46.0)
HCT: 34.9 % — ABNORMAL LOW (ref 36.0–46.0)
Hemoglobin: 10.9 g/dL — ABNORMAL LOW (ref 12.0–15.0)
Hemoglobin: 11.8 g/dL — ABNORMAL LOW (ref 12.0–15.0)
MCH: 31.1 pg (ref 26.0–34.0)
MCH: 31.1 pg (ref 26.0–34.0)
MCHC: 33.7 g/dL (ref 30.0–36.0)
MCHC: 33.8 g/dL (ref 30.0–36.0)
MCV: 92 fL (ref 80.0–100.0)
MCV: 92.1 fL (ref 80.0–100.0)
Platelets: 246 10*3/uL (ref 150–400)
Platelets: 249 10*3/uL (ref 150–400)
RBC: 3.51 MIL/uL — ABNORMAL LOW (ref 3.87–5.11)
RBC: 3.79 MIL/uL — ABNORMAL LOW (ref 3.87–5.11)
RDW: 11.7 % (ref 11.5–15.5)
RDW: 11.7 % (ref 11.5–15.5)
WBC: 10.8 10*3/uL — ABNORMAL HIGH (ref 4.0–10.5)
WBC: 10.5 10*3/uL (ref 4.0–10.5)
nRBC: 0 % (ref 0.0–0.2)
nRBC: 0 % (ref 0.0–0.2)

## 2019-02-17 LAB — DIFFERENTIAL
Abs Immature Granulocytes: 0.04 10*3/uL (ref 0.00–0.07)
Basophils Absolute: 0 10*3/uL (ref 0.0–0.1)
Basophils Relative: 0 %
Eosinophils Absolute: 0.1 10*3/uL (ref 0.0–0.5)
Eosinophils Relative: 1 %
Immature Granulocytes: 0 %
Lymphocytes Relative: 28 %
Lymphs Abs: 3 10*3/uL (ref 0.7–4.0)
Monocytes Absolute: 0.7 10*3/uL (ref 0.1–1.0)
Monocytes Relative: 7 %
Neutro Abs: 6.6 10*3/uL (ref 1.7–7.7)
Neutrophils Relative %: 64 %

## 2019-02-17 LAB — BASIC METABOLIC PANEL
Anion gap: 14 (ref 5–15)
BUN: 20 mg/dL (ref 8–23)
CO2: 19 mmol/L — ABNORMAL LOW (ref 22–32)
Calcium: 9 mg/dL (ref 8.9–10.3)
Chloride: 96 mmol/L — ABNORMAL LOW (ref 98–111)
Creatinine, Ser: 1.11 mg/dL — ABNORMAL HIGH (ref 0.44–1.00)
GFR calc Af Amer: 53 mL/min — ABNORMAL LOW (ref >60)
GFR calc non Af Amer: 46 mL/min — ABNORMAL LOW (ref >60)
Glucose, Bld: 181 mg/dL — ABNORMAL HIGH (ref 70–99)
Potassium: 4.2 mmol/L (ref 3.5–5.1)
Sodium: 129 mmol/L — ABNORMAL LOW (ref 135–145)

## 2019-02-17 LAB — RAPID URINE DRUG SCREEN, HOSP PERFORMED
Amphetamines: NOT DETECTED
Barbiturates: NOT DETECTED
Benzodiazepines: POSITIVE — AB
Cocaine: NOT DETECTED
Opiates: NOT DETECTED
Tetrahydrocannabinol: NOT DETECTED

## 2019-02-17 LAB — APTT: aPTT: 27 seconds (ref 24–36)

## 2019-02-17 LAB — I-STAT CHEM 8, ED
BUN: 21 mg/dL (ref 8–23)
Calcium, Ion: 1.07 mmol/L — ABNORMAL LOW (ref 1.15–1.40)
Chloride: 96 mmol/L — ABNORMAL LOW (ref 98–111)
Creatinine, Ser: 1 mg/dL (ref 0.44–1.00)
Glucose, Bld: 140 mg/dL — ABNORMAL HIGH (ref 70–99)
HCT: 36 % (ref 36.0–46.0)
Hemoglobin: 12.2 g/dL (ref 12.0–15.0)
Potassium: 3.9 mmol/L (ref 3.5–5.1)
Sodium: 129 mmol/L — ABNORMAL LOW (ref 135–145)
TCO2: 21 mmol/L — ABNORMAL LOW (ref 22–32)

## 2019-02-17 LAB — ETHANOL: Alcohol, Ethyl (B): <10 mg/dL (ref <10)

## 2019-02-17 LAB — PROTIME-INR
INR: 1.1 (ref 0.8–1.2)
Prothrombin Time: 14.4 seconds (ref 11.4–15.2)

## 2019-02-17 LAB — GLUCOSE, CAPILLARY: Glucose-Capillary: 134 mg/dL — ABNORMAL HIGH (ref 70–99)

## 2019-02-17 MED ORDER — HYDRALAZINE HCL 50 MG PO TABS: 50.0000 mg | ORAL_TABLET | Freq: Three times a day (TID) | ORAL | Status: DC

## 2019-02-17 MED ORDER — METOPROLOL SUCCINATE ER 100 MG PO TB24: ORAL_TABLET | ORAL | 1 refills | Status: DC

## 2019-02-17 MED ORDER — CHLORTHALIDONE 25 MG PO TABS
25.0000 mg | ORAL_TABLET | ORAL | Status: DC
  Filled 2019-02-17: qty 1

## 2019-02-17 MED ORDER — ATORVASTATIN CALCIUM 20 MG PO TABS: 20.0000 mg | ORAL_TABLET | Freq: Every day | ORAL | 0 refills | Status: DC

## 2019-02-17 MED ORDER — HYDRALAZINE HCL 50 MG PO TABS: 50.0000 mg | ORAL_TABLET | Freq: Three times a day (TID) | ORAL | 2 refills | Status: DC

## 2019-02-17 MED ORDER — NITROGLYCERIN 0.4 MG SL SUBL: 0.4000 mg | SUBLINGUAL_TABLET | SUBLINGUAL | 1 refills | Status: AC | PRN

## 2019-02-17 MED ORDER — METFORMIN HCL 1000 MG PO TABS: 1000.0000 mg | ORAL_TABLET | Freq: Two times a day (BID) | ORAL | 0 refills | Status: DC

## 2019-02-17 MED ORDER — SODIUM CHLORIDE 0.9 % IV BOLUS
1000.0000 mL | Freq: Once | INTRAVENOUS | Status: AC
  Administered 2019-02-17: 1000 mL via INTRAVENOUS

## 2019-02-17 MED ORDER — HYDRALAZINE HCL 20 MG/ML IJ SOLN
10.0000 mg | Freq: Once | INTRAMUSCULAR | Status: AC
  Administered 2019-02-17: 10 mg via INTRAVENOUS
  Filled 2019-02-17: qty 1

## 2019-02-17 MED FILL — hydrALAZINE HCL 50 MG TABS: 50 | 30 days supply | Qty: 90 | Fill #0

## 2019-02-17 MED FILL — ATORVASTATIN CALCIUM 20 MG: 20 | 90 days supply | Qty: 90 | Fill #0

## 2019-02-17 MED FILL — NITROGLYCERIN 0.4 MG TAB SL: 0.4 | 8 days supply | Qty: 25 | Fill #0

## 2019-02-17 MED FILL — METOPROLOL SUCCINATE ER 100: 100 | 90 days supply | Qty: 90 | Fill #0

## 2019-02-17 NOTE — ED Provider Notes (Signed)
MSE was initiated and I personally evaluated the patient and placed orders (if any) at  2:57 PM on February 17, 2019.  I evaluated the patient after fall at home with some report of altered mental status.  Patient returned home from hospitalization this morning in the care of her husband who is also at bedside.  He was working in the other room when he heard a fall.  He went to the other room and saw the patient lying on the floor with a walker on top of her.  He states that she seemed confused.  She was unable to tell him who the president was.  He called EMS.  This occurred at approximately 12:15 PM.  He states that since that time she is returned to normal.  Patient does not appear to be anticoagulated based on medication review.   My assessment shows a patient who is alert and oriented x4.  She has no appreciable neuro deficits to activate a code stroke.  Plan for CT head to rule out bleed along with screening lab work and UA.   The patient appears stable so that the remainder of the MSE may be completed by another provider.   Margette Fast, MD 02/17/19 2009

## 2019-02-17 NOTE — ED Provider Notes (Signed)
[facility] EMERGENCY DEPARTMENT Provider Note   CSN: [id number] Arrival date & time: 02/17/19  1436    History   Chief Complaint Chief Complaint  Patient presents with   Fall    HPI JURI DINNING is a 85 y.o. female with PMH of hypertension and diabetes who was recently discharged from the hospital after heart cath.  Husband states that approximately 12:15 PM patient had a fall and was confused afterwards. Patient reports 1 episode of emesis following fall.  Following fall, patient was unable to state who the president was and took a while to answer where she was.  Patient said she was just about to sit down when she fell.  She does not remember how she fell and does not think she hurt herself.  Husband states patient was given blood pressure medications before leaving the hospital, and did not take any extra doses following discharge.  Patient denies chest pain, shortness of breath, fever, chills, abdominal pain, or any other pain.     HPI  Past Medical History:  Diagnosis Date   Allergy    allergic rhinitis   Anxiety    Arthritis    osteoarthritis   Degenerative disk disease    in neck   Diabetes mellitus    type II   Fracture of femoral neck, left ([place]) 01/21/2018   Hyperlipidemia    Hypertension    Hypoaldosteronism ([place]) 6/12   from DM causing hyperkalemia    Osteopenia    Periodontal disease    Peripheral vascular disease ([place])    Transient cerebral ischemia 11/06/2016    Patient Active Problem List   Diagnosis Date Noted   Demand ischemia ([place]) 02/17/2019   Chest pain 02/17/2019   Elevated troponin 02/17/2019   Non-ST elevation (NSTEMI) myocardial infarction ([place]) 02/16/2019   NSTEMI (non-ST elevated myocardial infarction) ([place])    SVT (supraventricular tachycardia) ([place]) 02/15/2019   Colonic diverticular abscess 06/05/2018   Anemia 06/05/2018   Protein calorie malnutrition ([place]) 06/05/2018   Fecal occult blood  test positive 03/12/2018   Closed fracture of left hip (H[facility])    Chronic kidney disease (CKD), stage II (mild) 01/22/2018   Fracture of femoral neck, left ([place]) 01/21/2018   Depression 01/21/2018   HLD (hyperlipidemia) 01/21/2018   GERD (gastroesophageal reflux disease) 01/21/2018   CAD (coronary artery disease) 01/21/2018   Subcapital fracture of left hip ([place]) 01/21/2018   Carotid stenosis, bilateral 11/20/2016   Transient cerebral ischemia 11/06/2016   Vitamin D deficiency 09/04/2015   Routine general medical examination at a health care facility 09/04/2015   Encounter for Medicare annual wellness exam 05/30/2014   Colon cancer screening 05/30/2014   History of fall 05/26/2013   Gynecological examination 40/04/2724   HELICOBACTER PYLORI INFECTION 12/19/2006   HSV 12/19/2006   Type 2 diabetes mellitus ([place]) 12/19/2006   Diabetic retinopathy ([place]) 12/19/2006   Combined hyperlipidemia associated with type 2 diabetes mellitus ([place]) 12/19/2006   ANXIETY 12/19/2006   HTN (hypertension) 12/19/2006   Coronary atherosclerosis 12/19/2006   Peripheral vascular disease due to secondary diabetes mellitus ([place]) 12/19/2006   ALLERGIC RHINITIS 12/19/2006   OSTEOARTHRITIS 12/19/2006   Osteopenia 12/19/2006    Past Surgical History:  Procedure Laterality Date   CARDIAC CATHETERIZATION     x2 STENT   CHOLECYSTECTOMY     ESOPHAGOGASTRODUODENOSCOPY  03/2004   gastritis by biopsy   GUM SURGERY  06/2010   laser surgery for gums   HIP ARTHROPLASTY Left 01/21/2018  Procedure: ARTHROPLASTY BIPOLAR HIP (HEMIARTHROPLASTY);  Surgeon: Marchia Bond, MD;  Location: [facility];  Service: Orthopedics;  Laterality: Left;   IR RADIOLOGIST EVAL & MGMT  06/17/2018   LEFT HEART CATH AND CORONARY ANGIOGRAPHY N/A 02/16/2019   Procedure: LEFT HEART CATH AND CORONARY ANGIOGRAPHY;  Surgeon: Wellington Hampshire, MD;  Location: [facility];  Service: Cardiovascular;  Laterality:  N/A;   MM BREAST STEREO BX*L*R/S  12/12   normal   TONSILLECTOMY     TRANSTHORACIC ECHOCARDIOGRAM  11/2016   EF 55-60%, grade 1 DD with elevated LV end diastolic filling pressures, mildly dilated LA, mild TR and PR.    TUBAL LIGATION       OB History   No obstetric history on file.      Home Medications    Prior to Admission medications   Medication Sig Start Date End Date Taking? Authorizing Provider  atorvastatin (LIPITOR) 20 MG tablet Take 1 tablet (20 mg total) by mouth daily. 02/17/19   Sande Rives E, PA-C  chlorthalidone (HYGROTON) 25 MG tablet Take 1 tablet by mouth once daily Patient taking differently: Take 25 mg by mouth daily.  12/14/18   Tower, Wynelle Fanny, MD  dipyridamole-aspirin (AGGRENOX) 200-25 MG 12hr capsule Take 1 capsule by mouth twice daily Patient taking differently: Take 1 capsule by mouth 2 (two) times daily.  09/24/18   Tower, Wynelle Fanny, MD  famotidine (PEPCID) 40 MG tablet Take 1 tablet (40 mg total) by mouth daily. Patient taking differently: Take 40 mg by mouth See admin instructions. Take 40 mg by mouth one to two times a day 04/28/18   Thornton Park, MD  glipiZIDE (GLIPIZIDE XL) 10 MG 24 hr tablet TAKE 1 TABLET BY MOUTH ONCE DAILY WITH BREAKFAST Patient taking differently: Take 10 mg by mouth daily with breakfast.  02/16/18   Tower, Wynelle Fanny, MD  hydrALAZINE (APRESOLINE) 50 MG tablet Take 1 tablet (50 mg total) by mouth every 8 (eight) hours. 02/17/19   Darreld Mclean, PA-C  metFORMIN (GLUCOPHAGE) 1000 MG tablet Take 1 tablet (1,000 mg total) by mouth 2 (two) times daily with a meal. 02/18/19   Sande Rives E, PA-C  metoprolol succinate (TOPROL-XL) 100 MG 24 hr tablet TAKE 1 TABLET BY MOUTH ONCE DAILY TAKE  WITH  OR  IMMEDIATELY  FOLLOWING  A  MEAL 02/17/19   Sande Rives E, PA-C  nitroGLYCERIN (NITROSTAT) 0.4 MG SL tablet Place 1 tablet (0.4 mg total) under the tongue every 5 (five) minutes x 3 doses as needed for chest pain. 02/17/19   Sande Rives E, PA-C  PARoxetine (PAXIL) 40 MG tablet Take 1 tablet by mouth daily Patient taking differently: Take 40 mg by mouth at bedtime.  10/02/18   Tower, Wynelle Fanny, MD    Family History Family History  Problem Relation Age of Onset   Cancer Mother        vaginal CA in situ   Hypertension Mother    Heart disease Mother        CAD and MI   Heart disease Father        CAD   Diabetes Father    Heart disease Son        congenital valve problem    Social History Social History   Tobacco Use   Smoking status: Never Smoker   Smokeless tobacco: Never Used  Substance Use Topics   Alcohol use: No    Alcohol/week: 0.0 standard drinks   Drug use:  No     Allergies   Ace inhibitors, Amlodipine besy-benazepril hcl, Clopidogrel bisulfate, Erythromycin, and Tape   Review of Systems Review of Systems  Constitutional: Negative for chills and fever.  Respiratory: Negative for shortness of breath.   Cardiovascular: Negative for chest pain.  Gastrointestinal: Positive for vomiting. Negative for abdominal pain, diarrhea and nausea.  All other systems reviewed and are negative.    Physical Exam Updated Vital Signs BP (!) 141/78    Pulse 72    Temp 98.6 F (37 C) (Oral)    Resp 15    SpO2 99%   Physical Exam Constitutional:      Appearance: Normal appearance.  HENT:     Head: Normocephalic and atraumatic.     Mouth/Throat:     Mouth: Mucous membranes are moist.     Pharynx: No oropharyngeal exudate or posterior oropharyngeal erythema.  Eyes:     Extraocular Movements: Extraocular movements intact.     Pupils: Pupils are equal, round, and reactive to light.  Pulmonary:     Effort: Pulmonary effort is normal.     Breath sounds: Normal breath sounds. No wheezing or rhonchi.  Abdominal:     General: Abdomen is flat. Bowel sounds are normal. There is no distension.     Palpations: Abdomen is soft.     Tenderness: There is no abdominal tenderness.  Neurological:      Mental Status: She is alert and oriented to person, place, and time.     Cranial Nerves: Cranial nerve deficit present.     Sensory: No sensory deficit.     Motor: No weakness.     Coordination: Coordination normal.     Comments: Patient follows commands slowly and sometimes needs commands repeated. Per husband, this is new.  Mild drooping of left side of mouth when smiling  Psychiatric:        Mood and Affect: Mood normal.      ED Treatments / Results  Labs (all labs ordered are listed, but only abnormal results are displayed) Labs Reviewed  CBC - Abnormal; Notable for the following components:      Result Value   RBC 3.79 (*)    Hemoglobin 11.8 (*)    HCT 34.9 (*)    All other components within normal limits  COMPREHENSIVE METABOLIC PANEL - Abnormal; Notable for the following components:   Sodium 130 (*)    Chloride 96 (*)    CO2 20 (*)    Glucose, Bld 141 (*)    GFR calc non Af Amer 55 (*)    All other components within normal limits  RAPID URINE DRUG SCREEN, HOSP PERFORMED - Abnormal; Notable for the following components:   Benzodiazepines POSITIVE (*)    All other components within normal limits  URINALYSIS, ROUTINE W REFLEX MICROSCOPIC - Abnormal; Notable for the following components:   APPearance HAZY (*)    Ketones, ur 5 (*)    Protein, ur 100 (*)    All other components within normal limits  CBG MONITORING, ED - Abnormal; Notable for the following components:   Glucose-Capillary 140 (*)    All other components within normal limits  I-STAT CHEM 8, ED - Abnormal; Notable for the following components:   Sodium 129 (*)    Chloride 96 (*)    Glucose, Bld 140 (*)    Calcium, Ion 1.07 (*)    TCO2 21 (*)    All other components within normal limits  SARS CORONAVIRUS 2  ETHANOL  PROTIME-INR  APTT  DIFFERENTIAL    EKG EKG Interpretation  Date/Time:  Wednesday February 17 2019 15:23:06 EDT Ventricular Rate:  60 PR Interval:    QRS Duration: 87 QT  Interval:  518 QTC Calculation: 518 R Axis:   45 Text Interpretation:  Sinus rhythm Minimal ST elevation, anterior leads Prolonged QT interval No significant change since last tracing Confirmed by Deno Etienne [id number]) on 02/17/2019 5:39:36 PM   Radiology Ct Head Wo Contrast  Result Date: 02/17/2019 CLINICAL DATA:  Altered mental status following a fall today. EXAM: CT HEAD WITHOUT CONTRAST CT CERVICAL SPINE WITHOUT CONTRAST TECHNIQUE: Multidetector CT imaging of the head and cervical spine was performed following the standard protocol without intravenous contrast. Multiplanar CT image reconstructions of the cervical spine were also generated. COMPARISON:  Head and cervical spine CT report dated 01/21/2018 and head CT dated 03/28/2017. FINDINGS: CT HEAD FINDINGS Brain: No significant change in mild-to-moderate enlargement of the ventricles and subarachnoid spaces. Moderate patchy white matter low density in both cerebral hemispheres with mild progression. No intracranial hemorrhage, mass lesion or CT evidence of acute infarction. Vascular: No hyperdense vessel or unexpected calcification. Skull: Normal. Negative for fracture or focal lesion. Sinuses/Orbits: Unremarkable. Other: None. CT CERVICAL SPINE FINDINGS Alignment: Mild dextroconvex cervicothoracic scoliosis. No subluxations. Skull base and vertebrae: No acute fracture. No primary bone lesion or focal pathologic process. Soft tissues and spinal canal: No prevertebral fluid or swelling. No visible canal hematoma. Disc levels: Multilevel degenerative changes, most pronounced at the C5-6 and C6-7 levels. Upper chest: Minimal biapical pleural and parenchymal scarring. Other: 1.2 cm right lobe thyroid nodule an additional smaller poorly defined nodules. Dense bilateral carotid artery calcifications. IMPRESSION: 1. No skull fracture or intracranial hemorrhage. 2. No cervical spine fracture or subluxation. 3. Stable mild-to-moderate diffuse cerebral and cerebellar  atrophy. 4. Moderate small vessel white matter ischemic changes in both cerebral hemispheres with mild progression. 5. Multilevel cervical spine degenerative changes. 6. Dense bilateral carotid artery atheromatous calcifications. 7. Multinodular thyroid gland with a 1.2 cm heterogeneous nodule on the right. Consider further evaluation with elective thyroid ultrasound. If patient is clinically hyperthyroid, consider nuclear medicine thyroid uptake and scan. Electronically Signed   By: Claudie Revering M.D.   On: 02/17/2019 17:58   Ct Cervical Spine Wo Contrast  Result Date: 02/17/2019 CLINICAL DATA:  Altered mental status following a fall today. EXAM: CT HEAD WITHOUT CONTRAST CT CERVICAL SPINE WITHOUT CONTRAST TECHNIQUE: Multidetector CT imaging of the head and cervical spine was performed following the standard protocol without intravenous contrast. Multiplanar CT image reconstructions of the cervical spine were also generated. COMPARISON:  Head and cervical spine CT report dated 01/21/2018 and head CT dated 03/28/2017. FINDINGS: CT HEAD FINDINGS Brain: No significant change in mild-to-moderate enlargement of the ventricles and subarachnoid spaces. Moderate patchy white matter low density in both cerebral hemispheres with mild progression. No intracranial hemorrhage, mass lesion or CT evidence of acute infarction. Vascular: No hyperdense vessel or unexpected calcification. Skull: Normal. Negative for fracture or focal lesion. Sinuses/Orbits: Unremarkable. Other: None. CT CERVICAL SPINE FINDINGS Alignment: Mild dextroconvex cervicothoracic scoliosis. No subluxations. Skull base and vertebrae: No acute fracture. No primary bone lesion or focal pathologic process. Soft tissues and spinal canal: No prevertebral fluid or swelling. No visible canal hematoma. Disc levels: Multilevel degenerative changes, most pronounced at the C5-6 and C6-7 levels. Upper chest: Minimal biapical pleural and parenchymal scarring. Other: 1.2  cm right lobe thyroid nodule an additional smaller poorly defined nodules. Dense  bilateral carotid artery calcifications. IMPRESSION: 1. No skull fracture or intracranial hemorrhage. 2. No cervical spine fracture or subluxation. 3. Stable mild-to-moderate diffuse cerebral and cerebellar atrophy. 4. Moderate small vessel white matter ischemic changes in both cerebral hemispheres with mild progression. 5. Multilevel cervical spine degenerative changes. 6. Dense bilateral carotid artery atheromatous calcifications. 7. Multinodular thyroid gland with a 1.2 cm heterogeneous nodule on the right. Consider further evaluation with elective thyroid ultrasound. If patient is clinically hyperthyroid, consider nuclear medicine thyroid uptake and scan. Electronically Signed   By: Claudie Revering M.D.   On: 02/17/2019 17:58   Mr Brain Wo Contrast  Result Date: 02/17/2019 CLINICAL DATA:  TIA. Recent fall. Cardiac catheterization 02/16/2019 EXAM: MRI HEAD WITHOUT CONTRAST TECHNIQUE: Multiplanar, multiecho pulse sequences of the brain and surrounding structures were obtained without intravenous contrast. COMPARISON:  CT headache 11/2018 FINDINGS: Brain: Limited study. The patient was moving and not able to complete the study. Small focus of acute infarct in the left posterior temporal lobe measuring 5 mm. No other acute infarct Moderate atrophy. Chronic ischemic changes in the white matter, basal ganglia, and pons bilaterally. Negative for hemorrhage or fluid collection. Vascular: Normal arterial flow voids. IMPRESSION: Motion degraded and incomplete study 5 mm acute infarct left posterior temporal lobe Moderate atrophy and moderate chronic ischemic changes. Electronically Signed   By: Franchot Gallo M.D.   On: 02/17/2019 21:26    Procedures Procedures (including critical care time)  Medications Ordered in ED Medications  sodium chloride 0.9 % bolus 1,000 mL (0 mLs Intravenous Stopped 02/17/19 1933)     Initial Impression /  Assessment and Plan / ED Course  I have reviewed the triage vital signs and the nursing notes.  Pertinent labs & imaging results that were available during my care of the patient were reviewed by me and considered in my medical decision making (see chart for details).       Ms. Gravlin is an 85 year old female past medical history of diabetes and hypertension who was discharged earlier today after heart cath and now presents after a fall with altered mental status and mild left sided facial droop. CT head and cervical spine did not reveal skull fracture, cervical spine fracture, or intracranial hemorrhage.  Case discussed with Dr. Leonel Ramsay and MR brain obtained which revealed a 5 mm acute infarct in left posterior temporal lobe. Dr. Lorraine Lax with neurology was consulted. Patient had hyponatremia to 129 and was given normal saline bolus.  Patient had no urinary symptoms and UA was leukocyte/nitrate negative.   Final Clinical Impressions(s) / ED Diagnoses   Final diagnoses:  None    ED Discharge Orders    None       Jeanmarie Hubert, MD 02/17/19 [place], [place], DO 02/17/19 2332

## 2019-02-17 NOTE — Discharge Instructions (Signed)
-   Restart Metformin 48 hours after cardiac catheterization, which would be tomorrow evening (02/18/2019). - Please take all medications as directed elsewhere on discharge summary. - Please check your blood pressure daily and keep a log of this to bring your upcoming office visit with your primary care physician and your next visit with [place]. - Please keep appointment with your primary care physician on 02/22/2019. We will ask them to recheck your electrolytes and kidney function at that time.  Post Cardiac Catheterization: NO HEAVY LIFTING OR SEXUAL ACTIVITY X 7 DAYS. NO DRIVING X 2-3 DAYS. NO SOAKING BATHS, HOT TUBS, POOLS, ETC., X 7 DAYS.  Radial Site Care: Refer to this sheet in the next few weeks. These instructions provide you with information on caring for yourself after your procedure. Your caregiver may also give you more specific instructions. Your treatment has been planned according to current medical practices, but problems sometimes occur. Call your caregiver if you have any problems or questions after your procedure. HOME CARE INSTRUCTIONS  You may shower the day after the procedure.Remove the bandage (dressing) and gently wash the site with plain soap and water.Gently pat the site dry.   Do not apply powder or lotion to the site.   Do not submerge the affected site in water for 3 to 5 days.   Inspect the site at least twice daily.   Do not flex or bend the affected arm for 24 hours.   No lifting over 5 pounds (2.3 kg) for 5 days after your procedure.   Do not drive home if you are discharged the same day of the procedure. Have someone else drive you.  What to expect:  Any bruising will usually fade within 1 to 2 weeks.   Blood that collects in the tissue (hematoma) may be painful to the touch. It should usually decrease in size and tenderness within 1 to 2 weeks.  SEEK IMMEDIATE MEDICAL CARE IF:  You have unusual pain at the radial site.   You have redness, warmth,  swelling, or pain at the radial site.   You have drainage (other than a small amount of blood on the dressing).   You have chills.   You have a fever or persistent symptoms for more than 72 hours.   You have a fever and your symptoms suddenly get worse.   Your arm becomes pale, cool, tingly, or numb.   You have heavy bleeding from the site. Hold pressure on the site.

## 2019-02-17 NOTE — Progress Notes (Addendum)
Subjective:  BP elevated mild nausea. Able to keep pills down Comfortable reading news paper in bed   Objective:  Vitals:   02/16/19 2313 02/17/19 0407 02/17/19 0745 02/17/19 0756  BP: (!) 165/76 (!) 197/87 (!) 222/87 (!) 200/88  Pulse: 65 64    Resp: 19 16  16   Temp: 98.5 F (36.9 C) 98.4 F (36.9 C)  98.4 F (36.9 C)  TempSrc: Oral Oral  Oral  SpO2: 98% 98%  98%  Weight:      Height:        Intake/Output from previous day:  Intake/Output Summary (Last 24 hours) at 02/17/2019 0825 Last data filed at 02/17/2019 0542 Gross per 24 hour  Intake 10 ml  Output 700 ml  Net -690 ml    Physical Exam:  Affect appropriate Pale elderly female  HEENT: normal Neck supple with no adenopathy JVP normal no bruits no thyromegaly Lungs clear with no wheezing and good diaphragmatic motion Heart:  S1/S2 no murmur, no rub, gallop or click PMI normal Abdomen: benighn, BS positve, no tenderness, no AAA no bruit.  No HSM or HJR Distal pulses intact with no bruits No edema Neuro non-focal Skin warm and dry No muscular weakness   Lab Results: Basic Metabolic Panel: Recent Labs    02/15/19 1601 02/15/19 1801 02/16/19 0219  NA 137  --  134*  K 4.2  --  4.1  CL 101  --  100  CO2 25  --  22  GLUCOSE 82  --  106*  BUN 21  --  20  CREATININE 1.04*  --  0.99  CALCIUM 9.7  --  9.4  MG  --  1.6*  --    Liver Function Tests: No results for input(s): AST, ALT, ALKPHOS, BILITOT, PROT, ALBUMIN in the last 72 hours. No results for input(s): LIPASE, AMYLASE in the last 72 hours. CBC: Recent Labs    02/16/19 0219 02/17/19 0236  WBC 10.3 10.8*  HGB 11.1* 10.9*  HCT 31.9* 32.3*  MCV 91.4 92.0  PLT 226 246   Fasting Lipid Panel: Recent Labs    02/16/19 0219  CHOL 116  HDL 39*  LDLCALC 25  TRIG 259*  CHOLHDL 3.0   Thyroid Function Tests: Recent Labs    02/16/19 0219  TSH 1.406   Anemia Panel: No results for input(s): VITAMINB12, FOLATE, FERRITIN, TIBC, IRON,  RETICCTPCT in the last 72 hours.  Imaging: Dg Chest 2 View  Result Date: 02/15/2019 CLINICAL DATA:  Chest pain with SVT EXAM: CHEST - 2 VIEW COMPARISON:  06/04/2018, 11/11/2017, 09/25/2009 FINDINGS: Left lower calcifications. No acute airspace disease or effusion mild asymmetric opacity in the left apex. Stable cardiomediastinal silhouette with aortic atherosclerosis. No pneumothorax. Stable ovoid calcification in the left upper quadrant. IMPRESSION: No active cardiopulmonary disease. Summation shadow versus nodule in the left lung apex. Further evaluation with chest CT could be obtained. Electronically Signed   By: Donavan Foil M.D.   On: 02/15/2019 16:57    Cardiac Studies:  ECG: SR nonspecific ST changes    Telemetry:  NSR rates in 60's   Echo:   Medications:    aspirin EC  81 mg Oral Daily   atorvastatin  20 mg Oral QHS   dipyridamole-aspirin  1 capsule Oral BID   famotidine  40 mg Oral Daily   hydrALAZINE  10 mg Intravenous Once   insulin aspart  0-9 Units Subcutaneous TID WC   metoprolol succinate  100 mg Oral  Daily   PARoxetine  40 mg Oral QHS   sodium chloride flush  3 mL Intravenous Once   sodium chloride flush  3 mL Intravenous Q12H   sodium chloride flush  3 mL Intravenous Q12H   sodium chloride flush  3 mL Intravenous Q12H      sodium chloride     sodium chloride      Assessment/Plan:   SVT:  Resolved with adenosine continue Toprol Patient had run out of this medicine since last week prior to admission   CAD:  Angina during SVT with elevated troponin 3935  For cath today with Dr Fletcher Anon History of LAD/D1 stents in 2005. High likelyhood of restenosis or de novo lesions Continue heparin   HLD:  On statin   HTN:  Had iv hydralazine this am Add home diuretic and hydralazine 50 mg tid Has ACE and norvasc listed as allergies but latter is ? "chest pain"    Ok to d/c home latter today if BP down and taking PO  Jenkins Rouge 02/17/2019, 8:25 AM

## 2019-02-17 NOTE — ED Notes (Signed)
Pt sitting in bed with legs through sides.  I feel pt cannot be left in room alone.  Will move to hallway.

## 2019-02-17 NOTE — ED Notes (Signed)
Swallow Screen not competed as pt had difficulties following directions.

## 2019-02-17 NOTE — Progress Notes (Signed)
   Paged about patient's BP of 222/87. RN states patient is nauseous but denies any pain. PRN IV Zofran was given. RN also gave morning dose of Toprol early. RN states heart rates in the high 50's on telemetry. Will give one dose of IV Hydralazine 10mg  now. Patient also on Chlorthalidone 25mg  daily and Amlodipine 5mg  daily at home but these were held on admission. Will also likely restart these after seeing patient.  Darreld Mclean, PA-C 02/17/2019 8:14 AM

## 2019-02-17 NOTE — Discharge Summary (Addendum)
Discharge Summary    Patient ID: Stephanie [name] MRN: [id number]; DOB: 01-25-1934  Admit date: 02/15/2019 Discharge date: 02/17/2019  Primary Care Provider: Abner Greenspan, MD  Primary Cardiologist: Stephanie Rogue, MD  Primary Electrophysiologist:  None   Discharge Diagnoses    Principal Problem:   Demand ischemia [facility]) Active Problems:   Type 2 diabetes mellitus ([place])   HTN (hypertension)   HLD (hyperlipidemia)   CAD (coronary artery disease)   SVT (supraventricular tachycardia) (HCC)   Chest pain   Elevated troponin   Allergies Allergies  Allergen Reactions  . Ace Inhibitors Other (See Comments)    Chest pain  . Amlodipine Besy-Benazepril Hcl Other (See Comments)    Chest pain  . Clopidogrel Bisulfate Other (See Comments)    GI upset  . Erythromycin Other (See Comments)    GI upset  . Tape Other (See Comments)    Causes soreness (of the skin)    Diagnostic Studies/Procedures    Echocardiogram 02/16/2019:  Impressions:  1. Severe hypokinesis of the left ventricular, basal-mid inferoseptal wall, inferior wall and inferolateral wall.  2. The left ventricle has mildly reduced systolic function, with an ejection fraction of 45-50%. The cavity size was normal. Left ventricular diastolic Doppler parameters are consistent with impaired relaxation. Elevated mean left atrial pressure.  3. The right ventricle has normal systolic function. The cavity was normal. There is no increase in right ventricular wall thickness. Right ventricular systolic pressure could not be assessed.  4. Left atrial size was severely dilated.  5. Mild thickening of the mitral valve leaflet. Mild calcification of the mitral valve leaflet.  6. The aorta is normal in size and structure.  7. The aortic root is normal in size and structure.  8. Cannot exclude left to right shunt.  _______________  Left Heart Catheterization 02/16/2019:   Mid LM to Dist LM lesion is 50% stenosed.   Mid Cx lesion  is 40% stenosed.   Dist Cx lesion is 50% stenosed.   Ost LAD lesion is 40% stenosed.   Prox LAD to Mid LAD lesion is 40% stenosed.   1st Diag lesion is 60% stenosed.    1. Left dominant system. Patent LAD stent with moderate restenosis and moderate left main and left circumflex disease. No evidence of obstructive disease.  2. Left ventricular angiography was not performed due to chronic kidney disease. EF was mildly reduced by echo.  3. Normal left ventricular end-diastolic pressure.    Recommendations:  Continue medical therapy for CAD.  Elevated troponin was likely due to demand ischemia in the setting of tachycardia.    History of Present Illness     [name] is a 85 year old female with a past medical history of remote CAD s/p stenting to LAD/D1 in 2005, remote CVA on Aggrenox, hypertension, hyperlipidemia, and type 2 diabetes mellitus who is followed by Dr. Rockey [name]. Patient presented to the ED via EMS on 02/15/2019 for evaluation of palpitations and chest pain. Patient was cleaning bird feeders at home on day of presentation when she had a sudden onset of rapid palpitations and substernal chest pain. Symptoms reportedly lasted for about 60-90 minutes. No presyncope. 911 was called and when EMS arrived patient was noted to be in rapid SVT with rate of 196 bpm. EMS gave 6mg  of Adenosine and patient converted to normal sinus rhythm. Chest pressure then slowly eased off. At the time of initial evaluation in the ED, patient chest pain free. High-sensitivity troponin elevated at  975. Post conversion EKG showed no acute ST changes. Patient ran out of her home Toprol about 1 week prior to presentation. Prior to this episode, patient had not been having any angina. Most recent Echo from 11/2016 showed normal EF. Patient was admitted for further evaluation of chest pain with elevated troponin.   Hospital Course     Consultants: None.  Demand Ischemia  Patient admitted for further  evaluation of chest pain. High-sensitivity troponin jumped from to 975 to 3,935. Echo showed LVEF of 45-50% with severe hypokinesis of the basal-mid inferoseptal, inferior, and inferolateral walls as well as grade 1 diastolic function and elevated mean left atrial pressure. Patient underwent left heart catheterization which showed patent LAD stent with moderate restenosis and moderate left main and left circumflex disease but no obstructive disease. LVEDP was normal. Elevated troponin felt to be due to demand ischemia in setting of SVT. Patient tolerated the procedure well. Right radial cath site with some bruising but no significant hematoma. Renal function stable. Will continue medical therapy for CAD.  CAD Continue Aggrenox 200-25mg  twice daily (which she has taken since prior CVA), beta-blocker, and statin.  SVT  No additional episodes of SVT since converting with Adenosine. Continue home Toprol-XL 100mg  daily. Patient had run out of this medication 1 week prior to admission. Will make sure patient has refills.  Hypertension  BP has been very elevated with systolic BP as high as 353 on morning of discharge. Patient received one dose of IV Hydralazine 10mg  with improvement of BP. Will restart home Chlorthalidone 25mg  daily and add Hydralazine 50mg  three times daily at discharge.  Hyperlipidemia Lipid panel this admission: Cholesterol 116, Triglycerides 259, HDL 39, LDL 25. AT LDL goal of <70 given CAD. Continue home Lipitor 20mg  daily.  Type 2 Diabetes Mellitus Last Hemoglobin A1c from 08/2018 was 5.8. Patient on Metformin and Glipizide at home. Will resume home medications at discharge. Metformin can be restarted 48 hours after cardiac catheterization.  Chronic Anemia Hemoglobin stable at 10.9. Baseline appears to be around 9 to 11 range. Will defer any additional work-up to PCP.  Abnormal Chest X-Ray Initial chest x-ray showed mild asymmetric opacity in the left apex which was read as a  possible summation shadow vs nodule. Dr. Johnsie [name] recommended considering repeat PA and Lateral chest x-ray as outpatient before getting a CT scan. Will defer to outpatient follow up either with [place] or PCP.  Of note, repeat BMET on morning of discharge showed Na of 129 (possibly due to decreased PO intake around cardiac catheterization) and creatinine of 1.11 (only slightly above baseline). Patient has appointment with PCP on Monday 02/22/2019. Will ask PCP to recheck labs at that time.   Patient seen and examined by Dr. Johnsie [name] and was determined to be stable for discharge. Outpatient follow-up has been arranged. Medications as below.  _____________  Discharge Vitals Blood pressure (!) 141/58, pulse 64, temperature 98.4 F (36.9 C), temperature source Oral, resp. rate (!) 22, height 5\' 1"  (1.549 m), weight 49.1 kg, SpO2 98 %.  Filed Weights   02/15/19 1730 02/15/19 2035 02/16/19 0405  Weight: 47.6 kg 49.5 kg 49.1 kg    Labs & Radiologic Studies    CBC Recent Labs    02/16/19 0219 02/17/19 0236  WBC 10.3 10.8*  HGB 11.1* 10.9*  HCT 31.9* 32.3*  MCV 91.4 92.0  PLT 226 299   Basic Metabolic Panel Recent Labs    02/15/19 1801 02/16/19 0219 02/17/19 0911  NA  --  134* 129*  K  --  4.1 4.2  CL  --  100 96*  CO2  --  22 19*  GLUCOSE  --  106* 181*  BUN  --  20 20  CREATININE  --  0.99 1.11*  CALCIUM  --  9.4 9.0  MG 1.6*  --   --    Liver Function Tests No results for input(s): AST, ALT, ALKPHOS, BILITOT, PROT, ALBUMIN in the last 72 hours. No results for input(s): LIPASE, AMYLASE in the last 72 hours. Cardiac Enzymes No results for input(s): CKTOTAL, CKMB, CKMBINDEX, TROPONINI in the last 72 hours. BNP Invalid input(s): POCBNP D-Dimer No results for input(s): DDIMER in the last 72 hours. Hemoglobin A1C No results for input(s): HGBA1C in the last 72 hours. Fasting Lipid Panel Recent Labs    02/16/19 0219  CHOL 116  HDL 39*  LDLCALC 25  TRIG 259*  CHOLHDL 3.0    Thyroid Function Tests Recent Labs    02/16/19 0219  TSH 1.406   _____________  Dg Chest 2 View  Result Date: 02/15/2019 CLINICAL DATA:  Chest pain with SVT EXAM: CHEST - 2 VIEW COMPARISON:  06/04/2018, 11/11/2017, 09/25/2009 FINDINGS: Left lower calcifications. No acute airspace disease or effusion mild asymmetric opacity in the left apex. Stable cardiomediastinal silhouette with aortic atherosclerosis. No pneumothorax. Stable ovoid calcification in the left upper quadrant. IMPRESSION: No active cardiopulmonary disease. Summation shadow versus nodule in the left lung apex. Further evaluation with chest CT could be obtained. Electronically Signed   By: Donavan Foil M.D.   On: 02/15/2019 16:57   Disposition   Patient is being discharged home today in good condition.  Follow-up Plans & Appointments    Follow-up Information    Theora Gianotti, NP Follow up.   Specialties: Nurse Practitioner, Cardiology, Radiology Why: You have a follow-up visit scheduled for 03/10/2019 at 3:30pm with Ignacia Bayley, one of Dr. Donivan Scull NPs. Pleaser arrive 15 minutes early for check-in. If this date/time does not work for you, please call our office to reschedule. Contact information: [address] [place] [address] [phone]        Stephanie Greenspan, MD Follow up.   Specialties: Family Medicine, Radiology Why: You have an upcoming appointment with Dr. Glori Bickers on 02/22/2019 at 2:00pm. Please keep this appointment.  Contact information: [place] [address] [phone]          Discharge Instructions    Diet - low sodium heart healthy   Complete by: As directed    Increase activity slowly   Complete by: As directed       Discharge Medications   Allergies as of 02/17/2019      Reactions   Ace Inhibitors Other (See Comments)   Chest pain   Amlodipine Besy-benazepril Hcl Other (See Comments)   Chest pain   Clopidogrel Bisulfate Other (See  Comments)   GI upset   Erythromycin Other (See Comments)   GI upset   Tape Other (See Comments)   Causes soreness (of the skin)      Medication List    STOP taking these medications   amLODipine 5 MG tablet Commonly known as: NORVASC   aspirin EC 325 MG tablet     TAKE these medications   atorvastatin 20 MG tablet Commonly known as: LIPITOR Take 1 tablet (20 mg total) by mouth daily. What changed: when to take this   chlorthalidone 25 MG tablet Commonly known as:  HYGROTON Take 1 tablet by mouth once daily   dipyridamole-aspirin 200-25 MG 12hr capsule Commonly known as: AGGRENOX Take 1 capsule by mouth twice daily   famotidine 40 MG tablet Commonly known as: PEPCID Take 1 tablet (40 mg total) by mouth daily. What changed:   when to take this  additional instructions   glipiZIDE 10 MG 24 hr tablet Commonly known as: glipiZIDE XL TAKE 1 TABLET BY MOUTH ONCE DAILY WITH BREAKFAST What changed:   how much to take  how to take this  when to take this  additional instructions   hydrALAZINE 50 MG tablet Commonly known as: APRESOLINE Take 1 tablet (50 mg total) by mouth every 8 (eight) hours.   metFORMIN 1000 MG tablet Commonly known as: GLUCOPHAGE Take 1 tablet (1,000 mg total) by mouth 2 (two) times daily with a meal. Start taking on: February 18, 2019   metoprolol succinate 100 MG 24 hr tablet Commonly known as: TOPROL-XL TAKE 1 TABLET BY MOUTH ONCE DAILY TAKE  WITH  OR  IMMEDIATELY  FOLLOWING  A  MEAL What changed:   how much to take  how to take this  when to take this  additional instructions   nitroGLYCERIN 0.4 MG SL tablet Commonly known as: NITROSTAT Place 1 tablet (0.4 mg total) under the tongue every 5 (five) minutes x 3 doses as needed for chest pain.   PARoxetine 40 MG tablet Commonly known as: PAXIL Take 1 tablet by mouth daily What changed: when to take this        Acute coronary syndrome (MI, NSTEMI, STEMI, etc) this  admission?:  No.  The elevated Troponin was due to the acute medical illness or demand ischemia.    Outstanding Labs/Studies   Repeat BMET at PCP's office on 02/22/2019.  Duration of Discharge Encounter   Greater than 30 minutes including physician time.  Signed, Darreld Mclean, PA-C 02/17/2019, 10:18 AM

## 2019-02-17 NOTE — Progress Notes (Signed)
Paged Dr. Kalman Shan. Alert that pt's BP 197/87 HR ranged 58-62 during the night. Pt asymptomatic. No PRN or scheduled BP meds. Dr. Kalman Shan stated no new orders at this time.

## 2019-02-17 NOTE — ED Triage Notes (Addendum)
Pt BIB [facility] following a fall. Per EMS patient was discharged today from the hospital after she had an inpatient heart catheterization. Per EMS patient was acting her normal self when she was discharged at 1130. Family reported they walked outside and when they returned inside patient was lying on the floor with her walker on top of her. Reported she was not acting herself and was confused at that time. EMS reports patient seemed "off" was able to answer orientation questions but was not always able to follow commands. Pt arrived to ED alert and oriented x4.

## 2019-02-18 ENCOUNTER — Encounter (HOSPITAL_COMMUNITY): Payer: Self-pay | Admitting: Internal Medicine

## 2019-02-18 ENCOUNTER — Observation Stay (HOSPITAL_COMMUNITY): Payer: Medicare Other

## 2019-02-18 DIAGNOSIS — E1151 Type 2 diabetes mellitus with diabetic peripheral angiopathy without gangrene: Secondary | ICD-10-CM | POA: Diagnosis present

## 2019-02-18 DIAGNOSIS — I248 Other forms of acute ischemic heart disease: Secondary | ICD-10-CM | POA: Diagnosis present

## 2019-02-18 DIAGNOSIS — I639 Cerebral infarction, unspecified: Secondary | ICD-10-CM

## 2019-02-18 DIAGNOSIS — D649 Anemia, unspecified: Secondary | ICD-10-CM | POA: Diagnosis present

## 2019-02-18 DIAGNOSIS — Z96642 Presence of left artificial hip joint: Secondary | ICD-10-CM | POA: Diagnosis present

## 2019-02-18 DIAGNOSIS — R29700 NIHSS score 0: Secondary | ICD-10-CM | POA: Diagnosis present

## 2019-02-18 DIAGNOSIS — Z7984 Long term (current) use of oral hypoglycemic drugs: Secondary | ICD-10-CM | POA: Diagnosis not present

## 2019-02-18 DIAGNOSIS — E11319 Type 2 diabetes mellitus with unspecified diabetic retinopathy without macular edema: Secondary | ICD-10-CM | POA: Diagnosis not present

## 2019-02-18 DIAGNOSIS — R29810 Facial weakness: Secondary | ICD-10-CM | POA: Diagnosis present

## 2019-02-18 DIAGNOSIS — I2510 Atherosclerotic heart disease of native coronary artery without angina pectoris: Secondary | ICD-10-CM | POA: Diagnosis present

## 2019-02-18 DIAGNOSIS — Z79899 Other long term (current) drug therapy: Secondary | ICD-10-CM | POA: Diagnosis not present

## 2019-02-18 DIAGNOSIS — N182 Chronic kidney disease, stage 2 (mild): Secondary | ICD-10-CM | POA: Diagnosis not present

## 2019-02-18 DIAGNOSIS — I5022 Chronic systolic (congestive) heart failure: Secondary | ICD-10-CM | POA: Diagnosis present

## 2019-02-18 DIAGNOSIS — I471 Supraventricular tachycardia: Secondary | ICD-10-CM | POA: Diagnosis present

## 2019-02-18 DIAGNOSIS — Z9851 Tubal ligation status: Secondary | ICD-10-CM | POA: Diagnosis not present

## 2019-02-18 DIAGNOSIS — Z20828 Contact with and (suspected) exposure to other viral communicable diseases: Secondary | ICD-10-CM | POA: Diagnosis present

## 2019-02-18 DIAGNOSIS — I6340 Cerebral infarction due to embolism of unspecified cerebral artery: Secondary | ICD-10-CM | POA: Diagnosis present

## 2019-02-18 DIAGNOSIS — E871 Hypo-osmolality and hyponatremia: Secondary | ICD-10-CM | POA: Diagnosis present

## 2019-02-18 DIAGNOSIS — E1122 Type 2 diabetes mellitus with diabetic chronic kidney disease: Secondary | ICD-10-CM | POA: Diagnosis present

## 2019-02-18 DIAGNOSIS — M1990 Unspecified osteoarthritis, unspecified site: Secondary | ICD-10-CM | POA: Diagnosis present

## 2019-02-18 DIAGNOSIS — M8580 Other specified disorders of bone density and structure, unspecified site: Secondary | ICD-10-CM | POA: Diagnosis present

## 2019-02-18 DIAGNOSIS — E861 Hypovolemia: Secondary | ICD-10-CM | POA: Diagnosis present

## 2019-02-18 DIAGNOSIS — I10 Essential (primary) hypertension: Secondary | ICD-10-CM | POA: Diagnosis not present

## 2019-02-18 DIAGNOSIS — E785 Hyperlipidemia, unspecified: Secondary | ICD-10-CM | POA: Diagnosis present

## 2019-02-18 DIAGNOSIS — Z8673 Personal history of transient ischemic attack (TIA), and cerebral infarction without residual deficits: Secondary | ICD-10-CM | POA: Diagnosis not present

## 2019-02-18 DIAGNOSIS — I130 Hypertensive heart and chronic kidney disease with heart failure and stage 1 through stage 4 chronic kidney disease, or unspecified chronic kidney disease: Secondary | ICD-10-CM | POA: Diagnosis present

## 2019-02-18 LAB — BASIC METABOLIC PANEL
Anion gap: 10 (ref 5–15)
BUN: 20 mg/dL (ref 8–23)
CO2: 25 mmol/L (ref 22–32)
Calcium: 9.1 mg/dL (ref 8.9–10.3)
Chloride: 94 mmol/L — ABNORMAL LOW (ref 98–111)
Creatinine, Ser: 1.09 mg/dL — ABNORMAL HIGH (ref 0.44–1.00)
GFR calc Af Amer: 54 mL/min — ABNORMAL LOW (ref >60)
GFR calc non Af Amer: 47 mL/min — ABNORMAL LOW (ref >60)
Glucose, Bld: 79 mg/dL (ref 70–99)
Potassium: 3.7 mmol/L (ref 3.5–5.1)
Sodium: 129 mmol/L — ABNORMAL LOW (ref 135–145)

## 2019-02-18 LAB — COMPREHENSIVE METABOLIC PANEL
ALT: 11 U/L (ref 0–44)
AST: 22 U/L (ref 15–41)
Albumin: 3.5 g/dL (ref 3.5–5.0)
Alkaline Phosphatase: 45 U/L (ref 38–126)
Anion gap: 11 (ref 5–15)
BUN: 19 mg/dL (ref 8–23)
CO2: 21 mmol/L — ABNORMAL LOW (ref 22–32)
Calcium: 8.8 mg/dL — ABNORMAL LOW (ref 8.9–10.3)
Chloride: 96 mmol/L — ABNORMAL LOW (ref 98–111)
Creatinine, Ser: 1.03 mg/dL — ABNORMAL HIGH (ref 0.44–1.00)
GFR calc Af Amer: 58 mL/min — ABNORMAL LOW (ref >60)
GFR calc non Af Amer: 50 mL/min — ABNORMAL LOW (ref >60)
Glucose, Bld: 132 mg/dL — ABNORMAL HIGH (ref 70–99)
Potassium: 3.4 mmol/L — ABNORMAL LOW (ref 3.5–5.1)
Sodium: 128 mmol/L — ABNORMAL LOW (ref 135–145)
Total Bilirubin: 0.5 mg/dL (ref 0.3–1.2)
Total Protein: 6.2 g/dL — ABNORMAL LOW (ref 6.5–8.1)

## 2019-02-18 LAB — GLUCOSE, CAPILLARY
Glucose-Capillary: 123 mg/dL — ABNORMAL HIGH (ref 70–99)
Glucose-Capillary: 134 mg/dL — ABNORMAL HIGH (ref 70–99)
Glucose-Capillary: 250 mg/dL — ABNORMAL HIGH (ref 70–99)
Glucose-Capillary: 72 mg/dL (ref 70–99)
Glucose-Capillary: 126 mg/dL — ABNORMAL HIGH (ref 70–99)

## 2019-02-18 LAB — CBC
HCT: 30.7 % — ABNORMAL LOW (ref 36.0–46.0)
Hemoglobin: 10.7 g/dL — ABNORMAL LOW (ref 12.0–15.0)
MCH: 31.6 pg (ref 26.0–34.0)
MCHC: 34.9 g/dL (ref 30.0–36.0)
MCV: 90.6 fL (ref 80.0–100.0)
Platelets: 235 10*3/uL (ref 150–400)
RBC: 3.39 MIL/uL — ABNORMAL LOW (ref 3.87–5.11)
RDW: 11.7 % (ref 11.5–15.5)
WBC: 10.2 10*3/uL (ref 4.0–10.5)
nRBC: 0 % (ref 0.0–0.2)

## 2019-02-18 LAB — HEMOGLOBIN A1C
Hgb A1c MFr Bld: 6.4 % — ABNORMAL HIGH (ref 4.8–5.6)
Mean Plasma Glucose: 136.98 mg/dL

## 2019-02-18 LAB — LIPID PANEL
Cholesterol: 115 mg/dL (ref 0–200)
HDL: 35 mg/dL — ABNORMAL LOW (ref >40)
LDL Cholesterol: 50 mg/dL (ref 0–99)
Total CHOL/HDL Ratio: 3.3 RATIO
Triglycerides: 149 mg/dL (ref <150)
VLDL: 30 mg/dL (ref 0–40)

## 2019-02-18 LAB — SARS CORONAVIRUS 2 (TAT 6-24 HRS): SARS Coronavirus 2: NEGATIVE

## 2019-02-18 MED ORDER — ASPIRIN 325 MG PO TABS
325.0000 mg | ORAL_TABLET | Freq: Every day | ORAL | Status: DC
  Administered 2019-02-18 – 2019-02-20 (×3): 325 mg via ORAL
  Filled 2019-02-18 (×3): qty 1

## 2019-02-18 MED ORDER — HYDRALAZINE HCL 20 MG/ML IJ SOLN: 10.0000 mg | INTRAMUSCULAR | Status: DC | PRN

## 2019-02-18 MED ORDER — NITROGLYCERIN 0.4 MG SL SUBL: 0.4000 mg | SUBLINGUAL_TABLET | SUBLINGUAL | Status: DC | PRN

## 2019-02-18 MED ORDER — HYDRALAZINE HCL 50 MG PO TABS
50.0000 mg | ORAL_TABLET | Freq: Three times a day (TID) | ORAL | Status: DC
  Administered 2019-02-18 – 2019-02-20 (×7): 50 mg via ORAL
  Filled 2019-02-18 (×7): qty 1

## 2019-02-18 MED ORDER — INSULIN ASPART 100 UNIT/ML ~~LOC~~ SOLN
0.0000 [IU] | Freq: Three times a day (TID) | SUBCUTANEOUS | Status: DC
  Administered 2019-02-18: 1 [IU] via SUBCUTANEOUS
  Administered 2019-02-18 – 2019-02-19 (×2): 3 [IU] via SUBCUTANEOUS
  Administered 2019-02-19: 2 [IU] via SUBCUTANEOUS
  Administered 2019-02-20 (×2): 3 [IU] via SUBCUTANEOUS

## 2019-02-18 MED ORDER — ENOXAPARIN SODIUM 30 MG/0.3ML ~~LOC~~ SOLN
30.0000 mg | SUBCUTANEOUS | Status: DC
  Administered 2019-02-19 – 2019-02-20 (×2): 30 mg via SUBCUTANEOUS
  Filled 2019-02-18 (×2): qty 0.3

## 2019-02-18 MED ORDER — PAROXETINE HCL 20 MG PO TABS
40.0000 mg | ORAL_TABLET | Freq: Every day | ORAL | Status: DC
  Administered 2019-02-18 – 2019-02-19 (×3): 40 mg via ORAL
  Filled 2019-02-18 (×3): qty 2

## 2019-02-18 MED ORDER — STROKE: EARLY STAGES OF RECOVERY BOOK
Freq: Once | Status: AC
  Administered 2019-02-18: 03:00:00
  Filled 2019-02-18: qty 1

## 2019-02-18 MED ORDER — ACETAMINOPHEN 325 MG PO TABS
650.0000 mg | ORAL_TABLET | ORAL | Status: DC | PRN
  Administered 2019-02-19 – 2019-02-20 (×3): 650 mg via ORAL
  Filled 2019-02-18 (×3): qty 2

## 2019-02-18 MED ORDER — ATORVASTATIN CALCIUM 10 MG PO TABS
20.0000 mg | ORAL_TABLET | Freq: Every day | ORAL | Status: DC
  Administered 2019-02-18 – 2019-02-20 (×3): 20 mg via ORAL
  Filled 2019-02-18 (×3): qty 2

## 2019-02-18 MED ORDER — ACETAMINOPHEN 160 MG/5ML PO SOLN: 650.0000 mg | ORAL | Status: DC | PRN

## 2019-02-18 MED ORDER — METOPROLOL SUCCINATE ER 100 MG PO TB24
100.0000 mg | ORAL_TABLET | Freq: Every day | ORAL | Status: DC
  Administered 2019-02-18 – 2019-02-20 (×2): 100 mg via ORAL
  Filled 2019-02-18 (×3): qty 1

## 2019-02-18 MED ORDER — ACETAMINOPHEN 650 MG RE SUPP: 650.0000 mg | RECTAL | Status: DC | PRN

## 2019-02-18 MED ORDER — ENOXAPARIN SODIUM 40 MG/0.4ML ~~LOC~~ SOLN
40.0000 mg | SUBCUTANEOUS | Status: DC
  Administered 2019-02-18: 40 mg via SUBCUTANEOUS
  Filled 2019-02-18: qty 0.4

## 2019-02-18 MED ORDER — ASPIRIN 300 MG RE SUPP
300.0000 mg | Freq: Every day | RECTAL | Status: DC
  Filled 2019-02-18: qty 1

## 2019-02-18 MED ORDER — FAMOTIDINE 20 MG PO TABS: 40.0000 mg | ORAL_TABLET | Freq: Two times a day (BID) | ORAL | Status: DC | PRN

## 2019-02-18 NOTE — Progress Notes (Signed)
STROKE TEAM PROGRESS NOTE   INTERVAL HISTORY Husband at bedside. No events overnight. Patient stable, feeling well, answered all questions  Vitals:   02/18/19 0412 02/18/19 0618 02/18/19 0746 02/18/19 1200  BP: (!) 198/81 (!) 204/85 (!) 148/129 (!) 141/51  Pulse: 62 64 62 61  Resp: 18 18 18 18   Temp: (!) 97.4 F (36.3 C) 97.6 F (36.4 C) 97.6 F (36.4 C) 98.1 F (36.7 C)  TempSrc: Oral Oral Oral Axillary  SpO2: 100% 96% 98% 100%  Weight:      Height:        CBC:  Recent Labs  Lab 02/17/19 1535 02/17/19 1539 02/18/19 0420  WBC 10.5  --  10.2  NEUTROABS 6.6  --   --   HGB 11.8* 12.2 10.7*  HCT 34.9* 36.0 30.7*  MCV 92.1  --  90.6  PLT 249  --  973    Basic Metabolic Panel:  Recent Labs  Lab 02/15/19 1801  02/17/19 1535 02/17/19 1539 02/18/19 0420  NA  --    < > 130* 129* 128*  K  --    < > 3.9 3.9 3.4*  CL  --    < > 96* 96* 96*  CO2  --    < > 20*  --  21*  GLUCOSE  --    < > 141* 140* 132*  BUN  --    < > 21 21 19   CREATININE  --    < > 0.96 1.00 1.03*  CALCIUM  --    < > 9.3  --  8.8*  MG 1.6*  --   --   --   --    < > = values in this interval not displayed.   Lipid Panel:     Component Value Date/Time   CHOL 115 02/18/2019 0420   TRIG 149 02/18/2019 0420   HDL 35 (L) 02/18/2019 0420   CHOLHDL 3.3 02/18/2019 0420   VLDL 30 02/18/2019 0420   LDLCALC 50 02/18/2019 0420   HgbA1c:  Lab Results  Component Value Date   HGBA1C 6.4 (H) 02/17/2019   Urine Drug Screen:     Component Value Date/Time   LABOPIA NONE DETECTED 02/17/2019 1817   COCAINSCRNUR NONE DETECTED 02/17/2019 1817   LABBENZ POSITIVE (A) 02/17/2019 1817   AMPHETMU NONE DETECTED 02/17/2019 1817   THCU NONE DETECTED 02/17/2019 1817   LABBARB NONE DETECTED 02/17/2019 1817    Alcohol Level     Component Value Date/Time   ETH <10 02/17/2019 1535    IMAGING Ct Head Wo Contrast  Result Date: 02/17/2019 CLINICAL DATA:  Altered mental status following a fall today. EXAM: CT HEAD  WITHOUT CONTRAST CT CERVICAL SPINE WITHOUT CONTRAST TECHNIQUE: Multidetector CT imaging of the head and cervical spine was performed following the standard protocol without intravenous contrast. Multiplanar CT image reconstructions of the cervical spine were also generated. COMPARISON:  Head and cervical spine CT report dated 01/21/2018 and head CT dated 03/28/2017. FINDINGS: CT HEAD FINDINGS Brain: No significant change in mild-to-moderate enlargement of the ventricles and subarachnoid spaces. Moderate patchy white matter low density in both cerebral hemispheres with mild progression. No intracranial hemorrhage, mass lesion or CT evidence of acute infarction. Vascular: No hyperdense vessel or unexpected calcification. Skull: Normal. Negative for fracture or focal lesion. Sinuses/Orbits: Unremarkable. Other: None. CT CERVICAL SPINE FINDINGS Alignment: Mild dextroconvex cervicothoracic scoliosis. No subluxations. Skull base and vertebrae: No acute fracture. No primary bone lesion or focal pathologic process. Soft  tissues and spinal canal: No prevertebral fluid or swelling. No visible canal hematoma. Disc levels: Multilevel degenerative changes, most pronounced at the C5-6 and C6-7 levels. Upper chest: Minimal biapical pleural and parenchymal scarring. Other: 1.2 cm right lobe thyroid nodule an additional smaller poorly defined nodules. Dense bilateral carotid artery calcifications. IMPRESSION: 1. No skull fracture or intracranial hemorrhage. 2. No cervical spine fracture or subluxation. 3. Stable mild-to-moderate diffuse cerebral and cerebellar atrophy. 4. Moderate small vessel white matter ischemic changes in both cerebral hemispheres with mild progression. 5. Multilevel cervical spine degenerative changes. 6. Dense bilateral carotid artery atheromatous calcifications. 7. Multinodular thyroid gland with a 1.2 cm heterogeneous nodule on the right. Consider further evaluation with elective thyroid ultrasound. If  patient is clinically hyperthyroid, consider nuclear medicine thyroid uptake and scan. Electronically Signed   By: Claudie Revering M.D.   On: 02/17/2019 17:58   Ct Cervical Spine Wo Contrast  Result Date: 02/17/2019 CLINICAL DATA:  Altered mental status following a fall today. EXAM: CT HEAD WITHOUT CONTRAST CT CERVICAL SPINE WITHOUT CONTRAST TECHNIQUE: Multidetector CT imaging of the head and cervical spine was performed following the standard protocol without intravenous contrast. Multiplanar CT image reconstructions of the cervical spine were also generated. COMPARISON:  Head and cervical spine CT report dated 01/21/2018 and head CT dated 03/28/2017. FINDINGS: CT HEAD FINDINGS Brain: No significant change in mild-to-moderate enlargement of the ventricles and subarachnoid spaces. Moderate patchy white matter low density in both cerebral hemispheres with mild progression. No intracranial hemorrhage, mass lesion or CT evidence of acute infarction. Vascular: No hyperdense vessel or unexpected calcification. Skull: Normal. Negative for fracture or focal lesion. Sinuses/Orbits: Unremarkable. Other: None. CT CERVICAL SPINE FINDINGS Alignment: Mild dextroconvex cervicothoracic scoliosis. No subluxations. Skull base and vertebrae: No acute fracture. No primary bone lesion or focal pathologic process. Soft tissues and spinal canal: No prevertebral fluid or swelling. No visible canal hematoma. Disc levels: Multilevel degenerative changes, most pronounced at the C5-6 and C6-7 levels. Upper chest: Minimal biapical pleural and parenchymal scarring. Other: 1.2 cm right lobe thyroid nodule an additional smaller poorly defined nodules. Dense bilateral carotid artery calcifications. IMPRESSION: 1. No skull fracture or intracranial hemorrhage. 2. No cervical spine fracture or subluxation. 3. Stable mild-to-moderate diffuse cerebral and cerebellar atrophy. 4. Moderate small vessel white matter ischemic changes in both cerebral  hemispheres with mild progression. 5. Multilevel cervical spine degenerative changes. 6. Dense bilateral carotid artery atheromatous calcifications. 7. Multinodular thyroid gland with a 1.2 cm heterogeneous nodule on the right. Consider further evaluation with elective thyroid ultrasound. If patient is clinically hyperthyroid, consider nuclear medicine thyroid uptake and scan. Electronically Signed   By: Claudie Revering M.D.   On: 02/17/2019 17:58   Mr Brain Wo Contrast  Result Date: 02/17/2019 CLINICAL DATA:  TIA. Recent fall. Cardiac catheterization 02/16/2019 EXAM: MRI HEAD WITHOUT CONTRAST TECHNIQUE: Multiplanar, multiecho pulse sequences of the brain and surrounding structures were obtained without intravenous contrast. COMPARISON:  CT headache 11/2018 FINDINGS: Brain: Limited study. The patient was moving and not able to complete the study. Small focus of acute infarct in the left posterior temporal lobe measuring 5 mm. No other acute infarct Moderate atrophy. Chronic ischemic changes in the white matter, basal ganglia, and pons bilaterally. Negative for hemorrhage or fluid collection. Vascular: Normal arterial flow voids. IMPRESSION: Motion degraded and incomplete study 5 mm acute infarct left posterior temporal lobe Moderate atrophy and moderate chronic ischemic changes. Electronically Signed   By: Franchot Gallo M.D.   On: 02/17/2019  21:17   Vas US Carotid (at [place] Only)  Result Date: 02/18/2019 Carotid Arterial Duplex Study Indications:       CVA and acute infarct left posterior temporal lobe. Risk Factors:      Hypertension, hyperlipidemia, Diabetes. Comparison Study:  11/18/16 1-39%ICA Performing Technologist: June Leap RDMS, RVT  Examination Guidelines: A complete evaluation includes B-mode imaging, spectral Doppler, color Doppler, and power Doppler as needed of all accessible portions of each vessel. Bilateral testing is considered an integral part of a complete examination. Limited  examinations for reoccurring indications may be performed as noted.  Right Carotid Findings: +----------+-------+-------+--------+---------------------------------+--------+           PSV    EDV    StenosisDescribe                         Comments           cm/s   cm/s                                                     +----------+-------+-------+--------+---------------------------------+--------+ CCA Prox  49     9                                                        +----------+-------+-------+--------+---------------------------------+--------+ CCA Distal47     9              heterogenous                              +----------+-------+-------+--------+---------------------------------+--------+ ICA Prox  160    38     40-59%  heterogenous, calcific and                                                irregular                                 +----------+-------+-------+--------+---------------------------------+--------+ ICA Mid   154    19                                                       +----------+-------+-------+--------+---------------------------------+--------+ ICA Distal114    21                                                       +----------+-------+-------+--------+---------------------------------+--------+ ECA       136    3              heterogenous and irregular                +----------+-------+-------+--------+---------------------------------+--------+ +----------+--------+-------+----------------+-------------------+  PSV cm/sEDV cmsDescribe        Arm Pressure (mmHG) +----------+--------+-------+----------------+-------------------+ [id number]             Multiphasic, WNL                    +----------+--------+-------+----------------+-------------------+ +---------+--------+--+--------+--+---------+ VertebralPSV cm/s53EDV cm/s12Antegrade +---------+--------+--+--------+--+---------+  Left  Carotid Findings: +----------+--------+--------+--------+-------------------------+--------+           PSV cm/sEDV cm/sStenosisDescribe                 Comments +----------+--------+--------+--------+-------------------------+--------+ CCA Prox  75      11              heterogenous                      +----------+--------+--------+--------+-------------------------+--------+ CCA Distal55      12                                                +----------+--------+--------+--------+-------------------------+--------+ ICA Prox  79      14      1-39%   heterogenous and calcific         +----------+--------+--------+--------+-------------------------+--------+ ICA Mid   83      15                                                +----------+--------+--------+--------+-------------------------+--------+ ICA Distal73      10                                                +----------+--------+--------+--------+-------------------------+--------+ ECA       117     4                                                 +----------+--------+--------+--------+-------------------------+--------+ +----------+--------+--------+----------------+-------------------+ SubclavianPSV cm/sEDV cm/sDescribe        Arm Pressure (mmHG) +----------+--------+--------+----------------+-------------------+           96              Multiphasic, WNL                    +----------+--------+--------+----------------+-------------------+ +---------+--------+--+--------+--+---------+ VertebralPSV cm/s93EDV cm/s18Antegrade +---------+--------+--+--------+--+---------+  Summary: Right Carotid: Velocities in the right ICA are consistent with a 40-59%                stenosis. Left Carotid: Velocities in the left ICA are consistent with a 1-39% stenosis.  *See table(s) above for measurements and observations.     Preliminary     PHYSICAL EXAM  Exam: NAD, pleasant                   Speech:    Speech is normal; fluent and spontaneous with normal comprehension.  Cognition:    The patient is oriented to person, place, and time;     recent and remote memory intact;     language fluent;    Cranial Nerves:  The pupils are equal, round, and reactive to light.Trigeminal sensation is intact and the muscles of mastication are normal. The face is symmetric. The palate elevates in the midline. Hearing intact. Voice is normal. Shoulder shrug is normal. The tongue has normal motion without fasciculations.   Coordination:  No dysmetria  Motor Observation:    No asymmetry, no atrophy, and no involuntary movements noted. Tone:    Normal muscle tone.     Strength:    Strength is V/V in the upper and lower limbs.      Sensation: intact to LT   ASSESSMENT/PLAN Stephanie [name] is a 85 y.o. female with history of HTN, DB, CAD s/p stent, chronic anemia, recent cardiac cath w/ admission fosr SVT presenting following a fall with confusion. Na 130 on admission.   Stroke:  Small L posterior temporal lobe  infarct embolic likely incidental secondary to procedure cardiac cath and not related to presenting symptoms  CT head no hmg. Small vessel disease. Atrophy.   CT CS no fx, multilevel degenerative changes.dense ICA calcifications. Multinodular thryoid w/ 1.2cm R sided nodule  MRI  Acute L posterior temporal lobe infarct. Small vessel disease. Atrophy.   Carotid Doppler  R ICA 40-59%, L ok  2D Echo EF 45-50%. No source of embolus, severe HK, cannot rule out shunt  LDL 50  HgbA1c 6.4  Lovenox 40 mg sq daily for VTE prophylaxis  dipyridamole SR 250 mg/aspirin 25 mg twice a day prior to admission, now on aspirin 325 mg daily. Intolerant to plavix (GI upset). Can restart home antiplatelet regimen on discharge.  Therapy recommendations:  HH PT  Disposition:  Return home  Hypertension  Stable . Permissive hypertension (OK if < 220/120) but gradually normalize in 5-7  days . Long-term BP goal normotensive  Hyperlipidemia  Home meds:  lipitor 20 , resumed in hospital  LDL 50, goal < 70  Continue statin at discharge  Diabetes type II Controlled  HgbA1c 6.4, goal < 7.0  Other Stroke Risk Factors  Advanced age  Hx stroke/TIA  PVD  CAD s/p recent card cath Unremarkable   Hx SVT requiring recent hospitalization on toprol  Other Active Problems  Chronic anemia  Hospital day # 0  Small L posterior temporal lobe  infarct embolic likely incidental secondary to procedure cardiac cath and not related to presenting symptoms. Can resume home meds/hoe antiplatelet therapy. Stroke will sign off.   Personally examined patient and images, and have participated in and made any corrections needed to history, physical, neuro exam,assessment and plan as stated above.  I have personally obtained the history, evaluated lab date, reviewed imaging studies and agree with radiology interpretations.    Sarina Ill, MD Stroke Neurology   A total of 35 minutes was spent for the care of this patient, spent on counseling patient and family on different diagnostic and therapeutic options, counseling and coordination of care, riskd ans benefits of management, compliance, or risk factor reduction and education.   To contact Stroke Continuity provider, please refer to [email or web address] After hours, contact General Neurology

## 2019-02-18 NOTE — Evaluation (Signed)
Physical Therapy Evaluation Patient Details Name: Stephanie [name] MRN: [id number] DOB: Dec 15, 1933 Today's Date: 02/18/2019   History of Present Illness  Stephanie [name] is a 85 y.o. female with past medical history of hypertension, diabetes mellitus, coronary artery disease status post stent, chronic anemia  with the recent cardiac catheterization and admission for SVT presents to the ED after husband found patient confused after a fall. MRI shows L temporal infarct.  Clinical Impression  Pt admitted with above diagnosis. Pt currently with functional limitations due to the deficits listed below (see PT Problem List).  Pt will benefit from skilled PT to increase their independence and safety with mobility to allow discharge to the venue listed below.  Pt limited today by nausea and vomiting.  Recommend HHPT.     Follow Up Recommendations Home health PT    Equipment Recommendations  None recommended by PT    Recommendations for Other Services       Precautions / Restrictions Restrictions Weight Bearing Restrictions: No      Mobility  Bed Mobility Overal bed mobility: Needs Assistance Bed Mobility: Supine to Sit     Supine to sit: Min assist     General bed mobility comments: MIN A to get trunk upright  Transfers Overall transfer level: Needs assistance Equipment used: Rolling walker (2 wheeled) Transfers: Sit to/from Stand Sit to Stand: Min guard         General transfer comment: min/guard to stand multiple reps with cues for hand placement.  Ambulation/Gait Ambulation/Gait assistance: Min assist Gait Distance (Feet): 2 Feet Assistive device: Rolling walker (2 wheeled) Gait Pattern/deviations: Decreased step length - right;Decreased step length - left     General Gait Details: Pt ambulated a few feet and then became nauseous and pale and had to sit, so transferred to the recliner.  She proceeded to vomit and deferred further gait.  Stairs             Wheelchair Mobility    Modified Rankin (Stroke Patients Only) Modified Rankin (Stroke Patients Only) Pre-Morbid Rankin Score: Slight disability Modified Rankin: Moderately severe disability     Balance Overall balance assessment: Needs assistance;History of Falls   Sitting balance-Leahy Scale: Fair       Standing balance-Leahy Scale: Poor                               Pertinent Vitals/Pain Pain Assessment: No/denies pain    Home Living Family/patient expects to be discharged to:: Private residence Living Arrangements: Spouse/significant other Available Help at Discharge: Family;Available 24 hours/day Type of Home: House Home Access: Stairs to enter Entrance Stairs-Rails: (post) Entrance Stairs-Number of Steps: 2 small steps(has 1 big step and said son put something on it to make 2 smaller steps with a post) Home Layout: One level Home Equipment: Walker - 2 wheels;Walker - 4 wheels;Cane - quad;Cane - single point;Bedside commode      Prior Function Level of Independence: Independent with assistive device(s)         Comments: amb with RW or quad cane     Hand Dominance        Extremity/Trunk Assessment   Upper Extremity Assessment Upper Extremity Assessment: Defer to OT evaluation    Lower Extremity Assessment Lower Extremity Assessment: Generalized weakness       Communication      Cognition Arousal/Alertness: Awake/alert Behavior During Therapy: WFL for tasks assessed/performed;Flat affect Overall Cognitive Status: Within Functional  Limits for tasks assessed                                 General Comments: She knew the year and the month, but not the date or day of the week.      General Comments      Exercises     Assessment/Plan    PT Assessment Patient needs continued PT services  PT Problem List Decreased strength;Decreased activity tolerance;Decreased balance;Decreased mobility;Decreased knowledge of use  of DME       PT Treatment Interventions DME instruction;Gait training;Functional mobility training;Stair training;Therapeutic activities;Therapeutic exercise;Balance training;Neuromuscular re-education    PT Goals (Current goals can be found in the Care Plan section)  Acute Rehab PT Goals Patient Stated Goal: Pt agreeable to PT PT Goal Formulation: With patient Time For Goal Achievement: 03/04/19 Potential to Achieve Goals: Good    Frequency Min 4X/week   Barriers to discharge        Co-evaluation PT/OT/SLP Co-Evaluation/Treatment: Yes Reason for Co-Treatment: For patient/therapist safety;To address functional/ADL transfers PT goals addressed during session: Mobility/safety with mobility         AM-PAC PT "6 Clicks" Mobility  Outcome Measure Help needed turning from your back to your side while in a flat bed without using bedrails?: A Little Help needed moving from lying on your back to sitting on the side of a flat bed without using bedrails?: A Little Help needed moving to and from a bed to a chair (including a wheelchair)?: A Little Help needed standing up from a chair using your arms (e.g., wheelchair or bedside chair)?: A Little Help needed to walk in hospital room?: A Little Help needed climbing 3-5 steps with a railing? : A Little 6 Click Score: 18    End of Session Equipment Utilized During Treatment: Gait belt Activity Tolerance: Treatment limited secondary to medical complications (Comment)(Nausea and vomiting) Patient left: in chair;with call bell/phone within reach;with chair alarm set Nurse Communication: Mobility status(vomiting) PT Visit Diagnosis: Unsteadiness on feet (R26.81);Muscle weakness (generalized) (M62.81);Difficulty in walking, not elsewhere classified (R26.2)    Time: 7867-5449 PT Time Calculation (min) (ACUTE ONLY): 30 min   Charges:   PT Evaluation $PT Eval Moderate Complexity: 1 Mod          Stephanie [name], [place] Pager  [phone] 02/18/2019   Stephanie [name] 02/18/2019, 10:29 AM

## 2019-02-18 NOTE — Consult Note (Signed)
Requesting Physician: Dr. Vonna Kotyk Long/Dr. Reeves Forth     Chief Complaint: Confusion, left facial droop   History obtained from: Patient and Chart    HPI:                                                                                                                                       Stephanie [name] is a 85 y.o. female with past medical history of hypertension, diabetes mellitus, coronary artery disease status post stent, chronic anemia  with the recent cardiac catheterization and admission for SVT presents to the ED after husband found patient confused after a fall yesterday afternoon at 12:45 PM.  According to the patient, she felt fine after her discharge and tripped on something that led to her fall.  Patient recalls the event and denies feeling confused.  However, her husband ( per chart review) noted patient was confused, could not state name of the president and called EMS.  EMS felt patient had a mild left facial droop and brought her to the emergency room.  Work-up in the ED included CT head which was unremarkable and MRI of the brain which showed a small infarct in the left parietotemporal lobe.  She was also noted to be hyponatremic(mildly) with sodium of 130.  Patient was admitted to medicine for further evaluation.  Date last known well: 8.5.20 Time last known well:  12.45 pm tPA Given: no, outside window  NIHSS: 0 Baseline MRS 0   Past Medical History:  Diagnosis Date  . Allergy    allergic rhinitis  . Anxiety   . Arthritis    osteoarthritis  . Degenerative disk disease    in neck  . Diabetes mellitus    type II  . Fracture of femoral neck, left ([place]) 01/21/2018  . Hyperlipidemia   . Hypertension   . Hypoaldosteronism ([place]) 6/12   from DM causing hyperkalemia   . Osteopenia   . Periodontal disease   . Peripheral vascular disease ([place])   . Transient cerebral ischemia 11/06/2016    Past Surgical History:  Procedure Laterality Date  . CARDIAC CATHETERIZATION      x2 STENT  . CHOLECYSTECTOMY    . ESOPHAGOGASTRODUODENOSCOPY  03/2004   gastritis by biopsy  . GUM SURGERY  06/2010   laser surgery for gums  . HIP ARTHROPLASTY Left 01/21/2018   Procedure: ARTHROPLASTY BIPOLAR HIP (HEMIARTHROPLASTY);  Surgeon: Marchia Bond, MD;  Location: [facility];  Service: Orthopedics;  Laterality: Left;  . IR RADIOLOGIST EVAL & MGMT  06/17/2018  . LEFT HEART CATH AND CORONARY ANGIOGRAPHY N/A 02/16/2019   Procedure: LEFT HEART CATH AND CORONARY ANGIOGRAPHY;  Surgeon: Wellington Hampshire, MD;  Location: [facility];  Service: Cardiovascular;  Laterality: N/A;  . MM BREAST STEREO BX*L*R/S  12/12   normal  . TONSILLECTOMY    . TRANSTHORACIC ECHOCARDIOGRAM  11/2016   EF 55-60%, grade 1  DD with elevated LV end diastolic filling pressures, mildly dilated LA, mild TR and PR.   . TUBAL LIGATION      Family History  Problem Relation Age of Onset  . Cancer Mother        vaginal CA in situ  . Hypertension Mother   . Heart disease Mother        CAD and MI  . Heart disease Father        CAD  . Diabetes Father   . Heart disease Son        congenital valve problem   Social History:  reports that she has never smoked. She has never used smokeless tobacco. She reports that she does not drink alcohol or use drugs.  Allergies:  Allergies  Allergen Reactions  . Ace Inhibitors Other (See Comments)    Chest pain  . Amlodipine Besy-Benazepril Hcl Other (See Comments)    Chest pain  . Clopidogrel Bisulfate Other (See Comments)    GI upset  . Erythromycin Other (See Comments)    GI upset  . Tape Other (See Comments)    Causes soreness (of the skin)    Medications:                                                                                                                        I reviewed home medications   ROS:                                                                                                                                     14 systems reviewed  and negative except above   Examination:                                                                                                     General: Appears well-developed  Psych: Affect appropriate to situation Eyes: No scleral injection HENT: No OP obstrucion Head: Normocephalic.  Cardiovascular: Normal rate and regular rhythm.  Respiratory: Effort  normal and breath sounds normal to anterior ascultation GI: Soft.  No distension. There is no tenderness.  Skin: WDI    Neurological Examination Mental Status: Alert, oriented, thought content appropriate.  Speech fluent without evidence of aphasia. Able to follow 3 step commands without difficulty. Cranial Nerves: II: Visual fields grossly normal,  III,IV, VI: ptosis not present, extra-ocular motions intact bilaterally, pupils equal, round, reactive to light and accommodation V,VII: smile symmetric, facial light touch sensation normal bilaterally VIII: hearing normal bilaterally IX,X: uvula rises symmetrically XI: bilateral shoulder shrug XII: midline tongue extension Motor: Right : Upper extremity   5/5    Left:     Upper extremity   5/5  Lower extremity   5/5     Lower extremity   5/5 Tone and bulk:normal tone throughout; no atrophy noted Sensory: Pinprick and light touch intact throughout, bilaterally Deep Tendon Reflexes: 2+ and symmetric throughout Plantars: Right: downgoing   Left: downgoing Cerebellar: normal finger-to-nose, normal rapid alternating movements and normal heel-to-shin test Gait: normal gait and station     Lab Results: Basic Metabolic Panel: Recent Labs  Lab 02/15/19 1601 02/15/19 1801 02/16/19 0219 02/17/19 0911 02/17/19 1535 02/17/19 1539  NA 137  --  134* 129* 130* 129*  K 4.2  --  4.1 4.2 3.9 3.9  CL 101  --  100 96* 96* 96*  CO2 25  --  22 19* 20*  --   GLUCOSE 82  --  106* 181* 141* 140*  BUN 21  --  20 20 21 21   CREATININE 1.04*  --  0.99 1.11* 0.96 1.00  CALCIUM 9.7  --  9.4 9.0 9.3   --   MG  --  1.6*  --   --   --   --     CBC: Recent Labs  Lab 02/15/19 1601 02/16/19 0219 02/17/19 0236 02/17/19 1535 02/17/19 1539  WBC 7.9 10.3 10.8* 10.5  --   NEUTROABS  --   --   --  6.6  --   HGB 11.0* 11.1* 10.9* 11.8* 12.2  HCT 32.9* 31.9* 32.3* 34.9* 36.0  MCV 94.0 91.4 92.0 92.1  --   PLT 224 226 246 249  --     Coagulation Studies: Recent Labs    02/17/19 1535  LABPROT 14.4  INR 1.1    Imaging: Ct Head Wo Contrast  Result Date: 02/17/2019 CLINICAL DATA:  Altered mental status following a fall today. EXAM: CT HEAD WITHOUT CONTRAST CT CERVICAL SPINE WITHOUT CONTRAST TECHNIQUE: Multidetector CT imaging of the head and cervical spine was performed following the standard protocol without intravenous contrast. Multiplanar CT image reconstructions of the cervical spine were also generated. COMPARISON:  Head and cervical spine CT report dated 01/21/2018 and head CT dated 03/28/2017. FINDINGS: CT HEAD FINDINGS Brain: No significant change in mild-to-moderate enlargement of the ventricles and subarachnoid spaces. Moderate patchy white matter low density in both cerebral hemispheres with mild progression. No intracranial hemorrhage, mass lesion or CT evidence of acute infarction. Vascular: No hyperdense vessel or unexpected calcification. Skull: Normal. Negative for fracture or focal lesion. Sinuses/Orbits: Unremarkable. Other: None. CT CERVICAL SPINE FINDINGS Alignment: Mild dextroconvex cervicothoracic scoliosis. No subluxations. Skull base and vertebrae: No acute fracture. No primary bone lesion or focal pathologic process. Soft tissues and spinal canal: No prevertebral fluid or swelling. No visible canal hematoma. Disc levels: Multilevel degenerative changes, most pronounced at the C5-6 and C6-7 levels. Upper chest: Minimal biapical pleural and parenchymal scarring. Other: 1.2 cm  right lobe thyroid nodule an additional smaller poorly defined nodules. Dense bilateral carotid artery  calcifications. IMPRESSION: 1. No skull fracture or intracranial hemorrhage. 2. No cervical spine fracture or subluxation. 3. Stable mild-to-moderate diffuse cerebral and cerebellar atrophy. 4. Moderate small vessel white matter ischemic changes in both cerebral hemispheres with mild progression. 5. Multilevel cervical spine degenerative changes. 6. Dense bilateral carotid artery atheromatous calcifications. 7. Multinodular thyroid gland with a 1.2 cm heterogeneous nodule on the right. Consider further evaluation with elective thyroid ultrasound. If patient is clinically hyperthyroid, consider nuclear medicine thyroid uptake and scan. Electronically Signed   By: Claudie Revering M.D.   On: 02/17/2019 17:58   Ct Cervical Spine Wo Contrast  Result Date: 02/17/2019 CLINICAL DATA:  Altered mental status following a fall today. EXAM: CT HEAD WITHOUT CONTRAST CT CERVICAL SPINE WITHOUT CONTRAST TECHNIQUE: Multidetector CT imaging of the head and cervical spine was performed following the standard protocol without intravenous contrast. Multiplanar CT image reconstructions of the cervical spine were also generated. COMPARISON:  Head and cervical spine CT report dated 01/21/2018 and head CT dated 03/28/2017. FINDINGS: CT HEAD FINDINGS Brain: No significant change in mild-to-moderate enlargement of the ventricles and subarachnoid spaces. Moderate patchy white matter low density in both cerebral hemispheres with mild progression. No intracranial hemorrhage, mass lesion or CT evidence of acute infarction. Vascular: No hyperdense vessel or unexpected calcification. Skull: Normal. Negative for fracture or focal lesion. Sinuses/Orbits: Unremarkable. Other: None. CT CERVICAL SPINE FINDINGS Alignment: Mild dextroconvex cervicothoracic scoliosis. No subluxations. Skull base and vertebrae: No acute fracture. No primary bone lesion or focal pathologic process. Soft tissues and spinal canal: No prevertebral fluid or swelling. No visible  canal hematoma. Disc levels: Multilevel degenerative changes, most pronounced at the C5-6 and C6-7 levels. Upper chest: Minimal biapical pleural and parenchymal scarring. Other: 1.2 cm right lobe thyroid nodule an additional smaller poorly defined nodules. Dense bilateral carotid artery calcifications. IMPRESSION: 1. No skull fracture or intracranial hemorrhage. 2. No cervical spine fracture or subluxation. 3. Stable mild-to-moderate diffuse cerebral and cerebellar atrophy. 4. Moderate small vessel white matter ischemic changes in both cerebral hemispheres with mild progression. 5. Multilevel cervical spine degenerative changes. 6. Dense bilateral carotid artery atheromatous calcifications. 7. Multinodular thyroid gland with a 1.2 cm heterogeneous nodule on the right. Consider further evaluation with elective thyroid ultrasound. If patient is clinically hyperthyroid, consider nuclear medicine thyroid uptake and scan. Electronically Signed   By: Claudie Revering M.D.   On: 02/17/2019 17:58   Mr Brain Wo Contrast  Result Date: 02/17/2019 CLINICAL DATA:  TIA. Recent fall. Cardiac catheterization 02/16/2019 EXAM: MRI HEAD WITHOUT CONTRAST TECHNIQUE: Multiplanar, multiecho pulse sequences of the brain and surrounding structures were obtained without intravenous contrast. COMPARISON:  CT headache 11/2018 FINDINGS: Brain: Limited study. The patient was moving and not able to complete the study. Small focus of acute infarct in the left posterior temporal lobe measuring 5 mm. No other acute infarct Moderate atrophy. Chronic ischemic changes in the white matter, basal ganglia, and pons bilaterally. Negative for hemorrhage or fluid collection. Vascular: Normal arterial flow voids. IMPRESSION: Motion degraded and incomplete study 5 mm acute infarct left posterior temporal lobe Moderate atrophy and moderate chronic ischemic changes. Electronically Signed   By: Franchot Gallo M.D.   On: 02/17/2019 21:26     ASSESSMENT AND PLAN   85 y.o. female with past medical history of hypertension, diabetes mellitus, coronary artery disease status post stent, chronic anemia  with the recent cardiac catheterization and admission  for SVT presents to the ED after husband found patient confused after a fall yesterday afternoon at 12:45 PM.  MRI brain was performed which showed a small infarct in the left temporoparietal region. Based on the history, the fall appears to have been mechanical.  I think findings on MRI may be incidental and reflective of an embolic event that may have occurred during cardiac catheterization. This infarct also does not explain a left facial droop.    Small Acute Infarct in the Left Temporoparietal Region Etiology: Secondary to cardiac catheterization, likely incidental versus cardio embolic stroke  Recommendations Carotid ultrasound Continue aspirin and statin Check A1c and lipid profile Control blood pressure, BP goal less than 185/1 10 mmHg for the first 24 hours and then gradual control to normotension I do not feel an echocardiogram needs to be repeated 30 day Holter monitor  On discharge to evaluate for paroxysmal A. Fib    Sushanth Aroor [employer]support[employer]administrative officer Number [phone]

## 2019-02-18 NOTE — Evaluation (Signed)
Speech Language Pathology Evaluation Patient Details Name: Stephanie [name] MRN: [id number] DOB: 19-Oct-1933 Today's Date: 02/18/2019 Time: 5885-0277 SLP Time Calculation (min) (ACUTE ONLY): 26 min  Problem List:  Patient Active Problem List   Diagnosis Date Noted  . Demand ischemia ([place]) 02/17/2019  . Chest pain 02/17/2019  . Elevated troponin 02/17/2019  . Acute CVA (cerebrovascular accident) ([place]) 02/17/2019  . Non-ST elevation (NSTEMI) myocardial infarction ([place]) 02/16/2019  . NSTEMI (non-ST elevated myocardial infarction) ([place])   . SVT (supraventricular tachycardia) ([place]) 02/15/2019  . Colonic diverticular abscess 06/05/2018  . Anemia 06/05/2018  . Protein calorie malnutrition ([place]) 06/05/2018  . Fecal occult blood test positive 03/12/2018  . Closed fracture of left hip ([place])   . Chronic kidney disease (CKD), stage II (mild) 01/22/2018  . Fracture of femoral neck, left ([place]) 01/21/2018  . Depression 01/21/2018  . HLD (hyperlipidemia) 01/21/2018  . GERD (gastroesophageal reflux disease) 01/21/2018  . CAD (coronary artery disease) 01/21/2018  . Subcapital fracture of left hip ([place]) 01/21/2018  . Carotid stenosis, bilateral 11/20/2016  . Transient cerebral ischemia 11/06/2016  . Vitamin D deficiency 09/04/2015  . Routine general medical examination at a health care facility 09/04/2015  . Encounter for Medicare annual wellness exam 05/30/2014  . Colon cancer screening 05/30/2014  . History of fall 05/26/2013  . Gynecological examination 02/20/2011  . HELICOBACTER PYLORI INFECTION 12/19/2006  . HSV 12/19/2006  . Type 2 diabetes mellitus ([place]) 12/19/2006  . Diabetic retinopathy ([place]) 12/19/2006  . Combined hyperlipidemia associated with type 2 diabetes mellitus ([place]) 12/19/2006  . ANXIETY 12/19/2006  . HTN (hypertension) 12/19/2006  . Coronary atherosclerosis 12/19/2006  . Peripheral vascular disease due to secondary diabetes mellitus ([place]) 12/19/2006  . ALLERGIC RHINITIS  12/19/2006  . OSTEOARTHRITIS 12/19/2006  . Osteopenia 12/19/2006   Past Medical History:  Past Medical History:  Diagnosis Date  . Allergy    allergic rhinitis  . Anxiety   . Arthritis    osteoarthritis  . Degenerative disk disease    in neck  . Diabetes mellitus    type II  . Fracture of femoral neck, left ([place]) 01/21/2018  . Hyperlipidemia   . Hypertension   . Hypoaldosteronism ([place]) 6/12   from DM causing hyperkalemia   . Osteopenia   . Periodontal disease   . Peripheral vascular disease ([place])   . Transient cerebral ischemia 11/06/2016   Past Surgical History:  Past Surgical History:  Procedure Laterality Date  . CARDIAC CATHETERIZATION     x2 STENT  . CHOLECYSTECTOMY    . ESOPHAGOGASTRODUODENOSCOPY  03/2004   gastritis by biopsy  . GUM SURGERY  06/2010   laser surgery for gums  . HIP ARTHROPLASTY Left 01/21/2018   Procedure: ARTHROPLASTY BIPOLAR HIP (HEMIARTHROPLASTY);  Surgeon: Marchia Bond, MD;  Location: [facility];  Service: Orthopedics;  Laterality: Left;  . IR RADIOLOGIST EVAL & MGMT  06/17/2018  . LEFT HEART CATH AND CORONARY ANGIOGRAPHY N/A 02/16/2019   Procedure: LEFT HEART CATH AND CORONARY ANGIOGRAPHY;  Surgeon: Wellington Hampshire, MD;  Location: [facility];  Service: Cardiovascular;  Laterality: N/A;  . MM BREAST STEREO BX*L*R/S  12/12   normal  . TONSILLECTOMY    . TRANSTHORACIC ECHOCARDIOGRAM  11/2016   EF 55-60%, grade 1 DD with elevated LV end diastolic filling pressures, mildly dilated LA, mild TR and PR.   . TUBAL LIGATION     HPI:  Pt is an 85 y.o. female with history of diabetes mellitus, CAD status post stenting,  hypertension, chronic anemia who was discharged on 02/17/19 after cardiac cath and was found to be confused by her husband. MRI of the brain revealed 5 mm acute infarct left posterior temporal lobe.    Assessment / Plan / Recommendation Clinical Impression  Pt participated in speech/language evaluation with her husband present. Both  parties denied any baseline deficits in speech, language or cognition. [name] stated that the pt's processing speed was slow yesterday but indicated that her cognition appears to be back to baseline. Her speech and language skills are currently within normal limits and no overt cognitive deficits were noted. Pt was not oriented to date or day but the pt's husband indicated that this is normal for both of them since they are both retired. Further skilled SLP services are not clinically indicated at this time. Pt, nursing, and her husband were educated regarding results and recommendations and all parties verbalized understanding as well as agreement with plan of care.    SLP Assessment  SLP Recommendation/Assessment: Patient does not need any further Speech Lanaguage Pathology Services SLP Visit Diagnosis: Aphasia (R47.01)    Follow Up Recommendations  None    Frequency and Duration           SLP Evaluation Cognition  Overall Cognitive Status: Within Functional Limits for tasks assessed Arousal/Alertness: Awake/alert Orientation Level: Oriented to person;Oriented to place;Oriented to situation;Disoriented to time(Oriented to month, and year not date or day. ) Attention: Focused;Sustained Focused Attention: Appears intact Sustained Attention: Appears intact Memory: Impaired Memory Impairment: Storage deficit;Retrieval deficit;Decreased recall of new information(Immediate: 3/3; delayed: 1/3; with cues: 2/2) Awareness: Appears intact Problem Solving: Appears intact       Comprehension  Auditory Comprehension Overall Auditory Comprehension: Appears within functional limits for tasks assessed Yes/No Questions: Within Functional Limits Basic Biographical Questions: (5/5) Complex Questions: (5/5) Paragraph Comprehension (via yes/no questions): (3/4) Commands: Impaired Two Step Basic Commands: (4/4) Multistep Basic Commands: (3/4 with additional processing time) Reading  Comprehension Reading Status: Within funtional limits    Expression Expression Primary Mode of Expression: Verbal Verbal Expression Overall Verbal Expression: Appears within functional limits for tasks assessed Initiation: No impairment Automatic Speech: Counting;Day of week;Month of year(WNL) Level of Generative/Spontaneous Verbalization: Conversation Repetition: No impairment(5/5) Naming: No impairment Pragmatics: No impairment   Oral / Motor  Oral Motor/Sensory Function Overall Oral Motor/Sensory Function: Within functional limits Motor Speech Overall Motor Speech: Appears within functional limits for tasks assessed Respiration: Within functional limits Phonation: Normal Resonance: Within functional limits Articulation: Within functional limitis Intelligibility: Intelligible Motor Planning: Witnin functional limits Motor Speech Errors: Not applicable   Kiana Hollar I. Hardin Negus, [place], [place] Office number [phone] Pager [place] 02/18/2019, 11:41 AM

## 2019-02-18 NOTE — Evaluation (Signed)
Occupational Therapy Evaluation Patient Details Name: Stephanie [name] MRN: [id number] DOB: [birth year] Today's Date: 02/18/2019    History of Present Illness Stephanie [name] is a 85 y.o. female with past medical history of hypertension, diabetes mellitus, coronary artery disease status post stent, chronic anemia  with the recent cardiac catheterization and admission for SVT presents to the ED after husband found patient confused after a fall. MRI shows L temporal infarct.   Clinical Impression   Pt PTA: living with family and independent with ADL and mobility. Pt currently performing ADL functional mobility with RW a short distance before feeling sick. Pt ADL tasks with set-upA to minA overall as eval limited as pt was vomiting. Pt performing self feeding task with set-upA. Pt limited by generalized weakness. Pt would benefit from continued OT skilled services for ADL, mobility and safety in [place] setting. OT following acutely.      Follow Up Recommendations  Home health OT    Equipment Recommendations  None recommended by OT    Recommendations for Other Services       Precautions / Restrictions Precautions Precautions: Fall Restrictions Weight Bearing Restrictions: No      Mobility Bed Mobility Overal bed mobility: Needs Assistance Bed Mobility: Supine to Sit     Supine to sit: Min assist     General bed mobility comments: MIN A to get trunk upright  Transfers Overall transfer level: Needs assistance Equipment used: Rolling walker (2 wheeled) Transfers: Sit to/from Stand Sit to Stand: Min guard         General transfer comment: min/guard to stand multiple reps with cues for hand placement.    Balance Overall balance assessment: Needs assistance;History of Falls Sitting-balance support: Single extremity supported;Feet supported Sitting balance-Leahy Scale: Fair       Standing balance-Leahy Scale: Poor Standing balance comment: w/ RW                            ADL either performed or assessed with clinical judgement   ADL Overall ADL's : Needs assistance/impaired Eating/Feeding: Supervision/ safety;Sitting   Grooming: Wash/dry hands;Wash/dry face;Minimal assistance;Standing   Upper Body Bathing: Supervision/ safety;Set up;Sitting   Lower Body Bathing: Minimal assistance;Sitting/lateral leans;Sit to/from stand   Upper Body Dressing : Supervision/safety;Sitting;Standing   Lower Body Dressing: Minimal assistance;Sitting/lateral leans;Sit to/from stand   Toilet Transfer: Min guard;RW;Cueing for Designer, television/film set and Hygiene: Minimal assistance;Sitting/lateral lean;Sit to/from stand;Cueing for safety;Cueing for sequencing       Functional mobility during ADLs: Min guard;Rolling walker General ADL Comments: Pt not feeling well today- went pale white and vomitted so further gait deferred. pt ambulated 3' from bed to recliner with RW w/minguardA. Pt states that her spouse is very suppoprtive at home.     Vision Baseline Vision/History: No visual deficits Vision Assessment?: No apparent visual deficits     Perception     Praxis      Pertinent Vitals/Pain Pain Assessment: No/denies pain     Hand Dominance Right   Extremity/Trunk Assessment Upper Extremity Assessment Upper Extremity Assessment: Generalized weakness;RUE deficits/detail;LUE deficits/detail RUE Deficits / Details: 3-/5 MM grade  RUE Coordination: decreased fine motor LUE Deficits / Details: 3-/5 MM grade LUE Coordination: decreased fine motor   Lower Extremity Assessment Lower Extremity Assessment: Generalized weakness   Cervical / Trunk Assessment Cervical / Trunk Assessment: Normal   Communication Communication Communication: No difficulties   Cognition Arousal/Alertness: Awake/alert Behavior During Therapy: WFL for  tasks assessed/performed;Flat affect Overall Cognitive Status: Within Functional Limits for tasks assessed                                  General Comments: She knew the year and the month, but not the date or day of the week.   General Comments       Exercises     Shoulder Instructions      Home Living Family/patient expects to be discharged to:: Private residence Living Arrangements: Spouse/significant other Available Help at Discharge: Family;Available 24 hours/day Type of Home: House Home Access: Stairs to enter [employer] of Steps: 2 small steps Entrance Stairs-Rails: None Home Layout: One level     Bathroom Shower/Tub: Occupational psychologist: Standard            Lives With: Spouse    Prior Functioning/Environment Level of Independence: Independent with assistive device(s)        Comments: amb with RW or quad cane        OT Problem List: Decreased strength;Decreased activity tolerance;Impaired balance (sitting and/or standing);Decreased safety awareness;Decreased coordination      OT Treatment/Interventions: Self-care/ADL training;Therapeutic exercise;Neuromuscular education;Energy conservation;DME and/or AE instruction;Therapeutic activities;Patient/family education;Balance training    OT Goals(Current goals can be found in the care plan section) Acute Rehab OT Goals Patient Stated Goal: to feel better OT Goal Formulation: With patient Time For Goal Achievement: 03/04/19 Potential to Achieve Goals: Good ADL Goals Pt Will Perform Grooming: with modified independence;sitting Pt Will Perform Lower Body Dressing: with supervision;sit to/from stand Pt Will Perform Toileting - Clothing Manipulation and hygiene: with supervision;sit to/from stand;sitting/lateral leans Pt/caregiver will Perform Home Exercise Program: Increased strength;With written HEP provided Additional ADL Goal #1: Pt will increase to supervisionA for OOB ADL  OT Frequency: Min 2X/week   Barriers to D/C:            Co-evaluation PT/OT/SLP  Co-Evaluation/Treatment: Yes Reason for Co-Treatment: Necessary to address cognition/behavior during functional activity;To address functional/ADL transfers   OT goals addressed during session: ADL's and self-care      AM-PAC OT "6 Clicks" Daily Activity     Outcome Measure Help from another person eating meals?: None Help from another person taking care of personal grooming?: A Little Help from another person toileting, which includes using toliet, bedpan, or urinal?: A Little Help from another person bathing (including washing, rinsing, drying)?: A Little Help from another person to put on and taking off regular upper body clothing?: None Help from another person to put on and taking off regular lower body clothing?: A Little 6 Click Score: 20   End of Session Equipment Utilized During Treatment: Gait belt;Rolling walker Nurse Communication: Mobility status  Activity Tolerance: Treatment limited secondary to medical complications (Comment) Patient left: in chair;with call bell/phone within reach;with chair alarm set  OT Visit Diagnosis: Unsteadiness on feet (R26.81);Muscle weakness (generalized) (M62.81)                Time: 5974-1638 OT Time Calculation (min): 34 min Charges:  OT General Charges $OT Visit: 1 Visit OT Evaluation $OT Eval Moderate Complexity: 1 Mod  Darryl Nestle) Marsa Aris OTR/L Acute Rehabilitation Services Pager: [phone] Office: [place] 02/18/2019, 2:26 PM

## 2019-02-18 NOTE — H&P (Signed)
History and Physical    Stephanie [name] ZJQ:[id number] DOB: 03/01/1934 DOA: 02/17/2019  PCP: Abner Greenspan, MD  Patient coming from: Home.  Chief Complaint: Confusion.  HPI: Stephanie [name] is a 85 y.o. female with history of diabetes mellitus, CAD status post stenting, hypertension, chronic anemia who was discharged yesterday morning after cardiac cath was found to be confused by her husband.  Patient was admitted and discharged yesterday after being found to be in SVT following which patient had demand ischemia cardiac cath was unremarkable.  After reaching home patient had a fall and when patient husband checked on her she was on the floor lying confused and EMS was called.  EMS found the patient had left facial droop.  Was brought to the ER.  ED Course: In the ER patient left facial droop largely resolved CT head and C-spine did not show anything acute per MRI brain shows left temporal infarct.  On-call neurology was consulted and admitted for further stroke work-up.  Lab work show sodium 130 bicarb 20 creatinine 0.9 hemoglobin 11.8 platelets 249 EKG normal sinus rhythm COVID-19 test is pending.  Review of Systems: As per HPI, rest all negative.   Past Medical History:  Diagnosis Date   Allergy    allergic rhinitis   Anxiety    Arthritis    osteoarthritis   Degenerative disk disease    in neck   Diabetes mellitus    type II   Fracture of femoral neck, left ([place]) 01/21/2018   Hyperlipidemia    Hypertension    Hypoaldosteronism ([place]) 6/12   from DM causing hyperkalemia    Osteopenia    Periodontal disease    Peripheral vascular disease ([place])    Transient cerebral ischemia 11/06/2016    Past Surgical History:  Procedure Laterality Date   CARDIAC CATHETERIZATION     x2 STENT   CHOLECYSTECTOMY     ESOPHAGOGASTRODUODENOSCOPY  03/2004   gastritis by biopsy   GUM SURGERY  06/2010   laser surgery for gums   HIP ARTHROPLASTY Left 01/21/2018   Procedure:  ARTHROPLASTY BIPOLAR HIP (HEMIARTHROPLASTY);  Surgeon: Marchia Bond, MD;  Location: [facility];  Service: Orthopedics;  Laterality: Left;   IR RADIOLOGIST EVAL & MGMT  06/17/2018   LEFT HEART CATH AND CORONARY ANGIOGRAPHY N/A 02/16/2019   Procedure: LEFT HEART CATH AND CORONARY ANGIOGRAPHY;  Surgeon: Wellington Hampshire, MD;  Location: [facility];  Service: Cardiovascular;  Laterality: N/A;   MM BREAST STEREO BX*L*R/S  12/12   normal   TONSILLECTOMY     TRANSTHORACIC ECHOCARDIOGRAM  11/2016   EF 55-60%, grade 1 DD with elevated LV end diastolic filling pressures, mildly dilated LA, mild TR and PR.    TUBAL LIGATION       reports that she has never smoked. She has never used smokeless tobacco. She reports that she does not drink alcohol or use drugs.  Allergies  Allergen Reactions   Ace Inhibitors Other (See Comments)    Chest pain   Amlodipine Besy-Benazepril Hcl Other (See Comments)    Chest pain   Clopidogrel Bisulfate Other (See Comments)    GI upset   Erythromycin Other (See Comments)    GI upset   Tape Other (See Comments)    Causes soreness (of the skin)    Family History  Problem Relation Age of Onset   Cancer Mother        vaginal CA in situ   Hypertension Mother  Heart disease Mother        CAD and MI   Heart disease Father        CAD   Diabetes Father    Heart disease Son        congenital valve problem    Prior to Admission medications   Medication Sig Start Date End Date Taking? Authorizing Provider  atorvastatin (LIPITOR) 20 MG tablet Take 1 tablet (20 mg total) by mouth daily. 02/17/19   Sande Rives E, PA-C  chlorthalidone (HYGROTON) 25 MG tablet Take 1 tablet by mouth once daily Patient taking differently: Take 25 mg by mouth daily.  12/14/18   Tower, Wynelle Fanny, MD  dipyridamole-aspirin (AGGRENOX) 200-25 MG 12hr capsule Take 1 capsule by mouth twice daily Patient taking differently: Take 1 capsule by mouth 2 (two) times daily.  09/24/18    Tower, Wynelle Fanny, MD  famotidine (PEPCID) 40 MG tablet Take 1 tablet (40 mg total) by mouth daily. Patient taking differently: Take 40 mg by mouth See admin instructions. Take 40 mg by mouth one to two times a day 04/28/18   Thornton Park, MD  glipiZIDE (GLIPIZIDE XL) 10 MG 24 hr tablet TAKE 1 TABLET BY MOUTH ONCE DAILY WITH BREAKFAST Patient taking differently: Take 10 mg by mouth daily with breakfast.  02/16/18   Tower, Wynelle Fanny, MD  hydrALAZINE (APRESOLINE) 50 MG tablet Take 1 tablet (50 mg total) by mouth every 8 (eight) hours. 02/17/19   Darreld Mclean, PA-C  metFORMIN (GLUCOPHAGE) 1000 MG tablet Take 1 tablet (1,000 mg total) by mouth 2 (two) times daily with a meal. 02/18/19   Sande Rives E, PA-C  metoprolol succinate (TOPROL-XL) 100 MG 24 hr tablet TAKE 1 TABLET BY MOUTH ONCE DAILY TAKE  WITH  OR  IMMEDIATELY  FOLLOWING  A  MEAL 02/17/19   Sande Rives E, PA-C  nitroGLYCERIN (NITROSTAT) 0.4 MG SL tablet Place 1 tablet (0.4 mg total) under the tongue every 5 (five) minutes x 3 doses as needed for chest pain. 02/17/19   Sande Rives E, PA-C  PARoxetine (PAXIL) 40 MG tablet Take 1 tablet by mouth daily Patient taking differently: Take 40 mg by mouth at bedtime.  10/02/18   Tower, Wynelle Fanny, MD    Physical Exam: Constitutional: Moderately built and nourished. Vitals:   02/17/19 1820 02/17/19 2053 02/17/19 2337 02/18/19 0012  BP: (!) 177/99 (!) 141/78 (!) 197/76 (!) 151/90  Pulse:  72 63 61  Resp:   16 18  Temp:    98.4 F (36.9 C)  TempSrc:    Oral  SpO2:   99% 99%  Weight:    47.7 kg  Height:    5\' 2"  (1.575 m)   Eyes: Anicteric no pallor. ENMT: No discharge from the ears eyes nose or mouth. Neck: No mass felt.  No neck rigidity. Respiratory: No rhonchi or crepitations. Cardiovascular: S1-S2 heard. Abdomen: Soft nontender bowel sounds present. Musculoskeletal: No edema. Skin: No rash. Neurologic: Alert awake oriented to time place and person.  Moves all extremities 5 x 5  no facial asymmetry tongue is midline pupils are equal and reacting to light. Psychiatric: Appears normal.   Labs on Admission: I have personally reviewed following labs and imaging studies  CBC: Recent Labs  Lab 02/15/19 1601 02/16/19 0219 02/17/19 0236 02/17/19 1535 02/17/19 1539  WBC 7.9 10.3 10.8* 10.5  --   NEUTROABS  --   --   --  6.6  --   HGB 11.0* 11.1*  10.9* 11.8* 12.2  HCT 32.9* 31.9* 32.3* 34.9* 36.0  MCV 94.0 91.4 92.0 92.1  --   PLT 224 226 246 249  --    Basic Metabolic Panel: Recent Labs  Lab 02/15/19 1601 02/15/19 1801 02/16/19 0219 02/17/19 0911 02/17/19 1535 02/17/19 1539  NA 137  --  134* 129* 130* 129*  K 4.2  --  4.1 4.2 3.9 3.9  CL 101  --  100 96* 96* 96*  CO2 25  --  22 19* 20*  --   GLUCOSE 82  --  106* 181* 141* 140*  BUN 21  --  20 20 21 21   CREATININE 1.04*  --  0.99 1.11* 0.96 1.00  CALCIUM 9.7  --  9.4 9.0 9.3  --   MG  --  1.6*  --   --   --   --    GFR: Estimated Creatinine Clearance: 32.1 mL/min (by C-G formula based on SCr of 1 mg/dL). Liver Function Tests: Recent Labs  Lab 02/17/19 1535  AST 24  ALT 11  ALKPHOS 52  BILITOT 0.8  PROT 6.7  ALBUMIN 3.7   No results for input(s): LIPASE, AMYLASE in the last 168 hours. No results for input(s): AMMONIA in the last 168 hours. Coagulation Profile: Recent Labs  Lab 02/17/19 1535  INR 1.1   Cardiac Enzymes: No results for input(s): CKTOTAL, CKMB, CKMBINDEX, TROPONINI in the last 168 hours. BNP (last 3 results) No results for input(s): PROBNP in the last 8760 hours. HbA1C: No results for input(s): HGBA1C in the last 72 hours. CBG: Recent Labs  Lab 02/15/19 2058 02/16/19 0633 02/16/19 2200 02/17/19 0625 02/17/19 1451  GLUCAP 70 117* 112* 134* 140*   Lipid Profile: Recent Labs    02/16/19 0219  CHOL 116  HDL 39*  LDLCALC 25  TRIG 259*  CHOLHDL 3.0   Thyroid Function Tests: Recent Labs    02/16/19 0219  TSH 1.406  FREET4 0.91   Anemia Panel: No  results for input(s): VITAMINB12, FOLATE, FERRITIN, TIBC, IRON, RETICCTPCT in the last 72 hours. Urine analysis:    Component Value Date/Time   COLORURINE YELLOW 02/17/2019 1817   APPEARANCEUR HAZY (A) 02/17/2019 1817   LABSPEC 1.017 02/17/2019 1817   PHURINE 6.0 02/17/2019 1817   GLUCOSEU NEGATIVE 02/17/2019 1817   HGBUR NEGATIVE 02/17/2019 1817   BILIRUBINUR NEGATIVE 02/17/2019 1817   KETONESUR 5 (A) 02/17/2019 1817   PROTEINUR 100 (A) 02/17/2019 1817   NITRITE NEGATIVE 02/17/2019 1817   LEUKOCYTESUR NEGATIVE 02/17/2019 1817   Sepsis Labs: @LABRCNTIP (procalcitonin:4,lacticidven:4) ) Recent Results (from the past 240 hour(s))  SARS Coronavirus 2 [facility] order, Performed in [facility] hospital lab) Nasopharyngeal Nasopharyngeal Swab     Status: None   Collection Time: 02/15/19  5:53 PM   Specimen: Nasopharyngeal Swab  Result Value Ref Range Status   SARS Coronavirus 2 NEGATIVE NEGATIVE Final    Comment: (NOTE) If result is NEGATIVE SARS-CoV-2 target nucleic acids are NOT DETECTED. The SARS-CoV-2 RNA is generally detectable in upper and lower  respiratory specimens during the acute phase of infection. The lowest  concentration of SARS-CoV-2 viral copies this assay can detect is 250  copies / mL. A negative result does not preclude SARS-CoV-2 infection  and should not be used as the sole basis for treatment or other  patient management decisions.  A negative result may occur with  improper specimen collection / handling, submission of specimen other  than nasopharyngeal swab, presence of viral mutation(s) within  the  areas targeted by this assay, and inadequate number of viral copies  (<250 copies / mL). A negative result must be combined with clinical  observations, patient history, and epidemiological information. If result is POSITIVE SARS-CoV-2 target nucleic acids are DETECTED. The SARS-CoV-2 RNA is generally detectable in upper and lower  respiratory specimens  dur ing the acute phase of infection.  Positive  results are indicative of active infection with SARS-CoV-2.  Clinical  correlation with patient history and other diagnostic information is  necessary to determine patient infection status.  Positive results do  not rule out bacterial infection or co-infection with other viruses. If result is PRESUMPTIVE POSTIVE SARS-CoV-2 nucleic acids MAY BE PRESENT.   A presumptive positive result was obtained on the submitted specimen  and confirmed on repeat testing.  While 2019 novel coronavirus  (SARS-CoV-2) nucleic acids may be present in the submitted sample  additional confirmatory testing may be necessary for epidemiological  and / or clinical management purposes  to differentiate between  SARS-CoV-2 and other Sarbecovirus currently known to infect humans.  If clinically indicated additional testing with an alternate test  methodology [phone]) is advised. The SARS-CoV-2 RNA is generally  detectable in upper and lower respiratory sp ecimens during the acute  phase of infection. The expected result is Negative. Fact Sheet for Patients:  StrictlyIdeas.no Fact Sheet for Healthcare Providers: BankingDealers.co.za This test is not yet approved or cleared by the [place] FDA and has been authorized for detection and/or diagnosis of SARS-CoV-2 by FDA under an Emergency Use Authorization (EUA).  This EUA will remain in effect (meaning this test can be used) for the duration of the COVID-19 declaration under Section 564(b)(1) of the Act, 21 U.S.C. section 360bbb-3(b)(1), unless the authorization is terminated or revoked sooner. Performed at [facility], [place] [address] [place], [place] [address]   MRSA PCR Screening     Status: None   Collection Time: 02/15/19  8:36 PM   Specimen: Nasal Mucosa; Nasopharyngeal  Result Value Ref Range Status   MRSA by PCR NEGATIVE NEGATIVE Final    Comment:         The GeneXpert MRSA Assay (FDA approved for NASAL specimens only), is one component of a comprehensive MRSA colonization surveillance program. It is not intended to diagnose MRSA infection nor to guide or monitor treatment for MRSA infections. Performed at [facility], [place] [address] [place], [place] [address]      Radiological Exams on Admission: Ct Head Wo Contrast  Result Date: 02/17/2019 CLINICAL DATA:  Altered mental status following a fall today. EXAM: CT HEAD WITHOUT CONTRAST CT CERVICAL SPINE WITHOUT CONTRAST TECHNIQUE: Multidetector CT imaging of the head and cervical spine was performed following the standard protocol without intravenous contrast. Multiplanar CT image reconstructions of the cervical spine were also generated. COMPARISON:  Head and cervical spine CT report dated 01/21/2018 and head CT dated 03/28/2017. FINDINGS: CT HEAD FINDINGS Brain: No significant change in mild-to-moderate enlargement of the ventricles and subarachnoid spaces. Moderate patchy white matter low density in both cerebral hemispheres with mild progression. No intracranial hemorrhage, mass lesion or CT evidence of acute infarction. Vascular: No hyperdense vessel or unexpected calcification. Skull: Normal. Negative for fracture or focal lesion. Sinuses/Orbits: Unremarkable. Other: None. CT CERVICAL SPINE FINDINGS Alignment: Mild dextroconvex cervicothoracic scoliosis. No subluxations. Skull base and vertebrae: No acute fracture. No primary bone lesion or focal pathologic process. Soft tissues and spinal canal: No prevertebral fluid or swelling. No visible  canal hematoma. Disc levels: Multilevel degenerative changes, most pronounced at the C5-6 and C6-7 levels. Upper chest: Minimal biapical pleural and parenchymal scarring. Other: 1.2 cm right lobe thyroid nodule an additional smaller poorly defined nodules. Dense bilateral carotid artery calcifications. IMPRESSION: 1. No skull fracture or  intracranial hemorrhage. 2. No cervical spine fracture or subluxation. 3. Stable mild-to-moderate diffuse cerebral and cerebellar atrophy. 4. Moderate small vessel white matter ischemic changes in both cerebral hemispheres with mild progression. 5. Multilevel cervical spine degenerative changes. 6. Dense bilateral carotid artery atheromatous calcifications. 7. Multinodular thyroid gland with a 1.2 cm heterogeneous nodule on the right. Consider further evaluation with elective thyroid ultrasound. If patient is clinically hyperthyroid, consider nuclear medicine thyroid uptake and scan. Electronically Signed   By: Claudie Revering M.D.   On: 02/17/2019 17:58   Ct Cervical Spine Wo Contrast  Result Date: 02/17/2019 CLINICAL DATA:  Altered mental status following a fall today. EXAM: CT HEAD WITHOUT CONTRAST CT CERVICAL SPINE WITHOUT CONTRAST TECHNIQUE: Multidetector CT imaging of the head and cervical spine was performed following the standard protocol without intravenous contrast. Multiplanar CT image reconstructions of the cervical spine were also generated. COMPARISON:  Head and cervical spine CT report dated 01/21/2018 and head CT dated 03/28/2017. FINDINGS: CT HEAD FINDINGS Brain: No significant change in mild-to-moderate enlargement of the ventricles and subarachnoid spaces. Moderate patchy white matter low density in both cerebral hemispheres with mild progression. No intracranial hemorrhage, mass lesion or CT evidence of acute infarction. Vascular: No hyperdense vessel or unexpected calcification. Skull: Normal. Negative for fracture or focal lesion. Sinuses/Orbits: Unremarkable. Other: None. CT CERVICAL SPINE FINDINGS Alignment: Mild dextroconvex cervicothoracic scoliosis. No subluxations. Skull base and vertebrae: No acute fracture. No primary bone lesion or focal pathologic process. Soft tissues and spinal canal: No prevertebral fluid or swelling. No visible canal hematoma. Disc levels: Multilevel degenerative  changes, most pronounced at the C5-6 and C6-7 levels. Upper chest: Minimal biapical pleural and parenchymal scarring. Other: 1.2 cm right lobe thyroid nodule an additional smaller poorly defined nodules. Dense bilateral carotid artery calcifications. IMPRESSION: 1. No skull fracture or intracranial hemorrhage. 2. No cervical spine fracture or subluxation. 3. Stable mild-to-moderate diffuse cerebral and cerebellar atrophy. 4. Moderate small vessel white matter ischemic changes in both cerebral hemispheres with mild progression. 5. Multilevel cervical spine degenerative changes. 6. Dense bilateral carotid artery atheromatous calcifications. 7. Multinodular thyroid gland with a 1.2 cm heterogeneous nodule on the right. Consider further evaluation with elective thyroid ultrasound. If patient is clinically hyperthyroid, consider nuclear medicine thyroid uptake and scan. Electronically Signed   By: Claudie Revering M.D.   On: 02/17/2019 17:58   Mr Brain Wo Contrast  Result Date: 02/17/2019 CLINICAL DATA:  TIA. Recent fall. Cardiac catheterization 02/16/2019 EXAM: MRI HEAD WITHOUT CONTRAST TECHNIQUE: Multiplanar, multiecho pulse sequences of the brain and surrounding structures were obtained without intravenous contrast. COMPARISON:  CT headache 11/2018 FINDINGS: Brain: Limited study. The patient was moving and not able to complete the study. Small focus of acute infarct in the left posterior temporal lobe measuring 5 mm. No other acute infarct Moderate atrophy. Chronic ischemic changes in the white matter, basal ganglia, and pons bilaterally. Negative for hemorrhage or fluid collection. Vascular: Normal arterial flow voids. IMPRESSION: Motion degraded and incomplete study 5 mm acute infarct left posterior temporal lobe Moderate atrophy and moderate chronic ischemic changes. Electronically Signed   By: Franchot Gallo M.D.   On: 02/17/2019 21:26    EKG: Independently reviewed.  Normal sinus  rhythm.  Assessment/Plan Principal Problem:   Acute CVA (cerebrovascular accident) ([place]) Active Problems:   Type 2 diabetes mellitus ([facility])   HTN (hypertension)   HLD (hyperlipidemia)   CAD (coronary artery disease)   Chronic kidney disease (CKD), stage II (mild)   SVT (supraventricular tachycardia) ([place])    1. Acute CVA -discussed with on-call neurologist Dr. Lorraine Lax who at this time is planning to place patient on aspirin and Plavix.  Patient is on Lipitor.  Patient passed swallow.  Check carotid Doppler MRI brain and continue to monitor in telemetry.  Physical therapy consult.  Check hemoglobin A1c lipid panel. 2. Hpertension uncontrolled we will allow for permissive hypertension.  Patient is on hydralazine and Toprol.  Hydrochlorothiazide was just recently started yesterday by cardiology.  Will hold off hydrochlorothiazide due to stroke.  PRN IV hydralazine for systolic more than 967 diastolic more than 893. 3. History of recently diagnosed SVT on Toprol.  Monitor in telemetry. 4. CAD status post ending recent cardiac cath unremarkable.  On aspirin statins and beta-blocker. 5. Chronic anemia follow CBC. 6. Diabetes mellitus type 2 we will keep patient on sliding scale coverage.   DVT prophylaxis: Lovenox. Code Status: Full code. Family Communication: Discussed with patient. Disposition Plan: Home. Consults called: Neurology. Admission status: Observation.   [name] MD [facility] Pager [phone].  If 7PM-7AM, please contact night-coverage [email or web address] Password [email or web address]  02/18/2019, 1:15 AM

## 2019-02-18 NOTE — Progress Notes (Signed)
Patient seen and examined at bedside, patient admitted after midnight, please see earlier detailed admission note by [name], MD. Briefly, patient presented secondary to confusion and found to have an acute stroke, possibly incidental. Also with mild hyponatremia. hydrochlorothiazide held which is possible cause of hyponatremia. Appears euvolemic on exam. Confusion improved and patient at baseline. Will repeat BMP this afternoon to check trajectory. Mild hypokalemia. Recheck on next BMP and replete as needed. Stroke team consulted. Anticipate discharge home in AM pending improvement of sodium and pending stroke team recommendations.   Cordelia Poche, MD [facility] 02/18/2019, 3:51 PM

## 2019-02-19 DIAGNOSIS — N182 Chronic kidney disease, stage 2 (mild): Secondary | ICD-10-CM

## 2019-02-19 DIAGNOSIS — I10 Essential (primary) hypertension: Secondary | ICD-10-CM

## 2019-02-19 LAB — BASIC METABOLIC PANEL
Anion gap: 15 (ref 5–15)
Anion gap: 11 (ref 5–15)
Anion gap: 11 (ref 5–15)
BUN: 27 mg/dL — ABNORMAL HIGH (ref 8–23)
BUN: 28 mg/dL — ABNORMAL HIGH (ref 8–23)
BUN: 28 mg/dL — ABNORMAL HIGH (ref 8–23)
CO2: 18 mmol/L — ABNORMAL LOW (ref 22–32)
CO2: 19 mmol/L — ABNORMAL LOW (ref 22–32)
CO2: 20 mmol/L — ABNORMAL LOW (ref 22–32)
Calcium: 8.9 mg/dL (ref 8.9–10.3)
Calcium: 8.2 mg/dL — ABNORMAL LOW (ref 8.9–10.3)
Calcium: 8.2 mg/dL — ABNORMAL LOW (ref 8.9–10.3)
Chloride: 92 mmol/L — ABNORMAL LOW (ref 98–111)
Chloride: 97 mmol/L — ABNORMAL LOW (ref 98–111)
Chloride: 96 mmol/L — ABNORMAL LOW (ref 98–111)
Creatinine, Ser: 1.2 mg/dL — ABNORMAL HIGH (ref 0.44–1.00)
Creatinine, Ser: 1.4 mg/dL — ABNORMAL HIGH (ref 0.44–1.00)
Creatinine, Ser: 1.5 mg/dL — ABNORMAL HIGH (ref 0.44–1.00)
GFR calc Af Amer: 48 mL/min — ABNORMAL LOW (ref >60)
GFR calc Af Amer: 37 mL/min — ABNORMAL LOW (ref >60)
GFR calc non Af Amer: 42 mL/min — ABNORMAL LOW (ref >60)
GFR calc non Af Amer: 32 mL/min — ABNORMAL LOW (ref >60)
Glucose, Bld: 157 mg/dL — ABNORMAL HIGH (ref 70–99)
Glucose, Bld: 241 mg/dL — ABNORMAL HIGH (ref 70–99)
Glucose, Bld: 168 mg/dL — ABNORMAL HIGH (ref 70–99)
Potassium: 3.7 mmol/L (ref 3.5–5.1)
Potassium: 3.7 mmol/L (ref 3.5–5.1)
Potassium: 3.7 mmol/L (ref 3.5–5.1)
Sodium: 125 mmol/L — ABNORMAL LOW (ref 135–145)
Sodium: 127 mmol/L — ABNORMAL LOW (ref 135–145)
Sodium: 127 mmol/L — ABNORMAL LOW (ref 135–145)

## 2019-02-19 LAB — GLUCOSE, CAPILLARY
Glucose-Capillary: 160 mg/dL — ABNORMAL HIGH (ref 70–99)
Glucose-Capillary: 157 mg/dL — ABNORMAL HIGH (ref 70–99)
Glucose-Capillary: 236 mg/dL — ABNORMAL HIGH (ref 70–99)
Glucose-Capillary: 101 mg/dL — ABNORMAL HIGH (ref 70–99)
Glucose-Capillary: 153 mg/dL — ABNORMAL HIGH (ref 70–99)

## 2019-02-19 MED ORDER — SODIUM CHLORIDE 1 G PO TABS
1.0000 g | ORAL_TABLET | Freq: Once | ORAL | Status: AC
  Administered 2019-02-19: 1 g via ORAL
  Filled 2019-02-19: qty 1

## 2019-02-19 MED ORDER — SODIUM CHLORIDE 0.9 % IV BOLUS
1000.0000 mL | Freq: Once | INTRAVENOUS | Status: AC
  Administered 2019-02-19: 1000 mL via INTRAVENOUS

## 2019-02-19 MED ORDER — SODIUM CHLORIDE 0.9 % IV SOLN
INTRAVENOUS | Status: DC
  Administered 2019-02-19: 18:00:00 via INTRAVENOUS

## 2019-02-19 MED ORDER — SODIUM CHLORIDE 1 G PO TABS
1.0000 g | ORAL_TABLET | Freq: Three times a day (TID) | ORAL | Status: DC
  Administered 2019-02-19 – 2019-02-20 (×3): 1 g via ORAL
  Filled 2019-02-19 (×4): qty 1

## 2019-02-19 NOTE — Progress Notes (Signed)
Pt pulled out her PIV SL stating I'm   going home  so I don't need it RN explained to pt that she might need it for IV BP medication if BP is high but pt refused a restart.

## 2019-02-19 NOTE — Progress Notes (Signed)
Physical Therapy Treatment Patient Details Name: Stephanie [name] MRN: [id number] DOB: 05-Nov-1933 Today's Date: 02/19/2019    History of Present Illness Stephanie [name] is a 85 y.o. female with past medical history of hypertension, diabetes mellitus, coronary artery disease status post stent, chronic anemia  with the recent cardiac catheterization and admission for SVT presents to the ED after husband found patient confused after a fall. MRI shows L temporal infarct.    PT Comments    Pt feeling better today and able to ambulate in the hallway with RW and min/guard. She has a tendency to verre L with gait.  Recommend HHPT.   Follow Up Recommendations  Home health PT     Equipment Recommendations  None recommended by PT    Recommendations for Other Services       Precautions / Restrictions Precautions Precautions: Fall Restrictions Weight Bearing Restrictions: No    Mobility  Bed Mobility Overal bed mobility: Needs Assistance Bed Mobility: Supine to Sit     Supine to sit: Min guard     General bed mobility comments: Able to to get to EOB with bed flat with increased time and some difficulty  Transfers Overall transfer level: Needs assistance Equipment used: Rolling walker (2 wheeled) Transfers: Sit to/from Stand Sit to Stand: Min guard         General transfer comment: stood from bed and toilet with cues for hand placement  Ambulation/Gait Ambulation/Gait assistance: Min guard Gait Distance (Feet): 220 Feet Assistive device: Rolling walker (2 wheeled) Gait Pattern/deviations: Drifts right/left;Decreased step length - right;Decreased step length - left;Narrow base of support Gait velocity: WFL   General Gait Details: Pt tends to veer L with gait and ran into 2 items during gait.  Pt states she runs into things at home.  Cues for safe positioning with RW and safe gait overall   Stairs             Wheelchair Mobility    Modified Rankin (Stroke Patients  Only) Modified Rankin (Stroke Patients Only) Pre-Morbid Rankin Score: Slight disability Modified Rankin: Moderate disability     Balance Overall balance assessment: Needs assistance;History of Falls Sitting-balance support: Single extremity supported;Feet supported Sitting balance-Leahy Scale: Fair       Standing balance-Leahy Scale: Poor                              Cognition Arousal/Alertness: Awake/alert Behavior During Therapy: WFL for tasks assessed/performed;Flat affect Overall Cognitive Status: Within Functional Limits for tasks assessed                                        Exercises      General Comments        Pertinent Vitals/Pain Pain Assessment: No/denies pain    Home Living                      Prior Function            PT Goals (current goals can now be found in the care plan section) Acute Rehab PT Goals Patient Stated Goal: to feel better PT Goal Formulation: With patient Time For Goal Achievement: 03/04/19 Potential to Achieve Goals: Good Progress towards PT goals: Progressing toward goals    Frequency    Min 4X/week      PT Plan  Current plan remains appropriate    Co-evaluation              AM-PAC PT "6 Clicks" Mobility   Outcome Measure  Help needed turning from your back to your side while in a flat bed without using bedrails?: A Little Help needed moving from lying on your back to sitting on the side of a flat bed without using bedrails?: A Little Help needed moving to and from a bed to a chair (including a wheelchair)?: A Little Help needed standing up from a chair using your arms (e.g., wheelchair or bedside chair)?: A Little Help needed to walk in hospital room?: A Little Help needed climbing 3-5 steps with a railing? : A Little 6 Click Score: 18    End of Session Equipment Utilized During Treatment: Gait belt Activity Tolerance: Patient tolerated treatment well Patient left:  in chair;with call bell/phone within reach;with chair alarm set;with nursing/sitter in room Nurse Communication: Mobility status PT Visit Diagnosis: Unsteadiness on feet (R26.81);Muscle weakness (generalized) (M62.81);Difficulty in walking, not elsewhere classified (R26.2)     Time: 0938-1829 PT Time Calculation (min) (ACUTE ONLY): 28 min  Charges:  $Gait Training: 8-22 mins $Therapeutic Activity: 8-22 mins                     Hart Haas L. Tamala Julian, [place] Pager [phone] 02/19/2019    Galen Manila 02/19/2019, 12:29 PM

## 2019-02-19 NOTE — Progress Notes (Signed)
PROGRESS NOTE    Stephanie [name]  FTD:[id number] DOB: 1934/07/12 DOA: 02/17/2019 PCP: Abner Greenspan, MD   Brief Narrative: Stephanie [name] is a 85 y.o. female with history of diabetes mellitus, CAD status post stenting, hypertension, chronic anemia. Patient presented secondary to confusion and found to have an incidental stroke.   Assessment & Plan:   Principal Problem:   Acute CVA (cerebrovascular accident) ([place]) Active Problems:   Type 2 diabetes mellitus (HCC)   HTN (hypertension)   HLD (hyperlipidemia)   CAD (coronary artery disease)   Chronic kidney disease (CKD), stage II (mild)   SVT (supraventricular tachycardia) (HCC)   Acute CVA Incidental in setting of recent heart catheterization. Likely not contributory to presentation. -Home health PT, neurology follow-up -Lipitor, diabetes management  Hyponatremia Acute drop. Possibly mediated by hydrochlorothiazide but possible some hypovolemia playing a role. Also on Paxil, which could also be playing a role. Patient appears euvolemic and not hypervolemic at this time.  -Continue IV fluids -Salt tablets  Essential hypertension Stable -Continue hydralazine, metoprolol  Diabetes mellitus, type 2 -Continue SSI  Chronic anemia Hemoglobin stable   DVT prophylaxis: Lovenox Code Status:   Code Status: Full Code Family Communication: None at bedside Disposition Plan: Discharge in 24 hours if sodium improved.   Consultants:   Neurology  Procedures:   None  Antimicrobials:  None    Subjective: Feels normal today.  Objective: Vitals:   02/19/19 1040 02/19/19 1157 02/19/19 1457 02/19/19 1542  BP: (!) 137/47 (!) 140/45 (!) 159/63 (!) 147/51  Pulse: 66 62  67  Resp:  16  16  Temp:  98.7 F (37.1 C)  98.6 F (37 C)  TempSrc:  Oral  Oral  SpO2:  99%  98%  Weight:      Height:        Intake/Output Summary (Last 24 hours) at 02/19/2019 1704 Last data filed at 02/18/2019 2335 Gross per 24 hour  Intake --   Output 600 ml  Net -600 ml   Filed Weights   02/18/19 0012  Weight: 47.7 kg    Examination:  General exam: Appears calm and comfortable Respiratory system: Clear to auscultation. Respiratory effort normal. Cardiovascular system: S1 & S2 heard, RRR. No murmurs, rubs, gallops or clicks. Gastrointestinal system: Abdomen is nondistended, soft and nontender. No organomegaly or masses felt. Normal bowel sounds heard. Central nervous system: Alert and oriented. No focal neurological deficits. Extremities: No edema. No calf tenderness Skin: No cyanosis. No rashes Psychiatry: Judgement and insight appear normal. Mood & affect appropriate.     Data Reviewed: I have personally reviewed following labs and imaging studies  CBC: Recent Labs  Lab 02/15/19 1601 02/16/19 0219 02/17/19 0236 02/17/19 1535 02/17/19 1539 02/18/19 0420  WBC 7.9 10.3 10.8* 10.5  --  10.2  NEUTROABS  --   --   --  6.6  --   --   HGB 11.0* 11.1* 10.9* 11.8* 12.2 10.7*  HCT 32.9* 31.9* 32.3* 34.9* 36.0 30.7*  MCV 94.0 91.4 92.0 92.1  --  90.6  PLT 224 226 246 249  --  062   Basic Metabolic Panel: Recent Labs  Lab 02/15/19 1801  02/17/19 1535 02/17/19 1539 02/18/19 0420 02/18/19 1634 02/19/19 0656 02/19/19 1329  NA  --    < > 130* 129* 128* 129* 125* 127*  K  --    < > 3.9 3.9 3.4* 3.7 3.7 3.7  CL  --    < > 96* 96* 96*  94* 92* 97*  CO2  --    < > 20*  --  21* 25 18* 19*  GLUCOSE  --    < > 141* 140* 132* 79 157* 241*  BUN  --    < > 21 21 19 20  27* 28*  CREATININE  --    < > 0.96 1.00 1.03* 1.09* 1.20* 1.40*  CALCIUM  --    < > 9.3  --  8.8* 9.1 8.9 8.2*  MG 1.6*  --   --   --   --   --   --   --    < > = values in this interval not displayed.   GFR: Estimated Creatinine Clearance: 22.9 mL/min (A) (by C-G formula based on SCr of 1.4 mg/dL (H)). Liver Function Tests: Recent Labs  Lab 02/17/19 1535 02/18/19 0420  AST 24 22  ALT 11 11  ALKPHOS 52 45  BILITOT 0.8 0.5  PROT 6.7 6.2*   ALBUMIN 3.7 3.5   No results for input(s): LIPASE, AMYLASE in the last 168 hours. No results for input(s): AMMONIA in the last 168 hours. Coagulation Profile: Recent Labs  Lab 02/17/19 1535  INR 1.1   Cardiac Enzymes: No results for input(s): CKTOTAL, CKMB, CKMBINDEX, TROPONINI in the last 168 hours. BNP (last 3 results) No results for input(s): PROBNP in the last 8760 hours. HbA1C: Recent Labs    02/17/19 1535  HGBA1C 6.4*   CBG: Recent Labs  Lab 02/18/19 2135 02/19/19 0605 02/19/19 0819 02/19/19 1145 02/19/19 1638  GLUCAP 126* 160* 157* 236* 101*   Lipid Profile: Recent Labs    02/18/19 0420  CHOL 115  HDL 35*  LDLCALC 50  TRIG 149  CHOLHDL 3.3   Thyroid Function Tests: No results for input(s): TSH, T4TOTAL, FREET4, T3FREE, THYROIDAB in the last 72 hours. Anemia Panel: No results for input(s): VITAMINB12, FOLATE, FERRITIN, TIBC, IRON, RETICCTPCT in the last 72 hours. Sepsis Labs: No results for input(s): PROCALCITON, LATICACIDVEN in the last 168 hours.  Recent Results (from the past 240 hour(s))  SARS Coronavirus 2 [facility] order, Performed in [facility] hospital lab) Nasopharyngeal Nasopharyngeal Swab     Status: None   Collection Time: 02/15/19  5:53 PM   Specimen: Nasopharyngeal Swab  Result Value Ref Range Status   SARS Coronavirus 2 NEGATIVE NEGATIVE Final    Comment: (NOTE) If result is NEGATIVE SARS-CoV-2 target nucleic acids are NOT DETECTED. The SARS-CoV-2 RNA is generally detectable in upper and lower  respiratory specimens during the acute phase of infection. The lowest  concentration of SARS-CoV-2 viral copies this assay can detect is 250  copies / mL. A negative result does not preclude SARS-CoV-2 infection  and should not be used as the sole basis for treatment or other  patient management decisions.  A negative result may occur with  improper specimen collection / handling, submission of specimen other  than nasopharyngeal swab,  presence of viral mutation(s) within the  areas targeted by this assay, and inadequate number of viral copies  (<250 copies / mL). A negative result must be combined with clinical  observations, patient history, and epidemiological information. If result is POSITIVE SARS-CoV-2 target nucleic acids are DETECTED. The SARS-CoV-2 RNA is generally detectable in upper and lower  respiratory specimens dur ing the acute phase of infection.  Positive  results are indicative of active infection with SARS-CoV-2.  Clinical  correlation with patient history and other diagnostic information is  necessary to determine patient  infection status.  Positive results do  not rule out bacterial infection or co-infection with other viruses. If result is PRESUMPTIVE POSTIVE SARS-CoV-2 nucleic acids MAY BE PRESENT.   A presumptive positive result was obtained on the submitted specimen  and confirmed on repeat testing.  While 2019 novel coronavirus  (SARS-CoV-2) nucleic acids may be present in the submitted sample  additional confirmatory testing may be necessary for epidemiological  and / or clinical management purposes  to differentiate between  SARS-CoV-2 and other Sarbecovirus currently known to infect humans.  If clinically indicated additional testing with an alternate test  methodology [phone]) is advised. The SARS-CoV-2 RNA is generally  detectable in upper and lower respiratory sp ecimens during the acute  phase of infection. The expected result is Negative. Fact Sheet for Patients:  StrictlyIdeas.no Fact Sheet for Healthcare Providers: BankingDealers.co.za This test is not yet approved or cleared by the [place] FDA and has been authorized for detection and/or diagnosis of SARS-CoV-2 by FDA under an Emergency Use Authorization (EUA).  This EUA will remain in effect (meaning this test can be used) for the duration of the COVID-19 declaration under  Section 564(b)(1) of the Act, 21 U.S.C. section 360bbb-3(b)(1), unless the authorization is terminated or revoked sooner. Performed at [facility], [place] [address], [place], [place] [address]   MRSA PCR Screening     Status: None   Collection Time: 02/15/19  8:36 PM   Specimen: Nasal Mucosa; Nasopharyngeal  Result Value Ref Range Status   MRSA by PCR NEGATIVE NEGATIVE Final    Comment:        The GeneXpert MRSA Assay (FDA approved for NASAL specimens only), is one component of a comprehensive MRSA colonization surveillance program. It is not intended to diagnose MRSA infection nor to guide or monitor treatment for MRSA infections. Performed at [facility], [place] [address] [place], [place] [address]   SARS CORONAVIRUS 2 Nasal Swab Aptima Multi Swab     Status: None   Collection Time: 02/17/19 10:25 PM   Specimen: Aptima Multi Swab; Nasal Swab  Result Value Ref Range Status   SARS Coronavirus 2 NEGATIVE NEGATIVE Final    Comment: (NOTE) SARS-CoV-2 target nucleic acids are NOT DETECTED. The SARS-CoV-2 RNA is generally detectable in upper and lower respiratory specimens during the acute phase of infection. Negative results do not preclude SARS-CoV-2 infection, do not rule out co-infections with other pathogens, and should not be used as the sole basis for treatment or other patient management decisions. Negative results must be combined with clinical observations, patient history, and epidemiological information. The expected result is Negative. Fact Sheet for Patients: SugarRoll.be Fact Sheet for Healthcare Providers: https://www.woods-mathews.com/ This test is not yet approved or cleared by the [place] FDA and  has been authorized for detection and/or diagnosis of SARS-CoV-2 by FDA under an Emergency Use Authorization (EUA). This EUA will remain  in effect (meaning this test can be used) for the duration of  the COVID-19 declaration under Section 56 4(b)(1) of the Act, 21 U.S.C. section 360bbb-3(b)(1), unless the authorization is terminated or revoked sooner. Performed at [facility], [place] [address], [place], [place] [address]          Radiology Studies: Ct Head Wo Contrast  Result Date: 02/17/2019 CLINICAL DATA:  Altered mental status following a fall today. EXAM: CT HEAD WITHOUT CONTRAST CT CERVICAL SPINE WITHOUT CONTRAST TECHNIQUE: Multidetector CT imaging of the head and cervical spine was performed following  the standard protocol without intravenous contrast. Multiplanar CT image reconstructions of the cervical spine were also generated. COMPARISON:  Head and cervical spine CT report dated 01/21/2018 and head CT dated 03/28/2017. FINDINGS: CT HEAD FINDINGS Brain: No significant change in mild-to-moderate enlargement of the ventricles and subarachnoid spaces. Moderate patchy white matter low density in both cerebral hemispheres with mild progression. No intracranial hemorrhage, mass lesion or CT evidence of acute infarction. Vascular: No hyperdense vessel or unexpected calcification. Skull: Normal. Negative for fracture or focal lesion. Sinuses/Orbits: Unremarkable. Other: None. CT CERVICAL SPINE FINDINGS Alignment: Mild dextroconvex cervicothoracic scoliosis. No subluxations. Skull base and vertebrae: No acute fracture. No primary bone lesion or focal pathologic process. Soft tissues and spinal canal: No prevertebral fluid or swelling. No visible canal hematoma. Disc levels: Multilevel degenerative changes, most pronounced at the C5-6 and C6-7 levels. Upper chest: Minimal biapical pleural and parenchymal scarring. Other: 1.2 cm right lobe thyroid nodule an additional smaller poorly defined nodules. Dense bilateral carotid artery calcifications. IMPRESSION: 1. No skull fracture or intracranial hemorrhage. 2. No cervical spine fracture or subluxation. 3. Stable mild-to-moderate diffuse cerebral  and cerebellar atrophy. 4. Moderate small vessel white matter ischemic changes in both cerebral hemispheres with mild progression. 5. Multilevel cervical spine degenerative changes. 6. Dense bilateral carotid artery atheromatous calcifications. 7. Multinodular thyroid gland with a 1.2 cm heterogeneous nodule on the right. Consider further evaluation with elective thyroid ultrasound. If patient is clinically hyperthyroid, consider nuclear medicine thyroid uptake and scan. Electronically Signed   By: Claudie Revering M.D.   On: 02/17/2019 17:58   Ct Cervical Spine Wo Contrast  Result Date: 02/17/2019 CLINICAL DATA:  Altered mental status following a fall today. EXAM: CT HEAD WITHOUT CONTRAST CT CERVICAL SPINE WITHOUT CONTRAST TECHNIQUE: Multidetector CT imaging of the head and cervical spine was performed following the standard protocol without intravenous contrast. Multiplanar CT image reconstructions of the cervical spine were also generated. COMPARISON:  Head and cervical spine CT report dated 01/21/2018 and head CT dated 03/28/2017. FINDINGS: CT HEAD FINDINGS Brain: No significant change in mild-to-moderate enlargement of the ventricles and subarachnoid spaces. Moderate patchy white matter low density in both cerebral hemispheres with mild progression. No intracranial hemorrhage, mass lesion or CT evidence of acute infarction. Vascular: No hyperdense vessel or unexpected calcification. Skull: Normal. Negative for fracture or focal lesion. Sinuses/Orbits: Unremarkable. Other: None. CT CERVICAL SPINE FINDINGS Alignment: Mild dextroconvex cervicothoracic scoliosis. No subluxations. Skull base and vertebrae: No acute fracture. No primary bone lesion or focal pathologic process. Soft tissues and spinal canal: No prevertebral fluid or swelling. No visible canal hematoma. Disc levels: Multilevel degenerative changes, most pronounced at the C5-6 and C6-7 levels. Upper chest: Minimal biapical pleural and parenchymal  scarring. Other: 1.2 cm right lobe thyroid nodule an additional smaller poorly defined nodules. Dense bilateral carotid artery calcifications. IMPRESSION: 1. No skull fracture or intracranial hemorrhage. 2. No cervical spine fracture or subluxation. 3. Stable mild-to-moderate diffuse cerebral and cerebellar atrophy. 4. Moderate small vessel white matter ischemic changes in both cerebral hemispheres with mild progression. 5. Multilevel cervical spine degenerative changes. 6. Dense bilateral carotid artery atheromatous calcifications. 7. Multinodular thyroid gland with a 1.2 cm heterogeneous nodule on the right. Consider further evaluation with elective thyroid ultrasound. If patient is clinically hyperthyroid, consider nuclear medicine thyroid uptake and scan. Electronically Signed   By: Claudie Revering M.D.   On: 02/17/2019 17:58   Mr Brain Wo Contrast  Result Date: 02/17/2019 CLINICAL DATA:  TIA. Recent fall. Cardiac catheterization  02/16/2019 EXAM: MRI HEAD WITHOUT CONTRAST TECHNIQUE: Multiplanar, multiecho pulse sequences of the brain and surrounding structures were obtained without intravenous contrast. COMPARISON:  CT headache 11/2018 FINDINGS: Brain: Limited study. The patient was moving and not able to complete the study. Small focus of acute infarct in the left posterior temporal lobe measuring 5 mm. No other acute infarct Moderate atrophy. Chronic ischemic changes in the white matter, basal ganglia, and pons bilaterally. Negative for hemorrhage or fluid collection. Vascular: Normal arterial flow voids. IMPRESSION: Motion degraded and incomplete study 5 mm acute infarct left posterior temporal lobe Moderate atrophy and moderate chronic ischemic changes. Electronically Signed   By: Franchot Gallo M.D.   On: 02/17/2019 21:26   Vas US Carotid (at [place] Only)  Result Date: 02/18/2019 Carotid Arterial Duplex Study Indications:       CVA and acute infarct left posterior temporal lobe. Risk Factors:       Hypertension, hyperlipidemia, Diabetes. Comparison Study:  11/18/16 1-39%ICA Performing Technologist: June Leap RDMS, RVT  Examination Guidelines: A complete evaluation includes B-mode imaging, spectral Doppler, color Doppler, and power Doppler as needed of all accessible portions of each vessel. Bilateral testing is considered an integral part of a complete examination. Limited examinations for reoccurring indications may be performed as noted.  Right Carotid Findings: +----------+-------+-------+--------+---------------------------------+--------+             PSV     EDV     Stenosis Describe                          Comments              cm/s    cm/s                                                         +----------+-------+-------+--------+---------------------------------+--------+  CCA Prox   49      9                                                            +----------+-------+-------+--------+---------------------------------+--------+  CCA Distal 47      9                heterogenous                                +----------+-------+-------+--------+---------------------------------+--------+  ICA Prox   160     38      40-59%   heterogenous, calcific and                                                       irregular                                   +----------+-------+-------+--------+---------------------------------+--------+  ICA Mid    154  19                                                           +----------+-------+-------+--------+---------------------------------+--------+  ICA Distal 114     21                                                           +----------+-------+-------+--------+---------------------------------+--------+  ECA        136     3                heterogenous and irregular                  +----------+-------+-------+--------+---------------------------------+--------+ +----------+--------+-------+----------------+-------------------+             PSV cm/s EDV  cms Describe         Arm Pressure (mmHG)  +----------+--------+-------+----------------+-------------------+  Subclavian 74               Multiphasic, WNL                      +----------+--------+-------+----------------+-------------------+ +---------+--------+--+--------+--+---------+  Vertebral PSV cm/s 53 EDV cm/s 12 Antegrade  +---------+--------+--+--------+--+---------+  Left Carotid Findings: +----------+--------+--------+--------+-------------------------+--------+             PSV cm/s EDV cm/s Stenosis Describe                  Comments  +----------+--------+--------+--------+-------------------------+--------+  CCA Prox   75       11                heterogenous                        +----------+--------+--------+--------+-------------------------+--------+  CCA Distal 55       12                                                    +----------+--------+--------+--------+-------------------------+--------+  ICA Prox   79       14       1-39%    heterogenous and calcific           +----------+--------+--------+--------+-------------------------+--------+  ICA Mid    83       15                                                    +----------+--------+--------+--------+-------------------------+--------+  ICA Distal 73       10                                                    +----------+--------+--------+--------+-------------------------+--------+  ECA        117  4                                                     +----------+--------+--------+--------+-------------------------+--------+ +----------+--------+--------+----------------+-------------------+  Subclavian PSV cm/s EDV cm/s Describe         Arm Pressure (mmHG)  +----------+--------+--------+----------------+-------------------+             96                Multiphasic, WNL                      +----------+--------+--------+----------------+-------------------+ +---------+--------+--+--------+--+---------+  Vertebral PSV cm/s 93 EDV  cm/s 18 Antegrade  +---------+--------+--+--------+--+---------+  Summary: Right Carotid: Velocities in the right ICA are consistent with a 40-59%                stenosis. Left Carotid: Velocities in the left ICA are consistent with a 1-39% stenosis.  *See table(s) above for measurements and observations.     Preliminary         Scheduled Meds:  aspirin  300 mg Rectal Daily   Or   aspirin  325 mg Oral Daily   atorvastatin  20 mg Oral Daily   enoxaparin (LOVENOX) injection  30 mg Subcutaneous Q24H   hydrALAZINE  50 mg Oral Q8H   insulin aspart  0-9 Units Subcutaneous TID WC   metoprolol succinate  100 mg Oral Daily   PARoxetine  40 mg Oral QHS   sodium chloride  1 g Oral TID WC   Continuous Infusions:  sodium chloride       LOS: 1 day     Cordelia Poche, MD [facility] 02/19/2019, 5:04 PM  If 7PM-7AM, please contact night-coverage [email or web address]

## 2019-02-19 NOTE — TOC Initial Note (Signed)
Transition of Care [facility]) - Initial/Assessment Note    Patient Details  Name: Stephanie [name] MRN: [id number] Date of Birth: 1933/09/09  Transition of Care [facility]) CM/SW Contact:    Pollie Friar, RN Phone Number: 02/19/2019, 12:22 PM  Clinical Narrative:                 Pt with recommendations for [facility] services. CM met with the patient and she has used [facility] in the past and asked to use them again.  Pt denies any issues with her home medications and states spouse provides transportation.  TOC following.  Expected Discharge Plan: [place] Barriers to Discharge: Continued Medical Work up   Patient Goals and CMS Choice   CMS Medicare.gov Compare Post Acute Care list provided to:: Patient Choice offered to / list presented to : Patient  Expected Discharge Plan and Services Expected Discharge Plan: [place]   Discharge Planning Services: CM Consult Post Acute Care Choice: [place] arrangements for the past 2 months: [place]: [place] ([place])        Prior Living Arrangements/Services Living arrangements for the past 2 months: Single Family Home Lives with:: Spouse Patient language and need for interpreter reviewed:: Yes(no needs) Do you feel safe going back to the place where you live?: Yes      Need for Family Participation in Patient Care: No (Comment) Care giver support system in place?: Yes (comment)(spouse) Current home services: DME(3 in 1, cane, walker, shower seat, wheelchair) Criminal Activity/Legal Involvement Pertinent to Current Situation/Hospitalization: No - Comment as needed  Activities of Daily Living Home Assistive Devices/Equipment: Gilford Rile (specify type) ADL Screening (condition at time of admission) Patient's cognitive ability adequate to safely complete daily activities?: Yes Is the patient deaf or have difficulty hearing?: No Does the  patient have difficulty seeing, even when wearing glasses/contacts?: No Does the patient have difficulty concentrating, remembering, or making decisions?: No Patient able to express need for assistance with ADLs?: Yes Does the patient have difficulty dressing or bathing?: No Independently performs ADLs?: Yes (appropriate for developmental age) Does the patient have difficulty walking or climbing stairs?: No Weakness of Legs: None Weakness of Arms/Hands: None  Permission Sought/Granted                  Emotional Assessment Appearance:: Appears stated age Attitude/Demeanor/Rapport: Engaged Affect (typically observed): Accepting, Pleasant Orientation: : Oriented to Self, Oriented to Place, Oriented to  Time, Oriented to Situation   Psych Involvement: No (comment)  Admission diagnosis:  Cerebrovascular accident (CVA), unspecified mechanism ([place]) [I63.9] Patient Active Problem List   Diagnosis Date Noted  . Demand ischemia ([place]) 02/17/2019  . Chest pain 02/17/2019  . Elevated troponin 02/17/2019  . Acute CVA (cerebrovascular accident) ([place]) 02/17/2019  . Non-ST elevation (NSTEMI) myocardial infarction ([place]) 02/16/2019  . NSTEMI (non-ST elevated myocardial infarction) ([place])   . SVT (supraventricular tachycardia) ([place]) 02/15/2019  . Colonic diverticular abscess 06/05/2018  . Anemia 06/05/2018  . Protein calorie malnutrition ([place]) 06/05/2018  . Fecal occult blood test positive 03/12/2018  . Closed fracture of left hip ([place])   . Chronic kidney disease (CKD), stage II (mild) 01/22/2018  . Fracture of femoral neck, left ([place]) 01/21/2018  . Depression 01/21/2018  . HLD (hyperlipidemia) 01/21/2018  .  GERD (gastroesophageal reflux disease) 01/21/2018  . CAD (coronary artery disease) 01/21/2018  . Subcapital fracture of left hip ([place]) 01/21/2018  . Carotid stenosis, bilateral 11/20/2016  . Transient cerebral ischemia 11/06/2016  . Vitamin D deficiency 09/04/2015  . Routine general  medical examination at a health care facility 09/04/2015  . Encounter for Medicare annual wellness exam 05/30/2014  . Colon cancer screening 05/30/2014  . History of fall 05/26/2013  . Gynecological examination 02/20/2011  . HELICOBACTER PYLORI INFECTION 12/19/2006  . HSV 12/19/2006  . Type 2 diabetes mellitus ([place]) 12/19/2006  . Diabetic retinopathy ([place]) 12/19/2006  . Combined hyperlipidemia associated with type 2 diabetes mellitus ([place]) 12/19/2006  . ANXIETY 12/19/2006  . HTN (hypertension) 12/19/2006  . Coronary atherosclerosis 12/19/2006  . Peripheral vascular disease due to secondary diabetes mellitus ([place]) 12/19/2006  . ALLERGIC RHINITIS 12/19/2006  . OSTEOARTHRITIS 12/19/2006  . Osteopenia 12/19/2006   PCP:  Abner Greenspan, MD Pharmacy:   [facility] [address], [place] - [place] [address] [place] [address] Phone: [phone] Fax: [place], [place] [address] [place] [address] Phone: [phone] Fax: [phone]     Social Determinants of Health (SDOH) Interventions    Readmission Risk Interventions No flowsheet data found.

## 2019-02-19 NOTE — Plan of Care (Signed)
  Problem: Education: Goal: Knowledge of disease or condition will improve Outcome: Progressing Goal: Knowledge of patient specific risk factors addressed and post discharge goals established will improve Outcome: Progressing   

## 2019-02-20 LAB — BASIC METABOLIC PANEL
Anion gap: 11 (ref 5–15)
BUN: 21 mg/dL (ref 8–23)
CO2: 20 mmol/L — ABNORMAL LOW (ref 22–32)
Calcium: 8.6 mg/dL — ABNORMAL LOW (ref 8.9–10.3)
Chloride: 97 mmol/L — ABNORMAL LOW (ref 98–111)
Creatinine, Ser: 0.97 mg/dL (ref 0.44–1.00)
GFR calc Af Amer: >60 mL/min (ref >60)
GFR calc non Af Amer: 54 mL/min — ABNORMAL LOW (ref >60)
Glucose, Bld: 188 mg/dL — ABNORMAL HIGH (ref 70–99)
Potassium: 3.3 mmol/L — ABNORMAL LOW (ref 3.5–5.1)
Sodium: 128 mmol/L — ABNORMAL LOW (ref 135–145)

## 2019-02-20 LAB — GLUCOSE, CAPILLARY
Glucose-Capillary: 215 mg/dL — ABNORMAL HIGH (ref 70–99)
Glucose-Capillary: 206 mg/dL — ABNORMAL HIGH (ref 70–99)

## 2019-02-20 MED ORDER — POTASSIUM CHLORIDE CRYS ER 20 MEQ PO TBCR
40.0000 meq | EXTENDED_RELEASE_TABLET | Freq: Once | ORAL | Status: AC
  Administered 2019-02-20: 40 meq via ORAL
  Filled 2019-02-20: qty 2

## 2019-02-20 NOTE — Discharge Summary (Signed)
Physician Discharge Summary  [name]:[id number] DOB: Dec 22, 1933 DOA: 02/17/2019  PCP: Abner Greenspan, MD  Admit date: 02/17/2019 Discharge date: 02/20/2019  Admitted From: Home Disposition: Home  Recommendations for Outpatient Follow-up:  1. Follow up with PCP in 1 week 2. Please obtain BMP in two days to recheck sodium 3. Please follow up on the following pending results: None  Home Health: PT Equipment/Devices: None  Discharge Condition: Stable CODE STATUS: Full code Diet recommendation: Heart healthy   Brief/Interim Summary:  Admission HPI written by [name], MD   Chief Complaint: Confusion.  HPI: Stephanie [name] is a 85 y.o. female with history of diabetes mellitus, CAD status post stenting, hypertension, chronic anemia who was discharged yesterday morning after cardiac cath was found to be confused by her husband.  Patient was admitted and discharged yesterday after being found to be in SVT following which patient had demand ischemia cardiac cath was unremarkable.  After reaching home patient had a fall and when patient husband checked on her she was on the floor lying confused and EMS was called.  EMS found the patient had left facial droop.  Was brought to the ER.  ED Course: In the ER patient left facial droop largely resolved CT head and C-spine did not show anything acute per MRI brain shows left temporal infarct.  On-call neurology was consulted and admitted for further stroke work-up.  Lab work show sodium 130 bicarb 20 creatinine 0.9 hemoglobin 11.8 platelets 249 EKG normal sinus rhythm COVID-19 test is pending.   Hospital course:  Acute CVA Incidental in setting of recent heart catheterization. Likely not contributory to presentation. Home health PT, neurology follow-up outpatient. Discharge on Lipitor and continued diabetes management.  Hyponatremia Acute drop. Possibly mediated by chlorthalidone but possible some hypovolemia playing a  role. Also on Paxil, which could also be playing a role. Patient appears euvolemic and not hypervolemic at this time. Treated with IV fluids and salt tablets with improvement. This was discontinued secondary to history of mild systolic heart failure. Will need close follow-up and she states she has an appointment in two days. Reasons for return discussed with patient and husband.  Essential hypertension Stable. Continue hydralazine, metoprolol. Discontinue chlorthalidone  Diabetes mellitus, type 2 SSI while inpatient. Continue home metformin and glipizide  Chronic anemia Hemoglobin stable  Discharge Diagnoses:  Principal Problem:   Acute CVA (cerebrovascular accident) ([place]) Active Problems:   Type 2 diabetes mellitus (HCC)   HTN (hypertension)   HLD (hyperlipidemia)   CAD (coronary artery disease)   Chronic kidney disease (CKD), stage II (mild)   SVT (supraventricular tachycardia) [facility])    Discharge Instructions  Discharge Instructions    Increase activity slowly   Complete by: As directed      Allergies as of 02/20/2019      Reactions   Ace Inhibitors Other (See Comments)   Chest pain   Amlodipine Besy-benazepril Hcl Other (See Comments)   Chest pain   Clopidogrel Bisulfate Other (See Comments)   GI upset   Erythromycin Other (See Comments)   GI upset   Tape Other (See Comments)   Causes soreness (of the skin)      Medication List    STOP taking these medications   chlorthalidone 25 MG tablet Commonly known as: HYGROTON     TAKE these medications   atorvastatin 20 MG tablet Commonly known as: LIPITOR Take 1 tablet (20 mg total) by mouth daily.   dipyridamole-aspirin 200-25  MG 12hr capsule Commonly known as: AGGRENOX Take 1 capsule by mouth twice daily   famotidine 40 MG tablet Commonly known as: PEPCID Take 1 tablet (40 mg total) by mouth daily. What changed:   when to take this  additional instructions   glipiZIDE 10 MG 24 hr tablet Commonly  known as: glipiZIDE XL TAKE 1 TABLET BY MOUTH ONCE DAILY WITH BREAKFAST What changed:   how much to take  how to take this  when to take this  additional instructions   hydrALAZINE 50 MG tablet Commonly known as: APRESOLINE Take 1 tablet (50 mg total) by mouth every 8 (eight) hours.   metFORMIN 1000 MG tablet Commonly known as: GLUCOPHAGE Take 1 tablet (1,000 mg total) by mouth 2 (two) times daily with a meal.   metoprolol succinate 100 MG 24 hr tablet Commonly known as: TOPROL-XL TAKE 1 TABLET BY MOUTH ONCE DAILY TAKE  WITH  OR  IMMEDIATELY  FOLLOWING  A  MEAL   nitroGLYCERIN 0.4 MG SL tablet Commonly known as: NITROSTAT Place 1 tablet (0.4 mg total) under the tongue every 5 (five) minutes x 3 doses as needed for chest pain.   PARoxetine 40 MG tablet Commonly known as: PAXIL Take 1 tablet by mouth daily What changed: when to take this      Follow-up Information    Tower, Wynelle Fanny, MD. Schedule an appointment as soon as possible for a visit in 1 week(s).   Specialties: Family Medicine, Radiology Why: Hospital follow-up. Lab work. Contact information: [place] [address] [phone]        Garvin Fila, MD. Schedule an appointment as soon as possible for a visit in 1 week(s).   Specialties: Neurology, Radiology Why: Stroke follow-up Contact information: [address] [place] [address] [phone]          Allergies  Allergen Reactions   Ace Inhibitors Other (See Comments)    Chest pain   Amlodipine Besy-Benazepril Hcl Other (See Comments)    Chest pain   Clopidogrel Bisulfate Other (See Comments)    GI upset   Erythromycin Other (See Comments)    GI upset   Tape Other (See Comments)    Causes soreness (of the skin)    Consultations:  Neurology   Procedures/Studies: Dg Chest 2 View  Result Date: 02/15/2019 CLINICAL DATA:  Chest pain with SVT EXAM: CHEST - 2 VIEW COMPARISON:  06/04/2018,  11/11/2017, 09/25/2009 FINDINGS: Left lower calcifications. No acute airspace disease or effusion mild asymmetric opacity in the left apex. Stable cardiomediastinal silhouette with aortic atherosclerosis. No pneumothorax. Stable ovoid calcification in the left upper quadrant. IMPRESSION: No active cardiopulmonary disease. Summation shadow versus nodule in the left lung apex. Further evaluation with chest CT could be obtained. Electronically Signed   By: Donavan Foil M.D.   On: 02/15/2019 16:57   Ct Head Wo Contrast  Result Date: 02/17/2019 CLINICAL DATA:  Altered mental status following a fall today. EXAM: CT HEAD WITHOUT CONTRAST CT CERVICAL SPINE WITHOUT CONTRAST TECHNIQUE: Multidetector CT imaging of the head and cervical spine was performed following the standard protocol without intravenous contrast. Multiplanar CT image reconstructions of the cervical spine were also generated. COMPARISON:  Head and cervical spine CT report dated 01/21/2018 and head CT dated 03/28/2017. FINDINGS: CT HEAD FINDINGS Brain: No significant change in mild-to-moderate enlargement of the ventricles and subarachnoid spaces. Moderate patchy white matter low density in both cerebral hemispheres with mild progression. No intracranial  hemorrhage, mass lesion or CT evidence of acute infarction. Vascular: No hyperdense vessel or unexpected calcification. Skull: Normal. Negative for fracture or focal lesion. Sinuses/Orbits: Unremarkable. Other: None. CT CERVICAL SPINE FINDINGS Alignment: Mild dextroconvex cervicothoracic scoliosis. No subluxations. Skull base and vertebrae: No acute fracture. No primary bone lesion or focal pathologic process. Soft tissues and spinal canal: No prevertebral fluid or swelling. No visible canal hematoma. Disc levels: Multilevel degenerative changes, most pronounced at the C5-6 and C6-7 levels. Upper chest: Minimal biapical pleural and parenchymal scarring. Other: 1.2 cm right lobe thyroid nodule an  additional smaller poorly defined nodules. Dense bilateral carotid artery calcifications. IMPRESSION: 1. No skull fracture or intracranial hemorrhage. 2. No cervical spine fracture or subluxation. 3. Stable mild-to-moderate diffuse cerebral and cerebellar atrophy. 4. Moderate small vessel white matter ischemic changes in both cerebral hemispheres with mild progression. 5. Multilevel cervical spine degenerative changes. 6. Dense bilateral carotid artery atheromatous calcifications. 7. Multinodular thyroid gland with a 1.2 cm heterogeneous nodule on the right. Consider further evaluation with elective thyroid ultrasound. If patient is clinically hyperthyroid, consider nuclear medicine thyroid uptake and scan. Electronically Signed   By: Claudie Revering M.D.   On: 02/17/2019 17:58   Ct Cervical Spine Wo Contrast  Result Date: 02/17/2019 CLINICAL DATA:  Altered mental status following a fall today. EXAM: CT HEAD WITHOUT CONTRAST CT CERVICAL SPINE WITHOUT CONTRAST TECHNIQUE: Multidetector CT imaging of the head and cervical spine was performed following the standard protocol without intravenous contrast. Multiplanar CT image reconstructions of the cervical spine were also generated. COMPARISON:  Head and cervical spine CT report dated 01/21/2018 and head CT dated 03/28/2017. FINDINGS: CT HEAD FINDINGS Brain: No significant change in mild-to-moderate enlargement of the ventricles and subarachnoid spaces. Moderate patchy white matter low density in both cerebral hemispheres with mild progression. No intracranial hemorrhage, mass lesion or CT evidence of acute infarction. Vascular: No hyperdense vessel or unexpected calcification. Skull: Normal. Negative for fracture or focal lesion. Sinuses/Orbits: Unremarkable. Other: None. CT CERVICAL SPINE FINDINGS Alignment: Mild dextroconvex cervicothoracic scoliosis. No subluxations. Skull base and vertebrae: No acute fracture. No primary bone lesion or focal pathologic process. Soft  tissues and spinal canal: No prevertebral fluid or swelling. No visible canal hematoma. Disc levels: Multilevel degenerative changes, most pronounced at the C5-6 and C6-7 levels. Upper chest: Minimal biapical pleural and parenchymal scarring. Other: 1.2 cm right lobe thyroid nodule an additional smaller poorly defined nodules. Dense bilateral carotid artery calcifications. IMPRESSION: 1. No skull fracture or intracranial hemorrhage. 2. No cervical spine fracture or subluxation. 3. Stable mild-to-moderate diffuse cerebral and cerebellar atrophy. 4. Moderate small vessel white matter ischemic changes in both cerebral hemispheres with mild progression. 5. Multilevel cervical spine degenerative changes. 6. Dense bilateral carotid artery atheromatous calcifications. 7. Multinodular thyroid gland with a 1.2 cm heterogeneous nodule on the right. Consider further evaluation with elective thyroid ultrasound. If patient is clinically hyperthyroid, consider nuclear medicine thyroid uptake and scan. Electronically Signed   By: Claudie Revering M.D.   On: 02/17/2019 17:58   Mr Brain Wo Contrast  Result Date: 02/17/2019 CLINICAL DATA:  TIA. Recent fall. Cardiac catheterization 02/16/2019 EXAM: MRI HEAD WITHOUT CONTRAST TECHNIQUE: Multiplanar, multiecho pulse sequences of the brain and surrounding structures were obtained without intravenous contrast. COMPARISON:  CT headache 11/2018 FINDINGS: Brain: Limited study. The patient was moving and not able to complete the study. Small focus of acute infarct in the left posterior temporal lobe measuring 5 mm. No other acute infarct Moderate atrophy. Chronic ischemic  changes in the white matter, basal ganglia, and pons bilaterally. Negative for hemorrhage or fluid collection. Vascular: Normal arterial flow voids. IMPRESSION: Motion degraded and incomplete study 5 mm acute infarct left posterior temporal lobe Moderate atrophy and moderate chronic ischemic changes. Electronically Signed    By: Franchot Gallo M.D.   On: 02/17/2019 21:26   Vas US Carotid (at [facility] Only)  Result Date: 02/18/2019 Carotid Arterial Duplex Study Indications:       CVA and acute infarct left posterior temporal lobe. Risk Factors:      Hypertension, hyperlipidemia, Diabetes. Comparison Study:  11/18/16 1-39%ICA Performing Technologist: June Leap RDMS, RVT  Examination Guidelines: A complete evaluation includes B-mode imaging, spectral Doppler, color Doppler, and power Doppler as needed of all accessible portions of each vessel. Bilateral testing is considered an integral part of a complete examination. Limited examinations for reoccurring indications may be performed as noted.  Right Carotid Findings: +----------+-------+-------+--------+---------------------------------+--------+             PSV     EDV     Stenosis Describe                          Comments              cm/s    cm/s                                                         +----------+-------+-------+--------+---------------------------------+--------+  CCA Prox   49      9                                                            +----------+-------+-------+--------+---------------------------------+--------+  CCA Distal 47      9                heterogenous                                +----------+-------+-------+--------+---------------------------------+--------+  ICA Prox   160     38      40-59%   heterogenous, calcific and                                                       irregular                                   +----------+-------+-------+--------+---------------------------------+--------+  ICA Mid    154     19                                                           +----------+-------+-------+--------+---------------------------------+--------+  ICA Distal 114     21                                                           +----------+-------+-------+--------+---------------------------------+--------+  ECA        136     3                 heterogenous and irregular                  +----------+-------+-------+--------+---------------------------------+--------+ +----------+--------+-------+----------------+-------------------+             PSV cm/s EDV cms Describe         Arm Pressure (mmHG)  +----------+--------+-------+----------------+-------------------+  Subclavian 74               Multiphasic, WNL                      +----------+--------+-------+----------------+-------------------+ +---------+--------+--+--------+--+---------+  Vertebral PSV cm/s 53 EDV cm/s 12 Antegrade  +---------+--------+--+--------+--+---------+  Left Carotid Findings: +----------+--------+--------+--------+-------------------------+--------+             PSV cm/s EDV cm/s Stenosis Describe                  Comments  +----------+--------+--------+--------+-------------------------+--------+  CCA Prox   75       11                heterogenous                        +----------+--------+--------+--------+-------------------------+--------+  CCA Distal 55       12                                                    +----------+--------+--------+--------+-------------------------+--------+  ICA Prox   79       14       1-39%    heterogenous and calcific           +----------+--------+--------+--------+-------------------------+--------+  ICA Mid    83       15                                                    +----------+--------+--------+--------+-------------------------+--------+  ICA Distal 73       10                                                    +----------+--------+--------+--------+-------------------------+--------+  ECA        117      4                                                     +----------+--------+--------+--------+-------------------------+--------+ +----------+--------+--------+----------------+-------------------+  Subclavian  PSV cm/s EDV cm/s Describe         Arm Pressure (mmHG)   +----------+--------+--------+----------------+-------------------+             96                Multiphasic, WNL                      +----------+--------+--------+----------------+-------------------+ +---------+--------+--+--------+--+---------+  Vertebral PSV cm/s 93 EDV cm/s 18 Antegrade  +---------+--------+--+--------+--+---------+  Summary: Right Carotid: Velocities in the right ICA are consistent with a 40-59%                stenosis. Left Carotid: Velocities in the left ICA are consistent with a 1-39% stenosis.  *See table(s) above for measurements and observations.     Preliminary        Subjective: No issues today  Discharge Exam: Vitals:   02/20/19 0900 02/20/19 1246  BP: (!) 148/50 (!) 127/46  Pulse: 81 75  Resp: 17 18  Temp: 98.1 F (36.7 C) 97.8 F (36.6 C)  SpO2: 98% 100%   Vitals:   02/20/19 0349 02/20/19 0500 02/20/19 0900 02/20/19 1246  BP: (!) 113/94  (!) 148/50 (!) 127/46  Pulse: 83  81 75  Resp: 16  17 18   Temp: 98.9 F (37.2 C)  98.1 F (36.7 C) 97.8 F (36.6 C)  TempSrc: Oral  Axillary Oral  SpO2: 99%  98% 100%  Weight:  48.3 kg    Height:        General: Pt is alert, awake, not in acute distress Cardiovascular: RRR, S1/S2 +, no rubs, no gallops Respiratory: CTA bilaterally, no wheezing, no rhonchi Abdominal: Soft, NT, ND, bowel sounds + Extremities: no edema, no cyanosis    The results of significant diagnostics from this hospitalization (including imaging, microbiology, ancillary and laboratory) are listed below for reference.     Microbiology: Recent Results (from the past 240 hour(s))  SARS Coronavirus 2 [facility] order, Performed in [facility] hospital lab) Nasopharyngeal Nasopharyngeal Swab     Status: None   Collection Time: 02/15/19  5:53 PM   Specimen: Nasopharyngeal Swab  Result Value Ref Range Status   SARS Coronavirus 2 NEGATIVE NEGATIVE Final    Comment: (NOTE) If result is NEGATIVE SARS-CoV-2 target nucleic acids are NOT  DETECTED. The SARS-CoV-2 RNA is generally detectable in upper and lower  respiratory specimens during the acute phase of infection. The lowest  concentration of SARS-CoV-2 viral copies this assay can detect is 250  copies / mL. A negative result does not preclude SARS-CoV-2 infection  and should not be used as the sole basis for treatment or other  patient management decisions.  A negative result may occur with  improper specimen collection / handling, submission of specimen other  than nasopharyngeal swab, presence of viral mutation(s) within the  areas targeted by this assay, and inadequate number of viral copies  (<250 copies / mL). A negative result must be combined with clinical  observations, patient history, and epidemiological information. If result is POSITIVE SARS-CoV-2 target nucleic acids are DETECTED. The SARS-CoV-2 RNA is generally detectable in upper and lower  respiratory specimens dur ing the acute phase of infection.  Positive  results are indicative of active infection with SARS-CoV-2.  Clinical  correlation with patient history and other diagnostic information is  necessary to determine patient infection status.  Positive results do  not rule out bacterial infection or co-infection with other viruses. If result is  PRESUMPTIVE POSTIVE SARS-CoV-2 nucleic acids MAY BE PRESENT.   A presumptive positive result was obtained on the submitted specimen  and confirmed on repeat testing.  While 2019 novel coronavirus  (SARS-CoV-2) nucleic acids may be present in the submitted sample  additional confirmatory testing may be necessary for epidemiological  and / or clinical management purposes  to differentiate between  SARS-CoV-2 and other Sarbecovirus currently known to infect humans.  If clinically indicated additional testing with an alternate test  methodology [phone]) is advised. The SARS-CoV-2 RNA is generally  detectable in upper and lower respiratory sp ecimens during  the acute  phase of infection. The expected result is Negative. Fact Sheet for Patients:  StrictlyIdeas.no Fact Sheet for Healthcare Providers: BankingDealers.co.za This test is not yet approved or cleared by the [place] FDA and has been authorized for detection and/or diagnosis of SARS-CoV-2 by FDA under an Emergency Use Authorization (EUA).  This EUA will remain in effect (meaning this test can be used) for the duration of the COVID-19 declaration under Section 564(b)(1) of the Act, 21 U.S.C. section 360bbb-3(b)(1), unless the authorization is terminated or revoked sooner. Performed at [facility], [place] [address], , [place] [address]   MRSA PCR Screening     Status: None   Collection Time: 02/15/19  8:36 PM   Specimen: Nasal Mucosa; Nasopharyngeal  Result Value Ref Range Status   MRSA by PCR NEGATIVE NEGATIVE Final    Comment:        The GeneXpert MRSA Assay (FDA approved for NASAL specimens only), is one component of a comprehensive MRSA colonization surveillance program. It is not intended to diagnose MRSA infection nor to guide or monitor treatment for MRSA infections. Performed at [facility], [place] [address], [place], [place] [address]   SARS CORONAVIRUS 2 Nasal Swab Aptima Multi Swab     Status: None   Collection Time: 02/17/19 10:25 PM   Specimen: Aptima Multi Swab; Nasal Swab  Result Value Ref Range Status   SARS Coronavirus 2 NEGATIVE NEGATIVE Final    Comment: (NOTE) SARS-CoV-2 target nucleic acids are NOT DETECTED. The SARS-CoV-2 RNA is generally detectable in upper and lower respiratory specimens during the acute phase of infection. Negative results do not preclude SARS-CoV-2 infection, do not rule out co-infections with other pathogens, and should not be used as the sole basis for treatment or other patient management decisions. Negative results must be combined with clinical  observations, patient history, and epidemiological information. The expected result is Negative. Fact Sheet for Patients: SugarRoll.be Fact Sheet for Healthcare Providers: https://www.woods-mathews.com/ This test is not yet approved or cleared by the [place] FDA and  has been authorized for detection and/or diagnosis of SARS-CoV-2 by FDA under an Emergency Use Authorization (EUA). This EUA will remain  in effect (meaning this test can be used) for the duration of the COVID-19 declaration under Section 56 4(b)(1) of the Act, 21 U.S.C. section 360bbb-3(b)(1), unless the authorization is terminated or revoked sooner. Performed at [facility], [place] [address], [place], [place] [address]      Labs: BNP (last 3 results) No results for input(s): BNP in the last 8760 hours. Basic Metabolic Panel: Recent Labs  Lab 02/15/19 1801  02/18/19 1634 02/19/19 0656 02/19/19 1329 02/19/19 1930 02/20/19 0754  NA  --    < > 129* 125* 127* 127* 128*  K  --    < > 3.7 3.7 3.7 3.7 3.3*  CL  --    < >  94* 92* 97* 96* 97*  CO2  --    < > 25 18* 19* 20* 20*  GLUCOSE  --    < > 79 157* 241* 168* 188*  BUN  --    < > 20 27* 28* 28* 21  CREATININE  --    < > 1.09* 1.20* 1.40* 1.50* 0.97  CALCIUM  --    < > 9.1 8.9 8.2* 8.2* 8.6*  MG 1.6*  --   --   --   --   --   --    < > = values in this interval not displayed.   Liver Function Tests: Recent Labs  Lab 02/17/19 1535 02/18/19 0420  AST 24 22  ALT 11 11  ALKPHOS 52 45  BILITOT 0.8 0.5  PROT 6.7 6.2*  ALBUMIN 3.7 3.5   No results for input(s): LIPASE, AMYLASE in the last 168 hours. No results for input(s): AMMONIA in the last 168 hours. CBC: Recent Labs  Lab 02/15/19 1601 02/16/19 0219 02/17/19 0236 02/17/19 1535 02/17/19 1539 02/18/19 0420  WBC 7.9 10.3 10.8* 10.5  --  10.2  NEUTROABS  --   --   --  6.6  --   --   HGB 11.0* 11.1* 10.9* 11.8* 12.2 10.7*  HCT 32.9* 31.9* 32.3*  34.9* 36.0 30.7*  MCV 94.0 91.4 92.0 92.1  --  90.6  PLT 224 226 246 249  --  235   Cardiac Enzymes: No results for input(s): CKTOTAL, CKMB, CKMBINDEX, TROPONINI in the last 168 hours. BNP: Invalid input(s): POCBNP CBG: Recent Labs  Lab 02/19/19 1145 02/19/19 1638 02/19/19 2123 02/20/19 0703 02/20/19 1200  GLUCAP 236* 101* 153* 215* 206*   D-Dimer No results for input(s): DDIMER in the last 72 hours. Hgb A1c Recent Labs    02/17/19 1535  HGBA1C 6.4*   Lipid Profile Recent Labs    02/18/19 0420  CHOL 115  HDL 35*  LDLCALC 50  TRIG 149  CHOLHDL 3.3   Thyroid function studies No results for input(s): TSH, T4TOTAL, T3FREE, THYROIDAB in the last 72 hours.  Invalid input(s): FREET3 Anemia work up No results for input(s): VITAMINB12, FOLATE, FERRITIN, TIBC, IRON, RETICCTPCT in the last 72 hours. Urinalysis    Component Value Date/Time   COLORURINE YELLOW 02/17/2019 1817   APPEARANCEUR HAZY (A) 02/17/2019 1817   LABSPEC 1.017 02/17/2019 1817   PHURINE 6.0 02/17/2019 1817   GLUCOSEU NEGATIVE 02/17/2019 1817   HGBUR NEGATIVE 02/17/2019 1817   BILIRUBINUR NEGATIVE 02/17/2019 1817   KETONESUR 5 (A) 02/17/2019 1817   PROTEINUR 100 (A) 02/17/2019 1817   NITRITE NEGATIVE 02/17/2019 1817   LEUKOCYTESUR NEGATIVE 02/17/2019 1817   Sepsis Labs Invalid input(s): PROCALCITONIN,  WBC,  LACTICIDVEN Microbiology Recent Results (from the past 240 hour(s))  SARS Coronavirus 2 [facility] order, Performed in [facility] hospital lab) Nasopharyngeal Nasopharyngeal Swab     Status: None   Collection Time: 02/15/19  5:53 PM   Specimen: Nasopharyngeal Swab  Result Value Ref Range Status   SARS Coronavirus 2 NEGATIVE NEGATIVE Final    Comment: (NOTE) If result is NEGATIVE SARS-CoV-2 target nucleic acids are NOT DETECTED. The SARS-CoV-2 RNA is generally detectable in upper and lower  respiratory specimens during the acute phase of infection. The lowest  concentration of SARS-CoV-2  viral copies this assay can detect is 250  copies / mL. A negative result does not preclude SARS-CoV-2 infection  and should not be used as the sole basis for treatment  or other  patient management decisions.  A negative result may occur with  improper specimen collection / handling, submission of specimen other  than nasopharyngeal swab, presence of viral mutation(s) within the  areas targeted by this assay, and inadequate number of viral copies  (<250 copies / mL). A negative result must be combined with clinical  observations, patient history, and epidemiological information. If result is POSITIVE SARS-CoV-2 target nucleic acids are DETECTED. The SARS-CoV-2 RNA is generally detectable in upper and lower  respiratory specimens dur ing the acute phase of infection.  Positive  results are indicative of active infection with SARS-CoV-2.  Clinical  correlation with patient history and other diagnostic information is  necessary to determine patient infection status.  Positive results do  not rule out bacterial infection or co-infection with other viruses. If result is PRESUMPTIVE POSTIVE SARS-CoV-2 nucleic acids MAY BE PRESENT.   A presumptive positive result was obtained on the submitted specimen  and confirmed on repeat testing.  While 2019 novel coronavirus  (SARS-CoV-2) nucleic acids may be present in the submitted sample  additional confirmatory testing may be necessary for epidemiological  and / or clinical management purposes  to differentiate between  SARS-CoV-2 and other Sarbecovirus currently known to infect humans.  If clinically indicated additional testing with an alternate test  methodology [phone]) is advised. The SARS-CoV-2 RNA is generally  detectable in upper and lower respiratory sp ecimens during the acute  phase of infection. The expected result is Negative. Fact Sheet for Patients:  StrictlyIdeas.no Fact Sheet for Healthcare  Providers: BankingDealers.co.za This test is not yet approved or cleared by the [place] FDA and has been authorized for detection and/or diagnosis of SARS-CoV-2 by FDA under an Emergency Use Authorization (EUA).  This EUA will remain in effect (meaning this test can be used) for the duration of the COVID-19 declaration under Section 564(b)(1) of the Act, 21 U.S.C. section 360bbb-3(b)(1), unless the authorization is terminated or revoked sooner. Performed at [facility], [place] [address] [place], [place] [address]   MRSA PCR Screening     Status: None   Collection Time: 02/15/19  8:36 PM   Specimen: Nasal Mucosa; Nasopharyngeal  Result Value Ref Range Status   MRSA by PCR NEGATIVE NEGATIVE Final    Comment:        The GeneXpert MRSA Assay (FDA approved for NASAL specimens only), is one component of a comprehensive MRSA colonization surveillance program. It is not intended to diagnose MRSA infection nor to guide or monitor treatment for MRSA infections. Performed at [facility], [place] [address], [place], [place] [address]   SARS CORONAVIRUS 2 Nasal Swab Aptima Multi Swab     Status: None   Collection Time: 02/17/19 10:25 PM   Specimen: Aptima Multi Swab; Nasal Swab  Result Value Ref Range Status   SARS Coronavirus 2 NEGATIVE NEGATIVE Final    Comment: (NOTE) SARS-CoV-2 target nucleic acids are NOT DETECTED. The SARS-CoV-2 RNA is generally detectable in upper and lower respiratory specimens during the acute phase of infection. Negative results do not preclude SARS-CoV-2 infection, do not rule out co-infections with other pathogens, and should not be used as the sole basis for treatment or other patient management decisions. Negative results must be combined with clinical observations, patient history, and epidemiological information. The expected result is Negative. Fact Sheet for  Patients: SugarRoll.be Fact Sheet for Healthcare Providers: https://www.woods-mathews.com/ This test is not yet approved or cleared by the [place] FDA  and  has been authorized for detection and/or diagnosis of SARS-CoV-2 by FDA under an Emergency Use Authorization (EUA). This EUA will remain  in effect (meaning this test can be used) for the duration of the COVID-19 declaration under Section 56 4(b)(1) of the Act, 21 U.S.C. section 360bbb-3(b)(1), unless the authorization is terminated or revoked sooner. Performed at [facility], [place] [address], [place], [place] [address]      Time coordinating discharge: 35 minutes  SIGNED:   Cordelia Poche, MD [facility] 02/20/2019, 2:10 PM

## 2019-02-20 NOTE — Discharge Instructions (Signed)
Stephanie [name],  You were in the hospital because of confusion. This may have been because of your sodium. You were found to have a small stroke which may have been secondary to your recent heart catheterization. You also had low sodium. Please discontinue your chlorthalidone and recheck your labs on Monday (02/22/2019) at your PCP's office.

## 2019-02-20 NOTE — Plan of Care (Signed)
  Problem: Education: Goal: Knowledge of disease or condition will improve 02/20/2019 1714 by Myriam Forehand, RN Outcome: Adequate for Discharge 02/20/2019 1713 by Myriam Forehand, RN Outcome: Adequate for Discharge Goal: Knowledge of secondary prevention will improve 02/20/2019 1714 by Myriam Forehand, RN Outcome: Adequate for Discharge 02/20/2019 1713 by Myriam Forehand, RN Outcome: Adequate for Discharge Goal: Knowledge of patient specific risk factors addressed and post discharge goals established will improve 02/20/2019 1714 by Myriam Forehand, RN Outcome: Adequate for Discharge 02/20/2019 1713 by Myriam Forehand, RN Outcome: Adequate for Discharge   Problem: Acute Rehab PT Goals(only PT should resolve) Goal: Pt Will Go Supine/Side To Sit Outcome: Adequate for Discharge Goal: Patient Will Transfer Sit To/From Stand Outcome: Adequate for Discharge Goal: Pt Will Transfer Bed To Chair/Chair To Bed Outcome: Adequate for Discharge Goal: Pt Will Ambulate Outcome: Adequate for Discharge Goal: Pt Will Go Up/Down Stairs Outcome: Adequate for Discharge   Problem: Acute Rehab OT Goals (only OT should resolve) Goal: Pt. Will Perform Grooming Outcome: Adequate for Discharge Goal: Pt. Will Perform Lower Body Dressing Outcome: Adequate for Discharge Goal: Pt. Will Perform Toileting-Clothing Manipulation Outcome: Adequate for Discharge Goal: Pt/Caregiver Will Perform Home Exercise Program Outcome: Adequate for Discharge Goal: OT Additional ADL Goal #1 Outcome: Adequate for Discharge

## 2019-02-20 NOTE — Plan of Care (Signed)
  Problem: Education: Goal: Knowledge of disease or condition will improve Outcome: Adequate for Discharge Goal: Knowledge of secondary prevention will improve Outcome: Adequate for Discharge Goal: Knowledge of patient specific risk factors addressed and post discharge goals established will improve Outcome: Adequate for Discharge

## 2019-02-20 NOTE — Progress Notes (Signed)
Physical Therapy Treatment Patient Details Name: Stephanie [name] MRN: [id number] DOB: Jul 10, 1934 Today's Date: 02/20/2019    History of Present Illness Stephanie [name] is a 85 y.o. female with past medical history of hypertension, diabetes mellitus, coronary artery disease status post stent, chronic anemia  with the recent cardiac catheterization and admission for SVT presents to the ED after husband found patient confused after a fall. MRI shows L temporal infarct.    PT Comments    Patient progressing with ambulation and able to practice steps this session to prep for home entry as possible d/c today.  Spouse in room and reviewed safety protocols due to pt at risk for falls.  Will benefit from follow up [place] with spouse assist at d/c.    Follow Up Recommendations  Home health PT;Supervision/Assistance - 24 hour     Equipment Recommendations  None recommended by PT    Recommendations for Other Services       Precautions / Restrictions Precautions Precautions: Fall    Mobility  Bed Mobility   Bed Mobility: Supine to Sit     Supine to sit: Supervision        Transfers Overall transfer level: Needs assistance Equipment used: Rolling walker (2 wheeled) Transfers: Sit to/from Stand Sit to Stand: Min guard         General transfer comment: stood from bed and toilet with cues for hand placement  Ambulation/Gait Ambulation/Gait assistance: Min guard;Supervision Gait Distance (Feet): 150 Feet Assistive device: Rolling walker (2 wheeled) Gait Pattern/deviations: Step-to pattern;Step-through pattern;Decreased stride length;Shuffle;Drifts right/left     General Gait Details: R lateral lean noted with ambulation; educated spouse needing to keep hand on pt with ambulation due to high fall risk   Stairs Stairs: Yes Stairs assistance: Min assist Stair Management: One rail Left;Sideways;Step to pattern Number of Stairs: 4 General stair comments: up and down 2 stairs twice  with assist for balance, using rail as pt uses the door facing at home and spouse educated in Administrator Rankin (Stroke Patients Only) Modified Rankin (Stroke Patients Only) Pre-Morbid Rankin Score: Slight disability Modified Rankin: Moderately severe disability     Balance Overall balance assessment: Needs assistance Sitting-balance support: Feet supported Sitting balance-Leahy Scale: Fair     Standing balance support: Bilateral upper extremity supported;During functional activity Standing balance-Leahy Scale: Poor Standing balance comment: needs min A and UE support for balance                            Cognition Arousal/Alertness: Awake/alert Behavior During Therapy: WFL for tasks assessed/performed Overall Cognitive Status: Within Functional Limits for tasks assessed                                        Exercises      General Comments General comments (skin integrity, edema, etc.): toileted in bathroom and able to perform toilet hygiene seated, min A for balance with sit to stand and while donning briefs      Pertinent Vitals/Pain Pain Assessment: No/denies pain    Home Living                      Prior Function            PT Goals (current goals can now be found in the care  plan section) Progress towards PT goals: Progressing toward goals    Frequency    Min 4X/week      PT Plan Current plan remains appropriate    Co-evaluation              AM-PAC PT "6 Clicks" Mobility   Outcome Measure  Help needed turning from your back to your side while in a flat bed without using bedrails?: A Little Help needed moving from lying on your back to sitting on the side of a flat bed without using bedrails?: A Little Help needed moving to and from a bed to a chair (including a wheelchair)?: A Little Help needed standing up from a chair using your arms (e.g., wheelchair or bedside  chair)?: A Little Help needed to walk in hospital room?: A Little Help needed climbing 3-5 steps with a railing? : A Little 6 Click Score: 18    End of Session Equipment Utilized During Treatment: Gait belt Activity Tolerance: Patient tolerated treatment well Patient left: in chair;with call bell/phone within reach;with family/visitor present   PT Visit Diagnosis: Other abnormalities of gait and mobility (R26.89);Muscle weakness (generalized) (M62.81)     Time: 0488-8916 PT Time Calculation (min) (ACUTE ONLY): 29 min  Charges:  $Gait Training: 8-22 mins $Self Care/Home Management: [place], [place] [phone] 02/20/2019    Reginia Naas 02/20/2019, 1:28 PM

## 2019-02-22 ENCOUNTER — Telehealth: Payer: Self-pay | Admitting: Family Medicine

## 2019-02-22 ENCOUNTER — Encounter: Payer: Self-pay | Admitting: Family Medicine

## 2019-02-22 ENCOUNTER — Ambulatory Visit (INDEPENDENT_AMBULATORY_CARE_PROVIDER_SITE_OTHER): Payer: Medicare Other | Admitting: Family Medicine

## 2019-02-22 ENCOUNTER — Other Ambulatory Visit: Payer: Self-pay

## 2019-02-22 VITALS — BP 140/62 | HR 85 | Temp 97.6°F | Ht 61.0 in | Wt 110.3 lb

## 2019-02-22 DIAGNOSIS — I471 Supraventricular tachycardia: Secondary | ICD-10-CM

## 2019-02-22 DIAGNOSIS — I639 Cerebral infarction, unspecified: Secondary | ICD-10-CM

## 2019-02-22 DIAGNOSIS — I6523 Occlusion and stenosis of bilateral carotid arteries: Secondary | ICD-10-CM

## 2019-02-22 DIAGNOSIS — D649 Anemia, unspecified: Secondary | ICD-10-CM | POA: Diagnosis not present

## 2019-02-22 DIAGNOSIS — I2510 Atherosclerotic heart disease of native coronary artery without angina pectoris: Secondary | ICD-10-CM

## 2019-02-22 DIAGNOSIS — Z8673 Personal history of transient ischemic attack (TIA), and cerebral infarction without residual deficits: Secondary | ICD-10-CM | POA: Diagnosis not present

## 2019-02-22 DIAGNOSIS — E11319 Type 2 diabetes mellitus with unspecified diabetic retinopathy without macular edema: Secondary | ICD-10-CM

## 2019-02-22 DIAGNOSIS — E782 Mixed hyperlipidemia: Secondary | ICD-10-CM

## 2019-02-22 DIAGNOSIS — I248 Other forms of acute ischemic heart disease: Secondary | ICD-10-CM | POA: Diagnosis not present

## 2019-02-22 DIAGNOSIS — I10 Essential (primary) hypertension: Secondary | ICD-10-CM | POA: Diagnosis not present

## 2019-02-22 DIAGNOSIS — E871 Hypo-osmolality and hyponatremia: Secondary | ICD-10-CM | POA: Insufficient documentation

## 2019-02-22 DIAGNOSIS — E1169 Type 2 diabetes mellitus with other specified complication: Secondary | ICD-10-CM

## 2019-02-22 NOTE — Progress Notes (Signed)
Subjective:    Patient ID: Stephanie [name], female    DOB: 03-11-1934, 85 y.o.   MRN: [id number]  HPI Pt presents for f/u of recent hospitalization   She was hosp at home from 8/5 to 02/20/19 This was following a hospital stay for SVT/demand ischemia (nl cath)  After returning home she was found lying on the floor confused and EMs was called L facial droop was noted and then resolved  MRI brain showed L temporal infarct  Sodium was mildly low at 130 upon presentation   Hospital course as follows:  Hospital course:  Acute CVA Incidental in setting of recent heart catheterization. Likely not contributory to presentation. Home health PT, neurology follow-up outpatient. Discharge on Lipitor and continued diabetes management.  She was referred to Dr Glenda Chroman  Also has cardiology f/u upcoming   Most recent echo interp  1. Severe hypokinesis of the left ventricular, basal-mid inferoseptal wall, inferior wall and inferolateral wall.  2. The left ventricle has mildly reduced systolic function, with an ejection fraction of 45-50%. The cavity size was normal. Left ventricular diastolic Doppler parameters are consistent with impaired relaxation. Elevated mean left atrial pressure.  3. The right ventricle has normal systolic function. The cavity was normal. There is no increase in right ventricular wall thickness. Right ventricular systolic pressure could not be assessed.  4. Left atrial size was severely dilated.  5. Mild thickening of the mitral valve leaflet. Mild calcification of the mitral valve leaflet.  6. The aorta is normal in size and structure.  7. The aortic root is normal in size and structure.  8. Cannot exclude left to right shunt.  Carotid doppler report: Summary: Right Carotid: Velocities in the right ICA are consistent with a 40-59%                stenosis.  Left Carotid: Velocities in the left ICA are consistent with a 1-39% stenosis.  Vertebrals:  Bilateral  vertebral arteries demonstrate antegrade flow. Subclavians: Normal flow hemodynamics were seen in bilateral subclavian              arteries.  She currently takes lipitor and aggrenox   Hyponatremia Acute drop. Possibly mediated by chlorthalidone but possible some hypovolemia playing a role. Also on Paxil, which could also be playing a role. Patient appears euvolemic and not hypervolemic at this time. Treated with IV fluids and salt tablets with improvement. This was discontinued secondary to history of mild systolic heart failure. Will need close follow-up and she states she has an appointment in two days. Reasons for return discussed with patient and husband.  She was told to hold chlothalidone on d/c  bp today on first check 140/62  Essential hypertension Stable. Continue hydralazine, metoprolol. Discontinue chlorthalidone  Diabetes mellitus, type 2 SSI while inpatient. Continue home metformin and glipizide  Lab Results  Component Value Date   HGBA1C 6.4 (H) 02/17/2019  she is eating ice cream  Thinks it is "worth it" -despite having diabetes    Chronic anemia Hemoglobin stable  Lab Results  Component Value Date   WBC 10.2 02/18/2019   HGB 10.7 (L) 02/18/2019   HCT 30.7 (L) 02/18/2019   MCV 90.6 02/18/2019   PLT 235 02/18/2019    Lab Results  Component Value Date   CREATININE 0.97 02/20/2019   BUN 21 02/20/2019   NA 128 (L) 02/20/2019   K 3.3 (L) 02/20/2019   CL 97 (L) 02/20/2019   CO2 20 (L) 02/20/2019  Lab Results  Component Value Date   ALT 11 02/18/2019   AST 22 02/18/2019   ALKPHOS 45 02/18/2019   BILITOT 0.5 02/18/2019    Lab Results  Component Value Date   TSH 1.406 02/16/2019    [place] screening negative   Lab Results  Component Value Date   CHOL 115 02/18/2019   HDL 35 (L) 02/18/2019   LDLCALC 50 02/18/2019   LDLDIRECT 54.0 09/04/2015   TRIG 149 02/18/2019   CHOLHDL 3.3 02/18/2019    Today: BP Readings from Last 3 Encounters:   02/22/19 140/62  02/20/19 (!) 161/59  02/17/19 (!) 141/58   Pulse Readings from Last 3 Encounters:  02/22/19 85  02/20/19 71  02/17/19 64   Of note: pt accidentally took her chlorthalidone today  Otherwise off of it since first level was done   Today pt states she feels tired  Did not sleep well or eat well in the hospital  No trouble speaking and no slurring No facial droop  No unilateral weakness per pt   (though she may drift left when she walks)  PT is supposed to go to the house   Some headache -frontal    (she used to get headaches) No dizziness   Taking beta blocker -metoprolol   Has not checked her blood sugar   Wt Readings from Last 3 Encounters:  02/22/19 110 lb 5 oz (50 kg)  02/20/19 106 lb 7.7 oz (48.3 kg)  02/16/19 108 lb 3.9 oz (49.1 kg)   20.84 kg/m    Patient Active Problem List   Diagnosis Date Noted  . Hyponatremia 02/22/2019  . Demand ischemia ([place]) 02/17/2019  . Chest pain 02/17/2019  . Elevated troponin 02/17/2019  . Acute CVA (cerebrovascular accident) ([place]) 02/17/2019  . Non-ST elevation (NSTEMI) myocardial infarction ([place]) 02/16/2019  . NSTEMI (non-ST elevated myocardial infarction) ([place])   . SVT (supraventricular tachycardia) ([place]) 02/15/2019  . Colonic diverticular abscess 06/05/2018  . Anemia 06/05/2018  . Protein calorie malnutrition ([place]) 06/05/2018  . Fecal occult blood test positive 03/12/2018  . Closed fracture of left hip ([place])   . Chronic kidney disease (CKD), stage II (mild) 01/22/2018  . Fracture of femoral neck, left ([place]) 01/21/2018  . Depression 01/21/2018  . HLD (hyperlipidemia) 01/21/2018  . GERD (gastroesophageal reflux disease) 01/21/2018  . CAD (coronary artery disease) 01/21/2018  . Subcapital fracture of left hip ([place]) 01/21/2018  . Carotid stenosis, bilateral 11/20/2016  . Transient cerebral ischemia 11/06/2016  . Vitamin D deficiency 09/04/2015  . Routine general medical examination at a health care facility  09/04/2015  . Encounter for Medicare annual wellness exam 05/30/2014  . Colon cancer screening 05/30/2014  . History of fall 05/26/2013  . Gynecological examination 02/20/2011  . HELICOBACTER PYLORI INFECTION 12/19/2006  . HSV 12/19/2006  . Type 2 diabetes mellitus ([place]) 12/19/2006  . Diabetic retinopathy ([place]) 12/19/2006  . Combined hyperlipidemia associated with type 2 diabetes mellitus ([place]) 12/19/2006  . ANXIETY 12/19/2006  . HTN (hypertension) 12/19/2006  . Coronary atherosclerosis 12/19/2006  . Peripheral vascular disease due to secondary diabetes mellitus ([place]) 12/19/2006  . ALLERGIC RHINITIS 12/19/2006  . OSTEOARTHRITIS 12/19/2006  . Osteopenia 12/19/2006   Past Medical History:  Diagnosis Date  . Allergy    allergic rhinitis  . Anxiety   . Arthritis    osteoarthritis  . Degenerative disk disease    in neck  . Diabetes mellitus    type II  . Fracture of femoral neck, left ([place]) 01/21/2018  .  Hyperlipidemia   . Hypertension   . Hypoaldosteronism ([place]) 6/12   from DM causing hyperkalemia   . Osteopenia   . Periodontal disease   . Peripheral vascular disease ([place])   . Transient cerebral ischemia 11/06/2016   Past Surgical History:  Procedure Laterality Date  . CARDIAC CATHETERIZATION     x2 STENT  . CHOLECYSTECTOMY    . ESOPHAGOGASTRODUODENOSCOPY  03/2004   gastritis by biopsy  . GUM SURGERY  06/2010   laser surgery for gums  . HIP ARTHROPLASTY Left 01/21/2018   Procedure: ARTHROPLASTY BIPOLAR HIP (HEMIARTHROPLASTY);  Surgeon: Marchia Bond, MD;  Location: [facility];  Service: Orthopedics;  Laterality: Left;  . IR RADIOLOGIST EVAL & MGMT  06/17/2018  . LEFT HEART CATH AND CORONARY ANGIOGRAPHY N/A 02/16/2019   Procedure: LEFT HEART CATH AND CORONARY ANGIOGRAPHY;  Surgeon: Wellington Hampshire, MD;  Location: [facility];  Service: Cardiovascular;  Laterality: N/A;  . MM BREAST STEREO BX*L*R/S  12/12   normal  . TONSILLECTOMY    . TRANSTHORACIC ECHOCARDIOGRAM   11/2016   EF 55-60%, grade 1 DD with elevated LV end diastolic filling pressures, mildly dilated LA, mild TR and PR.   . TUBAL LIGATION     Social History   Tobacco Use  . Smoking status: Never Smoker  . Smokeless tobacco: Never Used  Substance Use Topics  . Alcohol use: No    Alcohol/week: 0.0 standard drinks  . Drug use: No   Family History  Problem Relation Age of Onset  . Cancer Mother        vaginal CA in situ  . Hypertension Mother   . Heart disease Mother        CAD and MI  . Heart disease Father        CAD  . Diabetes Father   . Heart disease Son        congenital valve problem   Allergies  Allergen Reactions  . Ace Inhibitors Other (See Comments)    Chest pain  . Amlodipine Besy-Benazepril Hcl Other (See Comments)    Chest pain  . Clopidogrel Bisulfate Other (See Comments)    GI upset  . Erythromycin Other (See Comments)    GI upset  . Tape Other (See Comments)    Causes soreness (of the skin)   Current Outpatient Medications on File Prior to Visit  Medication Sig Dispense Refill  . atorvastatin (LIPITOR) 20 MG tablet Take 1 tablet (20 mg total) by mouth daily. 90 tablet 0  . dipyridamole-aspirin (AGGRENOX) 200-25 MG 12hr capsule Take 1 capsule by mouth twice daily (Patient taking differently: Take 1 capsule by mouth 2 (two) times daily. ) 60 capsule 11  . famotidine (PEPCID) 40 MG tablet Take 1 tablet (40 mg total) by mouth daily. (Patient taking differently: Take 40 mg by mouth See admin instructions. Take 40 mg by mouth one to two times a day) 30 tablet 3  . glipiZIDE (GLIPIZIDE XL) 10 MG 24 hr tablet TAKE 1 TABLET BY MOUTH ONCE DAILY WITH BREAKFAST (Patient taking differently: Take 10 mg by mouth daily with breakfast. ) 90 tablet 3  . hydrALAZINE (APRESOLINE) 50 MG tablet Take 1 tablet (50 mg total) by mouth every 8 (eight) hours. 90 tablet 2  . metFORMIN (GLUCOPHAGE) 1000 MG tablet Take 1 tablet (1,000 mg total) by mouth 2 (two) times daily with a meal.  180 tablet 0  . metoprolol succinate (TOPROL-XL) 100 MG 24 hr tablet TAKE 1 TABLET BY  MOUTH ONCE DAILY TAKE  WITH  OR  IMMEDIATELY  FOLLOWING  A  MEAL 90 tablet 1  . nitroGLYCERIN (NITROSTAT) 0.4 MG SL tablet Place 1 tablet (0.4 mg total) under the tongue every 5 (five) minutes x 3 doses as needed for chest pain. 25 tablet 1  . PARoxetine (PAXIL) 40 MG tablet Take 1 tablet by mouth daily (Patient taking differently: Take 40 mg by mouth at bedtime. ) 90 tablet 1   No current facility-administered medications on file prior to visit.      Review of Systems  Constitutional: Positive for fatigue. Negative for activity change, appetite change, fever and unexpected weight change.  HENT: Negative for congestion, ear pain, rhinorrhea, sinus pressure and sore throat.   Eyes: Negative for pain, redness and visual disturbance.  Respiratory: Negative for cough, shortness of breath and wheezing.   Cardiovascular: Negative for chest pain and palpitations.  Gastrointestinal: Negative for abdominal pain, blood in stool, constipation and diarrhea.  Endocrine: Negative for polydipsia and polyuria.  Genitourinary: Negative for dysuria, frequency and urgency.  Musculoskeletal: Negative for arthralgias, back pain and myalgias.  Skin: Negative for pallor and rash.  Allergic/Immunologic: Negative for environmental allergies.  Neurological: Negative for dizziness, syncope and headaches.  Hematological: Negative for adenopathy. Does not bruise/bleed easily.  Psychiatric/Behavioral: Negative for decreased concentration and dysphoric mood. The patient is not nervous/anxious.        Objective:   Physical Exam Constitutional:      General: She is not in acute distress.    Appearance: Normal appearance. She is well-developed and normal weight. She is not ill-appearing.  HENT:     Head: Normocephalic and atraumatic.     Mouth/Throat:     Mouth: Mucous membranes are moist.     Pharynx: No posterior oropharyngeal  erythema.  Eyes:     General: No scleral icterus.    Conjunctiva/sclera: Conjunctivae normal.     Pupils: Pupils are equal, round, and reactive to light.  Neck:     Musculoskeletal: Normal range of motion and neck supple. No neck rigidity or muscular tenderness.     Thyroid: No thyromegaly.     Vascular: No carotid bruit or JVD.  Cardiovascular:     Rate and Rhythm: Normal rate and regular rhythm.     Pulses: Normal pulses.     Heart sounds: Normal heart sounds. No murmur. No gallop.   Pulmonary:     Effort: Pulmonary effort is normal. No respiratory distress.     Breath sounds: Normal breath sounds. No wheezing or rales.     Comments: Good air exch Abdominal:     General: Bowel sounds are normal. There is no distension or abdominal bruit.     Palpations: Abdomen is soft. There is no mass.     Tenderness: There is no abdominal tenderness.     Hernia: No hernia is present.  Musculoskeletal:     Right lower leg: No edema.     Left lower leg: No edema.  Lymphadenopathy:     Cervical: No cervical adenopathy.  Skin:    General: Skin is warm and dry.     Findings: No rash.     Comments: Fair complexion   Neurological:     General: No focal deficit present.     Mental Status: She is alert. Mental status is at baseline.     Cranial Nerves: No cranial nerve deficit.     Sensory: No sensory deficit.     Coordination: Coordination normal.  Deep Tendon Reflexes: Reflexes are normal and symmetric.     Comments: Gait is steady with support   Strength of all ext is symmetric No facial droop or speech change  Psychiatric:        Mood and Affect: Mood normal.     Comments: Supportive son is with her today           Assessment & Plan:   Problem List Items Addressed This Visit      Cardiovascular and Mediastinum   HTN (hypertension)    bp in fair control at this time  BP Readings from Last 1 Encounters:  02/22/19 140/62   No changes needed Most recent labs reviewed   Disc lifstyle change with low sodium diet and exercise  inst to hold her chlorthalidone due to hyponatremia She accidentally took it today (only day since hosp)  Bmp now        Carotid stenosis, bilateral    Now 40-59% on R and 1-39% on L (ICA) Unsure if this caused embolic cva Taking aggrenox and statin  Reviewed hospital records, lab results and studies in detail   For neuro f/u  Also sees vascular surgery       CAD (coronary artery disease)    Recent cath stable after experiencing demand ischemia from SVT Reviewed hospital records, lab results and studies in detail   For f/u with cardiology soon        SVT (supraventricular tachycardia) ([place])    Cardioverted in hosp by adenosine and resulting in demand ischemia  Now resolved Reviewed hospital records, lab results and studies in detail   For cardiac f/u soon      Demand ischemia [facility])    From episode of SVT (cardioverted by adenosine)  Much improved clinically  Reviewed hospital records, lab results and studies in detail   For f/u cardiology  Reassuring cath      Acute CVA (cerebrovascular accident) [facility]) - Primary    Reviewed hospital records, lab results and studies in detail  Clinically asymptomatic except for fatigue  Home PT to start soon  Reassuring exam  Disc imp of good bp/chol/dm control  Continues aggrenox and statin  Ref made for neuro as req by hospital d/c summary  inst to call 911 if any stroke symptoms return  No embolic source found- but pt does have carotid stenosis  No h/o a fib      Relevant Orders   Ambulatory referral to Neurology     Endocrine   Type 2 diabetes mellitus ([place])   Combined hyperlipidemia associated with type 2 diabetes mellitus ([place])    Lab Results  Component Value Date   HGBA1C 6.4 (H) 02/17/2019   This is up but still in fair control  Enc to start checking her blood glucose  Also avoid sweets/ice cream  For f/u soon        Other   Anemia (Chronic)    This  appeared stable in the hospital  Re check today  Per pt eating better now       Relevant Orders   CBC with Differential/Platelet   Hyponatremia    128 at d/c thought to be due to chlorthalidone (less likely ssri)  Also K of 3.3 Not a huge water drinker  Forgot to hold chlortalidone today  Bmp now  Reviewed hospital records, lab results and studies in detail        Relevant Orders   Basic metabolic panel

## 2019-02-22 NOTE — Assessment & Plan Note (Signed)
This appeared stable in the hospital  Re check today  Per pt eating better now

## 2019-02-22 NOTE — Assessment & Plan Note (Signed)
Recent cath stable after experiencing demand ischemia from SVT Reviewed hospital records, lab results and studies in detail   For f/u with cardiology soon

## 2019-02-22 NOTE — Assessment & Plan Note (Signed)
bp in fair control at this time  BP Readings from Last 1 Encounters:  02/22/19 140/62   No changes needed Most recent labs reviewed  Disc lifstyle change with low sodium diet and exercise  inst to hold her chlorthalidone due to hyponatremia She accidentally took it today (only day since hosp)  Bmp now

## 2019-02-22 NOTE — Assessment & Plan Note (Signed)
Now 40-59% on R and 1-39% on L (ICA) Unsure if this caused embolic cva Taking aggrenox and statin  Reviewed hospital records, lab results and studies in detail   For neuro f/u  Also sees vascular surgery

## 2019-02-22 NOTE — Patient Instructions (Addendum)
Off the chlorthalidone  Once in a while when relaxed please check a blood pressure   Labs today -we will let you know when they return   Stay off the chlorthalidone please   I placed a referral to neurology - our office will call you   Let's re schedule your annual visit for a month

## 2019-02-22 NOTE — Telephone Encounter (Signed)
For now-skip labs prior Thanks

## 2019-02-22 NOTE — Assessment & Plan Note (Signed)
Lab Results  Component Value Date   HGBA1C 6.4 (H) 02/17/2019   This is up but still in fair control  Enc to start checking her blood glucose  Also avoid sweets/ice cream  For f/u soon

## 2019-02-22 NOTE — Assessment & Plan Note (Signed)
128 at d/c thought to be due to chlorthalidone (less likely ssri)  Also K of 3.3 Not a huge water drinker  Forgot to hold chlortalidone today  Bmp now  Reviewed hospital records, lab results and studies in detail

## 2019-02-22 NOTE — Telephone Encounter (Signed)
Best number [phone]  Pt scheduled her cpx 9/11 does pt needs labs prior. She stated she was in hospital and had labs today.

## 2019-02-22 NOTE — Assessment & Plan Note (Signed)
From episode of SVT (cardioverted by adenosine)  Much improved clinically  Reviewed hospital records, lab results and studies in detail   For f/u cardiology  Reassuring cath

## 2019-02-22 NOTE — Assessment & Plan Note (Signed)
Cardioverted in hosp by adenosine and resulting in demand ischemia  Now resolved Reviewed hospital records, lab results and studies in detail   For cardiac f/u soon

## 2019-02-22 NOTE — Assessment & Plan Note (Signed)
Reviewed hospital records, lab results and studies in detail  Clinically asymptomatic except for fatigue  Home PT to start soon  Reassuring exam  Disc imp of good bp/chol/dm control  Continues aggrenox and statin  Ref made for neuro as req by hospital d/c summary  inst to call 911 if any stroke symptoms return  No embolic source found- but pt does have carotid stenosis  No h/o a fib

## 2019-02-23 LAB — BASIC METABOLIC PANEL
BUN: 34 mg/dL — ABNORMAL HIGH (ref 6–23)
CO2: 23 mEq/L (ref 19–32)
Calcium: 9.5 mg/dL (ref 8.4–10.5)
Chloride: 99 mEq/L (ref 96–112)
Creatinine, Ser: 1.15 mg/dL (ref 0.40–1.20)
GFR: 45.06 mL/min — ABNORMAL LOW (ref >60.00)
Glucose, Bld: 281 mg/dL — ABNORMAL HIGH (ref 70–99)
Potassium: 4.3 mEq/L (ref 3.5–5.1)
Sodium: 130 mEq/L — ABNORMAL LOW (ref 135–145)

## 2019-02-23 LAB — CBC WITH DIFFERENTIAL/PLATELET
Basophils Absolute: 0 10*3/uL (ref 0.0–0.1)
Basophils Relative: 0.2 % (ref 0.0–3.0)
Eosinophils Absolute: 0.1 10*3/uL (ref 0.0–0.7)
Eosinophils Relative: 0.7 % (ref 0.0–5.0)
HCT: 30.3 % — ABNORMAL LOW (ref 36.0–46.0)
Hemoglobin: 10.2 g/dL — ABNORMAL LOW (ref 12.0–15.0)
Lymphocytes Relative: 26.4 % (ref 12.0–46.0)
Lymphs Abs: 2.6 10*3/uL (ref 0.7–4.0)
MCHC: 33.7 g/dL (ref 30.0–36.0)
MCV: 93.7 fl (ref 78.0–100.0)
Monocytes Absolute: 0.8 10*3/uL (ref 0.1–1.0)
Monocytes Relative: 7.7 % (ref 3.0–12.0)
Neutro Abs: 6.3 10*3/uL (ref 1.4–7.7)
Neutrophils Relative %: 65 % (ref 43.0–77.0)
Platelets: 271 10*3/uL (ref 150.0–400.0)
RBC: 3.23 Mil/uL — ABNORMAL LOW (ref 3.87–5.11)
RDW: 13 % (ref 11.5–15.5)
WBC: 9.7 10*3/uL (ref 4.0–10.5)

## 2019-02-23 NOTE — Telephone Encounter (Signed)
Pt aware.

## 2019-02-24 ENCOUNTER — Encounter: Payer: Self-pay | Admitting: *Deleted

## 2019-02-24 ENCOUNTER — Other Ambulatory Visit: Payer: Self-pay | Admitting: *Deleted

## 2019-02-24 DIAGNOSIS — I129 Hypertensive chronic kidney disease with stage 1 through stage 4 chronic kidney disease, or unspecified chronic kidney disease: Secondary | ICD-10-CM | POA: Diagnosis not present

## 2019-02-24 DIAGNOSIS — N182 Chronic kidney disease, stage 2 (mild): Secondary | ICD-10-CM | POA: Diagnosis not present

## 2019-02-24 DIAGNOSIS — R29898 Other symptoms and signs involving the musculoskeletal system: Secondary | ICD-10-CM | POA: Diagnosis not present

## 2019-02-24 DIAGNOSIS — E1122 Type 2 diabetes mellitus with diabetic chronic kidney disease: Secondary | ICD-10-CM | POA: Diagnosis not present

## 2019-02-24 DIAGNOSIS — I69398 Other sequelae of cerebral infarction: Secondary | ICD-10-CM | POA: Diagnosis not present

## 2019-02-24 NOTE — Patient Outreach (Signed)
[facility]) Care Management  02/24/2019  LAELAH SIRAVO April 27, 1934 [id number]   EMMI- stroke d/c from [place] Day # 1 Date: Monday 02/22/19  Red Alert Reason: Feeling worse overall? Yes    Insurance: united healthcare medicare   [facility] admissions x  2 ED visits x 2 in the last 6 months  Last admission 02/17/19 CVA   Patient returned a call to [place] Patient is able to verify HIPAA Reviewed and addressed EMMI red alert/referral to [facility] with patient  EMMI   Mrs Ammons states the answer to the EMMI question was documented incorrectly  She denies feeling worse overall on 02/22/19 She confirms on 02/23/19 she had her hospital f/u with her primary care MD and she feels well. She reports since her hospitalization she has been out to complete errands and out to dinner.    She denies any medical concerns today during this [facility] assessment and denies need of need of any [facility] services after [facility] RN CM assessed her and reviewed availability of [facility] RN CMs, health coaches, SW, NP and pharmacy staff    Social: Mrs Samara is a 85 year old retired female living at home with her husband, Luvenia Heller and her dog. She voices being independent with ADLs and independent/assist with iADLs She has her husband to assist with transportation to medical appointments   Conditions: 02/17/19 CVA anemia, hyponatremia, osteoarthritis, hx of fx of femora neck, subcapital closed fx of left hip, CKD, DM, anxiety, DM retinopathy, hx of fall, HLD, GERD, allergic rhinitis, demand ischemia. HTN, bilateral carotid stenosis, CAD, SVT, NSTEMI, PVD due to DM, colonic diverticular abscess, helicobacter pylori infection, HSV, depression, vitamin D deficiency   DME: walker    Medications: She denies concerns with taking medications as prescribed, affording medications, side effects of medications and questions about medications   Appointments: 02/22/19 Primary CareMD hospt F/u 1400 03/10/19 1530  Cardiology [facility] 03/11/19 neurology 0930 Dr Leonie Man new pt 03/26/19 [place] primary MD Physical 1 month f/u    Advance Directives: Denies need for assist with advance directives    Consent: [facility] RN CM reviewed [facility] 2 services with patient. Patient gave verbal consent for services [facility] telephonic RN CM.  Plans [facility] RN CM will close case at this time as patient has been assessed and no needs identified/needs resolved.   Pt encouraged to return a call to [place] CM prn  [facility] RN CM disused the outreach letter sent on 02/17/19 with [facility] brochure enclosed for review  Eutimio Gharibian L. Lavina Hamman, RN, BSN, [place] Coordinator Office number [phone] Mobile number [phone]  Main [facility] number [phone] Fax number [phone]

## 2019-02-24 NOTE — Patient Outreach (Addendum)
[facility]) Care Management  02/24/2019  Stephanie [name] 03/02/1934 [id number]   EMMI- stroke d/c from [place] Day # 1 Date: Monday 02/22/19  Red Alert Reason: Feeling worse overall? Yes    Insurance: united healthcare medicare   [facility] admissions x  2 ED visits x 2 in the last 6 months  Last admission 02/17/19 CVA  Outreach attempt # 1 Her husband Stephanie [name] answered. He states she is still sleeping. [facility] RN CM left HIPAA compliant voicemail message along with CM's contact info.   Plan: [facility] RN CM sent an unsuccessful outreach letter and scheduled this patient for another call attempt within 4 business days   Kimberly L. Lavina Hamman, RN, BSN, [place] Coordinator Office number [phone] Mobile number [phone]  Main [facility] number [phone] Fax number [phone]

## 2019-02-25 ENCOUNTER — Ambulatory Visit: Payer: Medicare Other | Admitting: *Deleted

## 2019-02-26 DIAGNOSIS — E1122 Type 2 diabetes mellitus with diabetic chronic kidney disease: Secondary | ICD-10-CM | POA: Diagnosis not present

## 2019-02-26 DIAGNOSIS — I69398 Other sequelae of cerebral infarction: Secondary | ICD-10-CM | POA: Diagnosis not present

## 2019-02-26 DIAGNOSIS — N182 Chronic kidney disease, stage 2 (mild): Secondary | ICD-10-CM | POA: Diagnosis not present

## 2019-02-26 DIAGNOSIS — R29898 Other symptoms and signs involving the musculoskeletal system: Secondary | ICD-10-CM | POA: Diagnosis not present

## 2019-02-26 DIAGNOSIS — I129 Hypertensive chronic kidney disease with stage 1 through stage 4 chronic kidney disease, or unspecified chronic kidney disease: Secondary | ICD-10-CM | POA: Diagnosis not present

## 2019-03-01 DIAGNOSIS — I129 Hypertensive chronic kidney disease with stage 1 through stage 4 chronic kidney disease, or unspecified chronic kidney disease: Secondary | ICD-10-CM | POA: Diagnosis not present

## 2019-03-01 DIAGNOSIS — N182 Chronic kidney disease, stage 2 (mild): Secondary | ICD-10-CM | POA: Diagnosis not present

## 2019-03-01 DIAGNOSIS — R29898 Other symptoms and signs involving the musculoskeletal system: Secondary | ICD-10-CM | POA: Diagnosis not present

## 2019-03-01 DIAGNOSIS — I69398 Other sequelae of cerebral infarction: Secondary | ICD-10-CM | POA: Diagnosis not present

## 2019-03-01 DIAGNOSIS — E1122 Type 2 diabetes mellitus with diabetic chronic kidney disease: Secondary | ICD-10-CM | POA: Diagnosis not present

## 2019-03-04 DIAGNOSIS — I129 Hypertensive chronic kidney disease with stage 1 through stage 4 chronic kidney disease, or unspecified chronic kidney disease: Secondary | ICD-10-CM | POA: Diagnosis not present

## 2019-03-04 DIAGNOSIS — R29898 Other symptoms and signs involving the musculoskeletal system: Secondary | ICD-10-CM | POA: Diagnosis not present

## 2019-03-04 DIAGNOSIS — I69398 Other sequelae of cerebral infarction: Secondary | ICD-10-CM | POA: Diagnosis not present

## 2019-03-04 DIAGNOSIS — E1122 Type 2 diabetes mellitus with diabetic chronic kidney disease: Secondary | ICD-10-CM | POA: Diagnosis not present

## 2019-03-04 DIAGNOSIS — N182 Chronic kidney disease, stage 2 (mild): Secondary | ICD-10-CM | POA: Diagnosis not present

## 2019-03-05 DIAGNOSIS — D631 Anemia in chronic kidney disease: Secondary | ICD-10-CM

## 2019-03-05 DIAGNOSIS — E871 Hypo-osmolality and hyponatremia: Secondary | ICD-10-CM

## 2019-03-05 DIAGNOSIS — E041 Nontoxic single thyroid nodule: Secondary | ICD-10-CM

## 2019-03-05 DIAGNOSIS — E785 Hyperlipidemia, unspecified: Secondary | ICD-10-CM

## 2019-03-05 DIAGNOSIS — Z1159 Encounter for screening for other viral diseases: Secondary | ICD-10-CM

## 2019-03-05 DIAGNOSIS — I472 Ventricular tachycardia: Secondary | ICD-10-CM

## 2019-03-05 DIAGNOSIS — I2510 Atherosclerotic heart disease of native coronary artery without angina pectoris: Secondary | ICD-10-CM

## 2019-03-05 DIAGNOSIS — R29898 Other symptoms and signs involving the musculoskeletal system: Secondary | ICD-10-CM | POA: Diagnosis not present

## 2019-03-05 DIAGNOSIS — N182 Chronic kidney disease, stage 2 (mild): Secondary | ICD-10-CM

## 2019-03-05 DIAGNOSIS — M4183 Other forms of scoliosis, cervicothoracic region: Secondary | ICD-10-CM

## 2019-03-05 DIAGNOSIS — Z955 Presence of coronary angioplasty implant and graft: Secondary | ICD-10-CM

## 2019-03-05 DIAGNOSIS — I129 Hypertensive chronic kidney disease with stage 1 through stage 4 chronic kidney disease, or unspecified chronic kidney disease: Secondary | ICD-10-CM | POA: Diagnosis not present

## 2019-03-05 DIAGNOSIS — Z7984 Long term (current) use of oral hypoglycemic drugs: Secondary | ICD-10-CM

## 2019-03-05 DIAGNOSIS — Z9181 History of falling: Secondary | ICD-10-CM

## 2019-03-05 DIAGNOSIS — I700 Atherosclerosis of aorta: Secondary | ICD-10-CM

## 2019-03-05 DIAGNOSIS — E1122 Type 2 diabetes mellitus with diabetic chronic kidney disease: Secondary | ICD-10-CM | POA: Diagnosis not present

## 2019-03-05 DIAGNOSIS — I69398 Other sequelae of cerebral infarction: Secondary | ICD-10-CM | POA: Diagnosis not present

## 2019-03-05 DIAGNOSIS — M47812 Spondylosis without myelopathy or radiculopathy, cervical region: Secondary | ICD-10-CM

## 2019-03-10 ENCOUNTER — Encounter: Payer: Self-pay | Admitting: Nurse Practitioner

## 2019-03-10 ENCOUNTER — Ambulatory Visit (INDEPENDENT_AMBULATORY_CARE_PROVIDER_SITE_OTHER): Payer: Medicare Other | Admitting: Nurse Practitioner

## 2019-03-10 ENCOUNTER — Other Ambulatory Visit
Admission: RE | Admit: 2019-03-10 | Discharge: 2019-03-10 | Disposition: A | Discharge status: Home / Self Care | Payer: Medicare Other | Source: Ambulatory Visit | Attending: Nurse Practitioner | Admitting: Nurse Practitioner

## 2019-03-10 ENCOUNTER — Other Ambulatory Visit: Payer: Self-pay

## 2019-03-10 VITALS — BP 128/68 | HR 71 | Ht 61.0 in | Wt 109.0 lb

## 2019-03-10 DIAGNOSIS — I2510 Atherosclerotic heart disease of native coronary artery without angina pectoris: Secondary | ICD-10-CM | POA: Diagnosis not present

## 2019-03-10 DIAGNOSIS — I214 Non-ST elevation (NSTEMI) myocardial infarction: Secondary | ICD-10-CM | POA: Diagnosis not present

## 2019-03-10 DIAGNOSIS — I471 Supraventricular tachycardia: Secondary | ICD-10-CM

## 2019-03-10 DIAGNOSIS — I10 Essential (primary) hypertension: Secondary | ICD-10-CM | POA: Diagnosis not present

## 2019-03-10 DIAGNOSIS — I639 Cerebral infarction, unspecified: Secondary | ICD-10-CM

## 2019-03-10 DIAGNOSIS — E785 Hyperlipidemia, unspecified: Secondary | ICD-10-CM | POA: Diagnosis not present

## 2019-03-10 LAB — BASIC METABOLIC PANEL
Anion gap: 11 (ref 5–15)
BUN: 30 mg/dL — ABNORMAL HIGH (ref 8–23)
CO2: 23 mmol/L (ref 22–32)
Calcium: 9.6 mg/dL (ref 8.9–10.3)
Chloride: 101 mmol/L (ref 98–111)
Creatinine, Ser: 1.18 mg/dL — ABNORMAL HIGH (ref 0.44–1.00)
GFR calc Af Amer: 49 mL/min — ABNORMAL LOW (ref >60)
GFR calc non Af Amer: 43 mL/min — ABNORMAL LOW (ref >60)
Glucose, Bld: 59 mg/dL — ABNORMAL LOW (ref 70–99)
Potassium: 4.2 mmol/L (ref 3.5–5.1)
Sodium: 135 mmol/L (ref 135–145)

## 2019-03-10 NOTE — Progress Notes (Signed)
Office Visit    Patient Name: Stephanie [name] Date of Encounter: 03/10/2019  Primary Care Provider:  Abner Greenspan, MD Primary Cardiologist:  Ida Rogue, MD  Chief Complaint    85 year old female with a prior history of CAD, hypertension, hyperlipidemia, diabetes, and TIA, who presents for follow-up after recent hospitalizations for PSVT, non-STEMI, and subsequent stroke.  Past Medical History    Past Medical History:  Diagnosis Date   Allergy    allergic rhinitis   Anxiety    Arthritis    osteoarthritis   CAD (coronary artery disease)    a. s/p prior stenting of the LAD and D1 (02/2004); b. 02/2019 NSTEMI in setting of PSVT/Cath: >M 32m/d, LAD 40ost/p/m, D1 60, LCX 30m, 50d, RCA  nl-->Med Rx.   Carotid arterial disease ([place])    a. 02/2019 Carotid U/S: RICA 123XX123, LICA [id number]. Antegrade bilat vert flow.   Degenerative disk disease    in neck   Diabetes mellitus    type II   Fracture of femoral neck, left ([place]) 01/21/2018   Hyperlipidemia    Hypertension    Hypoaldosteronism ([place]) 6/12   from DM causing hyperkalemia    Hyponatremia    a. 02/2019 in setting of chlorthalidone rx (d/c'd).   Ischemic cardiomyopathy    a. 11/2016 Echo: EF 55-60%, Gr1 DD; b. 02/2019 Echo: EF 45-50%, sev basal-mid infsept, inf, inflat HK. DD. Sev dil LA.   Osteopenia    Periodontal disease    Peripheral vascular disease (HCC)    PSVT (paroxysmal supraventricular tachycardia) ([place])    a. 02/2019 - in absence of BB therapy-->broke w/ adenosine.   Stroke [facility])    a. 02/2019 MRI: L temporal infarct (1 day following cardiac cath).   Transient cerebral ischemia 11/06/2016   Past Surgical History:  Procedure Laterality Date   CARDIAC CATHETERIZATION     x2 STENT   CHOLECYSTECTOMY     ESOPHAGOGASTRODUODENOSCOPY  03/2004   gastritis by biopsy   GUM SURGERY  06/2010   laser surgery for gums   HIP ARTHROPLASTY Left 01/21/2018   Procedure: ARTHROPLASTY BIPOLAR HIP  (HEMIARTHROPLASTY);  Surgeon: Marchia Bond, MD;  Location: [facility];  Service: Orthopedics;  Laterality: Left;   IR RADIOLOGIST EVAL & MGMT  06/17/2018   LEFT HEART CATH AND CORONARY ANGIOGRAPHY N/A 02/16/2019   Procedure: LEFT HEART CATH AND CORONARY ANGIOGRAPHY;  Surgeon: Wellington Hampshire, MD;  Location: [facility];  Service: Cardiovascular;  Laterality: N/A;   MM BREAST STEREO BX*L*R/S  12/12   normal   TONSILLECTOMY     TRANSTHORACIC ECHOCARDIOGRAM  11/2016   EF 55-60%, grade 1 DD with elevated LV end diastolic filling pressures, mildly dilated LA, mild TR and PR.    TUBAL LIGATION      Allergies  Allergies  Allergen Reactions   Ace Inhibitors Other (See Comments)    Chest pain   Amlodipine Besy-Benazepril Hcl Other (See Comments)    Chest pain   Clopidogrel Bisulfate Other (See Comments)    GI upset   Erythromycin Other (See Comments)    GI upset   Tape Other (See Comments)    Causes soreness (of the skin)    History of Present Illness    85 year old female with above complex past medical history including coronary artery disease status post prior LAD and diagonal stenting in the early 2000, hypertension, hyperlipidemia, diabetes, TIA in April 2018, and mild carotid arterial disease.  She was last seen in cardiology  clinic in March 2019, at which time she was doing well.  In early August, she was at home and had sudden onset of tachypalpitations and chest heaviness.  Of note, she had run out of beta-blocker therapy and was awaiting a refill just a few days before.  She was found to be in SVT by the EMTs and she was successfully treated with adenosine.  She was taken to the [facility] ED where she was found to have troponin elevation in which she was admitted for further evaluation.  She peaked her high-sensitivity troponin at 3935.  Echo showed an EF of 45 to 50% with severe hypokinesis of the basal-mid inferoseptal, inferior, and inferolateral walls.  Diagnostic  catheterization was performed and showed moderate nonobstructive multivessel disease.  It was felt that troponin elevation was secondary to demand ischemia and medical therapy was recommended.  She was subsequently discharged home on April 5 however later that day, she had fallen and then was confused.  Her husband called EMS and she was taken back to the emergency department.  There, an MRI showed an acute left temporal infarct.  She was hyponatremic and home dose of chlorthalidone was discontinued.  Carotid ultrasound showed 40 to 59% right internal carotid artery stenosis and 1 to 39% left internal carotid artery stenosis.  She was subsequently discharged home on August 8 on Aggrenox and statin therapy.  Since discharge, she has not had any recurrent palpitations, chest pain, or dyspnea.  She is having some balance issues but is working with physical therapy and tolerating that well.  She denies PND, orthopnea, dizziness, syncope, edema, or early satiety.  She did have a follow-up basic metabolic panel on August 10 which showed slight improvement in sodium to 130-up from 128 on August 8.    Home Medications    Prior to Admission medications   Medication Sig Start Date End Date Taking? Authorizing Provider  atorvastatin (LIPITOR) 20 MG tablet Take 1 tablet (20 mg total) by mouth daily. 02/17/19  Yes Sande Rives E, PA-C  dipyridamole-aspirin (AGGRENOX) 200-25 MG 12hr capsule Take 1 capsule by mouth twice daily Patient taking differently: Take 1 capsule by mouth 2 (two) times daily.  09/24/18  Yes Tower, Wynelle Fanny, MD  famotidine (PEPCID) 40 MG tablet Take 1 tablet (40 mg total) by mouth daily. Patient taking differently: Take 40 mg by mouth See admin instructions. Take 40 mg by mouth one to two times a day 04/28/18  Yes Thornton Park, MD  glipiZIDE (GLIPIZIDE XL) 10 MG 24 hr tablet TAKE 1 TABLET BY MOUTH ONCE DAILY WITH BREAKFAST Patient taking differently: Take 10 mg by mouth daily with  breakfast.  02/16/18  Yes Tower, Wynelle Fanny, MD  hydrALAZINE (APRESOLINE) 50 MG tablet Take 1 tablet (50 mg total) by mouth every 8 (eight) hours. 02/17/19  Yes Sande Rives E, PA-C  metFORMIN (GLUCOPHAGE) 1000 MG tablet Take 1 tablet (1,000 mg total) by mouth 2 (two) times daily with a meal. 02/18/19  Yes Sande Rives E, PA-C  metoprolol succinate (TOPROL-XL) 100 MG 24 hr tablet TAKE 1 TABLET BY MOUTH ONCE DAILY TAKE  WITH  OR  IMMEDIATELY  FOLLOWING  A  MEAL 02/17/19  Yes Sarajane Jews, Callie E, PA-C  nitroGLYCERIN (NITROSTAT) 0.4 MG SL tablet Place 1 tablet (0.4 mg total) under the tongue every 5 (five) minutes x 3 doses as needed for chest pain. 02/17/19  Yes Sande Rives E, PA-C  PARoxetine (PAXIL) 40 MG tablet Take 1 tablet by mouth daily  Patient taking differently: Take 40 mg by mouth at bedtime.  10/02/18  Yes Tower, Wynelle Fanny, MD    Review of Systems    Having some balance systems since her stroke though she is tolerating physical therapy well.  She sometimes feels as though her head has been heavy and has had some difficulty sleeping.  She denies chest pain, palpitations, dyspnea, pnd, orthopnea, n, v, dizziness, syncope, edema, weight gain, or early satiety.  All other systems reviewed and are otherwise negative except as noted above.  Physical Exam    VS:  BP 128/68 (BP Location: Left Arm, Patient Position: Sitting, Cuff Size: Normal)    Pulse 71    Ht 5\' 1"  (1.549 m)    Wt 109 lb (49.4 kg)    SpO2 98%    BMI 20.60 kg/m  , BMI Body mass index is 20.6 kg/m. GEN: Well nourished, well developed, in no acute distress. HEENT: normal. Neck: Supple, no JVD, carotid bruits, or masses. Cardiac: RRR, no murmurs, rubs, or gallops. No clubbing, cyanosis, edema.  Radials/PT 2+ and equal bilaterally.  Right radial catheterization site without bleeding, bruit, or hematoma. Respiratory:  Respirations regular and unlabored, clear to auscultation bilaterally. GI: Soft, nontender, nondistended, BS + x  4. MS: no deformity or atrophy. Skin: warm and dry, no rash. Neuro:  Strength and sensation are intact. Psych: Normal affect.  Accessory Clinical Findings    ECG personally reviewed by me today -regular sinus rhythm, 72, septal infarct- no acute changes.  Lab Results  Component Value Date   WBC 9.7 02/22/2019   HGB 10.2 (L) 02/22/2019   HCT 30.3 (L) 02/22/2019   MCV 93.7 02/22/2019   PLT 271.0 02/22/2019   Lab Results  Component Value Date   CREATININE 1.15 02/22/2019   BUN 34 (H) 02/22/2019   NA 130 (L) 02/22/2019   K 4.3 02/22/2019   CL 99 02/22/2019   CO2 23 02/22/2019   Lab Results  Component Value Date   ALT 11 02/18/2019   AST 22 02/18/2019   ALKPHOS 45 02/18/2019   BILITOT 0.5 02/18/2019   Lab Results  Component Value Date   CHOL 115 02/18/2019   HDL 35 (L) 02/18/2019   LDLCALC 50 02/18/2019   LDLDIRECT 54.0 09/04/2015   TRIG 149 02/18/2019   CHOLHDL 3.3 02/18/2019     Assessment & Plan    1.  Non-STEMI, subsequent episode of care/CAD: Status post recent admission in the setting of PSVT and chest pain with high-sensitivity troponin elevation to a peak of 3935.  Echo showed mild LV dysfunction with an EF of 45 to 50%.  Diagnostic catheterization showed moderate nonobstructive CAD and medical therapy was recommended.  It was felt that troponin elevation was most likely secondary to demand ischemia in the setting of PSVT.  She has not had recurrent chest pain or dyspnea.  She remains on aspirin in the form of Aggrenox, statin, and beta-blocker therapy.  2.  PSVT: See #1.  Stable now that she is back on beta-blocker.  Denies any recurrent palpitations, chest pain, or dyspnea.  3.  Left temporal stroke: This occurred 1 day after her diagnostic catheterization after she had been discharged from a cardiac standpoint.  Recovering well and tolerating physical therapy.  She remains on Aggrenox and statin therapy.  4.  Essential hypertension: Stable on hydralazine and  beta-blocker.  She was previously on chlorthalidone however this was discontinued in the setting of hyponatremia.  5.  Hyperlipidemia: LDL of  50 earlier this month.  Normal LFTs.  Continue statin therapy.  6.  Hyponatremia: Noted during hospitalization in the setting of chlorthalidone therapy.  This is been on hold.  Last sodium was 130 on August 10.  We will follow-up today.  7.  Type 2 diabetes mellitus: On metformin and glipizide with an A1c of 6.4 in August 5.  8.  Abnormal chest x-ray: Recent chest x-ray on August 3 showed a summation shadow versus nodule in the left lung apex.  There was a recommendation for follow-up chest x-ray versus CT.  Will defer to primary care.  9.  Disposition: Follow-up basic metabolic panel today.  Follow-up in clinic in 2 to 3 months.  Murray Hodgkins, NP 03/10/2019, 4:25 PM

## 2019-03-10 NOTE — Patient Instructions (Signed)
Medication Instructions:  None ordered  If you need a refill on your cardiac medications before your next appointment, please call your pharmacy.   Lab work: Your physician recommends that you return for lab work TODAY at the medical mall. No appt is needed. Hours are M-F 7AM- 6 PM.  If you have labs (blood work) drawn today and your tests are completely normal, you will receive your results only by: Marland Kitchen MyChart Message (if you have MyChart) OR . A paper copy in the mail If you have any lab test that is abnormal or we need to change your treatment, we will call you to review the results.  Testing/Procedures: None ordered   Follow-Up: At [facility], you and your health needs are our priority.  As part of our continuing mission to provide you with exceptional heart care, we have created designated Provider Care Teams.  These Care Teams include your primary Cardiologist (physician) and Advanced Practice Providers (APPs -  Physician Assistants and Nurse Practitioners) who all work together to provide you with the care you need, when you need it. You will need a follow up appointment in 2-3 months.  You may see Ida Rogue, MD or Murray Hodgkins, NP.

## 2019-03-11 ENCOUNTER — Encounter: Payer: Self-pay | Admitting: Neurology

## 2019-03-11 ENCOUNTER — Telehealth: Payer: Self-pay

## 2019-03-11 ENCOUNTER — Ambulatory Visit (INDEPENDENT_AMBULATORY_CARE_PROVIDER_SITE_OTHER): Payer: Medicare Other | Admitting: Neurology

## 2019-03-11 VITALS — BP 165/72 | HR 69 | Temp 97.1°F | Ht 61.0 in | Wt 109.0 lb

## 2019-03-11 DIAGNOSIS — I69398 Other sequelae of cerebral infarction: Secondary | ICD-10-CM | POA: Diagnosis not present

## 2019-03-11 DIAGNOSIS — I6310 Cerebral infarction due to embolism of unspecified precerebral artery: Secondary | ICD-10-CM | POA: Diagnosis not present

## 2019-03-11 DIAGNOSIS — N182 Chronic kidney disease, stage 2 (mild): Secondary | ICD-10-CM | POA: Diagnosis not present

## 2019-03-11 DIAGNOSIS — E1122 Type 2 diabetes mellitus with diabetic chronic kidney disease: Secondary | ICD-10-CM | POA: Diagnosis not present

## 2019-03-11 DIAGNOSIS — R29898 Other symptoms and signs involving the musculoskeletal system: Secondary | ICD-10-CM | POA: Diagnosis not present

## 2019-03-11 DIAGNOSIS — I129 Hypertensive chronic kidney disease with stage 1 through stage 4 chronic kidney disease, or unspecified chronic kidney disease: Secondary | ICD-10-CM | POA: Diagnosis not present

## 2019-03-11 DIAGNOSIS — W19XXXS Unspecified fall, sequela: Secondary | ICD-10-CM

## 2019-03-11 NOTE — Patient Instructions (Addendum)
I had a long d/w patient and her husband about her recent likely incidental small stroke following cardiac catheterization and her fall which is likely unrelated, risk for recurrent stroke/TIAs, personally independently reviewed imaging studies and stroke evaluation results and answered questions.Continue Aggrenox for secondary stroke prevention and maintain strict control of hypertension with blood pressure goal below 130/90, diabetes with hemoglobin A1c goal below 6.5% and lipids with LDL cholesterol goal below 70 mg/dL. I also advised the patient to eat a healthy diet with plenty of whole grains, cereals, fruits and vegetables, exercise regularly and maintain ideal body weight.  I recommend we check EEG for seizure activity.  Continue home physical occupational therapy and to use a walker while walking long distances and outdoors for fall and safety.  Follow up in the future with my nurse practitioner Janett Billow in 3 months or call earlier if necessary.  Stroke Prevention Some medical conditions and behaviors are associated with a higher chance of having a stroke. You can help prevent a stroke by making nutrition, lifestyle, and other changes, including managing any medical conditions you may have. What nutrition changes can be made?   Eat healthy foods. You can do this by: ? Choosing foods high in fiber, such as fresh fruits and vegetables and whole grains. ? Eating at least 5 or more servings of fruits and vegetables a day. Try to fill half of your plate at each meal with fruits and vegetables. ? Choosing lean protein foods, such as lean cuts of meat, poultry without skin, fish, tofu, beans, and nuts. ? Eating low-fat dairy products. ? Avoiding foods that are high in salt (sodium). This can help lower blood pressure. ? Avoiding foods that have saturated fat, trans fat, and cholesterol. This can help prevent high cholesterol. ? Avoiding processed and premade foods.  Follow your health care  provider's specific guidelines for losing weight, controlling high blood pressure (hypertension), lowering high cholesterol, and managing diabetes. These may include: ? Reducing your daily calorie intake. ? Limiting your daily sodium intake to 1,500 milligrams (mg). ? Using only healthy fats for cooking, such as olive oil, canola oil, or sunflower oil. ? Counting your daily carbohydrate intake. What lifestyle changes can be made?  Maintain a healthy weight. Talk to your health care provider about your ideal weight.  Get at least 30 minutes of moderate physical activity at least 5 days a week. Moderate activity includes brisk walking, biking, and swimming.  Do not use any products that contain nicotine or tobacco, such as cigarettes and e-cigarettes. If you need help quitting, ask your health care provider. It may also be helpful to avoid exposure to secondhand smoke.  Limit alcohol intake to no more than 1 drink a day for nonpregnant women and 2 drinks a day for men. One drink equals 12 oz of beer, 5 oz of wine, or 1 oz of hard liquor.  Stop any illegal drug use.  Avoid taking birth control pills. Talk to your health care provider about the risks of taking birth control pills if: ? You are over 38 years old. ? You smoke. ? You get migraines. ? You have ever had a blood clot. What other changes can be made?  Manage your cholesterol levels. ? Eating a healthy diet is important for preventing high cholesterol. If cholesterol cannot be managed through diet alone, you may also need to take medicines. ? Take any prescribed medicines to control your cholesterol as told by your health care provider.  Manage your  diabetes. ? Eating a healthy diet and exercising regularly are important parts of managing your blood sugar. If your blood sugar cannot be managed through diet and exercise, you may need to take medicines. ? Take any prescribed medicines to control your diabetes as told by your health  care provider.  Control your hypertension. ? To reduce your risk of stroke, try to keep your blood pressure below 130/80. ? Eating a healthy diet and exercising regularly are an important part of controlling your blood pressure. If your blood pressure cannot be managed through diet and exercise, you may need to take medicines. ? Take any prescribed medicines to control hypertension as told by your health care provider. ? Ask your health care provider if you should monitor your blood pressure at home. ? Have your blood pressure checked every year, even if your blood pressure is normal. Blood pressure increases with age and some medical conditions.  Get evaluated for sleep disorders (sleep apnea). Talk to your health care provider about getting a sleep evaluation if you snore a lot or have excessive sleepiness.  Take over-the-counter and prescription medicines only as told by your health care provider. Aspirin or blood thinners (antiplatelets or anticoagulants) may be recommended to reduce your risk of forming blood clots that can lead to stroke.  Make sure that any other medical conditions you have, such as atrial fibrillation or atherosclerosis, are managed. What are the warning signs of a stroke? The warning signs of a stroke can be easily remembered as BEFAST.  B is for balance. Signs include: ? Dizziness. ? Loss of balance or coordination. ? Sudden trouble walking.  E is for eyes. Signs include: ? A sudden change in vision. ? Trouble seeing.  F is for face. Signs include: ? Sudden weakness or numbness of the face. ? The face or eyelid drooping to one side.  A is for arms. Signs include: ? Sudden weakness or numbness of the arm, usually on one side of the body.  S is for speech. Signs include: ? Trouble speaking (aphasia). ? Trouble understanding.  T is for time. ? These symptoms may represent a serious problem that is an emergency. Do not wait to see if the symptoms will go  away. Get medical help right away. Call your local emergency services (911 in the [place] Do not drive yourself to the hospital.  Other signs of stroke may include: ? A sudden, severe headache with no known cause. ? Nausea or vomiting. ? Seizure. Where to find more information For more information, visit:  American Stroke Association: www.strokeassociation.org  National Stroke Association: www.stroke.org Summary  You can prevent a stroke by eating healthy, exercising, not smoking, limiting alcohol intake, and managing any medical conditions you may have.  Do not use any products that contain nicotine or tobacco, such as cigarettes and e-cigarettes. If you need help quitting, ask your health care provider. It may also be helpful to avoid exposure to secondhand smoke.  Remember BEFAST for warning signs of stroke. Get help right away if you or a loved one has any of these signs. This information is not intended to replace advice given to you by your health care provider. Make sure you discuss any questions you have with your health care provider. Document Released: 08/08/2004 Document Revised: 06/13/2017 Document Reviewed: 08/06/2016 Elsevier Patient Education  2020 [employer].

## 2019-03-11 NOTE — Progress Notes (Signed)
[facility] [address] [place]. [place] [address] [phone]       OFFICE CONSULT NOTE  [name] Date of Birth:  [birth year] Medical Record Number:  [id number]   Referring MD: Loura Pardon  Reason for Referral: Stroke  HPI: [name] is a 85 year old pleasant Caucasian lady seen today for initial office consultation visit for stroke.  History is obtained from the patient and her husband as well as review of electronic medical records.  I personally reviewed imaging films in PACS.  [name] is a 60 year old Caucasian lady with past medical history of hypertension, diabetes, coronary artery disease status post stent, chronic anemia was seen for initial office consultation visit for stroke.  History is obtained from the patient and her husband as well as review of electronic medical records and I personally reviewed imaging films in PACS.  Patient had cardiac catheterization by Dr. Mariea Clonts and was discharged home on 02/17/2019.  The next day the husband noticed that she had a fall at home and she was found to be slightly confused after that.  States she was quite aware as to why she fell and she was awake and she tripped on something that led to the fall.  She did not lose consciousness.  But husband felt she was little bit confused and could not state the name of the president and he called EMS.  EMS noticed left facial droop but this may have been chronic.  She was seen in the ED where CT scan was unremarkable and subsequently MRI scan of the brain was obtained which showed a small tiny punctate infarct in the left posterior temporal white matter.  She was also found to have mild hyponatremia with serum sodium of 130.  She was admitted to the medical service for work-up.  Her tiny left temporal infarct was felt to be incidental and likely related to the cardiac cath a few days ago not responsible for her fall presentation.  Carotid ultrasound showed 40 to 59% right ICA  stenosis and left side showed no significant stenosis.  2D echo showed ejection fraction of 45 to 50% with severe hypokinesis but no cardiac source of embolism.  LDL cholesterol 50 mg percent.  Hemoglobin A1c was 6.4. Patient was on Aggrenox prior to admission which was continued.  She had discharged home with home physical and occupational therapy.  Patient states that she is done well and is progressing with therapy.  She can walk short distances by herself indoors and uses a walker for long distances and outdoors.  She states her blood pressure is well controlled at home though today it is elevated 165/72 she blames it on not having taken her medicine today.  She had a brief episode 10 days ago where she felt terrible and was not doing well she felt heavy and had a headache and feels this may have been related to having not slept well for 3 nights.  She is doing better now.  She has no prior history of strokes TIAs seizures loss of consciousness or significant head injury.  She is tolerating  Aggrenox well without headaches or side effects and Lipitor without muscle aches and pains.  She states her sugars are under good control.  She has no new complaints today ROS:   14 system review of systems is positive for memory difficulty, gait imbalance, fall and all other systems negative  PMH:  Past Medical History:  Diagnosis Date   Allergy  allergic rhinitis   Anxiety    Arthritis    osteoarthritis   CAD (coronary artery disease)    a. s/p prior stenting of the LAD and D1 (02/2004); b. 02/2019 NSTEMI in setting of PSVT/Cath: >M 95m/d, LAD 40ost/p/m, D1 60, LCX 84m, 50d, RCA  nl-->Med Rx.   Carotid arterial disease ([place])    a. 02/2019 Carotid U/S: RICA 123XX123, LICA XX123456. Antegrade bilat vert flow.   Degenerative disk disease    in neck   Diabetes mellitus    type II   Fracture of femoral neck, left ([place]) 01/21/2018   Hyperlipidemia    Hypertension    Hypoaldosteronism ([place]) 6/12   from  DM causing hyperkalemia    Hyponatremia    a. 02/2019 in setting of chlorthalidone rx (d/c'd).   Ischemic cardiomyopathy    a. 11/2016 Echo: EF 55-60%, Gr1 DD; b. 02/2019 Echo: EF 45-50%, sev basal-mid infsept, inf, inflat HK. DD. Sev dil LA.   Osteopenia    Periodontal disease    Peripheral vascular disease (HCC)    PSVT (paroxysmal supraventricular tachycardia) ([place])    a. 02/2019 - in absence of BB therapy-->broke w/ adenosine.   Stroke [facility])    a. 02/2019 MRI: L temporal infarct (1 day following cardiac cath).   Transient cerebral ischemia 11/06/2016    Social History:  Social History   Socioeconomic History   Marital status: Married    Spouse name: Stephanie [name]   Number of children: 3   Years of education: Not on file   Highest education level: Not on file  Occupational History   Occupation: retired   Scientist, product/process development strain: Not hard at [employer] insecurity    Worry: Never true    Inability: Never true   Transportation needs    Medical: No    Non-medical: No  Tobacco Use   Smoking status: Never Smoker   Smokeless tobacco: Never Used  Substance and Sexual Activity   Alcohol use: No    Alcohol/week: 0.0 standard drinks   Drug use: No   Sexual activity: Not on file  Lifestyle   Physical activity    Days per week: 0 days    Minutes per session: 0 min   Stress: Not at all  Relationships   Social connections    Talks on phone: More than three times a week    Gets together: More than three times a week    Attends religious service: More than 4 times per year    Active member of club or organization: Yes    Attends meetings of clubs or organizations: 1 to 4 times per year    Relationship status: Married   Intimate partner violence    Fear of current or ex partner: Not on file    Emotionally abused: Not on file    Physically abused: Not on file    Forced sexual activity: Not on file  Other Topics Concern   Not on file  Social  History Narrative   Mrs Stephanie [name] is a 67 year old retired female living at home with her husband, Stephanie [name] and her dog. She voices being independent with ADLs and independent/assist with iADLs She has her husband to assist with transportation to medical appointments       Medications:   Current Outpatient Medications on File Prior to Visit  Medication Sig Dispense Refill   atorvastatin (LIPITOR) 20 MG tablet Take 1 tablet (20 mg total) by mouth daily.  90 tablet 0   dipyridamole-aspirin (AGGRENOX) 200-25 MG 12hr capsule Take 1 capsule by mouth twice daily (Patient taking differently: Take 1 capsule by mouth 2 (two) times daily. ) 60 capsule 11   famotidine (PEPCID) 40 MG tablet Take 1 tablet (40 mg total) by mouth daily. (Patient taking differently: Take 40 mg by mouth See admin instructions. Take 40 mg by mouth one to two times a day) 30 tablet 3   glipiZIDE (GLIPIZIDE XL) 10 MG 24 hr tablet TAKE 1 TABLET BY MOUTH ONCE DAILY WITH BREAKFAST (Patient taking differently: Take 10 mg by mouth daily with breakfast. ) 90 tablet 3   hydrALAZINE (APRESOLINE) 50 MG tablet Take 1 tablet (50 mg total) by mouth every 8 (eight) hours. 90 tablet 2   metFORMIN (GLUCOPHAGE) 1000 MG tablet Take 1 tablet (1,000 mg total) by mouth 2 (two) times daily with a meal. 180 tablet 0   metoprolol succinate (TOPROL-XL) 100 MG 24 hr tablet TAKE 1 TABLET BY MOUTH ONCE DAILY TAKE  WITH  OR  IMMEDIATELY  FOLLOWING  A  MEAL 90 tablet 1   nitroGLYCERIN (NITROSTAT) 0.4 MG SL tablet Place 1 tablet (0.4 mg total) under the tongue every 5 (five) minutes x 3 doses as needed for chest pain. 25 tablet 1   PARoxetine (PAXIL) 40 MG tablet Take 1 tablet by mouth daily (Patient taking differently: Take 40 mg by mouth at bedtime. ) 90 tablet 1   No current facility-administered medications on file prior to visit.     Allergies:   Allergies  Allergen Reactions   Ace Inhibitors Other (See Comments)    Chest pain   Amlodipine  Besy-Benazepril Hcl Other (See Comments)    Chest pain   Clopidogrel Bisulfate Other (See Comments)    GI upset   Erythromycin Other (See Comments)    GI upset   Tape Other (See Comments)    Causes soreness (of the skin)    Physical Exam General: Frail elderly Caucasian lady, seated, in no evident distress Head: head normocephalic and atraumatic.   Neck: supple with no carotid or supraclavicular bruits Cardiovascular: regular rate and rhythm, no murmurs Musculoskeletal: no deformity except mild kyphoscoliosis.  Need controlled chordoma Skin:  no rash/petichiae Vascular:  Normal pulses all extremities  Neurologic Exam Mental Status: Awake and fully alert. Oriented to place and time. Recent and remote memory mildly impaired attention span, concentration and fund of knowledge appropriate. Mood and affect appropriate.  Diminished recall 2/3. Cranial Nerves: Fundoscopic exam reveals sharp disc margins. Pupils equal, briskly reactive to light. Extraocular movements full without nystagmus. Visual fields full to confrontation. Hearing intact. Facial sensation intact. Face, tongue, palate moves normally and symmetrically.  Motor: Normal bulk and tone. Normal strength in all tested extremity muscles. Sensory.: intact to touch , pinprick , position and vibratory sensation.  Coordination: Rapid alternating movements normal in all extremities. Finger-to-nose and heel-to-shin performed accurately bilaterally. Gait and Station: Arises from chair without difficulty. Stance is slightly stooped.  She uses a walker.. Gait demonstrates normal stride length and balance . Able to heel, toe and tandem walk with moderate  difficulty.  Reflexes: 1+ and symmetric. Toes downgoing.   NIHSS  0 Modified Rankin  1   ASSESSMENT: 85 year old Caucasian lady with incidental small left temporal infarct and August 2020 following cardiac catheterization.  She also presented with of unwitnessed fall at home which is  likely incidental.     PLAN: I had a long d/w patient and her husband  about her recent likely incidental small stroke following cardiac catheterization and her fall which is likely unrelated, risk for recurrent stroke/TIAs, personally independently reviewed imaging studies and stroke evaluation results and answered questions.Continue Aggrenox for secondary stroke prevention and maintain strict control of hypertension with blood pressure goal below 130/90, diabetes with hemoglobin A1c goal below 6.5% and lipids with LDL cholesterol goal below 70 mg/dL. I also advised the patient to eat a healthy diet with plenty of whole grains, cereals, fruits and vegetables, exercise regularly and maintain ideal body weight.  I recommend we check EEG for seizure activity.  Continue home physical occupational therapy and to use a walker while walking long distances and outdoors for fall and safety.  Greater than 50% time during this 45-minute consultation visit was spent on counseling and coordination of care about her incidental stroke and fall and answering questions follow up in the future with my nurse practitioner Stephanie [name] in 3 months or call earlier if necessary. Antony Contras, MD  [facility] [address] [place], [place] [address]  Phone [phone] Fax [phone] Note: This document was prepared with digital dictation and possible smart phrase technology. Any transcriptional errors that result from this process are unintentional.

## 2019-03-11 NOTE — Telephone Encounter (Signed)
Left detailed message with lab results. No new orders.   Advised pt to call for any further questions or concerns.

## 2019-03-11 NOTE — Telephone Encounter (Signed)
-----   Message from Theora Gianotti, NP sent at 03/10/2019  4:52 PM EDT ----- Na now normal off of chlorthalidone.  Kidney fxn relatively stable.

## 2019-03-15 ENCOUNTER — Other Ambulatory Visit: Payer: Self-pay | Admitting: Family Medicine

## 2019-03-17 DIAGNOSIS — E1122 Type 2 diabetes mellitus with diabetic chronic kidney disease: Secondary | ICD-10-CM | POA: Diagnosis not present

## 2019-03-17 DIAGNOSIS — I129 Hypertensive chronic kidney disease with stage 1 through stage 4 chronic kidney disease, or unspecified chronic kidney disease: Secondary | ICD-10-CM | POA: Diagnosis not present

## 2019-03-17 DIAGNOSIS — I69398 Other sequelae of cerebral infarction: Secondary | ICD-10-CM | POA: Diagnosis not present

## 2019-03-17 DIAGNOSIS — N182 Chronic kidney disease, stage 2 (mild): Secondary | ICD-10-CM | POA: Diagnosis not present

## 2019-03-17 DIAGNOSIS — R29898 Other symptoms and signs involving the musculoskeletal system: Secondary | ICD-10-CM | POA: Diagnosis not present

## 2019-03-24 DIAGNOSIS — R29898 Other symptoms and signs involving the musculoskeletal system: Secondary | ICD-10-CM | POA: Diagnosis not present

## 2019-03-24 DIAGNOSIS — I129 Hypertensive chronic kidney disease with stage 1 through stage 4 chronic kidney disease, or unspecified chronic kidney disease: Secondary | ICD-10-CM | POA: Diagnosis not present

## 2019-03-24 DIAGNOSIS — N182 Chronic kidney disease, stage 2 (mild): Secondary | ICD-10-CM | POA: Diagnosis not present

## 2019-03-24 DIAGNOSIS — I69398 Other sequelae of cerebral infarction: Secondary | ICD-10-CM | POA: Diagnosis not present

## 2019-03-24 DIAGNOSIS — E1122 Type 2 diabetes mellitus with diabetic chronic kidney disease: Secondary | ICD-10-CM | POA: Diagnosis not present

## 2019-03-26 ENCOUNTER — Other Ambulatory Visit: Payer: Self-pay

## 2019-03-26 ENCOUNTER — Ambulatory Visit (INDEPENDENT_AMBULATORY_CARE_PROVIDER_SITE_OTHER): Payer: Medicare Other | Admitting: Family Medicine

## 2019-03-26 ENCOUNTER — Encounter: Payer: Self-pay | Admitting: Family Medicine

## 2019-03-26 VITALS — BP 139/60 | HR 67 | Temp 96.5°F | Ht 61.5 in | Wt 109.1 lb

## 2019-03-26 DIAGNOSIS — E11319 Type 2 diabetes mellitus with unspecified diabetic retinopathy without macular edema: Secondary | ICD-10-CM

## 2019-03-26 DIAGNOSIS — E782 Mixed hyperlipidemia: Secondary | ICD-10-CM

## 2019-03-26 DIAGNOSIS — I10 Essential (primary) hypertension: Secondary | ICD-10-CM | POA: Diagnosis not present

## 2019-03-26 DIAGNOSIS — Z0000 Encounter for general adult medical examination without abnormal findings: Secondary | ICD-10-CM | POA: Diagnosis not present

## 2019-03-26 DIAGNOSIS — E1169 Type 2 diabetes mellitus with other specified complication: Secondary | ICD-10-CM

## 2019-03-26 DIAGNOSIS — E2839 Other primary ovarian failure: Secondary | ICD-10-CM | POA: Insufficient documentation

## 2019-03-26 DIAGNOSIS — E1351 Other specified diabetes mellitus with diabetic peripheral angiopathy without gangrene: Secondary | ICD-10-CM | POA: Diagnosis not present

## 2019-03-26 DIAGNOSIS — M8580 Other specified disorders of bone density and structure, unspecified site: Secondary | ICD-10-CM

## 2019-03-26 DIAGNOSIS — E7800 Pure hypercholesterolemia, unspecified: Secondary | ICD-10-CM

## 2019-03-26 DIAGNOSIS — E441 Mild protein-calorie malnutrition: Secondary | ICD-10-CM

## 2019-03-26 DIAGNOSIS — E559 Vitamin D deficiency, unspecified: Secondary | ICD-10-CM

## 2019-03-26 DIAGNOSIS — Z9181 History of falling: Secondary | ICD-10-CM

## 2019-03-26 DIAGNOSIS — D649 Anemia, unspecified: Secondary | ICD-10-CM

## 2019-03-26 DIAGNOSIS — N182 Chronic kidney disease, stage 2 (mild): Secondary | ICD-10-CM

## 2019-03-26 MED ORDER — PAROXETINE HCL 40 MG PO TABS: 40.0000 mg | ORAL_TABLET | Freq: Every day | ORAL | 3 refills | Status: AC

## 2019-03-26 NOTE — Patient Instructions (Addendum)
Get a new glucometer when you can  I will need to know the exact type of meter and equipment you need to send in the order   If you are interested in the new shingles vaccine (Shingrix) - call your local pharmacy to check on coverage and availability  If affordable, get on a wait list at your pharmacy to get the vaccine.    I want you to get 4000 iu of vitamin D3 over the counter each day Put it in your pill box so you do not forget  Also get vitamin B12 over the counter and take 1000 mcg daily    Stop at check out and we will refer you for a bone density test   You need an eye exam  We will do referral and our office will call you

## 2019-03-26 NOTE — Progress Notes (Signed)
Subjective:    Patient ID: Stephanie [name], female    DOB: [birth year], 85 y.o.   MRN: [id number]  HPI Pt presents for amw and routine health mt exam with rev of chronic medical problems  I have personally reviewed the Medicare Annual Wellness questionnaire and have noted 1. The patient's medical and social history 2. Their use of alcohol, tobacco or illicit drugs 3. Their current medications and supplements 4. The patient's functional ability including ADL's, fall risks, home safety risks and hearing or visual             impairment. 5. Diet and physical activities 6. Evidence for depression or mood disorders  The patients weight, height, BMI have been recorded in the chart and visual acuity is per eye clinic.  I have made referrals, counseling and provided education to the patient based review of the above and I have provided the pt with a written personalized care plan for preventive services. Reviewed and updated provider list, see scanned forms.  See scanned forms.  Routine anticipatory guidance given to patient.  See health maintenance. Colon cancer screening colonoscopy 10/19 with polyps removed No recall due to adv age  Breast cancer screening  -mammogram 12/15- she declines further mammograms  Self breast exam-no lumps or changes  Flu vaccine- (has flu shot clinic appt next wk with her husband) Tetanus vaccine Td 6/07- no injuries/ins does not pay Pneumovax-declined in the past - bad rxn from last pna 23 Zoster vaccine-unsure if interested /had shingles many years ago  Dexa 12/15 osteopenia LFN Falls-one recent w/o injury- PT is coming and working with her since CVA Fractures- hip fx  Supplements- none  Last D level 24.6 Exercise - PT with her walker   Advance directive-has living will and poa -is up to date  Cognitive function addressed- see scanned forms- and if abnormal then additional documentation follows.  Once in a while memory is delayed (short term) - things  come to her in a bit  No confusion  Recovering from a stroke   PMH and SH reviewed  Meds, vitals, and allergies reviewed.   ROS: See HPI.  Otherwise negative.    Weight : Wt Readings from Last 3 Encounters:  03/26/19 109 lb 2 oz (49.5 kg)  03/11/19 109 lb (49.4 kg)  03/10/19 109 lb (49.4 kg)  appetite is good  20.29 kg/m    Hearing/vision: She does not use hearing aides  No problems  Vision blurred-needs eye exam so ref done  No exam data present   Eye exam -is overdue  Vision is more blurred   Depression screen PHQ:0  H/o CAD and CVA with carotid stenosis  Also PVD (all in setting of DM2)  bp is stable today  No cp or palpitations or headaches or edema  No side effects to medicines  BP Readings from Last 3 Encounters:  03/26/19 (!) 142/60  03/11/19 (!) 165/72  03/10/19 128/68    bp is 120s/60 at home  Re check 139/60    Lab Results  Component Value Date   CREATININE 1.18 (H) 03/10/2019   BUN 30 (H) 03/10/2019   NA 135 03/10/2019   K 4.2 03/10/2019   CL 101 03/10/2019   CO2 23 03/10/2019  has  CKD 2  Had low na until stopping chlorthalidone  She was started on hydralazine (cardiology)     Lab Results  Component Value Date   ALT 11 02/18/2019   AST 22 02/18/2019   ALKPHOS  45 02/18/2019   BILITOT 0.5 02/18/2019   Lab Results  Component Value Date   WBC 9.7 02/22/2019   HGB 10.2 (L) 02/22/2019   HCT 30.3 (L) 02/22/2019   MCV 93.7 02/22/2019   PLT 271.0 02/22/2019  stable chronic anemia   Lab Results  Component Value Date   TSH 1.406 02/16/2019    DM2 Lab Results  Component Value Date   HGBA1C 6.4 (H) 02/17/2019  with retinopathy  She had been eating cookies and ice cream  Has since stopped   Well controlled  Last visit enc to start checking glucose and avoid sweets -no meter yet  Hyperlipidemia Lab Results  Component Value Date   CHOL 115 02/18/2019   CHOL 116 02/16/2019   CHOL 121 08/18/2018   Lab Results  Component Value  Date   HDL 35 (L) 02/18/2019   HDL 39 (L) 02/16/2019   HDL 35.60 (L) 08/18/2018   Lab Results  Component Value Date   LDLCALC 50 02/18/2019   LDLCALC 25 02/16/2019   LDLCALC 52 08/18/2018   Lab Results  Component Value Date   TRIG 149 02/18/2019   TRIG 259 (H) 02/16/2019   TRIG 167.0 (H) 08/18/2018   Lab Results  Component Value Date   CHOLHDL 3.3 02/18/2019   CHOLHDL 3.0 02/16/2019   CHOLHDL 3 08/18/2018   Lab Results  Component Value Date   LDLDIRECT 54.0 09/04/2015   LDLDIRECT 43.0 02/28/2015   LDLDIRECT 59.0 08/23/2014  atorvastatin 20   H/o low B12 Lab Results  Component Value Date   VITAMINB12 58 (L) 06/06/2018    Patient Active Problem List   Diagnosis Date Noted   Estrogen deficiency 03/26/2019   Demand ischemia ([place]) 02/17/2019   Chest pain 02/17/2019   Acute CVA (cerebrovascular accident) ([place]) 02/17/2019   Non-ST elevation (NSTEMI) myocardial infarction ([place]) 02/16/2019   NSTEMI (non-ST elevated myocardial infarction) ([place])    Colonic diverticular abscess 06/05/2018   Anemia 06/05/2018   Protein calorie malnutrition ([place]) 06/05/2018   Closed fracture of left hip (HCC)    Chronic kidney disease (CKD), stage II (mild) 01/22/2018   Fracture of femoral neck, left ([place]) 01/21/2018   GERD (gastroesophageal reflux disease) 01/21/2018   CAD (coronary artery disease) 01/21/2018   Subcapital fracture of left hip ([place]) 01/21/2018   Carotid stenosis, bilateral 11/20/2016   Transient cerebral ischemia 11/06/2016   Vitamin D deficiency 09/04/2015   Routine general medical examination at a health care facility 09/04/2015   Encounter for Medicare annual wellness exam 05/30/2014   Colon cancer screening 05/30/2014   History of fall 05/26/2013   Gynecological examination [id number]   HELICOBACTER PYLORI INFECTION 12/19/2006   HSV 12/19/2006   Type 2 diabetes mellitus ([place]) 12/19/2006   Diabetic retinopathy ([place]) 12/19/2006    Combined hyperlipidemia associated with type 2 diabetes mellitus ([place]) 12/19/2006   ANXIETY 12/19/2006   HTN (hypertension) 12/19/2006   Coronary atherosclerosis 12/19/2006   Peripheral vascular disease due to secondary diabetes mellitus ([place]) 12/19/2006   ALLERGIC RHINITIS 12/19/2006   OSTEOARTHRITIS 12/19/2006   Osteopenia 12/19/2006   Past Medical History:  Diagnosis Date   Allergy    allergic rhinitis   Anxiety    Arthritis    osteoarthritis   CAD (coronary artery disease)    a. s/p prior stenting of the LAD and D1 (02/2004); b. 02/2019 NSTEMI in setting of PSVT/Cath: >M 64m/d, LAD 40ost/p/m, D1 60, LCX 2m, 50d, RCA  nl-->Med Rx.   Carotid arterial disease ([place])  a. 02/2019 Carotid U/S: RICA 123XX123, LICA [id number]. Antegrade bilat vert flow.   Degenerative disk disease    in neck   Diabetes mellitus    type II   Fracture of femoral neck, left ([place]) 01/21/2018   Hyperlipidemia    Hypertension    Hypoaldosteronism ([place]) 6/12   from DM causing hyperkalemia    Hyponatremia    a. 02/2019 in setting of chlorthalidone rx (d/c'd).   Ischemic cardiomyopathy    a. 11/2016 Echo: EF 55-60%, Gr1 DD; b. 02/2019 Echo: EF 45-50%, sev basal-mid infsept, inf, inflat HK. DD. Sev dil LA.   Osteopenia    Periodontal disease    Peripheral vascular disease (HCC)    PSVT (paroxysmal supraventricular tachycardia) ([place])    a. 02/2019 - in absence of BB therapy-->broke w/ adenosine.   Stroke [facility])    a. 02/2019 MRI: L temporal infarct (1 day following cardiac cath).   Transient cerebral ischemia 11/06/2016   Past Surgical History:  Procedure Laterality Date   CARDIAC CATHETERIZATION     x2 STENT   CHOLECYSTECTOMY     ESOPHAGOGASTRODUODENOSCOPY  03/2004   gastritis by biopsy   GUM SURGERY  06/2010   laser surgery for gums   HIP ARTHROPLASTY Left 01/21/2018   Procedure: ARTHROPLASTY BIPOLAR HIP (HEMIARTHROPLASTY);  Surgeon: Marchia Bond, MD;  Location: [facility];  Service:  Orthopedics;  Laterality: Left;   IR RADIOLOGIST EVAL & MGMT  06/17/2018   LEFT HEART CATH AND CORONARY ANGIOGRAPHY N/A 02/16/2019   Procedure: LEFT HEART CATH AND CORONARY ANGIOGRAPHY;  Surgeon: Wellington Hampshire, MD;  Location: [facility];  Service: Cardiovascular;  Laterality: N/A;   MM BREAST STEREO BX*L*R/S  12/12   normal   TONSILLECTOMY     TRANSTHORACIC ECHOCARDIOGRAM  11/2016   EF 55-60%, grade 1 DD with elevated LV end diastolic filling pressures, mildly dilated LA, mild TR and PR.    TUBAL LIGATION     Social History   Tobacco Use   Smoking status: Never Smoker   Smokeless tobacco: Never Used  Substance Use Topics   Alcohol use: No    Alcohol/week: 0.0 standard drinks   Drug use: No   Family History  Problem Relation Age of Onset   Cancer Mother        vaginal CA in situ   Hypertension Mother    Heart disease Mother        CAD and MI   Heart disease Father        CAD   Diabetes Father    Heart disease Son        congenital valve problem   Allergies  Allergen Reactions   Ace Inhibitors Other (See Comments)    Chest pain   Amlodipine Besy-Benazepril Hcl Other (See Comments)    Chest pain   Clopidogrel Bisulfate Other (See Comments)    GI upset   Erythromycin Other (See Comments)    GI upset   Pneumovax 23 [Pneumococcal Vac Polyvalent]     Local reaction    Tape Other (See Comments)    Causes soreness (of the skin)   Current Outpatient Medications on File Prior to Visit  Medication Sig Dispense Refill   atorvastatin (LIPITOR) 20 MG tablet Take 1 tablet (20 mg total) by mouth daily. 90 tablet 0   dipyridamole-aspirin (AGGRENOX) 200-25 MG 12hr capsule Take 1 capsule by mouth twice daily (Patient taking differently: Take 1 capsule by mouth 2 (two) times daily. ) 60 capsule 11  famotidine (PEPCID) 40 MG tablet Take 1 tablet (40 mg total) by mouth daily. (Patient taking differently: Take 40 mg by mouth See admin instructions. Take  40 mg by mouth one to two times a day) 30 tablet 3   glipiZIDE (GLUCOTROL XL) 10 MG 24 hr tablet Take 1 tablet by mouth once daily with breakfast 90 tablet 1   hydrALAZINE (APRESOLINE) 50 MG tablet Take 1 tablet (50 mg total) by mouth every 8 (eight) hours. (Patient taking differently: Take 50 mg by mouth 2 (two) times daily. ) 90 tablet 2   metFORMIN (GLUCOPHAGE) 1000 MG tablet Take 1 tablet (1,000 mg total) by mouth 2 (two) times daily with a meal. 180 tablet 0   metoprolol succinate (TOPROL-XL) 100 MG 24 hr tablet TAKE 1 TABLET BY MOUTH ONCE DAILY TAKE  WITH  OR  IMMEDIATELY  FOLLOWING  A  MEAL 90 tablet 1   nitroGLYCERIN (NITROSTAT) 0.4 MG SL tablet Place 1 tablet (0.4 mg total) under the tongue every 5 (five) minutes x 3 doses as needed for chest pain. 25 tablet 1   No current facility-administered medications on file prior to visit.     Review of Systems  Constitutional: Negative for activity change, appetite change, fatigue, fever and unexpected weight change.  HENT: Negative for congestion, ear pain, rhinorrhea, sinus pressure and sore throat.   Eyes: Positive for visual disturbance. Negative for pain and redness.  Respiratory: Negative for cough, shortness of breath and wheezing.   Cardiovascular: Negative for chest pain and palpitations.  Gastrointestinal: Negative for abdominal pain, blood in stool, constipation and diarrhea.  Endocrine: Negative for polydipsia and polyuria.  Genitourinary: Negative for dysuria, frequency and urgency.  Musculoskeletal: Negative for arthralgias, back pain and myalgias.  Skin: Negative for pallor and rash.  Allergic/Immunologic: Negative for environmental allergies.  Neurological: Positive for weakness. Negative for dizziness, syncope and headaches.       Generalized weakness imp with PT   Speech is also improving  Hematological: Negative for adenopathy. Does not bruise/bleed easily.  Psychiatric/Behavioral: Negative for decreased  concentration and dysphoric mood. The patient is not nervous/anxious.        Objective:   Physical Exam Constitutional:      General: She is not in acute distress.    Appearance: Normal appearance. She is well-developed and normal weight. She is not ill-appearing or diaphoretic.  HENT:     Head: Normocephalic and atraumatic.     Right Ear: Tympanic membrane, ear canal and external ear normal.     Left Ear: Tympanic membrane, ear canal and external ear normal.     Nose: Nose normal. No congestion.     Mouth/Throat:     Mouth: Mucous membranes are moist.     Pharynx: Oropharynx is clear. No posterior oropharyngeal erythema.  Eyes:     General: No scleral icterus.    Extraocular Movements: Extraocular movements intact.     Conjunctiva/sclera: Conjunctivae normal.     Pupils: Pupils are equal, round, and reactive to light.  Neck:     Musculoskeletal: Normal range of motion and neck supple. No neck rigidity or muscular tenderness.     Thyroid: No thyromegaly.     Vascular: No carotid bruit or JVD.  Cardiovascular:     Rate and Rhythm: Normal rate and regular rhythm.     Pulses: Normal pulses.     Heart sounds: Normal heart sounds. No gallop.   Pulmonary:     Effort: Pulmonary effort is normal. No  respiratory distress.     Breath sounds: Normal breath sounds. No wheezing.     Comments: Good air exch Chest:     Chest wall: No tenderness.  Abdominal:     General: Bowel sounds are normal. There is no distension or abdominal bruit.     Palpations: Abdomen is soft. There is no mass.     Tenderness: There is no abdominal tenderness.     Hernia: No hernia is present.  Genitourinary:    Comments: Breast exam: No mass, nodules, thickening, tenderness, bulging, retraction, inflamation, nipple discharge or skin changes noted.  No axillary or clavicular LA.     Exam done sitting Musculoskeletal: Normal range of motion.        General: No tenderness.     Right lower leg: No edema.      Left lower leg: No edema.  Lymphadenopathy:     Cervical: No cervical adenopathy.  Skin:    General: Skin is warm and dry.     Coloration: Skin is not pale.     Findings: No erythema or rash.     Comments: Fair complexion   Neurological:     Mental Status: She is alert. Mental status is at baseline.     Cranial Nerves: No cranial nerve deficit.     Motor: No abnormal muscle tone.     Coordination: Coordination normal.     Gait: Gait normal.     Deep Tendon Reflexes: Reflexes are normal and symmetric.  Psychiatric:        Mood and Affect: Mood normal.        Cognition and Memory: Cognition and memory normal.          Assessment & Plan:   Problem List Items Addressed This Visit      Cardiovascular and Mediastinum   HTN (hypertension)    bp in fair control at this time  BP Readings from Last 1 Encounters:  03/26/19 139/60   No changes needed Most recent labs reviewed  Disc lifstyle change with low sodium diet and exercise  Tolerating hydralazine      Peripheral vascular disease due to secondary diabetes mellitus ([place])     Endocrine   Type 2 diabetes mellitus ([place])    Lab Results  Component Value Date   HGBA1C 6.4 (H) 02/17/2019   Improved  Recently changed diet (was eating sweets)      Relevant Orders   Ambulatory referral to Ophthalmology   Combined hyperlipidemia associated with type 2 diabetes mellitus ([place])    LDL 50 with atorvastatin  Disc goals for lipids and reasons to control them Rev last labs with pt Rev low sat fat diet in detail         Musculoskeletal and Integument   Osteopenia    dexa ordered Has had a hip fx D is low  Disc fall prev        Genitourinary   Chronic kidney disease (CKD), stage II (mild)    Cr of 1.18 and bun 30 Enc better fluid intake  Na is normalized after stopping chlorthalidone        Other   Anemia (Chronic)    Stable chronic anemia       Diabetic retinopathy ([place])    Diabetes is now better  controlled  Ref for opthy exam done      History of fall    Reviewed fall precautions Doing PT      Encounter for Medicare annual wellness exam -  Primary    Reviewed health habits including diet and exercise and skin cancer prevention Reviewed appropriate screening tests for age  Also reviewed health mt list, fam hx and immunization status , as well as social and family history   See HPI Labs reviewed Pt declines mammograms (enc self exam) Has flu shot planned for next week Declined pna vaccine due to rxn from prev one  dexa ordered (OP) with h/o hip fx and low D Enc to continue PT  Has advance directive  No cognitive concerns Fair hearing and vision- ref done for opthy exam Nl depression screen       Vitamin D deficiency    Low at 24.6 Disc imp to bone and overall health  inst to start 4000 iu daily otc      Routine general medical examination at a health care facility    Reviewed health habits including diet and exercise and skin cancer prevention Reviewed appropriate screening tests for age  Also reviewed health mt list, fam hx and immunization status , as well as social and family history   See HPI Labs reviewed Pt declines mammograms (enc self exam) Has flu shot planned for next week Declined pna vaccine due to rxn from prev one  dexa ordered (OP) with h/o hip fx and low D Enc to continue PT  Has advance directive  No cognitive concerns Fair hearing and vision- ref done for opthy exam Nl depression screen            Protein calorie malnutrition ([place])    Stable wt  Enc protein calories      Estrogen deficiency   Relevant Orders   DG Bone Density    Other Visit Diagnoses    Pure hypercholesterolemia

## 2019-03-28 NOTE — Assessment & Plan Note (Signed)
Diabetes is now better controlled  Ref for opthy exam done

## 2019-03-28 NOTE — Assessment & Plan Note (Signed)
Low at 24.6 Disc imp to bone and overall health  inst to start 4000 iu daily otc

## 2019-03-28 NOTE — Assessment & Plan Note (Signed)
Reviewed fall precautions Doing PT

## 2019-03-28 NOTE — Assessment & Plan Note (Signed)
Stable wt  Enc protein calories

## 2019-03-28 NOTE — Assessment & Plan Note (Signed)
Cr of 1.18 and bun 30 Enc better fluid intake  Na is normalized after stopping chlorthalidone

## 2019-03-28 NOTE — Assessment & Plan Note (Signed)
Stable chronic anemia

## 2019-03-28 NOTE — Assessment & Plan Note (Signed)
dexa ordered Has had a hip fx D is low  Disc fall prev

## 2019-03-28 NOTE — Assessment & Plan Note (Signed)
LDL dow to 50 with atorvastatin  Disc goals for lipids and reasons to control them Rev last labs with pt Rev low sat fat diet in detail

## 2019-03-28 NOTE — Assessment & Plan Note (Signed)
bp in fair control at this time  BP Readings from Last 1 Encounters:  03/26/19 139/60   No changes needed Most recent labs reviewed  Disc lifstyle change with low sodium diet and exercise  Tolerating hydralazine

## 2019-03-28 NOTE — Assessment & Plan Note (Signed)
LDL 50 with atorvastatin  Disc goals for lipids and reasons to control them Rev last labs with pt Rev low sat fat diet in detail

## 2019-03-28 NOTE — Assessment & Plan Note (Signed)
Lab Results  Component Value Date   HGBA1C 6.4 (H) 02/17/2019   Improved  Recently changed diet (was eating sweets)

## 2019-03-28 NOTE — Assessment & Plan Note (Signed)
Reviewed health habits including diet and exercise and skin cancer prevention Reviewed appropriate screening tests for age  Also reviewed health mt list, fam hx and immunization status , as well as social and family history   See HPI Labs reviewed Pt declines mammograms (enc self exam) Has flu shot planned for next week Declined pna vaccine due to rxn from prev one  dexa ordered (OP) with h/o hip fx and low D Enc to continue PT  Has advance directive  No cognitive concerns Fair hearing and vision- ref done for opthy exam Nl depression screen

## 2019-03-29 ENCOUNTER — Encounter: Payer: Self-pay | Admitting: Podiatry

## 2019-03-29 ENCOUNTER — Ambulatory Visit (INDEPENDENT_AMBULATORY_CARE_PROVIDER_SITE_OTHER): Payer: Medicare Other | Admitting: Podiatry

## 2019-03-29 ENCOUNTER — Other Ambulatory Visit: Payer: Self-pay

## 2019-03-29 DIAGNOSIS — B351 Tinea unguium: Secondary | ICD-10-CM

## 2019-03-29 DIAGNOSIS — E1169 Type 2 diabetes mellitus with other specified complication: Secondary | ICD-10-CM

## 2019-03-29 DIAGNOSIS — M79675 Pain in left toe(s): Secondary | ICD-10-CM | POA: Diagnosis not present

## 2019-03-29 DIAGNOSIS — E1142 Type 2 diabetes mellitus with diabetic polyneuropathy: Secondary | ICD-10-CM | POA: Diagnosis not present

## 2019-03-29 DIAGNOSIS — E1351 Other specified diabetes mellitus with diabetic peripheral angiopathy without gangrene: Secondary | ICD-10-CM | POA: Diagnosis not present

## 2019-03-29 DIAGNOSIS — E1159 Type 2 diabetes mellitus with other circulatory complications: Secondary | ICD-10-CM | POA: Diagnosis not present

## 2019-03-29 DIAGNOSIS — M79674 Pain in right toe(s): Secondary | ICD-10-CM

## 2019-03-29 NOTE — Progress Notes (Signed)
This patient presents to the office with chief complaint of long thick nails and diabetic feet.  This patient  says there  is  no pain and discomfort in her  feet.  This patient says there are long thick painful nails.  These nails are painful walking and wearing shoes.  Patient has no history of infection or drainage from both feet.  Patient is unable to  self treat his own nails . This patient presents  to the office today for treatment of the  long nails and a foot evaluation due to history of  diabetes.  General Appearance  Alert, conversant and in no acute stress.  Vascular  Dorsalis pedis  are palpable  bilaterally. Posterior tibial pulses are absent  B/L.  Capillary return is within normal limits  bilaterally. Temperature is within normal limits  bilaterally.  Neurologic  Senn-Weinstein monofilament wire test absent   bilaterally. Muscle power within normal limits bilaterally.  Nails Thick disfigured discolored nails with subungual debris  from hallux to fifth toes bilaterally. No evidence of bacterial infection or drainage bilaterally.  Orthopedic  No limitations of motion of motion feet .  No crepitus or effusions noted.  No bony pathology or digital deformities noted.  Skin  normotropic skin with no porokeratosis noted bilaterally.  No signs of infections or ulcers noted.     Onychomycosis  Diabetes with vascular disease and neuropathy.  IE  Debride nails x 10.  A diabetic foot exam was performed and there is no evidence of any vascular or neurologic pathology.   RTC 3 months.   Gardiner Barefoot DPM

## 2019-03-31 DIAGNOSIS — I69398 Other sequelae of cerebral infarction: Secondary | ICD-10-CM | POA: Diagnosis not present

## 2019-03-31 DIAGNOSIS — E1122 Type 2 diabetes mellitus with diabetic chronic kidney disease: Secondary | ICD-10-CM | POA: Diagnosis not present

## 2019-03-31 DIAGNOSIS — R29898 Other symptoms and signs involving the musculoskeletal system: Secondary | ICD-10-CM | POA: Diagnosis not present

## 2019-03-31 DIAGNOSIS — N182 Chronic kidney disease, stage 2 (mild): Secondary | ICD-10-CM | POA: Diagnosis not present

## 2019-03-31 DIAGNOSIS — I129 Hypertensive chronic kidney disease with stage 1 through stage 4 chronic kidney disease, or unspecified chronic kidney disease: Secondary | ICD-10-CM | POA: Diagnosis not present

## 2019-04-02 ENCOUNTER — Other Ambulatory Visit: Payer: Self-pay

## 2019-04-02 ENCOUNTER — Ambulatory Visit
Admission: RE | Admit: 2019-04-02 | Discharge: 2019-04-02 | Disposition: A | Discharge status: Home / Self Care | Payer: Medicare Other | Source: Ambulatory Visit | Attending: Family Medicine | Admitting: Family Medicine

## 2019-04-02 DIAGNOSIS — Z780 Asymptomatic menopausal state: Secondary | ICD-10-CM | POA: Diagnosis not present

## 2019-04-02 DIAGNOSIS — M85851 Other specified disorders of bone density and structure, right thigh: Secondary | ICD-10-CM | POA: Diagnosis not present

## 2019-04-02 DIAGNOSIS — E2839 Other primary ovarian failure: Secondary | ICD-10-CM

## 2019-04-06 DIAGNOSIS — R29898 Other symptoms and signs involving the musculoskeletal system: Secondary | ICD-10-CM | POA: Diagnosis not present

## 2019-04-06 DIAGNOSIS — N182 Chronic kidney disease, stage 2 (mild): Secondary | ICD-10-CM | POA: Diagnosis not present

## 2019-04-06 DIAGNOSIS — I69398 Other sequelae of cerebral infarction: Secondary | ICD-10-CM | POA: Diagnosis not present

## 2019-04-06 DIAGNOSIS — I129 Hypertensive chronic kidney disease with stage 1 through stage 4 chronic kidney disease, or unspecified chronic kidney disease: Secondary | ICD-10-CM | POA: Diagnosis not present

## 2019-04-06 DIAGNOSIS — E1122 Type 2 diabetes mellitus with diabetic chronic kidney disease: Secondary | ICD-10-CM | POA: Diagnosis not present

## 2019-04-07 ENCOUNTER — Other Ambulatory Visit: Payer: Self-pay

## 2019-04-07 ENCOUNTER — Ambulatory Visit (INDEPENDENT_AMBULATORY_CARE_PROVIDER_SITE_OTHER): Payer: Medicare Other | Admitting: Neurology

## 2019-04-07 DIAGNOSIS — W19XXXS Unspecified fall, sequela: Secondary | ICD-10-CM

## 2019-04-07 DIAGNOSIS — R569 Unspecified convulsions: Secondary | ICD-10-CM

## 2019-04-13 ENCOUNTER — Ambulatory Visit (INDEPENDENT_AMBULATORY_CARE_PROVIDER_SITE_OTHER): Payer: Medicare Other

## 2019-04-13 DIAGNOSIS — Z23 Encounter for immunization: Secondary | ICD-10-CM

## 2019-04-15 DIAGNOSIS — E1122 Type 2 diabetes mellitus with diabetic chronic kidney disease: Secondary | ICD-10-CM | POA: Diagnosis not present

## 2019-04-15 DIAGNOSIS — R29898 Other symptoms and signs involving the musculoskeletal system: Secondary | ICD-10-CM | POA: Diagnosis not present

## 2019-04-15 DIAGNOSIS — I129 Hypertensive chronic kidney disease with stage 1 through stage 4 chronic kidney disease, or unspecified chronic kidney disease: Secondary | ICD-10-CM | POA: Diagnosis not present

## 2019-04-15 DIAGNOSIS — I69398 Other sequelae of cerebral infarction: Secondary | ICD-10-CM | POA: Diagnosis not present

## 2019-04-15 DIAGNOSIS — N182 Chronic kidney disease, stage 2 (mild): Secondary | ICD-10-CM | POA: Diagnosis not present

## 2019-04-19 DIAGNOSIS — N182 Chronic kidney disease, stage 2 (mild): Secondary | ICD-10-CM | POA: Diagnosis not present

## 2019-04-19 DIAGNOSIS — I69398 Other sequelae of cerebral infarction: Secondary | ICD-10-CM | POA: Diagnosis not present

## 2019-04-19 DIAGNOSIS — R29898 Other symptoms and signs involving the musculoskeletal system: Secondary | ICD-10-CM | POA: Diagnosis not present

## 2019-04-19 DIAGNOSIS — E1122 Type 2 diabetes mellitus with diabetic chronic kidney disease: Secondary | ICD-10-CM | POA: Diagnosis not present

## 2019-04-19 DIAGNOSIS — I129 Hypertensive chronic kidney disease with stage 1 through stage 4 chronic kidney disease, or unspecified chronic kidney disease: Secondary | ICD-10-CM | POA: Diagnosis not present

## 2019-05-16 NOTE — Progress Notes (Signed)
Cardiology Office Note  Date:  05/18/2019   ID:  Stephanie [name], DOB 1933-07-21, MRN [id number]  PCP:  Abner Greenspan, MD   Chief Complaint  Patient presents with  . Other    2-3 month follow up. Patient denies chest pain and SOB at this time.  Meds reviewed verbally with patient.     HPI:  Stephanie [name] is a 84 year old woman with past medical history of Diabetes PAD CAD, stent x 2 PTCA stenting of the proximal LAD  PTCA of the diagonal 1 lesion performed in August 2005. Carotid stenosis, 40% b/l in 11/2016 HTN ? TIA (10/2016, couldn't talk; tongue issues ~40min and then went away) Presenting for f/u of her  CAD and PAD  Recent events discussed with her in detail Aug 2020 Had SVT, demand ischemia cardiac cath with recommendation for medical management After discharge had a stroke When she arrived home had a fall,  lying confused left facial droop largely resolved with time  MRI brain shows left temporal infarct.  Cath results reviewed with her 1.   Patent LAD stent with moderate restenosis and moderate left main and left circumflex disease.  No evidence of obstructive disease. 2.  Left ventricular angiography was not performed due to chronic kidney disease.  EF was mildly reduced by echo. 3.  Normal left ventricular end-diastolic pressure. 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   PMH:   has a past medical history of Allergy, Anxiety, Arthritis, CAD (coronary artery disease), Carotid arterial disease ([place]), Degenerative disk disease, Diabetes mellitus, Fracture of femoral neck, left ([place]) (01/21/2018), Hyperlipidemia, Hypertension, Hypoaldosteronism ([place]) (6/12), Hyponatremia, Ischemic cardiomyopathy, Osteopenia, Periodontal disease, Peripheral vascular disease ([place]), PSVT (paroxysmal supraventricular tachycardia) ([place]), Stroke ([place]), and Transient cerebral ischemia (11/06/2016).  PSH:    Past Surgical History:  Procedure Laterality Date  . CARDIAC CATHETERIZATION     x2 STENT  . CHOLECYSTECTOMY    . ESOPHAGOGASTRODUODENOSCOPY  03/2004   gastritis by biopsy  . GUM SURGERY  06/2010   laser surgery for gums  . HIP ARTHROPLASTY Left 01/21/2018   Procedure: ARTHROPLASTY BIPOLAR HIP (HEMIARTHROPLASTY);  Surgeon: Marchia Bond, MD;  Location: [facility];  Service: Orthopedics;  Laterality: Left;  . IR RADIOLOGIST EVAL & MGMT  06/17/2018  . LEFT HEART CATH AND CORONARY ANGIOGRAPHY N/A 02/16/2019   Procedure: LEFT HEART CATH AND CORONARY ANGIOGRAPHY;  Surgeon: Wellington Hampshire, MD;  Location: [facility];  Service: Cardiovascular;  Laterality: N/A;  . MM BREAST STEREO BX*L*R/S  12/12   normal  . TONSILLECTOMY    . TRANSTHORACIC ECHOCARDIOGRAM  11/2016   EF 55-60%, grade 1 DD with elevated LV end diastolic filling pressures, mildly dilated LA, mild TR and PR.   . TUBAL LIGATION      Current Outpatient Medications  Medication Sig Dispense Refill  . atorvastatin (LIPITOR) 20 MG tablet Take 1 tablet (20 mg total) by mouth daily. 90 tablet 0  . dipyridamole-aspirin (AGGRENOX) 200-25 MG 12hr capsule Take 1 capsule by mouth twice daily (Patient taking differently: Take 1 capsule by mouth 2 (two) times daily. ) 60 capsule 11  . famotidine  (PEPCID) 40 MG tablet Take 1 tablet (40 mg total) by mouth daily. (Patient taking differently: Take 40 mg by mouth See admin instructions. Take 40 mg by mouth one to two times a day) 30 tablet 3  . glipiZIDE (GLUCOTROL XL) 10 MG 24 hr tablet Take 1 tablet by mouth once daily with breakfast 90 tablet 1  . metFORMIN (GLUCOPHAGE) 1000 MG tablet Take 1 tablet (1,000 mg total) by mouth 2 (two) times daily with a meal. 180 tablet 0  . metoprolol succinate (TOPROL-XL) 100 MG 24 hr tablet TAKE 1 TABLET BY MOUTH ONCE DAILY TAKE  WITH  OR  IMMEDIATELY  FOLLOWING  A  MEAL 90 tablet 1  . nitroGLYCERIN (NITROSTAT) 0.4 MG SL tablet Place 1 tablet (0.4 mg total) under the tongue every 5 (five) minutes x 3 doses as needed for chest pain. 25 tablet 1  . PARoxetine (PAXIL) 40 MG tablet Take 1 tablet (40 mg total) by mouth at bedtime. 90 tablet 3   No current facility-administered medications for this visit.     Allergies:   Ace inhibitors, Amlodipine besy-benazepril hcl, Clopidogrel bisulfate, Erythromycin, Pneumovax 23 [pneumococcal vac polyvalent], and Tape   Social History:  The patient  reports that she has never smoked. She has never used smokeless tobacco. She reports that she does not drink alcohol or use drugs.   Family History:   family history includes Cancer in her mother; Diabetes in her father; Heart disease in her father, mother, and son; Hypertension in her mother.    Review of Systems: Review of Systems  Constitutional: Negative.   Respiratory: Negative.   Cardiovascular: Negative.   Gastrointestinal: Negative.   Musculoskeletal: Negative.        Leg weakness  Neurological: Negative.   Psychiatric/Behavioral: Negative.   All other systems reviewed and are negative.   PHYSICAL EXAM: VS:  BP (!) 160/60 (BP Location: Left Arm, Patient Position: Sitting, Cuff Size: Normal)   Pulse (!) 57   Ht 5\' 2"  (1.575 m)   Wt 109 lb (49.4 kg)   BMI 19.94 kg/m  , BMI Body mass index is 19.94  kg/m. Constitutional:  oriented to person, place, and time. No distress.  HENT:  Head: Grossly normal Eyes:  no discharge. No scleral icterus.  Neck: No JVD, no carotid bruits  Cardiovascular: Regular rate and rhythm, no murmurs appreciated Pulmonary/Chest: Clear to auscultation bilaterally, no wheezes or rails Abdominal: Soft.  no distension.  no tenderness.  Musculoskeletal: Normal range of motion Neurological:  normal muscle tone. Coordination normal. No atrophy Skin: Skin warm and dry Psychiatric: normal affect, pleasant   Recent Labs: 02/15/2019: Magnesium 1.6 02/16/2019: TSH 1.406 02/18/2019: ALT 11 02/22/2019: Hemoglobin 10.2; Platelets 271.0 03/10/2019: BUN 30; Creatinine, Ser 1.18; Potassium 4.2; Sodium 135    Lipid Panel Lab Results  Component Value Date   CHOL 115 02/18/2019   HDL 35 (L) 02/18/2019   LDLCALC 50 02/18/2019   TRIG 149 02/18/2019      Wt Readings from Last 3 Encounters:  05/18/19 109 lb (49.4 kg)  03/26/19 109 lb 2 oz (49.5 kg)  03/11/19 109 lb (49.4 kg)  ASSESSMENT AND PLAN:  Atherosclerosis of native coronary artery of native heart with stable angina pectoris ([place]) - Plan: EKG 12-Lead Recent catheterization results discussed with her, cholesterol at goal, non-smoker, No anginal symptoms Hemoglobin A1c 6.4  Peripheral vascular disease due to secondary diabetes mellitus (HCC) Mild bilateral carotid disease noted in 2018 Weight stable No further testing needed  Carotid stenosis, bilateral Mild bilateral disease 1 years ago in the setting of TIA As above, cholesterol at goal  Transient cerebral ischemia, unspecified type Prior stroke 2018, again with stroke after cardiac catheterization August 2020 On Aggrenox  Mixed hyperlipidemia Cholesterol is at goal on the current lipid regimen. No changes to the medications were made.  Stable  Essential hypertension Blood pressure elevated, she reports well-controlled blood pressures at  home She will call us with some numbers Reports having 120 on a consistent basis when therapy was coming to the house  Disposition:   F/U 1 year   Total encounter time more than 25 minutes  Greater than 50% was spent in counseling and coordination of care with the patient    Orders Placed This Encounter  Procedures  . EKG 12-Lead     Signed, Esmond Plants, M.D., Ph.D. 05/18/2019  [place], [place]

## 2019-05-18 ENCOUNTER — Encounter: Payer: Self-pay | Admitting: Cardiovascular Disease

## 2019-05-18 ENCOUNTER — Ambulatory Visit (INDEPENDENT_AMBULATORY_CARE_PROVIDER_SITE_OTHER): Payer: Medicare Other | Admitting: Cardiovascular Disease

## 2019-05-18 ENCOUNTER — Other Ambulatory Visit: Payer: Self-pay

## 2019-05-18 VITALS — BP 160/60 | HR 57 | Ht 62.0 in | Wt 109.0 lb

## 2019-05-18 DIAGNOSIS — I10 Essential (primary) hypertension: Secondary | ICD-10-CM

## 2019-05-18 DIAGNOSIS — I471 Supraventricular tachycardia, unspecified: Secondary | ICD-10-CM

## 2019-05-18 DIAGNOSIS — I6523 Occlusion and stenosis of bilateral carotid arteries: Secondary | ICD-10-CM

## 2019-05-18 DIAGNOSIS — I639 Cerebral infarction, unspecified: Secondary | ICD-10-CM

## 2019-05-18 DIAGNOSIS — I25118 Atherosclerotic heart disease of native coronary artery with other forms of angina pectoris: Secondary | ICD-10-CM | POA: Diagnosis not present

## 2019-05-18 DIAGNOSIS — E1351 Other specified diabetes mellitus with diabetic peripheral angiopathy without gangrene: Secondary | ICD-10-CM

## 2019-05-18 NOTE — Patient Instructions (Signed)
Please monitor blood pressure  Call if elevated  Medication Instructions:  No changes  If you need a refill on your cardiac medications before your next appointment, please call your pharmacy.    Lab work: No new labs needed   If you have labs (blood work) drawn today and your tests are completely normal, you will receive your results only by: Marland Kitchen MyChart Message (if you have MyChart) OR . A paper copy in the mail If you have any lab test that is abnormal or we need to change your treatment, we will call you to review the results.   Testing/Procedures: No new testing needed   Follow-Up: At [facility], you and your health needs are our priority.  As part of our continuing mission to provide you with exceptional heart care, we have created designated Provider Care Teams.  These Care Teams include your primary Cardiologist (physician) and Advanced Practice Providers (APPs -  Physician Assistants and Nurse Practitioners) who all work together to provide you with the care you need, when you need it.  . You will need a follow up appointment in 6 months .   Please call our office 2 months in advance to schedule this appointment.    . Providers on your designated Care Team:   . Murray Hodgkins, NP . Christell Faith, PA-C . Marrianne Mood, PA-C  Any Other Special Instructions Will Be Listed Below (If Applicable).  For educational health videos Log in to : [email or web address] Or : [email or web address], password : [email or web address]

## 2019-05-20 ENCOUNTER — Telehealth: Payer: Self-pay | Admitting: Family Medicine

## 2019-05-20 MED ORDER — METOPROLOL SUCCINATE ER 100 MG PO TB24: ORAL_TABLET | ORAL | 1 refills | Status: AC

## 2019-05-20 NOTE — Telephone Encounter (Signed)
Patient called.  Patient said she received Metoprolol 100 mg at [facility].  Patient needs a new rx called in to [facility]. Patient's out of medication.

## 2019-05-20 NOTE — Telephone Encounter (Signed)
See prev note ?? If we will be refilling this med or cardiologist

## 2019-06-28 ENCOUNTER — Ambulatory Visit: Payer: Medicare Other | Admitting: Podiatry

## 2019-06-28 ENCOUNTER — Encounter: Payer: Self-pay | Admitting: Podiatry

## 2019-06-28 ENCOUNTER — Other Ambulatory Visit: Payer: Self-pay

## 2019-06-28 DIAGNOSIS — M79675 Pain in left toe(s): Secondary | ICD-10-CM | POA: Diagnosis not present

## 2019-06-28 DIAGNOSIS — M79674 Pain in right toe(s): Secondary | ICD-10-CM

## 2019-06-28 DIAGNOSIS — E1159 Type 2 diabetes mellitus with other circulatory complications: Secondary | ICD-10-CM | POA: Diagnosis not present

## 2019-06-28 DIAGNOSIS — B351 Tinea unguium: Secondary | ICD-10-CM

## 2019-06-28 NOTE — Progress Notes (Signed)
Complaint:  Visit Type: Patient returns to my office for continued preventative foot care services. Complaint: Patient states" my nails have grown long and thick and become painful to walk and wear shoes" Patient has been diagnosed with DM with no foot complications. The patient presents for preventative foot care services.  Podiatric Exam: Vascular: dorsalis pedis  are palpable bilateral. Posterior tibial pulses are absent  B/L. Capillary return is immediate. Temperature gradient is WNL. Skin turgor WNL  Sensorium: Absent  Semmes Weinstein monofilament test. Normal tactile sensation bilaterally. Nail Exam: Pt has thick disfigured discolored nails with subungual debris noted bilateral entire nail hallux through fifth toenails Ulcer Exam: There is no evidence of ulcer or pre-ulcerative changes or infection. Orthopedic Exam: Muscle tone and strength are WNL. No limitations in general ROM. No crepitus or effusions noted. Foot type and digits show no abnormalities. Bony prominences are unremarkable. Skin: No Porokeratosis. No infection or ulcers  Diagnosis:  Onychomycosis, , Pain in right toe, pain in left toes,  Diabetes with angiopathy and neuropathy.  Treatment & Plan Procedures and Treatment: Consent by patient was obtained for treatment procedures.   Debridement of mycotic and hypertrophic toenails, 1 through 5 bilateral and clearing of subungual debris. No ulceration, no infection noted.  Return Visit-Office Procedure: Patient instructed to return to the office for a follow up visit 3 months for continued evaluation and treatment.    Gardiner Barefoot DPM

## 2019-07-20 ENCOUNTER — Encounter: Payer: Self-pay | Admitting: Family Medicine

## 2019-07-20 ENCOUNTER — Ambulatory Visit (INDEPENDENT_AMBULATORY_CARE_PROVIDER_SITE_OTHER): Payer: Medicare Other | Admitting: Family Medicine

## 2019-07-20 ENCOUNTER — Other Ambulatory Visit: Payer: Self-pay

## 2019-07-20 VITALS — BP 144/84 | HR 78 | Temp 95.5°F | Ht 62.0 in | Wt 104.6 lb

## 2019-07-20 DIAGNOSIS — E441 Mild protein-calorie malnutrition: Secondary | ICD-10-CM | POA: Diagnosis not present

## 2019-07-20 DIAGNOSIS — R6889 Other general symptoms and signs: Secondary | ICD-10-CM

## 2019-07-20 DIAGNOSIS — I10 Essential (primary) hypertension: Secondary | ICD-10-CM

## 2019-07-20 DIAGNOSIS — I471 Supraventricular tachycardia: Secondary | ICD-10-CM

## 2019-07-20 DIAGNOSIS — E782 Mixed hyperlipidemia: Secondary | ICD-10-CM | POA: Diagnosis not present

## 2019-07-20 DIAGNOSIS — N182 Chronic kidney disease, stage 2 (mild): Secondary | ICD-10-CM | POA: Diagnosis not present

## 2019-07-20 DIAGNOSIS — E1159 Type 2 diabetes mellitus with other circulatory complications: Secondary | ICD-10-CM

## 2019-07-20 DIAGNOSIS — I25118 Atherosclerotic heart disease of native coronary artery with other forms of angina pectoris: Secondary | ICD-10-CM

## 2019-07-20 DIAGNOSIS — E1351 Other specified diabetes mellitus with diabetic peripheral angiopathy without gangrene: Secondary | ICD-10-CM

## 2019-07-20 DIAGNOSIS — R350 Frequency of micturition: Secondary | ICD-10-CM

## 2019-07-20 DIAGNOSIS — F419 Anxiety disorder, unspecified: Secondary | ICD-10-CM

## 2019-07-20 DIAGNOSIS — R410 Disorientation, unspecified: Secondary | ICD-10-CM | POA: Insufficient documentation

## 2019-07-20 DIAGNOSIS — F329 Major depressive disorder, single episode, unspecified: Secondary | ICD-10-CM

## 2019-07-20 DIAGNOSIS — E1169 Type 2 diabetes mellitus with other specified complication: Secondary | ICD-10-CM

## 2019-07-20 NOTE — Assessment & Plan Note (Signed)
In pt with past hx of CVA Mentally sharp today  Makes questionable decisions (re: diet/diabetes care/self care) but seems to be in a good mood There is a ? About possible relation to high or low glucose or sleep dream states or uti  Blood work today Will return with a urine sample

## 2019-07-20 NOTE — Assessment & Plan Note (Signed)
This has not returned

## 2019-07-20 NOTE — Assessment & Plan Note (Signed)
Pt states mood is stable/good with paroxetine

## 2019-07-20 NOTE — Assessment & Plan Note (Signed)
?   If pt is hallucinating or confusion dream state with reality  She is mentally sharp today

## 2019-07-20 NOTE — Assessment & Plan Note (Signed)
Lab today Enc fluid intake and better DM control

## 2019-07-20 NOTE — Assessment & Plan Note (Signed)
Cardiology visit with Dr Rockey Situ reviewed from 11/20 Stable/no clinical changes  Lipid panel today

## 2019-07-20 NOTE — Assessment & Plan Note (Signed)
Want to assess for uti  Pt could not give sample- will bring one back  Enc water intake

## 2019-07-20 NOTE — Assessment & Plan Note (Signed)
Pt has not been compliant with diet or glucose monitoring lately for multiple reasons  Husband helped with hx Asked pt to check on brand of meter and equip that is covered (would love the [place] system but doubt covered)  Answered questions and made strategies (ie: no sweets in the house) A1C today  microalb today  Sent for last DM eye exam from Dr Matilde Sprang

## 2019-07-20 NOTE — Progress Notes (Signed)
Subjective:    Patient ID: Stephanie [name], female    DOB: 03-15-1934, 86 y.o.   MRN: [id number]  This visit occurred during the SARS-CoV-2 public health emergency.  Safety protocols were in place, including screening questions prior to the visit, additional usage of staff PPE, and extensive cleaning of exam room while observing appropriate contact time as indicated for disinfecting solutions.    HPI Pt presents to discuss sleeping habits and confusion  Husband gives much of the history   Wt Readings from Last 3 Encounters:  07/20/19 104 lb 9 oz (47.4 kg)  05/18/19 109 lb (49.4 kg)  03/26/19 109 lb 2 oz (49.5 kg)   19.12 kg/m   bp is stable today  No cp or palpitations or headaches or edema  No side effects to medicines  BP Readings from Last 3 Encounters:  07/20/19 (!) 144/84  05/18/19 (!) 160/60  03/26/19 139/60      She takes paxil for anxiety and depression  Well controlled Usually in a decent mood    Has not checked blood sugar in 6-7 months  Does not like sticking her finger   Last Friday- she asked husband "where did your father go" Another day- "is anybody out here [place] eat?" Confusion/ ? Hallucination She dreams very vividly and confuses it with reality   More frequent recently   She had trouble getting dressed- put legs through the arm holes of a shirt   Has been eating out of control - ice cream and cookies and sweets   Memory is fair -no big changes Forgets short term things   Pt does not notice any new urinary symptoms  Is sometimes thirsty   Sleeping better than she used to  Sleeps 12-13 hours per day -husband thinks it is excessive  No snoring or breath holding   Patient Active Problem List   Diagnosis Date Noted  . Episodic confusion 07/20/2019  . Frequency of urination 07/20/2019  . Vivid dream 07/20/2019  . Pain due to onychomycosis of toenails of both feet 03/29/2019  . Type 2 diabetes mellitus with vascular disease ([place]) 03/29/2019    . Onychomycosis of multiple toenails with type 2 diabetes mellitus and peripheral neuropathy ([place]) 03/29/2019  . Estrogen deficiency 03/26/2019  . Demand ischemia ([place]) 02/17/2019  . Chest pain 02/17/2019  . Acute CVA (cerebrovascular accident) ([place]) 02/17/2019  . Non-ST elevation (NSTEMI) myocardial infarction ([place]) 02/16/2019  . NSTEMI (non-ST elevated myocardial infarction) ([place])   . Colonic diverticular abscess 06/05/2018  . Anemia 06/05/2018  . Protein calorie malnutrition ([place]) 06/05/2018  . Closed fracture of left hip ([place])   . Chronic kidney disease (CKD), stage II (mild) 01/22/2018  . Fracture of femoral neck, left ([place]) 01/21/2018  . GERD (gastroesophageal reflux disease) 01/21/2018  . CAD (coronary artery disease) 01/21/2018  . Subcapital fracture of left hip ([place]) 01/21/2018  . Carotid stenosis, bilateral 11/20/2016  . Transient cerebral ischemia 11/06/2016  . Vitamin D deficiency 09/04/2015  . Routine general medical examination at a health care facility 09/04/2015  . Encounter for Medicare annual wellness exam 05/30/2014  . Colon cancer screening 05/30/2014  . History of fall 05/26/2013  . Gynecological examination 02/20/2011  . HELICOBACTER PYLORI INFECTION 12/19/2006  . HSV 12/19/2006  . Type 2 diabetes mellitus ([place]) 12/19/2006  . Diabetic retinopathy ([place]) 12/19/2006  . Combined hyperlipidemia associated with type 2 diabetes mellitus ([place]) 12/19/2006  . ANXIETY 12/19/2006  . HTN (hypertension) 12/19/2006  . Coronary artery disease of  native artery of native heart with stable angina pectoris ([place]) 12/19/2006  . Peripheral vascular disease due to secondary diabetes mellitus ([place]) 12/19/2006  . ALLERGIC RHINITIS 12/19/2006  . OSTEOARTHRITIS 12/19/2006  . Osteopenia 12/19/2006   Past Medical History:  Diagnosis Date  . Allergy    allergic rhinitis  . Anxiety   . Arthritis    osteoarthritis  . CAD (coronary artery disease)    a. s/p prior stenting of the  LAD and D1 (02/2004); b. 02/2019 NSTEMI in setting of PSVT/Cath: >M 55m/d, LAD 40ost/p/m, D1 60, LCX 18m, 50d, RCA  nl-->Med Rx.  . Carotid arterial disease ([place])    a. 02/2019 Carotid U/S: RICA [id number], LICA [id number]. Antegrade bilat vert flow.  . Degenerative disk disease    in neck  . Diabetes mellitus    type II  . Fracture of femoral neck, left ([place]) 01/21/2018  . Hyperlipidemia   . Hypertension   . Hypoaldosteronism ([place]) 6/12   from DM causing hyperkalemia   . Hyponatremia    a. 02/2019 in setting of chlorthalidone rx (d/c'd).  . Ischemic cardiomyopathy    a. 11/2016 Echo: EF 55-60%, Gr1 DD; b. 02/2019 Echo: EF 45-50%, sev basal-mid infsept, inf, inflat HK. DD. Sev dil LA.  . Osteopenia   . Periodontal disease   . Peripheral vascular disease ([place])   . PSVT (paroxysmal supraventricular tachycardia) ([place])    a. 02/2019 - in absence of BB therapy-->broke w/ adenosine.  . Stroke [facility])    a. 02/2019 MRI: L temporal infarct (1 day following cardiac cath).  . Transient cerebral ischemia 11/06/2016   Past Surgical History:  Procedure Laterality Date  . CARDIAC CATHETERIZATION     x2 STENT  . CHOLECYSTECTOMY    . ESOPHAGOGASTRODUODENOSCOPY  03/2004   gastritis by biopsy  . GUM SURGERY  06/2010   laser surgery for gums  . HIP ARTHROPLASTY Left 01/21/2018   Procedure: ARTHROPLASTY BIPOLAR HIP (HEMIARTHROPLASTY);  Surgeon: Marchia Bond, MD;  Location: [facility];  Service: Orthopedics;  Laterality: Left;  . IR RADIOLOGIST EVAL & MGMT  06/17/2018  . LEFT HEART CATH AND CORONARY ANGIOGRAPHY N/A 02/16/2019   Procedure: LEFT HEART CATH AND CORONARY ANGIOGRAPHY;  Surgeon: Wellington Hampshire, MD;  Location: [facility];  Service: Cardiovascular;  Laterality: N/A;  . MM BREAST STEREO BX*L*R/S  12/12   normal  . TONSILLECTOMY    . TRANSTHORACIC ECHOCARDIOGRAM  11/2016   EF 55-60%, grade 1 DD with elevated LV end diastolic filling pressures, mildly dilated LA, mild TR and PR.   . TUBAL LIGATION      Social History   Tobacco Use  . Smoking status: Never Smoker  . Smokeless tobacco: Never Used  Substance Use Topics  . Alcohol use: No    Alcohol/week: 0.0 standard drinks  . Drug use: No   Family History  Problem Relation Age of Onset  . Cancer Mother        vaginal CA in situ  . Hypertension Mother   . Heart disease Mother        CAD and MI  . Heart disease Father        CAD  . Diabetes Father   . Heart disease Son        congenital valve problem   Allergies  Allergen Reactions  . Ace Inhibitors Other (See Comments)    Chest pain  . Amlodipine Besy-Benazepril Hcl Other (See Comments)    Chest pain  . Clopidogrel Bisulfate  Other (See Comments)    GI upset  . Erythromycin Other (See Comments)    GI upset  . Pneumovax 23 [Pneumococcal Vac Polyvalent]     Local reaction   . Tape Other (See Comments)    Causes soreness (of the skin)   Current Outpatient Medications on File Prior to Visit  Medication Sig Dispense Refill  . atorvastatin (LIPITOR) 20 MG tablet Take 1 tablet (20 mg total) by mouth daily. 90 tablet 0  . dipyridamole-aspirin (AGGRENOX) 200-25 MG 12hr capsule Take 1 capsule by mouth twice daily (Patient taking differently: Take 1 capsule by mouth 2 (two) times daily. ) 60 capsule 11  . famotidine (PEPCID) 40 MG tablet Take 1 tablet (40 mg total) by mouth daily. (Patient taking differently: Take 40 mg by mouth See admin instructions. Take 40 mg by mouth one to two times a day) 30 tablet 3  . glipiZIDE (GLUCOTROL XL) 10 MG 24 hr tablet Take 1 tablet by mouth once daily with breakfast 90 tablet 1  . metFORMIN (GLUCOPHAGE) 1000 MG tablet Take 1 tablet (1,000 mg total) by mouth 2 (two) times daily with a meal. 180 tablet 0  . metoprolol succinate (TOPROL-XL) 100 MG 24 hr tablet TAKE 1 TABLET BY MOUTH ONCE DAILY TAKE  WITH  OR  IMMEDIATELY  FOLLOWING  A  MEAL 90 tablet 1  . nitroGLYCERIN (NITROSTAT) 0.4 MG SL tablet Place 1 tablet (0.4 mg total) under the tongue  every 5 (five) minutes x 3 doses as needed for chest pain. 25 tablet 1  . PARoxetine (PAXIL) 40 MG tablet Take 1 tablet (40 mg total) by mouth at bedtime. 90 tablet 3   No current facility-administered medications on file prior to visit.     Review of Systems  Constitutional: Positive for fatigue. Negative for activity change, appetite change, fever and unexpected weight change.  HENT: Negative for congestion, ear pain, rhinorrhea, sinus pressure and sore throat.   Eyes: Negative for pain, redness and visual disturbance.       Cataracts   Respiratory: Negative for cough, shortness of breath and wheezing.   Cardiovascular: Negative for chest pain and palpitations.  Gastrointestinal: Negative for abdominal pain, blood in stool, constipation and diarrhea.  Endocrine: Negative for polydipsia and polyuria.  Genitourinary: Positive for frequency and urgency. Negative for decreased urine volume, dysuria and hematuria.  Musculoskeletal: Negative for arthralgias, back pain and myalgias.  Skin: Negative for pallor and rash.  Allergic/Immunologic: Negative for environmental allergies.  Neurological: Negative for dizziness, syncope, facial asymmetry and headaches.       Poor balance Occasional falls - does use walker at home   Hematological: Negative for adenopathy. Does not bruise/bleed easily.  Psychiatric/Behavioral: Positive for confusion. Negative for decreased concentration, dysphoric mood, sleep disturbance and suicidal ideas. The patient is not nervous/anxious.        Vivid dreams Unsure if she has hallucinated while awake        Objective:   Physical Exam Constitutional:      General: She is not in acute distress.    Appearance: Normal appearance. She is well-developed. She is not ill-appearing or diaphoretic.  HENT:     Head: Normocephalic and atraumatic.     Mouth/Throat:     Mouth: Mucous membranes are moist.  Eyes:     General: No scleral icterus.    Conjunctiva/sclera:  Conjunctivae normal.     Pupils: Pupils are equal, round, and reactive to light.  Neck:     Thyroid:  No thyromegaly.     Vascular: No carotid bruit or JVD.  Cardiovascular:     Rate and Rhythm: Normal rate and regular rhythm.     Heart sounds: Normal heart sounds. No gallop.   Pulmonary:     Effort: Pulmonary effort is normal. No respiratory distress.     Breath sounds: Normal breath sounds. No wheezing or rales.     Comments: Good air exch No crackles  Abdominal:     General: Bowel sounds are normal. There is no distension or abdominal bruit.     Palpations: Abdomen is soft. There is no mass.     Tenderness: There is no abdominal tenderness.  Musculoskeletal:     Cervical back: Normal range of motion and neck supple.  Lymphadenopathy:     Cervical: No cervical adenopathy.  Skin:    General: Skin is warm and dry.     Findings: No rash.  Neurological:     Mental Status: She is alert.     Cranial Nerves: No cranial nerve deficit.     Sensory: No sensory deficit.     Motor: No weakness.     Deep Tendon Reflexes: Reflexes are normal and symmetric. Reflexes normal.     Comments: Wide based gait- with assistance (person or cane)  Generally poor balance  Psychiatric:        Attention and Perception: She is attentive.        Mood and Affect: Mood normal. Mood is not anxious or depressed.        Behavior: Behavior normal.        Cognition and Memory: Cognition normal.     Comments: Mood is good  Answers questions appropriately  Mentally sharp today           Assessment & Plan:   Problem List Items Addressed This Visit      Cardiovascular and Mediastinum   HTN (hypertension)    bp in fair control at this time (it tends to be higher in the office than out)  BP Readings from Last 1 Encounters:  07/20/19 (!) 144/84   No changes needed Most recent labs reviewed  Disc lifstyle change with low sodium diet and exercise        Relevant Orders   CBC w/Diff    Comprehensive metabolic panel   Lipid panel   TSH   Coronary artery disease of native artery of native heart with stable angina pectoris [facility])    Cardiology visit with Dr Rockey Situ reviewed from 11/20 Stable/no clinical changes  Lipid panel today      Peripheral vascular disease due to secondary diabetes mellitus ([place])    No clinical changes On statin and aggrenox  Saw cardiology for f/u in nov 2020      Type 2 diabetes mellitus with vascular disease ([place])    Pt has not been compliant with diet or glucose monitoring lately for multiple reasons  Husband helped with hx Asked pt to check on brand of meter and equip that is covered (would love the [place] system but doubt covered)  Answered questions and made strategies (ie: no sweets in the house) A1C today  microalb today  Sent for last DM eye exam from Dr Matilde Sprang       Relevant Orders   Hemoglobin A1c   Urine Microalbumin w/creat. ratio   RESOLVED: PSVT (paroxysmal supraventricular tachycardia) ([place])    This has not returned         Endocrine   Combined hyperlipidemia  associated with type 2 diabetes mellitus (HCC)   Relevant Orders   Comprehensive metabolic panel   Lipid panel     Genitourinary   Chronic kidney disease (CKD), stage II (mild)    Lab today Enc fluid intake and better DM control       Relevant Orders   Comprehensive metabolic panel     Other   Anxiety and depression    Pt states mood is stable/good with paroxetine       Protein calorie malnutrition (HCC)    Pt is back to eating a lot of sugar/carbs Enc her to get back to higher protein/low glycemic diet  Wt is down       Episodic confusion - Primary    In pt with past hx of CVA Mentally sharp today  Makes questionable decisions (re: diet/diabetes care/self care) but seems to be in a good mood There is a ? About possible relation to high or low glucose or sleep dream states or uti  Blood work today Will return with a urine sample       Relevant  Orders   CBC w/Diff   Comprehensive metabolic panel   TSH   Vitamin B12   Frequency of urination    Want to assess for uti  Pt could not give sample- will bring one back  Enc water intake      Vivid dream    ? If pt is hallucinating or confusion dream state with reality  She is mentally sharp today

## 2019-07-20 NOTE — Patient Instructions (Addendum)
Find out what brand of meter/strips/lancets your insurance will cover  If the [employer] [place] system is covered -let us know  Call back and let us know and I will refill   Try to get all sweets out of the house   Try to eat a more balanced diet  Stay active with your walker

## 2019-07-20 NOTE — Assessment & Plan Note (Signed)
bp in fair control at this time (it tends to be higher in the office than out)  BP Readings from Last 1 Encounters:  07/20/19 (!) 144/84   No changes needed Most recent labs reviewed  Disc lifstyle change with low sodium diet and exercise

## 2019-07-20 NOTE — Assessment & Plan Note (Signed)
Pt is back to eating a lot of sugar/carbs Enc her to get back to higher protein/low glycemic diet  Wt is down

## 2019-07-20 NOTE — Assessment & Plan Note (Signed)
No clinical changes On statin and aggrenox  Saw cardiology for f/u in nov 2020

## 2019-07-21 LAB — CBC WITH DIFFERENTIAL/PLATELET
Basophils Absolute: 0.1 10*3/uL (ref 0.0–0.1)
Basophils Relative: 1 % (ref 0.0–3.0)
Eosinophils Absolute: 0.1 10*3/uL (ref 0.0–0.7)
Eosinophils Relative: 1.4 % (ref 0.0–5.0)
HCT: 35.7 % — ABNORMAL LOW (ref 36.0–46.0)
Hemoglobin: 11.9 g/dL — ABNORMAL LOW (ref 12.0–15.0)
Lymphocytes Relative: 37.9 % (ref 12.0–46.0)
Lymphs Abs: 3.8 10*3/uL (ref 0.7–4.0)
MCHC: 33.3 g/dL (ref 30.0–36.0)
MCV: 91.5 fl (ref 78.0–100.0)
Monocytes Absolute: 0.7 10*3/uL (ref 0.1–1.0)
Monocytes Relative: 7.4 % (ref 3.0–12.0)
Neutro Abs: 5.3 10*3/uL (ref 1.4–7.7)
Neutrophils Relative %: 52.3 % (ref 43.0–77.0)
Platelets: 279 10*3/uL (ref 150.0–400.0)
RBC: 3.9 Mil/uL (ref 3.87–5.11)
RDW: 13.8 % (ref 11.5–15.5)
WBC: 10.1 10*3/uL (ref 4.0–10.5)

## 2019-07-21 LAB — COMPREHENSIVE METABOLIC PANEL
ALT: 7 U/L (ref 0–35)
AST: 15 U/L (ref 0–37)
Albumin: 4.2 g/dL (ref 3.5–5.2)
Alkaline Phosphatase: 56 U/L (ref 39–117)
BUN: 30 mg/dL — ABNORMAL HIGH (ref 6–23)
CO2: 25 mEq/L (ref 19–32)
Calcium: 10 mg/dL (ref 8.4–10.5)
Chloride: 100 mEq/L (ref 96–112)
Creatinine, Ser: 1.32 mg/dL — ABNORMAL HIGH (ref 0.40–1.20)
GFR: 38.39 mL/min — ABNORMAL LOW (ref >60.00)
Glucose, Bld: 134 mg/dL — ABNORMAL HIGH (ref 70–99)
Potassium: 4.2 mEq/L (ref 3.5–5.1)
Sodium: 136 mEq/L (ref 135–145)
Total Bilirubin: 0.4 mg/dL (ref 0.2–1.2)
Total Protein: 7.4 g/dL (ref 6.0–8.3)

## 2019-07-21 LAB — HEMOGLOBIN A1C: Hgb A1c MFr Bld: 6.5 % (ref 4.6–6.5)

## 2019-07-21 LAB — LIPID PANEL
Cholesterol: 223 mg/dL — ABNORMAL HIGH (ref 0–200)
HDL: 41 mg/dL (ref >39.00)
NonHDL: 181.67
Total CHOL/HDL Ratio: 5
Triglycerides: 242 mg/dL — ABNORMAL HIGH (ref 0.0–149.0)
VLDL: 48.4 mg/dL — ABNORMAL HIGH (ref 0.0–40.0)

## 2019-07-21 LAB — VITAMIN B12: Vitamin B-12: 309 pg/mL (ref 211–911)

## 2019-07-21 LAB — TSH: TSH: 1.93 u[IU]/mL (ref 0.35–4.50)

## 2019-07-21 LAB — LDL CHOLESTEROL, DIRECT: Direct LDL: 151 mg/dL

## 2019-07-22 ENCOUNTER — Telehealth: Payer: Self-pay | Admitting: Family Medicine

## 2019-07-22 DIAGNOSIS — E1159 Type 2 diabetes mellitus with other circulatory complications: Secondary | ICD-10-CM | POA: Diagnosis not present

## 2019-07-22 MED ORDER — ATORVASTATIN CALCIUM 20 MG PO TABS: 20.0000 mg | ORAL_TABLET | Freq: Every day | ORAL | 3 refills | Status: AC

## 2019-07-22 NOTE — Addendum Note (Signed)
Addended by: Cloyd Stagers on: 07/22/2019 04:16 PM   Modules accepted: Orders

## 2019-07-22 NOTE — Telephone Encounter (Signed)
Cardiology generally does this but I sent it in  Will wait on urine sample Thanks

## 2019-07-22 NOTE — Addendum Note (Signed)
Addended by: Cloyd Stagers on: 07/22/2019 04:17 PM   Modules accepted: Orders

## 2019-07-22 NOTE — Telephone Encounter (Signed)
-----   Message from Stephanie [name], [place] sent at 07/22/2019  1:03 PM EST ----- Pt notified of lab results and Dr. Marliss Coots comments. Pt will call us back when she finds out meter info. Pt also said she does have the UA supplies so she will try and get someone to bring a sample back soon. Pt said that she has also been out of her cholesterol med for a while, not sure if you fill it or cardiology but she is out of meds for over a month and needs them refilled to [facility]

## 2019-07-23 LAB — URINALYSIS, COMPLETE
Bilirubin Urine: NEGATIVE
Glucose, UA: NEGATIVE
Hgb urine dipstick: NEGATIVE
Hyaline Cast: NONE SEEN /LPF
Ketones, ur: NEGATIVE
Nitrite: POSITIVE — AB
Specific Gravity, Urine: 1.011 (ref 1.001–1.03)
Squamous Epithelial / HPF: NONE SEEN /HPF (ref <=5)
pH: <=5 (ref 5.0–8.0)

## 2019-07-23 LAB — EXTRA URINE SPECIMEN

## 2019-07-23 LAB — TEST AUTHORIZATION

## 2019-07-23 LAB — MICROALBUMIN / CREATININE URINE RATIO
Creatinine, Urine: 75 mg/dL (ref 20–275)
Microalb Creat Ratio: 285 mcg/mg creat — ABNORMAL HIGH (ref <30)
Microalb, Ur: 21.4 mg/dL

## 2019-07-23 MED ORDER — CEPHALEXIN 500 MG PO CAPS: 500.0000 mg | ORAL_CAPSULE | Freq: Two times a day (BID) | ORAL | 0 refills | Status: AC

## 2019-07-23 NOTE — Telephone Encounter (Signed)
Spoke to pt. Made lab appt 08-02-19. Do not know the answer for the culture. Will see if [place] knows on Monday.

## 2019-07-23 NOTE — Telephone Encounter (Signed)
Urine looks positive for a uti and I want to treat it  If culture tube was sent please run it but I don't think it was  The uti may be the cause of the increased kidney numbers  I sent keflex to her pharmacy ([facility]) please take as directed and drink lots of fluids  Please re check ua and renal panel in 10-14 days

## 2019-07-26 NOTE — Telephone Encounter (Signed)
When pt dropped of urine sample Stephanie [name] was only aware of the micro alb. lab order so she sent it to the lab, she added the UA once she was aware but our lab at [facility] did the UA not in our office. It is to late to order culture on urine FYI to PCP, pt does have another lab appt to recheck urine on 08/02/19 we can run urine cx then

## 2019-08-02 ENCOUNTER — Other Ambulatory Visit (INDEPENDENT_AMBULATORY_CARE_PROVIDER_SITE_OTHER): Payer: Medicare Other

## 2019-08-02 DIAGNOSIS — R7989 Other specified abnormal findings of blood chemistry: Secondary | ICD-10-CM

## 2019-08-02 DIAGNOSIS — N390 Urinary tract infection, site not specified: Secondary | ICD-10-CM | POA: Diagnosis not present

## 2019-08-02 LAB — RENAL FUNCTION PANEL
Albumin: 3.9 g/dL (ref 3.5–5.2)
BUN: 30 mg/dL — ABNORMAL HIGH (ref 6–23)
CO2: 28 mEq/L (ref 19–32)
Calcium: 9.4 mg/dL (ref 8.4–10.5)
Chloride: 103 mEq/L (ref 96–112)
Creatinine, Ser: 1.28 mg/dL — ABNORMAL HIGH (ref 0.40–1.20)
GFR: 39.78 mL/min — ABNORMAL LOW (ref >60.00)
Glucose, Bld: 160 mg/dL — ABNORMAL HIGH (ref 70–99)
Phosphorus: 3.8 mg/dL (ref 2.3–4.6)
Potassium: 4.1 mEq/L (ref 3.5–5.1)
Sodium: 138 mEq/L (ref 135–145)

## 2019-08-02 NOTE — Addendum Note (Signed)
Addended by: Ellamae Sia on: 08/02/2019 02:47 PM   Modules accepted: Orders

## 2019-08-03 LAB — URINALYSIS, ROUTINE W REFLEX MICROSCOPIC
Bilirubin Urine: NEGATIVE
Hgb urine dipstick: NEGATIVE
Ketones, ur: NEGATIVE
Leukocytes,Ua: NEGATIVE
Nitrite: NEGATIVE
RBC / HPF: NONE SEEN
Specific Gravity, Urine: 1.015 (ref 1.000–1.030)
Total Protein, Urine: 30 — AB
Urine Glucose: 250 — AB
Urobilinogen, UA: 0.2 (ref 0.0–1.0)
WBC, UA: NONE SEEN
pH: 6.5 (ref 5.0–8.0)

## 2019-08-03 LAB — URINE CULTURE
MICRO NUMBER:: 10052502
SPECIMEN QUALITY:: ADEQUATE

## 2019-08-04 ENCOUNTER — Telehealth: Payer: Self-pay | Admitting: *Deleted

## 2019-08-04 NOTE — Telephone Encounter (Signed)
Left VM requesting pt to call the office back regarding urine cx results  

## 2019-08-04 NOTE — Telephone Encounter (Signed)
-----   Message from Abner Greenspan, MD sent at 08/03/2019  8:42 PM EST ----- Urine culture is negative for infection (so this is not the cause of increased kidney numbers)  Metformin is hard on the kidney -I would like to cut it back and try low dose januvia for her glucose control  I need to know how blood glucose levels are first- please check bid for 1-2 weeks and send me the record  (if she does not have a functioning glucometer then we really need to know what brand to px)

## 2019-08-09 NOTE — Telephone Encounter (Signed)
Addressed through result notes  

## 2019-08-16 ENCOUNTER — Ambulatory Visit: Payer: Medicare Other

## 2019-08-18 ENCOUNTER — Telehealth: Payer: Self-pay | Admitting: Family Medicine

## 2019-08-18 ENCOUNTER — Other Ambulatory Visit: Payer: Self-pay | Admitting: Family Medicine

## 2019-08-18 DIAGNOSIS — N182 Chronic kidney disease, stage 2 (mild): Secondary | ICD-10-CM

## 2019-08-18 NOTE — Telephone Encounter (Signed)
-----   Message from Cloyd Stagers, RT sent at 08/06/2019  2:57 PM EST ----- Regarding: Lab Orders For Thursday 2.4.2021 Please place lab orders for Thursday 2.4.2021, appt notes state "2 week f/u labs" Thank you, Dyke Maes RT(R)

## 2019-08-19 ENCOUNTER — Other Ambulatory Visit (INDEPENDENT_AMBULATORY_CARE_PROVIDER_SITE_OTHER): Payer: Medicare Other

## 2019-08-19 ENCOUNTER — Other Ambulatory Visit: Payer: Self-pay

## 2019-08-19 DIAGNOSIS — N182 Chronic kidney disease, stage 2 (mild): Secondary | ICD-10-CM | POA: Diagnosis not present

## 2019-08-19 LAB — RENAL FUNCTION PANEL
Albumin: 3.3 g/dL — ABNORMAL LOW (ref 3.5–5.2)
BUN: 31 mg/dL — ABNORMAL HIGH (ref 6–23)
CO2: 25 mEq/L (ref 19–32)
Calcium: 9 mg/dL (ref 8.4–10.5)
Chloride: 100 mEq/L (ref 96–112)
Creatinine, Ser: 1.31 mg/dL — ABNORMAL HIGH (ref 0.40–1.20)
GFR: 38.72 mL/min — ABNORMAL LOW (ref >60.00)
Glucose, Bld: 357 mg/dL — ABNORMAL HIGH (ref 70–99)
Phosphorus: 3 mg/dL (ref 2.3–4.6)
Potassium: 3.9 mEq/L (ref 3.5–5.1)
Sodium: 135 mEq/L (ref 135–145)

## 2019-08-22 ENCOUNTER — Ambulatory Visit: Payer: Medicare Other

## 2019-08-22 ENCOUNTER — Telehealth: Payer: Self-pay | Admitting: Family Medicine

## 2019-08-22 DIAGNOSIS — N182 Chronic kidney disease, stage 2 (mild): Secondary | ICD-10-CM

## 2019-08-22 NOTE — Telephone Encounter (Signed)
-----   Message from Tammi Sou, [place] sent at 08/20/2019 11:46 AM EST ----- Pt notified of lab results and Dr. Marliss Coots comments. Pt said that she went to a kidney specialist in [place] that Dr. Glori Bickers referred her to a few years back and they "didn't do anything" he just talked to her about her kidneys and didn't change her meds or anything she stopped going because she said it was to expensive to pay the co-pay for a doctor that "doesn't know what he's doing". She said she is willing to go for the initial consultation with a new kidney doctor but they have to be in [place]. Pt also said that her and her son will "get back with Dr. Glori Bickers" regarding the meter and further treatment. She doesn't want to do anything now but will wait to talk to her son and they will call back at a later time

## 2019-08-22 NOTE — Telephone Encounter (Signed)
Order done Will route to [facility] 

## 2019-08-24 ENCOUNTER — Encounter: Payer: Self-pay | Admitting: Family Medicine

## 2019-08-28 ENCOUNTER — Ambulatory Visit: Payer: Medicare Other | Attending: Internal Medicine

## 2019-08-28 DIAGNOSIS — Z23 Encounter for immunization: Secondary | ICD-10-CM | POA: Insufficient documentation

## 2019-08-28 NOTE — Progress Notes (Signed)
   Covid-19 Vaccination Clinic  Name:  Stephanie [name]    MRN: [id number] DOB: 07/31/1933  08/28/2019  [name] was observed post Covid-19 immunization for 30 minutes based on pre-vaccination screening without incidence. She was provided with Vaccine Information Sheet and instruction to access the V-Safe system.   [name] was instructed to call 911 with any severe reactions post vaccine: Marland Kitchen Difficulty breathing  . Swelling of your face and throat  . A fast heartbeat  . A bad rash all over your body  . Dizziness and weakness    Immunizations Administered    Name Date Dose VIS Date Route   Pfizer COVID-19 Vaccine 08/28/2019 11:44 AM 0.3 mL 06/25/2019 Intramuscular   Manufacturer: [place]   Lot: [id number]   [place]: [id number]

## 2019-08-30 ENCOUNTER — Inpatient Hospital Stay (HOSPITAL_COMMUNITY)
Admission: EM | Admit: 2019-08-30 | Discharge: 2019-09-13 | DRG: 291 | Disposition: E | Payer: Medicare Other | Attending: Cardiovascular Disease | Admitting: Cardiovascular Disease

## 2019-08-30 ENCOUNTER — Encounter (HOSPITAL_COMMUNITY): Payer: Self-pay | Admitting: Emergency Medicine

## 2019-08-30 ENCOUNTER — Other Ambulatory Visit: Payer: Self-pay

## 2019-08-30 ENCOUNTER — Emergency Department (HOSPITAL_COMMUNITY): Payer: Medicare Other

## 2019-08-30 DIAGNOSIS — I4891 Unspecified atrial fibrillation: Secondary | ICD-10-CM | POA: Diagnosis not present

## 2019-08-30 DIAGNOSIS — Z20822 Contact with and (suspected) exposure to covid-19: Secondary | ICD-10-CM | POA: Diagnosis not present

## 2019-08-30 DIAGNOSIS — Z515 Encounter for palliative care: Secondary | ICD-10-CM

## 2019-08-30 DIAGNOSIS — G9341 Metabolic encephalopathy: Secondary | ICD-10-CM | POA: Diagnosis not present

## 2019-08-30 DIAGNOSIS — R079 Chest pain, unspecified: Secondary | ICD-10-CM | POA: Diagnosis not present

## 2019-08-30 DIAGNOSIS — I4819 Other persistent atrial fibrillation: Secondary | ICD-10-CM | POA: Diagnosis not present

## 2019-08-30 DIAGNOSIS — I480 Paroxysmal atrial fibrillation: Secondary | ICD-10-CM | POA: Diagnosis not present

## 2019-08-30 DIAGNOSIS — Z8249 Family history of ischemic heart disease and other diseases of the circulatory system: Secondary | ICD-10-CM

## 2019-08-30 DIAGNOSIS — Z7189 Other specified counseling: Secondary | ICD-10-CM | POA: Diagnosis not present

## 2019-08-30 DIAGNOSIS — K219 Gastro-esophageal reflux disease without esophagitis: Secondary | ICD-10-CM | POA: Diagnosis not present

## 2019-08-30 DIAGNOSIS — G9340 Encephalopathy, unspecified: Secondary | ICD-10-CM | POA: Diagnosis not present

## 2019-08-30 DIAGNOSIS — Z7902 Long term (current) use of antithrombotics/antiplatelets: Secondary | ICD-10-CM

## 2019-08-30 DIAGNOSIS — I248 Other forms of acute ischemic heart disease: Secondary | ICD-10-CM | POA: Diagnosis present

## 2019-08-30 DIAGNOSIS — Z833 Family history of diabetes mellitus: Secondary | ICD-10-CM

## 2019-08-30 DIAGNOSIS — I340 Nonrheumatic mitral (valve) insufficiency: Secondary | ICD-10-CM | POA: Diagnosis not present

## 2019-08-30 DIAGNOSIS — Z9189 Other specified personal risk factors, not elsewhere classified: Secondary | ICD-10-CM | POA: Diagnosis not present

## 2019-08-30 DIAGNOSIS — I509 Heart failure, unspecified: Secondary | ICD-10-CM

## 2019-08-30 DIAGNOSIS — E11649 Type 2 diabetes mellitus with hypoglycemia without coma: Secondary | ICD-10-CM | POA: Diagnosis not present

## 2019-08-30 DIAGNOSIS — Z96642 Presence of left artificial hip joint: Secondary | ICD-10-CM | POA: Diagnosis present

## 2019-08-30 DIAGNOSIS — Z8673 Personal history of transient ischemic attack (TIA), and cerebral infarction without residual deficits: Secondary | ICD-10-CM

## 2019-08-30 DIAGNOSIS — I110 Hypertensive heart disease with heart failure: Principal | ICD-10-CM | POA: Diagnosis present

## 2019-08-30 DIAGNOSIS — N179 Acute kidney failure, unspecified: Secondary | ICD-10-CM | POA: Diagnosis not present

## 2019-08-30 DIAGNOSIS — E1142 Type 2 diabetes mellitus with diabetic polyneuropathy: Secondary | ICD-10-CM | POA: Diagnosis present

## 2019-08-30 DIAGNOSIS — R627 Adult failure to thrive: Secondary | ICD-10-CM | POA: Diagnosis present

## 2019-08-30 DIAGNOSIS — E1151 Type 2 diabetes mellitus with diabetic peripheral angiopathy without gangrene: Secondary | ICD-10-CM | POA: Diagnosis present

## 2019-08-30 DIAGNOSIS — I2510 Atherosclerotic heart disease of native coronary artery without angina pectoris: Secondary | ICD-10-CM | POA: Diagnosis present

## 2019-08-30 DIAGNOSIS — Z9119 Patient's noncompliance with other medical treatment and regimen: Secondary | ICD-10-CM

## 2019-08-30 DIAGNOSIS — I639 Cerebral infarction, unspecified: Secondary | ICD-10-CM | POA: Diagnosis not present

## 2019-08-30 DIAGNOSIS — E1140 Type 2 diabetes mellitus with diabetic neuropathy, unspecified: Secondary | ICD-10-CM

## 2019-08-30 DIAGNOSIS — E46 Unspecified protein-calorie malnutrition: Secondary | ICD-10-CM | POA: Diagnosis not present

## 2019-08-30 DIAGNOSIS — R441 Visual hallucinations: Secondary | ICD-10-CM | POA: Diagnosis not present

## 2019-08-30 DIAGNOSIS — R9089 Other abnormal findings on diagnostic imaging of central nervous system: Secondary | ICD-10-CM | POA: Diagnosis not present

## 2019-08-30 DIAGNOSIS — I5043 Acute on chronic combined systolic (congestive) and diastolic (congestive) heart failure: Secondary | ICD-10-CM | POA: Diagnosis present

## 2019-08-30 DIAGNOSIS — Z66 Do not resuscitate: Secondary | ICD-10-CM | POA: Diagnosis not present

## 2019-08-30 DIAGNOSIS — E1165 Type 2 diabetes mellitus with hyperglycemia: Secondary | ICD-10-CM | POA: Diagnosis present

## 2019-08-30 DIAGNOSIS — J189 Pneumonia, unspecified organism: Secondary | ICD-10-CM | POA: Diagnosis not present

## 2019-08-30 DIAGNOSIS — E872 Acidosis: Secondary | ICD-10-CM | POA: Diagnosis not present

## 2019-08-30 DIAGNOSIS — E11319 Type 2 diabetes mellitus with unspecified diabetic retinopathy without macular edema: Secondary | ICD-10-CM | POA: Diagnosis not present

## 2019-08-30 DIAGNOSIS — Z7984 Long term (current) use of oral hypoglycemic drugs: Secondary | ICD-10-CM | POA: Diagnosis not present

## 2019-08-30 DIAGNOSIS — I361 Nonrheumatic tricuspid (valve) insufficiency: Secondary | ICD-10-CM | POA: Diagnosis not present

## 2019-08-30 DIAGNOSIS — E785 Hyperlipidemia, unspecified: Secondary | ICD-10-CM | POA: Diagnosis present

## 2019-08-30 DIAGNOSIS — R410 Disorientation, unspecified: Secondary | ICD-10-CM | POA: Diagnosis not present

## 2019-08-30 DIAGNOSIS — E119 Type 2 diabetes mellitus without complications: Secondary | ICD-10-CM

## 2019-08-30 DIAGNOSIS — F0390 Unspecified dementia without behavioral disturbance: Secondary | ICD-10-CM | POA: Diagnosis present

## 2019-08-30 DIAGNOSIS — Z79899 Other long term (current) drug therapy: Secondary | ICD-10-CM | POA: Diagnosis not present

## 2019-08-30 DIAGNOSIS — I6389 Other cerebral infarction: Secondary | ICD-10-CM | POA: Diagnosis not present

## 2019-08-30 DIAGNOSIS — R0602 Shortness of breath: Secondary | ICD-10-CM | POA: Diagnosis not present

## 2019-08-30 DIAGNOSIS — I255 Ischemic cardiomyopathy: Secondary | ICD-10-CM | POA: Diagnosis present

## 2019-08-30 DIAGNOSIS — I471 Supraventricular tachycardia: Secondary | ICD-10-CM | POA: Diagnosis not present

## 2019-08-30 LAB — COMPREHENSIVE METABOLIC PANEL
ALT: 13 U/L (ref 0–44)
AST: 22 U/L (ref 15–41)
Albumin: 2.9 g/dL — ABNORMAL LOW (ref 3.5–5.0)
Alkaline Phosphatase: 79 U/L (ref 38–126)
Anion gap: 11 (ref 5–15)
BUN: 14 mg/dL (ref 8–23)
CO2: 20 mmol/L — ABNORMAL LOW (ref 22–32)
Calcium: 9.3 mg/dL (ref 8.9–10.3)
Chloride: 102 mmol/L (ref 98–111)
Creatinine, Ser: 1.02 mg/dL — ABNORMAL HIGH (ref 0.44–1.00)
GFR calc Af Amer: 59 mL/min — ABNORMAL LOW (ref >60)
GFR calc non Af Amer: 51 mL/min — ABNORMAL LOW (ref >60)
Glucose, Bld: 244 mg/dL — ABNORMAL HIGH (ref 70–99)
Potassium: 4.3 mmol/L (ref 3.5–5.1)
Sodium: 133 mmol/L — ABNORMAL LOW (ref 135–145)
Total Bilirubin: 0.7 mg/dL (ref 0.3–1.2)
Total Protein: 6.1 g/dL — ABNORMAL LOW (ref 6.5–8.1)

## 2019-08-30 LAB — CBC WITH DIFFERENTIAL/PLATELET
Abs Immature Granulocytes: 0.08 10*3/uL — ABNORMAL HIGH (ref 0.00–0.07)
Basophils Absolute: 0 10*3/uL (ref 0.0–0.1)
Basophils Relative: 0 %
Eosinophils Absolute: 0 10*3/uL (ref 0.0–0.5)
Eosinophils Relative: 0 %
HCT: 35.4 % — ABNORMAL LOW (ref 36.0–46.0)
Hemoglobin: 11 g/dL — ABNORMAL LOW (ref 12.0–15.0)
Immature Granulocytes: 1 %
Lymphocytes Relative: 20 %
Lymphs Abs: 1.9 10*3/uL (ref 0.7–4.0)
MCH: 28.6 pg (ref 26.0–34.0)
MCHC: 31.1 g/dL (ref 30.0–36.0)
MCV: 91.9 fL (ref 80.0–100.0)
Monocytes Absolute: 0.5 10*3/uL (ref 0.1–1.0)
Monocytes Relative: 6 %
Neutro Abs: 7.1 10*3/uL (ref 1.7–7.7)
Neutrophils Relative %: 73 %
Platelets: 343 10*3/uL (ref 150–400)
RBC: 3.85 MIL/uL — ABNORMAL LOW (ref 3.87–5.11)
RDW: 13.4 % (ref 11.5–15.5)
WBC: 9.7 10*3/uL (ref 4.0–10.5)
nRBC: 0 % (ref 0.0–0.2)

## 2019-08-30 LAB — TROPONIN I (HIGH SENSITIVITY)
Troponin I (High Sensitivity): 70 ng/L — ABNORMAL HIGH (ref <18)
Troponin I (High Sensitivity): 74 ng/L — ABNORMAL HIGH (ref <18)
Troponin I (High Sensitivity): 76 ng/L — ABNORMAL HIGH (ref <18)
Troponin I (High Sensitivity): 81 ng/L — ABNORMAL HIGH (ref <18)

## 2019-08-30 LAB — D-DIMER, QUANTITATIVE: D-Dimer, Quant: 1.67 ug/mL-FEU — ABNORMAL HIGH (ref 0.00–0.50)

## 2019-08-30 LAB — RESPIRATORY PANEL BY RT PCR (FLU A&B, COVID)
Influenza A by PCR: NEGATIVE
Influenza B by PCR: NEGATIVE
SARS Coronavirus 2 by RT PCR: NEGATIVE

## 2019-08-30 LAB — GLUCOSE, CAPILLARY: Glucose-Capillary: 213 mg/dL — ABNORMAL HIGH (ref 70–99)

## 2019-08-30 LAB — BRAIN NATRIURETIC PEPTIDE: B Natriuretic Peptide: 2650.2 pg/mL — ABNORMAL HIGH (ref 0.0–100.0)

## 2019-08-30 MED ORDER — METOPROLOL SUCCINATE ER 50 MG PO TB24
50.0000 mg | ORAL_TABLET | Freq: Two times a day (BID) | ORAL | Status: DC
  Administered 2019-08-30 – 2019-08-31 (×3): 50 mg via ORAL
  Filled 2019-08-30 (×4): qty 1

## 2019-08-30 MED ORDER — PAROXETINE HCL 20 MG PO TABS
40.0000 mg | ORAL_TABLET | Freq: Every day | ORAL | Status: DC
  Administered 2019-08-30 – 2019-08-31 (×2): 40 mg via ORAL
  Filled 2019-08-30 (×2): qty 2

## 2019-08-30 MED ORDER — ZOLPIDEM TARTRATE 5 MG PO TABS
5.0000 mg | ORAL_TABLET | Freq: Once | ORAL | Status: AC
  Administered 2019-08-31: 5 mg via ORAL
  Filled 2019-08-30: qty 1

## 2019-08-30 MED ORDER — INSULIN ASPART 100 UNIT/ML ~~LOC~~ SOLN
0.0000 [IU] | Freq: Three times a day (TID) | SUBCUTANEOUS | Status: DC
  Administered 2019-08-31: 2 [IU] via SUBCUTANEOUS
  Administered 2019-08-31 (×2): 3 [IU] via SUBCUTANEOUS

## 2019-08-30 MED ORDER — FUROSEMIDE 10 MG/ML IJ SOLN
40.0000 mg | Freq: Every day | INTRAMUSCULAR | Status: DC
  Administered 2019-08-31: 40 mg via INTRAVENOUS
  Filled 2019-08-30: qty 4

## 2019-08-30 MED ORDER — FAMOTIDINE 20 MG PO TABS
40.0000 mg | ORAL_TABLET | Freq: Every day | ORAL | Status: DC
  Administered 2019-08-30 – 2019-08-31 (×2): 40 mg via ORAL
  Filled 2019-08-30 (×2): qty 2

## 2019-08-30 MED ORDER — GLIPIZIDE ER 10 MG PO TB24
10.0000 mg | ORAL_TABLET | Freq: Every day | ORAL | Status: DC
  Administered 2019-08-31: 10 mg via ORAL
  Filled 2019-08-30 (×2): qty 1

## 2019-08-30 MED ORDER — NITROGLYCERIN 0.4 MG SL SUBL: 0.4000 mg | SUBLINGUAL_TABLET | SUBLINGUAL | Status: DC | PRN

## 2019-08-30 MED ORDER — ATORVASTATIN CALCIUM 10 MG PO TABS
20.0000 mg | ORAL_TABLET | Freq: Every day | ORAL | Status: DC
  Administered 2019-08-30 – 2019-08-31 (×2): 20 mg via ORAL
  Filled 2019-08-30 (×2): qty 2

## 2019-08-30 MED ORDER — METFORMIN HCL 500 MG PO TABS
1000.0000 mg | ORAL_TABLET | Freq: Two times a day (BID) | ORAL | Status: DC
  Administered 2019-08-31 (×2): 1000 mg via ORAL
  Filled 2019-08-30 (×3): qty 2

## 2019-08-30 MED ORDER — FUROSEMIDE 10 MG/ML IJ SOLN
40.0000 mg | Freq: Once | INTRAMUSCULAR | Status: AC
  Administered 2019-08-30: 40 mg via INTRAVENOUS
  Filled 2019-08-30: qty 4

## 2019-08-30 MED ORDER — METOPROLOL TARTRATE 5 MG/5ML IV SOLN
2.5000 mg | Freq: Once | INTRAVENOUS | Status: AC
  Administered 2019-08-30: 2.5 mg via INTRAVENOUS
  Filled 2019-08-30: qty 5

## 2019-08-30 MED ORDER — ONDANSETRON HCL 4 MG/2ML IJ SOLN: 4.0000 mg | Freq: Four times a day (QID) | INTRAMUSCULAR | Status: DC | PRN

## 2019-08-30 MED ORDER — APIXABAN 2.5 MG PO TABS
2.5000 mg | ORAL_TABLET | Freq: Two times a day (BID) | ORAL | Status: DC
  Administered 2019-08-30 – 2019-09-01 (×4): 2.5 mg via ORAL
  Filled 2019-08-30 (×4): qty 1

## 2019-08-30 MED ORDER — FUROSEMIDE 10 MG/ML IJ SOLN: 40.0000 mg | Freq: Every day | INTRAMUSCULAR | Status: DC

## 2019-08-30 MED ORDER — ACETAMINOPHEN 325 MG PO TABS: 650.0000 mg | ORAL_TABLET | ORAL | Status: DC | PRN

## 2019-08-30 NOTE — Progress Notes (Addendum)
Patient refused purewick. Will measure output with fitted hat to comply with measurement order. Mews score 2 due to tachycardia. MD notified for tachycardia ranging between 120-145 since arrival to unit, will give scheduled metoprolol and monitor for improvement.  Addendum: heart rate as of 2223 ranges between 106 and 125.

## 2019-08-30 NOTE — ED Provider Notes (Signed)
[facility] EMERGENCY DEPARTMENT Provider Note   CSN: [id number] Arrival date & time: 08/21/2019  1414     History Chief Complaint  Patient presents with  . Chest Pain  . Shortness of Breath    Stephanie [name] is a 86 y.o. female.  The history is provided by the patient and medical records.  The patient is a 86 year old female with past medical history of CAD, carotid artery disease, diabetes, hypertension, hyperlipidemia, SVT who presents to the ED for shortness of breath and chest pain.  Her symptoms started this morning upon awakening, reports having palpitations in addition to her chest pain or shortness of breath, her chest pain is described as tightness, nonradiating, worse with exertion, better with rest.  Patient reports that she has a history of atrial fibrillation however I can find no evidence of this documented in the chart and the patient is not on any anticoagulant medications.  No nausea, vomiting, diarrhea, fever, chills, cough.     Past Medical History:  Diagnosis Date  . Allergy    allergic rhinitis  . Anxiety   . Arthritis    osteoarthritis  . CAD (coronary artery disease)    a. s/p prior stenting of the LAD and D1 (02/2004); b. 02/2019 NSTEMI in setting of PSVT/Cath: >M 37m/d, LAD 40ost/p/m, D1 60, LCX 53m, 50d, RCA  nl-->Med Rx.  . Carotid arterial disease ([place])    a. 02/2019 Carotid U/S: RICA 123XX123, LICA [id number]. Antegrade bilat vert flow.  . Degenerative disk disease    in neck  . Diabetes mellitus    type II  . Fracture of femoral neck, left ([place]) 01/21/2018  . Hyperlipidemia   . Hypertension   . Hypoaldosteronism ([place]) 6/12   from DM causing hyperkalemia   . Hyponatremia    a. 02/2019 in setting of chlorthalidone rx (d/c'd).  . Ischemic cardiomyopathy    a. 11/2016 Echo: EF 55-60%, Gr1 DD; b. 02/2019 Echo: EF 45-50%, sev basal-mid infsept, inf, inflat HK. DD. Sev dil LA.  . Osteopenia   . Periodontal disease   . Peripheral vascular disease  ([place])   . PSVT (paroxysmal supraventricular tachycardia) ([place])    a. 02/2019 - in absence of BB therapy-->broke w/ adenosine.  . Stroke [facility])    a. 02/2019 MRI: L temporal infarct (1 day following cardiac cath).  . Transient cerebral ischemia 11/06/2016    Patient Active Problem List   Diagnosis Date Noted  . Episodic confusion 07/20/2019  . Frequency of urination 07/20/2019  . Vivid dream 07/20/2019  . Pain due to onychomycosis of toenails of both feet 03/29/2019  . Type 2 diabetes mellitus with vascular disease ([place]) 03/29/2019  . Onychomycosis of multiple toenails with type 2 diabetes mellitus and peripheral neuropathy ([place]) 03/29/2019  . Estrogen deficiency 03/26/2019  . Demand ischemia ([place]) 02/17/2019  . Acute CVA (cerebrovascular accident) ([place]) 02/17/2019  . Non-ST elevation (NSTEMI) myocardial infarction ([place]) 02/16/2019  . NSTEMI (non-ST elevated myocardial infarction) ([place])   . Colonic diverticular abscess 06/05/2018  . Anemia 06/05/2018  . Protein calorie malnutrition ([place]) 06/05/2018  . Closed fracture of left hip ([place])   . Chronic kidney disease (CKD), stage II (mild) 01/22/2018  . Fracture of femoral neck, left ([place]) 01/21/2018  . GERD (gastroesophageal reflux disease) 01/21/2018  . CAD (coronary artery disease) 01/21/2018  . Subcapital fracture of left hip ([place]) 01/21/2018  . Carotid stenosis, bilateral 11/20/2016  . Transient cerebral ischemia 11/06/2016  . Vitamin D deficiency 09/04/2015  .  Routine general medical examination at a health care facility 09/04/2015  . Encounter for Medicare annual wellness exam 05/30/2014  . Colon cancer screening 05/30/2014  . History of fall 05/26/2013  . Gynecological examination 02/20/2011  . HELICOBACTER PYLORI INFECTION 12/19/2006  . HSV 12/19/2006  . Type 2 diabetes mellitus ([place]) 12/19/2006  . Diabetic retinopathy ([place]) 12/19/2006  . Combined hyperlipidemia associated with type 2 diabetes mellitus ([place]) 12/19/2006  .  Anxiety and depression 12/19/2006  . HTN (hypertension) 12/19/2006  . Coronary artery disease of native artery of native heart with stable angina pectoris ([place]) 12/19/2006  . Peripheral vascular disease due to secondary diabetes mellitus ([place]) 12/19/2006  . ALLERGIC RHINITIS 12/19/2006  . OSTEOARTHRITIS 12/19/2006  . Osteopenia 12/19/2006    Past Surgical History:  Procedure Laterality Date  . CARDIAC CATHETERIZATION     x2 STENT  . CHOLECYSTECTOMY    . ESOPHAGOGASTRODUODENOSCOPY  03/2004   gastritis by biopsy  . GUM SURGERY  06/2010   laser surgery for gums  . HIP ARTHROPLASTY Left 01/21/2018   Procedure: ARTHROPLASTY BIPOLAR HIP (HEMIARTHROPLASTY);  Surgeon: Marchia Bond, MD;  Location: [facility];  Service: Orthopedics;  Laterality: Left;  . IR RADIOLOGIST EVAL & MGMT  06/17/2018  . LEFT HEART CATH AND CORONARY ANGIOGRAPHY N/A 02/16/2019   Procedure: LEFT HEART CATH AND CORONARY ANGIOGRAPHY;  Surgeon: Wellington Hampshire, MD;  Location: [facility];  Service: Cardiovascular;  Laterality: N/A;  . MM BREAST STEREO BX*L*R/S  12/12   normal  . TONSILLECTOMY    . TRANSTHORACIC ECHOCARDIOGRAM  11/2016   EF 55-60%, grade 1 DD with elevated LV end diastolic filling pressures, mildly dilated LA, mild TR and PR.   . TUBAL LIGATION       OB History   No obstetric history on file.     Family History  Problem Relation Age of Onset  . Cancer Mother        vaginal CA in situ  . Hypertension Mother   . Heart disease Mother        CAD and MI  . Heart disease Father        CAD  . Diabetes Father   . Heart disease Son        congenital valve problem    Social History   Tobacco Use  . Smoking status: Never Smoker  . Smokeless tobacco: Never Used  Substance Use Topics  . Alcohol use: No    Alcohol/week: 0.0 standard drinks  . Drug use: No    Home Medications Prior to Admission medications   Medication Sig Start Date End Date Taking? Authorizing Provider  atorvastatin  (LIPITOR) 20 MG tablet Take 1 tablet (20 mg total) by mouth daily. 07/22/19   Tower, Wynelle Fanny, MD  cephALEXin (KEFLEX) 500 MG capsule Take 1 capsule (500 mg total) by mouth 2 (two) times daily. 07/23/19   Tower, Wynelle Fanny, MD  dipyridamole-aspirin (AGGRENOX) 200-25 MG 12hr capsule Take 1 capsule by mouth twice daily Patient taking differently: Take 1 capsule by mouth 2 (two) times daily.  09/24/18   Tower, Wynelle Fanny, MD  famotidine (PEPCID) 40 MG tablet Take 1 tablet (40 mg total) by mouth daily. Patient taking differently: Take 40 mg by mouth See admin instructions. Take 40 mg by mouth one to two times a day 04/28/18   Thornton Park, MD  glipiZIDE (GLUCOTROL XL) 10 MG 24 hr tablet Take 1 tablet by mouth once daily with breakfast 03/16/19   Tower, [place]  A, MD  metFORMIN (GLUCOPHAGE) 1000 MG tablet TAKE 1 TABLET BY MOUTH TWICE DAILY WITH MEALS 08/18/19   Tower, Wynelle Fanny, MD  metoprolol succinate (TOPROL-XL) 100 MG 24 hr tablet TAKE 1 TABLET BY MOUTH ONCE DAILY TAKE  WITH  OR  IMMEDIATELY  FOLLOWING  A  MEAL 05/20/19   Tower, Wynelle Fanny, MD  nitroGLYCERIN (NITROSTAT) 0.4 MG SL tablet Place 1 tablet (0.4 mg total) under the tongue every 5 (five) minutes x 3 doses as needed for chest pain. 02/17/19   Sande Rives E, PA-C  PARoxetine (PAXIL) 40 MG tablet Take 1 tablet (40 mg total) by mouth at bedtime. 03/26/19   Tower, Wynelle Fanny, MD    Allergies    Ace inhibitors, Amlodipine besy-benazepril hcl, Clopidogrel bisulfate, Erythromycin, Pneumovax 23 [pneumococcal vac polyvalent], and Tape  Review of Systems   Review of Systems  Constitutional: Negative for chills and fever.  Respiratory: Positive for shortness of breath. Negative for cough.   Cardiovascular: Positive for chest pain and palpitations. Negative for leg swelling.  Gastrointestinal: Negative for abdominal pain, nausea and vomiting.  All other systems reviewed and are negative.   Physical Exam Updated Vital Signs SpO2 100%   Physical Exam Vitals and  nursing note reviewed.  Constitutional:      Appearance: She is well-developed. She is not toxic-appearing or diaphoretic.  HENT:     Head: Normocephalic and atraumatic.  Eyes:     Conjunctiva/sclera: Conjunctivae normal.  Cardiovascular:     Rate and Rhythm: Tachycardia present. Rhythm irregular.     Pulses:          Radial pulses are 2+ on the right side and 2+ on the left side.       Dorsalis pedis pulses are 2+ on the right side and 2+ on the left side.     Heart sounds: No murmur.  Pulmonary:     Effort: Pulmonary effort is normal. No respiratory distress.     Breath sounds: Examination of the right-lower field reveals rales. Examination of the left-lower field reveals rales. Rales present.  Chest:     Chest wall: No tenderness.  Abdominal:     Palpations: Abdomen is soft.     Tenderness: There is no abdominal tenderness.  Musculoskeletal:        General: No tenderness or deformity.     Cervical back: Neck supple.  Skin:    General: Skin is warm and dry.  Neurological:     Mental Status: She is alert.     ED Results / Procedures / Treatments   Labs (all labs ordered are listed, but only abnormal results are displayed) Labs Reviewed  CBC WITH DIFFERENTIAL/PLATELET - Abnormal; Notable for the following components:      Result Value   RBC 3.85 (*)    Hemoglobin 11.0 (*)    HCT 35.4 (*)    Abs Immature Granulocytes 0.08 (*)    All other components within normal limits  COMPREHENSIVE METABOLIC PANEL - Abnormal; Notable for the following components:   Sodium 133 (*)    CO2 20 (*)    Glucose, Bld 244 (*)    Creatinine, Ser 1.02 (*)    Total Protein 6.1 (*)    Albumin 2.9 (*)    GFR calc non Af Amer 51 (*)    GFR calc Af Amer 59 (*)    All other components within normal limits  BRAIN NATRIURETIC PEPTIDE - Abnormal; Notable for the following components:   B  Natriuretic Peptide 2,650.2 (*)    All other components within normal limits  D-DIMER, QUANTITATIVE (NOT AT  [facility]) - Abnormal; Notable for the following components:   D-Dimer, Quant 1.67 (*)    All other components within normal limits  GLUCOSE, CAPILLARY - Abnormal; Notable for the following components:   Glucose-Capillary 213 (*)    All other components within normal limits  TROPONIN I (HIGH SENSITIVITY) - Abnormal; Notable for the following components:   Troponin I (High Sensitivity) 70 (*)    All other components within normal limits  TROPONIN I (HIGH SENSITIVITY) - Abnormal; Notable for the following components:   Troponin I (High Sensitivity) 74 (*)    All other components within normal limits  TROPONIN I (HIGH SENSITIVITY) - Abnormal; Notable for the following components:   Troponin I (High Sensitivity) 76 (*)    All other components within normal limits  TROPONIN I (HIGH SENSITIVITY) - Abnormal; Notable for the following components:   Troponin I (High Sensitivity) 81 (*)    All other components within normal limits  RESPIRATORY PANEL BY RT PCR (FLU A&B, COVID)  BASIC METABOLIC PANEL    EKG     Radiology DG Chest Portable 1 View  Result Date: 08/17/2019 CLINICAL DATA:  Shortness of breath and chest pain EXAM: PORTABLE CHEST 1 VIEW COMPARISON:  02/15/2019 FINDINGS: Cardiac shadow is stable. Aortic calcifications are again seen. Diffuse vascular congestion is noted with interstitial edema. Small effusions are noted as well. Likely underlying atelectasis is seen as well. No bony abnormality is noted. IMPRESSION: Changes consistent with CHF. Bibasilar atelectatic changes are also noted. Electronically Signed   By: Inez Catalina M.D.   On: 08/22/2019 16:46    Procedures Procedures (including critical care time)  Medications Ordered in ED 2.5mg  iv metoprolol  ED Course  I have reviewed the triage vital signs and the nursing notes.  Pertinent labs & imaging results that were available during my care of the patient were reviewed by me and considered in my medical decision making (see  chart for details).    MDM Rules/Calculators/A&P                      Here for chest pain, shortness of breath, palpitations.  Appears to be in atrial fibrillation with rapid ventricular response on arrival, no evidence of previous atrial fibrillation on chart review.  Given IV metoprolol x1 with some improvement.  With possible new onset atrial fibrillation and evidence of heart failure on chest x-ray will consult cardiology for admission.  Pt admitted to Dr. Acie Fredrickson (cardiology).   Final Clinical Impression(s) / ED Diagnoses Final diagnoses:  Atrial fibrillation, unspecified type ([place])  Acute heart failure, unspecified heart failure type [facility])    Rx / [place] Orders ED Discharge Orders    None       Quinlynn Cuthbert, [place], MD 08/31/19 0120    Wyvonnia Dusky, MD 08/31/19 1218

## 2019-08-30 NOTE — ED Notes (Signed)
Got patient undress into a gown on the monitor got patient warm blanket patient is resting with call bell in reach

## 2019-08-30 NOTE — ED Triage Notes (Signed)
Pt reports getting the covid vaccine Saturday and had arm soreness yesterday. Pt woke up today and had central CP, SOB and body aches.

## 2019-08-30 NOTE — H&P (Signed)
Cardiology Admission History and Physical:   Patient ID: Stephanie [name] MRN: [id number]; DOB: 05-29-1934   Admission date: 08/20/2019  Primary Care Provider: Abner Greenspan, MD Primary Cardiologist: Stephanie Rogue, MD  Primary Electrophysiologist:  None   Chief Complaint:    Patient Profile:   Stephanie [name] is a 86 y.o. female with history of coronary artery disease, carotid artery disease, type 2 diabetes mellitus, hyperlipidemia, hypertension who was admitted with shortness of breath.  History of Present Illness:   Stephanie [name] is an 85 year old female who is typically seen by Stephanie [name] in our [place] office.  She presents tonight for further evaluation of some increasing shortness of breath.  She was last seen by Stephanie [name] in November, 2020.  She has a history of a remote stroke and has been on Aggrenox. 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  She is admitted to the hospital in August, 2020 with supraventricular tachycardia at a heart rate of 196.  She converted to normal sinus rhythm with adenosine 6 mg.  Troponins were elevated and cath was recommended. Heart catheterization later that admission revealed a 50% left main stenosis, ostial LAD stenosis of 40%, sequential 40% then 50% left circumflex stenosis.  Medical therapy was recommended.  The patient has become very sedentary recently.  She used to be very active but now does not do any exercise.  She received her first Covid vaccine 2 days ago. She became somewhat confused and stopped all of her medications 5 to 7 days ago. She woke up this morning very short of breath.  She has a cough.  She describes pleuritic chest pain.  She denies coughing up any sputum or blood.  She denies any diarrhea or constipation.  She denies any leg edema.  She eats a very high salt diet.  She eats bologna sandwiches almost every day for lunch and goes out to the [employer] frequently through  the week.  She presented to the ER with severe shortness of breath. . She denies any syncope or presyncope.  She denies any rash or skin nodules.  She received her Covid vaccine on Saturday and has some arm soreness but is otherwise doing okay. She denies any aches and pains. She admits to not eating well for the past several days but has continued to eat salty foods when she does eat.  She stopped all of her medications including her diabetic medicines 5 to 7 days ago.  Heart Pathway Score:     Past Medical History:  Diagnosis Date  . Allergy    allergic rhinitis  . Anxiety   . Arthritis    osteoarthritis  . CAD (coronary artery disease)    a. s/p prior stenting of the LAD and D1 (02/2004); b. 02/2019 NSTEMI in setting of PSVT/Cath: >M 44m/d, LAD 40ost/p/m, D1 60, LCX 21m, 50d, RCA  nl-->Med Rx.  . Carotid arterial disease ([place])    a. 02/2019 Carotid U/S: RICA 123XX123, LICA [id number]. Antegrade bilat vert flow.  . Degenerative disk disease    in neck  . Diabetes mellitus    type II  . Fracture of femoral neck, left ([place]) 01/21/2018  . Hyperlipidemia   . Hypertension   . Hypoaldosteronism ([place]) 6/12   from DM causing hyperkalemia   . Hyponatremia    a. 02/2019 in setting of chlorthalidone rx (d/c'd).  . Ischemic cardiomyopathy    a. 11/2016 Echo: EF 55-60%, Gr1 DD;  b. 02/2019 Echo: EF 45-50%, sev basal-mid infsept, inf, inflat HK. DD. Sev dil LA.  . Osteopenia   . Periodontal disease   . Peripheral vascular disease ([place])   . PSVT (paroxysmal supraventricular tachycardia) ([place])    a. 02/2019 - in absence of BB therapy-->broke w/ adenosine.  . Stroke [facility])    a. 02/2019 MRI: L temporal infarct (1 day following cardiac cath).  . Transient cerebral ischemia 11/06/2016    Past Surgical History:  Procedure Laterality Date  . CARDIAC CATHETERIZATION     x2 STENT  . CHOLECYSTECTOMY    . ESOPHAGOGASTRODUODENOSCOPY  03/2004   gastritis by biopsy  . GUM SURGERY  06/2010   laser surgery for  gums  . HIP ARTHROPLASTY Left 01/21/2018   Procedure: ARTHROPLASTY BIPOLAR HIP (HEMIARTHROPLASTY);  Surgeon: Stephanie Bond, MD;  Location: [place];  Service: Orthopedics;  Laterality: Left;  . IR RADIOLOGIST EVAL & MGMT  06/17/2018  . LEFT HEART CATH AND CORONARY ANGIOGRAPHY N/A 02/16/2019   Procedure: LEFT HEART CATH AND CORONARY ANGIOGRAPHY;  Surgeon: Stephanie Hampshire, MD;  Location: [place] [facility];  Service: Cardiovascular;  Laterality: N/A;  . MM BREAST STEREO BX*L*R/S  12/12   normal  . TONSILLECTOMY    . TRANSTHORACIC ECHOCARDIOGRAM  11/2016   EF 55-60%, grade 1 DD with elevated LV end diastolic filling pressures, mildly dilated LA, mild TR and PR.   . TUBAL LIGATION       Medications Prior to Admission: Prior to Admission medications   Medication Sig Start Date End Date Taking? Authorizing Provider  atorvastatin (LIPITOR) 20 MG tablet Take 1 tablet (20 mg total) by mouth daily. 07/22/19   Tower, Wynelle Fanny, MD  cephALEXin (KEFLEX) 500 MG capsule Take 1 capsule (500 mg total) by mouth 2 (two) times daily. 07/23/19   Tower, Wynelle Fanny, MD  dipyridamole-aspirin (AGGRENOX) 200-25 MG 12hr capsule Take 1 capsule by mouth twice daily Patient taking differently: Take 1 capsule by mouth 2 (two) times daily.  09/24/18   Tower, Wynelle Fanny, MD  famotidine (PEPCID) 40 MG tablet Take 1 tablet (40 mg total) by mouth daily. Patient taking differently: Take 40 mg by mouth See admin instructions. Take 40 mg by mouth one to two times a day 04/28/18   Thornton Park, MD  glipiZIDE (GLUCOTROL XL) 10 MG 24 hr tablet Take 1 tablet by mouth once daily with breakfast 03/16/19   Tower, Wynelle Fanny, MD  metFORMIN (GLUCOPHAGE) 1000 MG tablet TAKE 1 TABLET BY MOUTH TWICE DAILY WITH MEALS 08/18/19   Tower, Wynelle Fanny, MD  metoprolol succinate (TOPROL-XL) 100 MG 24 hr tablet TAKE 1 TABLET BY MOUTH ONCE DAILY TAKE  WITH  OR  IMMEDIATELY  FOLLOWING  A  MEAL 05/20/19   Tower, Wynelle Fanny, MD  nitroGLYCERIN (NITROSTAT) 0.4 MG SL tablet Place 1  tablet (0.4 mg total) under the tongue every 5 (five) minutes x 3 doses as needed for chest pain. 02/17/19   Stephanie Rives E, PA-C  PARoxetine (PAXIL) 40 MG tablet Take 1 tablet (40 mg total) by mouth at bedtime. 03/26/19   Tower, Wynelle Fanny, MD     Allergies:    Allergies  Allergen Reactions  . Ace Inhibitors Other (See Comments)    Chest pain  . Amlodipine Besy-Benazepril Hcl Other (See Comments)    Chest pain  . Clopidogrel Bisulfate Other (See Comments)    GI upset  . Erythromycin Other (See Comments)    GI upset  . Pneumovax 23 [  Pneumococcal Vac Polyvalent]     Local reaction   . Tape Other (See Comments)    Causes soreness (of the skin)    Social History:   Social History   Socioeconomic History  . Marital status: Married    Spouse name: Luvenia Heller  . Number of children: 3  . Years of education: Not on file  . Highest education level: Not on file  Occupational History  . Occupation: retired   Tobacco Use  . Smoking status: Never Smoker  . Smokeless tobacco: Never Used  Substance and Sexual Activity  . Alcohol use: No    Alcohol/week: 0.0 standard drinks  . Drug use: No  . Sexual activity: Not on file  Other Topics Concern  . Not on file  Social History Narrative   Mrs Mangiapane is a 86 year old retired female living at home with her husband, Luvenia Heller and her dog. She voices being independent with ADLs and independent/assist with iADLs She has her husband to assist with transportation to medical appointments      Social Determinants of Health   Financial Resource Strain: Low Risk   . Difficulty of Paying Living Expenses: Not hard at all  Food Insecurity: No Food Insecurity  . Worried About Charity fundraiser in the Last Year: Never true  . Ran Out of Food in the Last Year: Never true  Transportation Needs: No Transportation Needs  . Lack of Transportation (Medical): No  . Lack of Transportation (Non-Medical): No  Physical Activity: Inactive  . Days of Exercise per Week: 0  days  . Minutes of Exercise per Session: 0 min  Stress: No Stress Concern Present  . Feeling of Stress : Not at all  Social Connections: Not Isolated  . Frequency of Communication with Friends and Family: More than three times a week  . Frequency of Social Gatherings with Friends and Family: More than three times a week  . Attends Religious Services: More than 4 times per year  . Active Member of Clubs or Organizations: Yes  . Attends Archivist Meetings: 1 to 4 times per year  . Marital Status: Married  Human resources officer Violence:   . Fear of Current or Ex-Partner: Not on file  . Emotionally Abused: Not on file  . Physically Abused: Not on file  . Sexually Abused: Not on file    Family History:   The patient's family history includes Cancer in her mother; Diabetes in her father; Heart disease in her father, mother, and son; Hypertension in her mother.    ROS:  Please see the history of present illness.  All other ROS reviewed and negative.     Physical Exam/Data:   Vitals:   09/06/2019 1424 09/10/2019 1645  BP:  (!) 155/92  Pulse:  63  Resp:  (!) 26  SpO2: 100% 93%   No intake or output data in the 24 hours ending 09/02/2019 1729 Last 3 Weights 07/20/2019 05/18/2019 03/26/2019  Weight (lbs) 104 lb 9 oz 109 lb 109 lb 2 oz  Weight (kg) 47.429 kg 49.442 kg 49.499 kg     There is no height or weight on file to calculate BMI.  General:  Chronically ill appearing, pale female HEENT: normal Lymph: no adenopathy Neck: no JVD Endocrine:  No thryomegaly Vascular: No carotid bruits; FA pulses 2+ bilaterally without bruits  Cardiac:  Irreg. Irreg. Tachycardic  Lungs:  Rales bilaterally in the bases  Abd: soft, nontender, no hepatomegaly  Ext: no edema  Musculoskeletal:  No deformities, BUE and BLE strength normal and equal Skin: warm and dry  Neuro:  CNs 2-12 intact, no focal abnormalities noted Psych:  Normal affect    EKG:   Atrial fibrillation with a heart rate of 153.   She has nonspecific ST and T wave changes.  Relevant CV Studies:   Laboratory Data:  High Sensitivity Troponin:  No results for input(s): TROPONINIHS in the last 720 hours.    ChemistryNo results for input(s): NA, K, CL, CO2, GLUCOSE, BUN, CREATININE, CALCIUM, GFRNONAA, GFRAA, ANIONGAP in the last 168 hours.  No results for input(s): PROT, ALBUMIN, AST, ALT, ALKPHOS, BILITOT in the last 168 hours. Hematology Recent Labs  Lab 08/24/2019 1645  WBC 9.7  RBC 3.85*  HGB 11.0*  HCT 35.4*  MCV 91.9  MCH 28.6  MCHC 31.1  RDW 13.4  PLT 343   BNPNo results for input(s): BNP, PROBNP in the last 168 hours.  DDimer No results for input(s): DDIMER in the last 168 hours.   Radiology/Studies:  DG Chest Portable 1 View  Result Date: 08/26/2019 CLINICAL DATA:  Shortness of breath and chest pain EXAM: PORTABLE CHEST 1 VIEW COMPARISON:  02/15/2019 FINDINGS: Cardiac shadow is stable. Aortic calcifications are again seen. Diffuse vascular congestion is noted with interstitial edema. Small effusions are noted as well. Likely underlying atelectasis is seen as well. No bony abnormality is noted. IMPRESSION: Changes consistent with CHF. Bibasilar atelectatic changes are also noted. Electronically Signed   By: Inez Catalina M.D.   On: 08/16/2019 16:46     Assessment and Plan:   1. Atrial fibrillation: The patient presents with what appears to be new onset atrial fibrillation.  She has been found to have supraventricular tachycardia that resolved with IV adenosine.  Her rate was very rapid on admission.  She is received some IV metoprolol and her heart rate is somewhat slower.  She has evidence of CHF on exam with cardiomegaly and bilateral infiltrates.  We will restart her metoprolol 50 mg twice a day.  She will need be started on Eliquis.  2.  Acute combined systolic and diastolic congestive heart failure: The patient has evidence of CHF on exam.  Her echocardiogram from August 2020 reveals mildly  depressed left ventricular systolic function with ejection fraction of 45 to 50%.  Rapid ventricular response.I suspect she may have developed some worsening congestive heart failure because of her.  We will start her on  Lasix 40 mg r tonight and then 40 mg a day.  3.  Acute respiratory syndrome: We will check Covid test.  Will check a D-dimer.  She denies any fever.  Her white count is normal.  This all could be due to progressive congestive heart failure because of rapid atrial fibrillation.  We will continue to work on rate control and will start her on Lasix.  4.  Coronary artery disease.  She has mild to moderate coronary artery disease.  She does not have any stents placed.  5.  Hyperlipidemia: Continue atorvastatin.  6.  Diabetes mellitus: We will restart current medications.  We will get a diabetic coordinator consultation.  She may need some sliding scale tonight.    Severity of Illness: The appropriate patient status for this patient is INPATIENT. Inpatient status is judged to be reasonable and necessary in order to provide the required intensity of service to ensure the patient's safety. The patient's presenting symptoms, physical exam findings, and initial radiographic and laboratory data in the context of  their chronic comorbidities is felt to place them at high risk for further clinical deterioration. Furthermore, it is not anticipated that the patient will be medically stable for discharge from the hospital within 2 midnights of admission. The following factors support the patient status of inpatient.   " The patient's presenting symptoms include  CHF, rapid atrial fib . " The worrisome physical exam findings include CHF, rapid atrial fib . " The initial radiographic and laboratory data are worrisome because of  CHF on  CXR. . " The chronic co-morbidities include  Age, CAD, atrial fib, diabetes, hx of CVA .   A hight level of decision making was required in the admission of this  patient  * I certify that at the point of admission it is my clinical judgment that the patient will require inpatient hospital care spanning beyond 2 midnights from the point of admission due to high intensity of service, high risk for further deterioration and high frequency of surveillance required.*    For questions or updates, please contact [place] Please consult [email or web address] for contact info under        Signed, Mertie Moores, MD  09/12/2019 5:29 PM

## 2019-08-31 ENCOUNTER — Other Ambulatory Visit: Payer: Self-pay

## 2019-08-31 ENCOUNTER — Inpatient Hospital Stay (HOSPITAL_COMMUNITY): Payer: Medicare Other

## 2019-08-31 DIAGNOSIS — E11319 Type 2 diabetes mellitus with unspecified diabetic retinopathy without macular edema: Secondary | ICD-10-CM

## 2019-08-31 DIAGNOSIS — I4819 Other persistent atrial fibrillation: Secondary | ICD-10-CM

## 2019-08-31 DIAGNOSIS — I361 Nonrheumatic tricuspid (valve) insufficiency: Secondary | ICD-10-CM

## 2019-08-31 DIAGNOSIS — R441 Visual hallucinations: Secondary | ICD-10-CM | POA: Diagnosis present

## 2019-08-31 DIAGNOSIS — R410 Disorientation, unspecified: Secondary | ICD-10-CM

## 2019-08-31 DIAGNOSIS — G9340 Encephalopathy, unspecified: Secondary | ICD-10-CM | POA: Diagnosis present

## 2019-08-31 DIAGNOSIS — I340 Nonrheumatic mitral (valve) insufficiency: Secondary | ICD-10-CM

## 2019-08-31 LAB — BASIC METABOLIC PANEL
Anion gap: 13 (ref 5–15)
BUN: 18 mg/dL (ref 8–23)
CO2: 20 mmol/L — ABNORMAL LOW (ref 22–32)
Calcium: 9.3 mg/dL (ref 8.9–10.3)
Chloride: 101 mmol/L (ref 98–111)
Creatinine, Ser: 1.19 mg/dL — ABNORMAL HIGH (ref 0.44–1.00)
GFR calc Af Amer: 49 mL/min — ABNORMAL LOW (ref >60)
GFR calc non Af Amer: 42 mL/min — ABNORMAL LOW (ref >60)
Glucose, Bld: 300 mg/dL — ABNORMAL HIGH (ref 70–99)
Potassium: 4.6 mmol/L (ref 3.5–5.1)
Sodium: 134 mmol/L — ABNORMAL LOW (ref 135–145)

## 2019-08-31 LAB — GLUCOSE, CAPILLARY
Glucose-Capillary: 196 mg/dL — ABNORMAL HIGH (ref 70–99)
Glucose-Capillary: 236 mg/dL — ABNORMAL HIGH (ref 70–99)
Glucose-Capillary: 206 mg/dL — ABNORMAL HIGH (ref 70–99)
Glucose-Capillary: 168 mg/dL — ABNORMAL HIGH (ref 70–99)
Glucose-Capillary: 132 mg/dL — ABNORMAL HIGH (ref 70–99)

## 2019-08-31 LAB — PROCALCITONIN: Procalcitonin: <0.1 ng/mL

## 2019-08-31 LAB — ECHOCARDIOGRAM COMPLETE
Height: 60 in
Weight: 1710.4 oz

## 2019-08-31 MED ORDER — FAMOTIDINE 20 MG PO TABS
20.0000 mg | ORAL_TABLET | Freq: Every day | ORAL | Status: DC
  Administered 2019-09-01: 20 mg via ORAL
  Filled 2019-08-31: qty 1

## 2019-08-31 MED ORDER — IOHEXOL 350 MG/ML SOLN
100.0000 mL | Freq: Once | INTRAVENOUS | Status: AC | PRN
  Administered 2019-08-31: 100 mL via INTRAVENOUS

## 2019-08-31 MED ORDER — ALUM & MAG HYDROXIDE-SIMETH 200-200-20 MG/5ML PO SUSP
30.0000 mL | Freq: Once | ORAL | Status: DC
  Filled 2019-08-31: qty 30

## 2019-08-31 MED ORDER — INSULIN ASPART 100 UNIT/ML ~~LOC~~ SOLN
0.0000 [IU] | Freq: Three times a day (TID) | SUBCUTANEOUS | Status: DC
  Administered 2019-08-31: 3 [IU] via SUBCUTANEOUS
  Administered 2019-09-01: 2 [IU] via SUBCUTANEOUS
  Administered 2019-09-01: 3 [IU] via SUBCUTANEOUS

## 2019-08-31 NOTE — Discharge Instructions (Signed)

## 2019-08-31 NOTE — Progress Notes (Signed)
Patient remains confused and attempted to get out of bed. Reoriented patient, placed yellow fall risk socks, and confirmed that the bed alarm was turned on.

## 2019-08-31 NOTE — Plan of Care (Signed)
  Problem: Education: Goal: Knowledge of General Education information will improve Description: Including pain rating scale, medication(s)/side effects and non-pharmacologic comfort measures Outcome: Progressing   Problem: Health Behavior/Discharge Planning: Goal: Ability to manage health-related needs will improve Outcome: Progressing   Problem: Clinical Measurements: Goal: Ability to maintain clinical measurements within normal limits will improve Outcome: Progressing Goal: Will remain free from infection Outcome: Progressing Goal: Diagnostic test results will improve Outcome: Progressing Goal: Respiratory complications will improve Outcome: Progressing Goal: Cardiovascular complication will be avoided Outcome: Progressing   Problem: Activity: Goal: Risk for activity intolerance will decrease Outcome: Progressing   Problem: Nutrition: Goal: Adequate nutrition will be maintained Outcome: Progressing   Problem: Coping: Goal: Level of anxiety will decrease Outcome: Progressing   Problem: Elimination: Goal: Will not experience complications related to bowel motility Outcome: Progressing Goal: Will not experience complications related to urinary retention Outcome: Progressing   Problem: Pain Managment: Goal: General experience of comfort will improve Outcome: Progressing   Problem: Skin Integrity: Goal: Risk for impaired skin integrity will decrease Outcome: Progressing   Problem: Education: Goal: Ability to demonstrate management of disease process will improve Outcome: Progressing Goal: Ability to verbalize understanding of medication therapies will improve Outcome: Progressing Goal: Individualized Educational Video(s) Outcome: Progressing

## 2019-08-31 NOTE — Progress Notes (Signed)
Patient is alert and oriented to self and location. She remains confused and will attempt to get out up bed. Has reoriented patient numerous times throughout the day; she verbalizes she understands and. Yellow fall risk socks are worn, bed alarm is on.

## 2019-08-31 NOTE — Care Management (Signed)
Per  Harlow Mares with the pharmacy help desk.  Co-pay amount for Eliquis 30 day supply twice a day.$47.00.  No PA required Deductible has not been met. $50.00 Tier 3 medication Retail Pharmacy: [facility].  Co-pay amount for Eliquis 90 day supply twice a day $131.00. Thru [facility].  PA required:@ [phone] (for the 90 day supply) Tier 3 medication

## 2019-08-31 NOTE — Progress Notes (Signed)
  Echocardiogram 2D Echocardiogram has been performed.  Stephanie [name] 08/31/2019, 11:04 AM

## 2019-08-31 NOTE — Care Management (Signed)
1157 08-31-19 Benefits check submitted for Eliquis. Case Manager will make patient aware of cost. Graves-Bigelow, Ocie Cornfield, RN,BSN Case Manager [phone]

## 2019-08-31 NOTE — Consult Note (Signed)
[facility] Medical Consultation  Stephanie [name] [id number] DOB: February 29, 1934 DOA: 08/17/2019 PCP: Abner Greenspan, MD   Requesting physician:  Alfonzo Beers, MD Date of consultation: 08/31/2019 Reason for consultation: Encephalopathy  Impression/Recommendations Active Problems:   Acute on chronic combined systolic (congestive) and diastolic (congestive) heart failure ([place])    1. Atrial fibrillation/supraventricular tachycardia: Initially seen on admission.  Cardiology restarted metoprolol 50 mg twice daily and plan on patient being started on Eliquis. -Per cardiology  2. Acute on chronic combined systolic and diastolic heart failure: EF noted to be around 45 to 50%.  She was started on Lasix.  -Per cardiology  3. Acute respiratory syndrome/suspected community-acquired pneumonia: Patient had elevated D-dimer and had CT angiogram of the chest performed which noted signs of multifocal pneumonia.  -Check procalcitonin -Consider start starting Rocephin and azithromycin  4. Acute encephalopathy, hallucinations: Patient noted to be more altered and confused than normal.  It is not totally clear patient has some baseline dementia and is sundowning.  Ambien may have worsened confusion overnight.  Complaints of visual hallucinations over the last 3 weeks appears to be new.  Question the possibility of delirium with hallucination vs. Infection vs. stroke vs. Christianne Borrow syndrome due to visual disturbance. -Check MRI of the brain -Consult psychiatry for medication options for visual hallucinations and confusion -Patient needs to follow-up with ophthalmology in the outpatient setting  5. Diabetes mellitus type 2: Blood sugars elevated into the 200s to 300s during evaluation today.   Last hemoglobin A1c noted to be 6.5 on 07/2019 she was restarted on glipizide in addition to a sensitive sliding scale insulin with meals.  -Change sliding scale to moderate SSI      I will followup again  tomorrow. Please contact me if I can be of assistance in the meanwhile. Thank you for this consultation.  Chief Complaint: Shortness of breath  HPI:  Stephanie [name] is a 86 y.o. female with medical history significant of hypertension, hyperlipidemia, CAD, PSVT, CVA in 02/2019, carotid artery disease, and DM type II who was admitted with shortness of breath.  She was found to have new onset A. fib with RVR with CHF exacerbation and admitted to the cardiology service.  Overnight patient was noted to be confused and only oriented to self.  The patient's husband who is present at bedside helps offer additional history.  He reports that she has been having progressively worsening confusion since Christmas.  She has been seeing people that have not been there for 3 weeks now.  Patient has not been sleeping at night, but then sleeps throughout the day.  She had previously been noted to have cataracts that need to be removed.  The ophthalmologist office had not followed back up with them after she was late from my previous visit.  Review of Systems  Unable to perform ROS: Mental status change  Psychiatric/Behavioral: Positive for hallucinations and memory loss.    Past Medical History:  Diagnosis Date  . Allergy    allergic rhinitis  . Anxiety   . Arthritis    osteoarthritis  . CAD (coronary artery disease)    a. s/p prior stenting of the LAD and D1 (02/2004); b. 02/2019 NSTEMI in setting of PSVT/Cath: >M 90m/d, LAD 40ost/p/m, D1 60, LCX 69m, 50d, RCA  nl-->Med Rx.  . Carotid arterial disease ([place])    a. 02/2019 Carotid U/S: RICA [id number], LICA [id number]. Antegrade bilat vert flow.  . Degenerative disk disease    in neck  .  Diabetes mellitus    type II  . Fracture of femoral neck, left ([place]) 01/21/2018  . Hyperlipidemia   . Hypertension   . Hypoaldosteronism ([place]) 6/12   from DM causing hyperkalemia   . Hyponatremia    a. 02/2019 in setting of chlorthalidone rx (d/c'd).  . Ischemic cardiomyopathy    a.  11/2016 Echo: EF 55-60%, Gr1 DD; b. 02/2019 Echo: EF 45-50%, sev basal-mid infsept, inf, inflat HK. DD. Sev dil LA.  . Osteopenia   . Periodontal disease   . Peripheral vascular disease ([place])   . PSVT (paroxysmal supraventricular tachycardia) ([place])    a. 02/2019 - in absence of BB therapy-->broke w/ adenosine.  . Stroke [facility])    a. 02/2019 MRI: L temporal infarct (1 day following cardiac cath).  . Transient cerebral ischemia 11/06/2016   Past Surgical History:  Procedure Laterality Date  . CARDIAC CATHETERIZATION     x2 STENT  . CHOLECYSTECTOMY    . ESOPHAGOGASTRODUODENOSCOPY  03/2004   gastritis by biopsy  . GUM SURGERY  06/2010   laser surgery for gums  . HIP ARTHROPLASTY Left 01/21/2018   Procedure: ARTHROPLASTY BIPOLAR HIP (HEMIARTHROPLASTY);  Surgeon: Marchia Bond, MD;  Location: [facility];  Service: Orthopedics;  Laterality: Left;  . IR RADIOLOGIST EVAL & MGMT  06/17/2018  . LEFT HEART CATH AND CORONARY ANGIOGRAPHY N/A 02/16/2019   Procedure: LEFT HEART CATH AND CORONARY ANGIOGRAPHY;  Surgeon: Wellington Hampshire, MD;  Location: [facility];  Service: Cardiovascular;  Laterality: N/A;  . MM BREAST STEREO BX*L*R/S  12/12   normal  . TONSILLECTOMY    . TRANSTHORACIC ECHOCARDIOGRAM  11/2016   EF 55-60%, grade 1 DD with elevated LV end diastolic filling pressures, mildly dilated LA, mild TR and PR.   . TUBAL LIGATION     Social History:  reports that she has never smoked. She has never used smokeless tobacco. She reports that she does not drink alcohol or use drugs.  Allergies  Allergen Reactions  . Ace Inhibitors Other (See Comments)    Chest pain  . Amlodipine Besy-Benazepril Hcl Other (See Comments)    Chest pain  . Clopidogrel Bisulfate Other (See Comments)    GI upset  . Erythromycin Other (See Comments)    GI upset  . Pneumovax 23 [Pneumococcal Vac Polyvalent]     Local reaction   . Tape Other (See Comments)    Causes soreness (of the skin)   Family History  Problem  Relation Age of Onset  . Cancer Mother        vaginal CA in situ  . Hypertension Mother   . Heart disease Mother        CAD and MI  . Heart disease Father        CAD  . Diabetes Father   . Heart disease Son        congenital valve problem    Prior to Admission medications   Medication Sig Start Date End Date Taking? Authorizing Provider  atorvastatin (LIPITOR) 20 MG tablet Take 1 tablet (20 mg total) by mouth daily. 07/22/19  Yes Tower, Wynelle Fanny, MD  dipyridamole-aspirin (AGGRENOX) 200-25 MG 12hr capsule Take 1 capsule by mouth twice daily Patient taking differently: Take 1 capsule by mouth 2 (two) times daily.  09/24/18  Yes Tower, Wynelle Fanny, MD  famotidine (PEPCID) 40 MG tablet Take 1 tablet (40 mg total) by mouth daily. Patient taking differently: Take 40 mg by mouth See admin instructions. Take 40  mg by mouth one to two times a day 04/28/18  Yes Thornton Park, MD  glipiZIDE (GLUCOTROL XL) 10 MG 24 hr tablet Take 1 tablet by mouth once daily with breakfast Patient taking differently: Take 10 mg by mouth daily with breakfast.  03/16/19  Yes Tower, Wynelle Fanny, MD  metFORMIN (GLUCOPHAGE) 1000 MG tablet TAKE 1 TABLET BY MOUTH TWICE DAILY WITH MEALS Patient taking differently: Take 1,000 mg by mouth 2 (two) times daily with a meal.  08/18/19  Yes Tower, Marne A, MD  metoprolol succinate (TOPROL-XL) 100 MG 24 hr tablet TAKE 1 TABLET BY MOUTH ONCE DAILY TAKE  WITH  OR  IMMEDIATELY  FOLLOWING  A  MEAL Patient taking differently: Take 100 mg by mouth daily. TAKE 1 TABLET BY MOUTH ONCE DAILY TAKE  WITH  OR  IMMEDIATELY  FOLLOWING  A  MEAL 05/20/19  Yes Tower, Wynelle Fanny, MD  nitroGLYCERIN (NITROSTAT) 0.4 MG SL tablet Place 1 tablet (0.4 mg total) under the tongue every 5 (five) minutes x 3 doses as needed for chest pain. 02/17/19  Yes Sande Rives E, PA-C  PARoxetine (PAXIL) 40 MG tablet Take 1 tablet (40 mg total) by mouth at bedtime. 03/26/19  Yes Tower, Wynelle Fanny, MD  VITAMIN D PO Take 1 tablet by mouth in  the morning, at noon, and at bedtime.   Yes [provider]  VITAMIN K PO Take 1 tablet by mouth daily.   Yes [provider]  cephALEXin (KEFLEX) 500 MG capsule Take 1 capsule (500 mg total) by mouth 2 (two) times daily. Patient not taking: Reported on 08/23/2019 07/23/19   Abner Greenspan, MD   Physical Exam:  Constitutional: Elderly female who appears to be in no acute distress at this time Vitals:   08/31/19 0425 08/31/19 0741 08/31/19 0855 08/31/19 1150  BP: (!) 130/93 (!) 148/94 (!) 144/97 134/76  Pulse: 93 88 87 87  Resp: 18 18 19 18   Temp: 98.6 F (37 C) 98 F (36.7 C)  98 F (36.7 C)  TempSrc:      SpO2: 93% 98% 95% 100%  Weight:      Height:       Eyes: PERRL, lids and conjunctivae normal ENMT: Mucous membranes are moist.  Posterior pharynx clear of any exudate or lesions.  Neck: normal, supple, no masses, no thyromegaly Respiratory: Normal expiratory effort with some rhonchi noted in the mid lung fields and rales at the bases. Cardiovascular: Irregular irregular, no murmurs / rubs / gallops. No extremity edema. 2+ pedal pulses. No carotid bruits.  Abdomen: no tenderness, no masses palpated. No hepatosplenomegaly. Bowel sounds positive.  Musculoskeletal: no clubbing / cyanosis. No joint deformity upper and lower extremities. Good ROM, no contractures. Normal muscle tone.  Skin: no rashes, lesions, ulcers. No induration Neurologic: CN 2-12 grossly intact. Sensation intact, DTR normal. Strength 5/5 in all 4.  Psychiatric: Alert and oriented to self.  Patient has flat affect.  Labs on Admission:  Basic Metabolic Panel: Recent Labs  Lab 09/08/2019 1645 08/31/19 0535  NA 133* 134*  K 4.3 4.6  CL 102 101  CO2 20* 20*  GLUCOSE 244* 300*  BUN 14 18  CREATININE 1.02* 1.19*  CALCIUM 9.3 9.3   Liver Function Tests: Recent Labs  Lab 08/28/2019 1645  AST 22  ALT 13  ALKPHOS 79  BILITOT 0.7  PROT 6.1*  ALBUMIN 2.9*   No results for input(s): LIPASE,  AMYLASE in the last 168 hours. No results for  input(s): AMMONIA in the last 168 hours. CBC: Recent Labs  Lab 08/28/2019 1645  WBC 9.7  NEUTROABS 7.1  HGB 11.0*  HCT 35.4*  MCV 91.9  PLT 343   Cardiac Enzymes: No results for input(s): CKTOTAL, CKMB, CKMBINDEX, TROPONINI in the last 168 hours. BNP: Invalid input(s): POCBNP CBG: Recent Labs  Lab 08/16/2019 2126 08/31/19 0621 08/31/19 1128 08/31/19 1636  GLUCAP 213* 196* 236* 206*    Radiological Exams on Admission: CT ANGIO CHEST PE W OR WO CONTRAST  Result Date: 08/31/2019 CLINICAL DATA:  Shortness of breath and elevated D-dimer EXAM: CT ANGIOGRAPHY CHEST WITH CONTRAST TECHNIQUE: Multidetector CT imaging of the chest was performed using the standard protocol during bolus administration of intravenous contrast. Multiplanar CT image reconstructions and MIPs were obtained to evaluate the vascular anatomy. CONTRAST:  11mL OMNIPAQUE IOHEXOL 350 MG/ML SOLN COMPARISON:  Chest x-ray from previous day. FINDINGS: Cardiovascular: Atherosclerotic calcifications thoracic aorta noted. No aneurysmal dilatation is seen. Opacification is decreased limiting evaluation for dissection. The pulmonary artery shows a normal branching pattern. No filling defect to suggest pulmonary embolus is noted. Coronary calcifications are noted. Cardiac enlargement is noted. Mediastinum/Nodes: Thoracic inlet is within normal limits. No hilar or mediastinal adenopathy is noted. The esophagus as visualized is within normal limits. Lungs/Pleura: Large bilateral pleural effusions are noted. Patchy multifocal infiltrates are noted within the upper lobes bilaterally as well as lower lobe consolidation. These changes are consistent with multifocal pneumonia. Upper Abdomen: Visualized upper abdomen demonstrates heavily calcified splenic artery aneurysm measuring approximately 2.1 cm. This is roughly stable when compared with prior CTs of the abdomen. Musculoskeletal: Degenerative  changes of the thoracic spine are noted. Review of the MIP images confirms the above findings. IMPRESSION: Large bilateral pleural effusions with associated lower lobe consolidation. Multifocal infiltrates in the upper lobes are noted bilaterally. These changes are likely infectious in etiology. No evidence of pulmonary emboli. Stable calcified splenic artery aneurysm. Electronically Signed   By: Inez Catalina M.D.   On: 08/31/2019 14:20   DG Chest Portable 1 View  Result Date: 09/02/2019 CLINICAL DATA:  Shortness of breath and chest pain EXAM: PORTABLE CHEST 1 VIEW COMPARISON:  02/15/2019 FINDINGS: Cardiac shadow is stable. Aortic calcifications are again seen. Diffuse vascular congestion is noted with interstitial edema. Small effusions are noted as well. Likely underlying atelectasis is seen as well. No bony abnormality is noted. IMPRESSION: Changes consistent with CHF. Bibasilar atelectatic changes are also noted. Electronically Signed   By: Inez Catalina M.D.   On: 09/02/2019 16:46   ECHOCARDIOGRAM COMPLETE  Result Date: 08/31/2019    ECHOCARDIOGRAM REPORT   Patient Name:   SHRENA BELMAREZ Date of Exam: 08/31/2019 Medical Rec #:  [id number]      Height:       60.0 in Accession #:    [id number]     Weight:       106.9 lb Date of Birth:  1934/05/08      BSA:          1.43 m Patient Age:    86 years       BP:           144/97 mmHg Patient Gender: F              HR:           87 bpm. Exam Location:  Inpatient Procedure: 2D Echo, Cardiac Doppler and Color Doppler Indications:    Chest pain  History:  Patient has prior history of Echocardiogram examinations, most                 recent 02/16/2019. CHF, CAD, Stroke, Arrythmias:Atrial                 Fibrillation, Signs/Symptoms:Shortness of Breath and Altered                 Mental Status; Risk Factors:Hypertension, Dyslipidemia and                 Diabetes.  Sonographer:    Dustin Flock Referring Phys: [phone] HAO MENG IMPRESSIONS  1. Left ventricular  ejection fraction, by estimation, is <20%. The left ventricle has severely decreased function. The left ventricle demonstrates global hypokinesis. Left ventricular diastolic parameters are consistent with Grade III diastolic dysfunction (restrictive).  2. Right ventricular systolic function is normal. The right ventricular size is normal. There is moderately elevated pulmonary artery systolic pressure.  3. Left atrial size was severely dilated.  4. The mitral valve is normal in structure and function. Mild mitral valve regurgitation. No evidence of mitral stenosis.  5. Tricuspid valve regurgitation is moderate.  6. The aortic valve is normal in structure and function. Aortic valve regurgitation is not visualized. Mild to moderate aortic valve sclerosis/calcification is present, without any evidence of aortic stenosis.  7. The inferior vena cava is dilated in size with >50% respiratory variability, suggesting right atrial pressure of 8 mmHg. Comparison(s): The left ventricular function is worsened. FINDINGS  Left Ventricle: Left ventricular ejection fraction, by estimation, is <20%. The left ventricle has severely decreased function. The left ventricle demonstrates global hypokinesis. The left ventricular internal cavity size was normal in size. There is no  left ventricular hypertrophy. Left ventricular diastolic parameters are consistent with Grade III diastolic dysfunction (restrictive). Right Ventricle: The right ventricular size is normal. No increase in right ventricular wall thickness. Right ventricular systolic function is normal. There is moderately elevated pulmonary artery systolic pressure. The tricuspid regurgitant velocity is 3.37 m/s, and with an assumed right atrial pressure of 8 mmHg, the estimated right ventricular systolic pressure is [id number] mmHg. Left Atrium: Left atrial size was severely dilated. Right Atrium: Right atrial size was normal in size. Pericardium: There is no evidence of pericardial  effusion. Mitral Valve: The mitral valve is normal in structure and function. Normal mobility of the mitral valve leaflets. Mild mitral valve regurgitation. No evidence of mitral valve stenosis. Tricuspid Valve: The tricuspid valve is normal in structure. Tricuspid valve regurgitation is moderate . No evidence of tricuspid stenosis. Aortic Valve: The aortic valve is normal in structure and function.. There is moderate thickening and moderate calcification of the aortic valve. Aortic valve regurgitation is not visualized. Mild to moderate aortic valve sclerosis/calcification is present, without any evidence of aortic stenosis. There is moderate thickening of the aortic valve. There is moderate calcification of the aortic valve. Pulmonic Valve: The pulmonic valve was normal in structure. Pulmonic valve regurgitation is mild to moderate. No evidence of pulmonic stenosis. Aorta: The aortic root is normal in size and structure. Venous: The inferior vena cava is dilated in size with greater than 50% respiratory variability, suggesting right atrial pressure of 8 mmHg. IAS/Shunts: No atrial level shunt detected by color flow Doppler.  LEFT VENTRICLE PLAX 2D LVIDd:         4.31 cm  Diastology LVIDs:         3.91 cm  LV e' lateral:   6.31 cm/s LV PW:  0.87 cm  LV E/e' lateral: 19.3 LV IVS:        0.87 cm  LV e' medial:    4.35 cm/s LVOT diam:     1.70 cm  LV E/e' medial:  28.0 LV SV:         31.78 ml LV SV Index:   11.99 LVOT Area:     2.27 cm  RIGHT VENTRICLE RV Basal diam:  2.33 cm RV S prime:     6.31 cm/s TAPSE (M-mode): 1.7 cm LEFT ATRIUM             Index       RIGHT ATRIUM           Index LA diam:        4.70 cm 3.29 cm/m  RA Area:     16.80 cm LA Vol (A2C):   93.0 ml 65.02 ml/m RA Volume:   48.60 ml  33.98 ml/m LA Vol (A4C):   80.7 ml 56.42 ml/m LA Biplane Vol: 91.4 ml 63.90 ml/m  AORTIC VALVE LVOT Vmax:   74.60 cm/s LVOT Vmean:  47.800 cm/s LVOT VTI:    0.140 m  AORTA Ao Root diam: 2.80 cm MITRAL VALVE                 TRICUSPID VALVE MV Area (PHT): 4.31 cm     TR Peak grad:   45.4 mmHg MV Decel Time: 176 msec     TR Vmax:        337.00 cm/s MV E velocity: 122.00 cm/s MV A velocity: 24.50 cm/s   SHUNTS MV E/A ratio:  4.98         Systemic VTI:  0.14 m                             Systemic Diam: 1.70 cm Candee Furbish MD Electronically signed by Candee Furbish MD Signature Date/Time: 08/31/2019/1:27:42 PM    Final      Time spent: >45 minutes  Norval Morton [facility] Pager [phone]  If 7PM-7AM, please contact night-coverage [email or web address] Password [facility] 08/31/2019, 6:44 PM

## 2019-08-31 NOTE — Progress Notes (Addendum)
Progress Note  Patient Name: Stephanie [name] Date of Encounter: 08/31/2019  Primary Cardiologist: Ida Rogue, MD   Subjective   Per nurse report: patient is confused after she received ambien at mid night.  A& O to name and time but not to place. Reports improved breathing.   Inpatient Medications    Scheduled Meds: . apixaban  2.5 mg Oral BID  . atorvastatin  20 mg Oral q1800  . famotidine  40 mg Oral Daily  . furosemide  40 mg Intravenous Daily  . glipiZIDE  10 mg Oral Q breakfast  . insulin aspart  0-9 Units Subcutaneous TID WC  . metFORMIN  1,000 mg Oral BID WC  . metoprolol succinate  50 mg Oral BID  . PARoxetine  40 mg Oral QHS   Continuous Infusions:  PRN Meds: acetaminophen, nitroGLYCERIN, ondansetron (ZOFRAN) IV   Vital Signs    Vitals:   08/31/19 0318 08/31/19 0425 08/31/19 0741 08/31/19 0855  BP:  (!) 130/93 (!) 148/94 (!) 144/97  Pulse:  93 88 87  Resp:  18 18 19   Temp:  98.6 F (37 C) 98 F (36.7 C)   TempSrc:      SpO2:  93% 98% 95%  Weight: 48.5 kg     Height:        Intake/Output Summary (Last 24 hours) at 08/31/2019 0927 Last data filed at 08/31/2019 0615 Gross per 24 hour  Intake 240 ml  Output 625 ml  Net -385 ml   Last 3 Weights 08/31/2019 09/01/2019 07/20/2019  Weight (lbs) 106 lb 14.4 oz 107 lb 14.4 oz 104 lb 9 oz  Weight (kg) 48.49 kg 48.943 kg 47.429 kg      Telemetry    Atrial fibrillation at rate of 90-100s - Personally Reviewed  ECG    Afib, LVH and ST/T wave changes in lateral leads- Personally Reviewed  Physical Exam   GEN: thin frail ill appearing elderly confused female in no acute distress.   Neck: JVD Cardiac: Ir IR , no murmurs, rubs, or gallops.  Respiratory: Clear to auscultation bilaterally. GI: Soft, nontender, non-distended  MS: No edema; No deformity. Neuro:  Nonfocal, A & O to name, DOB and time but not to place Psych: confused but answers questions appropriately   Labs    High Sensitivity Troponin:    Recent Labs  Lab 08/27/2019 1645 08/28/2019 1830 08/29/2019 2049 08/24/2019 2240  TROPONINIHS 70* 74* 76* 81*      Chemistry Recent Labs  Lab 08/31/2019 1645 08/31/19 0535  NA 133* 134*  K 4.3 4.6  CL 102 101  CO2 20* 20*  GLUCOSE 244* 300*  BUN 14 18  CREATININE 1.02* 1.19*  CALCIUM 9.3 9.3  PROT 6.1*  --   ALBUMIN 2.9*  --   AST 22  --   ALT 13  --   ALKPHOS 79  --   BILITOT 0.7  --   GFRNONAA 51* 42*  GFRAA 59* 49*  ANIONGAP 11 13     Hematology Recent Labs  Lab 09/11/2019 1645  WBC 9.7  RBC 3.85*  HGB 11.0*  HCT 35.4*  MCV 91.9  MCH 28.6  MCHC 31.1  RDW 13.4  PLT 343    BNP Recent Labs  Lab 08/17/2019 1645  BNP 2,650.2*     DDimer  Recent Labs  Lab 09/09/2019 2049  DDIMER 1.67*     Radiology    DG Chest Portable 1 View  Result Date: 08/24/2019 CLINICAL DATA:  Shortness of breath and chest pain EXAM: PORTABLE CHEST 1 VIEW COMPARISON:  02/15/2019 FINDINGS: Cardiac shadow is stable. Aortic calcifications are again seen. Diffuse vascular congestion is noted with interstitial edema. Small effusions are noted as well. Likely underlying atelectasis is seen as well. No bony abnormality is noted. IMPRESSION: Changes consistent with CHF. Bibasilar atelectatic changes are also noted. Electronically Signed   By: Inez Catalina M.D.   On: 09/11/2019 16:46    Cardiac Studies   Pending echocardiogram  Patient Profile     86 y.o. female with hx of medically managed CAD, carotid artery disease, HTN, HLD, DM, remote stroke (has been on Aggrenox) and pSVT who presented for SOB and pleuritic chest pain. Noted in afib RVR and CHF exacerbation.   Got 1st dose of Pfizer COVID vaccine 08/28/19.   Assessment & Plan    1. New onset atrial fibrillation with RVR - Received IV metoprolol 2.5mg  x1. HR now improved to 90-100s.  - Continue Toprol XL 50mg  BID - CHADSVASCS score of at least 8 for age, sex, HTN, DM, CHF and hx of stroke. Started on Eliquis 2.5mg  BID for  anticoagulation  2. Acute combined CHF - BNP 2650. Chest Xray consistent with CHF. Net I & O -385. Refused purewick, filled hat. Weight down 1lb (107>>106lb).  - Continue IV diuresis and BB - Watch renal function  - Pending echo  3. Acute respiratory syndrome - COVID and influenza panel is negative. D-dimer 1.67. Breathing improving. WBC normal. Will review with MD for further plan. Pending echo.  4. Elevated troponin with hx of  Mild to moderate CAD by cath 02/2019 (50% left main, 40% ostial LAD, sequential 40% then 50% left circumflex stenosis) - Hs-Troponin 70>>74>>76>>81. EKG this morning showed St/t wave changes laterally.  CP improved this morning. Likely demand ischemia in setting of elevated HR and CHF exacerbation.   5. HLD - Continue statin  6. DM - SSI  7. Confusion - After got ambien overnight, will avoid. Afebrile. Respiratory panel negative. Will follow closely.   For questions or updates, please contact [place] Please consult [email or web address] for contact info under   SignedLeanor Kail, PA  08/31/2019, 9:27 AM     Attending Note:   The patient was seen and examined.  Agree with assessment and plan as noted above.  Changes made to the above note as needed.  Patient seen and independently examined with  Vin bhagat, PA.   We discussed all aspects of the encounter. I agree with the assessment and plan as stated above.  1.   Pleuretic CP:  She states again that she has pleurtic cp.  D-dimer is elevated    New onset AF.   Will get a ct angio of her chest to look for PE   2.  Atrial fib:   HR is better.  Cont meds.  Cont eliquis 2.5 bid  3.  Confusion:  Has worsened overnight.  Will ask [facility] to assist [place] with this - ? Infection, ? Due to [place] ?   4.  Acute CHF:   Echo today .  Cont lasix   A high level of decision making was required in the care of this patient.   I have spent a total of 40 minutes with patient reviewing hospital  notes ,  telemetry, EKGs, labs and examining patient as well as establishing an assessment and plan that was discussed with the patient. > 50% of time was spent in direct patient care.  Thayer Headings, Brooke Bonito., MD, [facility] 08/31/2019, 10:04 AM [address]  [place] Pager [phone]

## 2019-08-31 NOTE — Progress Notes (Signed)
Patient became confused and unsteady on her feet. Speech is coherent but slow and somewhat slurred. Alert and oriented but offered maiden name Stephanie [name]) instead of current name. Assessed for pronator drift and facial asymmetry, negative for stroke symptoms. MD notified

## 2019-08-31 NOTE — ED Provider Notes (Signed)
.  Critical Care Performed by: Wyvonnia Dusky, MD Authorized by: Wyvonnia Dusky, MD   Critical care provider statement:    Critical care time (minutes):  35   Critical care was necessary to treat or prevent imminent or life-threatening deterioration of the following conditions:  CNS failure or compromise   Critical care was time spent personally by me on the following activities:  Discussions with consultants, evaluation of patient's response to treatment, examination of patient, ordering and performing treatments and interventions, ordering and review of laboratory studies, ordering and review of radiographic studies, pulse oximetry, re-evaluation of patient's condition, obtaining history from patient or surrogate and review of old charts Comments:     A Fib with RVR requiring IV metoprolol for rate control      Wyvonnia Dusky, MD 08/31/19 1218

## 2019-09-01 ENCOUNTER — Inpatient Hospital Stay (HOSPITAL_COMMUNITY): Payer: Medicare Other

## 2019-09-01 DIAGNOSIS — Z66 Do not resuscitate: Secondary | ICD-10-CM

## 2019-09-01 DIAGNOSIS — G9340 Encephalopathy, unspecified: Secondary | ICD-10-CM

## 2019-09-01 DIAGNOSIS — Z7189 Other specified counseling: Secondary | ICD-10-CM

## 2019-09-01 DIAGNOSIS — Z9189 Other specified personal risk factors, not elsewhere classified: Secondary | ICD-10-CM

## 2019-09-01 DIAGNOSIS — Z515 Encounter for palliative care: Secondary | ICD-10-CM

## 2019-09-01 LAB — GLUCOSE, CAPILLARY
Glucose-Capillary: 49 mg/dL — ABNORMAL LOW (ref 70–99)
Glucose-Capillary: 121 mg/dL — ABNORMAL HIGH (ref 70–99)
Glucose-Capillary: 68 mg/dL — ABNORMAL LOW (ref 70–99)
Glucose-Capillary: 69 mg/dL — ABNORMAL LOW (ref 70–99)
Glucose-Capillary: 157 mg/dL — ABNORMAL HIGH (ref 70–99)
Glucose-Capillary: 120 mg/dL — ABNORMAL HIGH (ref 70–99)

## 2019-09-01 LAB — LACTIC ACID, PLASMA
Lactic Acid, Venous: 8.1 mmol/L (ref 0.5–1.9)
Lactic Acid, Venous: 6 mmol/L (ref 0.5–1.9)

## 2019-09-01 MED ORDER — HYDROMORPHONE HCL 1 MG/ML IJ SOLN: 0.5000 mg | INTRAMUSCULAR | Status: DC | PRN

## 2019-09-01 MED ORDER — SODIUM CHLORIDE 0.9 % IV SOLN
0.5000 mg/h | INTRAVENOUS | Status: DC
  Administered 2019-09-01 – 2019-09-03 (×2): 0.5 mg/h via INTRAVENOUS
  Filled 2019-09-01 (×2): qty 2.5

## 2019-09-01 MED ORDER — LORAZEPAM 2 MG/ML IJ SOLN
0.5000 mg | INTRAMUSCULAR | Status: DC | PRN
  Administered 2019-09-01 (×2): 1 mg via INTRAVENOUS
  Filled 2019-09-01 (×2): qty 1

## 2019-09-01 MED ORDER — AZITHROMYCIN 250 MG PO TABS: 250.0000 mg | ORAL_TABLET | Freq: Every day | ORAL | Status: DC

## 2019-09-01 MED ORDER — BISACODYL 10 MG RE SUPP: 10.0000 mg | Freq: Every day | RECTAL | Status: DC | PRN

## 2019-09-01 MED ORDER — HYDROMORPHONE BOLUS VIA INFUSION
0.5000 mg | INTRAVENOUS | Status: DC | PRN
  Filled 2019-09-01: qty 1

## 2019-09-01 MED ORDER — METOPROLOL SUCCINATE ER 25 MG PO TB24: 25.0000 mg | ORAL_TABLET | Freq: Two times a day (BID) | ORAL | Status: DC

## 2019-09-01 MED ORDER — GLYCOPYRROLATE 0.2 MG/ML IJ SOLN
0.4000 mg | Freq: Four times a day (QID) | INTRAMUSCULAR | Status: DC | PRN
  Administered 2019-09-03: 0.4 mg via INTRAVENOUS
  Filled 2019-09-01 (×3): qty 2

## 2019-09-01 MED ORDER — HYPROMELLOSE (GONIOSCOPIC) 2.5 % OP SOLN
1.0000 [drp] | Freq: Three times a day (TID) | OPHTHALMIC | Status: DC | PRN
  Filled 2019-09-01: qty 15

## 2019-09-01 MED ORDER — BIOTENE DRY MOUTH MT LIQD: 15.0000 mL | OROMUCOSAL | Status: DC | PRN

## 2019-09-01 MED ORDER — FUROSEMIDE 10 MG/ML IJ SOLN
40.0000 mg | Freq: Two times a day (BID) | INTRAMUSCULAR | Status: DC
  Administered 2019-09-01: 40 mg via INTRAVENOUS
  Filled 2019-09-01: qty 4

## 2019-09-01 MED ORDER — HALOPERIDOL LACTATE 5 MG/ML IJ SOLN
1.0000 mg | Freq: Four times a day (QID) | INTRAMUSCULAR | Status: DC | PRN
  Filled 2019-09-01: qty 1

## 2019-09-01 MED ORDER — AZITHROMYCIN 500 MG PO TABS
500.0000 mg | ORAL_TABLET | Freq: Every day | ORAL | Status: DC
  Filled 2019-09-01: qty 1

## 2019-09-01 MED ORDER — ACETAMINOPHEN 650 MG RE SUPP: 325.0000 mg | RECTAL | Status: DC | PRN

## 2019-09-01 MED ORDER — CEFTRIAXONE SODIUM 1 G IJ SOLR
1.0000 g | INTRAMUSCULAR | Status: DC
  Filled 2019-09-01: qty 10

## 2019-09-01 NOTE — Progress Notes (Signed)
MEDICAL CONSULT PROGRESS NOTE  NICOL USREY [id number] DOB: [birth year] DOA: 08/23/2019 PCP: Abner Greenspan, MD  HPI/Recap of past 24 hours:   Had hypoglycemic episode, blood glucoes 46 at 620 this am  Assessment/Plan: Active Problems:   Type 2 diabetes mellitus ([place])   Acute on chronic combined systolic (congestive) and diastolic (congestive) heart failure (HCC)   Acute encephalopathy   Hallucination, visual    Acute encephalopathy, hallucinations -Small acute infarct in the anterior left frontal white matter -neurology consulted, case discussed with neurology Dr Leonel Ramsay who will see this patient today -psychiatry consulted Addendum: patient 's care transitioned to full comfort care later today  NIDDM2 a1c 6.5 Blood glucose labile with hyperglycemia (300) and hypoglycemia (46) D/c glipizide, hold metformin Start ssi in house Addendum: patient 's care transitioned to full comfort care later today    DVT Prophylaxis: now on comfort measures  Code Status: DNR   Disposition Plan: per cardiology   Consultants:  Neurology  Palliative care     Objective: BP (!) 131/58 (BP Location: Right Arm)   Pulse (!) 58   Temp (!) 97 F (36.1 C)   Resp 18   Ht 5' (1.524 m)   Wt 47.9 kg Comment: scale a  SpO2 100%   BMI 20.62 kg/m   Intake/Output Summary (Last 24 hours) at 09/01/2019 0812 Last data filed at 09/01/2019 0016 Gross per 24 hour  Intake 600 ml  Output 100 ml  Net 500 ml   Filed Weights   09/10/2019 2036 08/31/19 0318 09/01/19 0452  Weight: 48.9 kg 48.5 kg 47.9 kg    Exam: Patient is examined daily including today on 2/84/2021, exams remain the same as of yesterday except that has changed   Exam deferred due to patient 's care transitioned to full comfort care later today   Data Reviewed: Basic Metabolic Panel: Recent Labs  Lab 08/24/2019 1645 08/31/19 0535  NA 133* 134*  K 4.3 4.6  CL 102 101  CO2 20* 20*  GLUCOSE 244* 300*  BUN  14 18  CREATININE 1.02* 1.19*  CALCIUM 9.3 9.3   Liver Function Tests: Recent Labs  Lab 08/16/2019 1645  AST 22  ALT 13  ALKPHOS 79  BILITOT 0.7  PROT 6.1*  ALBUMIN 2.9*   No results for input(s): LIPASE, AMYLASE in the last 168 hours. No results for input(s): AMMONIA in the last 168 hours. CBC: Recent Labs  Lab 09/04/2019 1645  WBC 9.7  NEUTROABS 7.1  HGB 11.0*  HCT 35.4*  MCV 91.9  PLT 343   Cardiac Enzymes:   No results for input(s): CKTOTAL, CKMB, CKMBINDEX, TROPONINI in the last 168 hours. BNP (last 3 results) Recent Labs    08/18/2019 1645  BNP 2,650.2*    ProBNP (last 3 results) No results for input(s): PROBNP in the last 8760 hours.  CBG: Recent Labs  Lab 08/31/19 2008 08/31/19 2139 09/01/19 0619 09/01/19 0645 09/01/19 0724  GLUCAP 168* 132* 49* 69* 121*    Recent Results (from the past 240 hour(s))  Respiratory Panel by RT PCR (Flu A&B, Covid) - Nasopharyngeal Swab     Status: None   Collection Time: 08/25/2019  4:50 PM   Specimen: Nasopharyngeal Swab  Result Value Ref Range Status   SARS Coronavirus 2 by RT PCR NEGATIVE NEGATIVE Final    Comment: (NOTE) SARS-CoV-2 target nucleic acids are NOT DETECTED. The SARS-CoV-2 RNA is generally detectable in upper respiratoy specimens during the acute phase of infection. The lowest  concentration of SARS-CoV-2 viral copies this assay can detect is 131 copies/mL. A negative result does not preclude SARS-Cov-2 infection and should not be used as the sole basis for treatment or other patient management decisions. A negative result may occur with  improper specimen collection/handling, submission of specimen other than nasopharyngeal swab, presence of viral mutation(s) within the areas targeted by this assay, and inadequate number of viral copies (<131 copies/mL). A negative result must be combined with clinical observations, patient history, and epidemiological information. The expected result is  Negative. Fact Sheet for Patients:  PinkCheek.be Fact Sheet for Healthcare Providers:  GravelBags.it This test is not yet ap proved or cleared by the [place] FDA and  has been authorized for detection and/or diagnosis of SARS-CoV-2 by FDA under an Emergency Use Authorization (EUA). This EUA will remain  in effect (meaning this test can be used) for the duration of the COVID-19 declaration under Section 564(b)(1) of the Act, 21 U.S.C. section 360bbb-3(b)(1), unless the authorization is terminated or revoked sooner.    Influenza A by PCR NEGATIVE NEGATIVE Final   Influenza B by PCR NEGATIVE NEGATIVE Final    Comment: (NOTE) The Xpert Xpress SARS-CoV-2/FLU/RSV assay is intended as an aid in  the diagnosis of influenza from Nasopharyngeal swab specimens and  should not be used as a sole basis for treatment. Nasal washings and  aspirates are unacceptable for Xpert Xpress SARS-CoV-2/FLU/RSV  testing. Fact Sheet for Patients: PinkCheek.be Fact Sheet for Healthcare Providers: GravelBags.it This test is not yet approved or cleared by the [place] FDA and  has been authorized for detection and/or diagnosis of SARS-CoV-2 by  FDA under an Emergency Use Authorization (EUA). This EUA will remain  in effect (meaning this test can be used) for the duration of the  Covid-19 declaration under Section 564(b)(1) of the Act, 21  U.S.C. section 360bbb-3(b)(1), unless the authorization is  terminated or revoked. Performed at [facility], [place] [address], [place], [place] [address]      Studies: CT ANGIO CHEST PE W OR WO CONTRAST  Result Date: 08/31/2019 CLINICAL DATA:  Shortness of breath and elevated D-dimer EXAM: CT ANGIOGRAPHY CHEST WITH CONTRAST TECHNIQUE: Multidetector CT imaging of the chest was performed using the standard protocol during bolus administration of  intravenous contrast. Multiplanar CT image reconstructions and MIPs were obtained to evaluate the vascular anatomy. CONTRAST:  42mL OMNIPAQUE IOHEXOL 350 MG/ML SOLN COMPARISON:  Chest x-ray from previous day. FINDINGS: Cardiovascular: Atherosclerotic calcifications thoracic aorta noted. No aneurysmal dilatation is seen. Opacification is decreased limiting evaluation for dissection. The pulmonary artery shows a normal branching pattern. No filling defect to suggest pulmonary embolus is noted. Coronary calcifications are noted. Cardiac enlargement is noted. Mediastinum/Nodes: Thoracic inlet is within normal limits. No hilar or mediastinal adenopathy is noted. The esophagus as visualized is within normal limits. Lungs/Pleura: Large bilateral pleural effusions are noted. Patchy multifocal infiltrates are noted within the upper lobes bilaterally as well as lower lobe consolidation. These changes are consistent with multifocal pneumonia. Upper Abdomen: Visualized upper abdomen demonstrates heavily calcified splenic artery aneurysm measuring approximately 2.1 cm. This is roughly stable when compared with prior CTs of the abdomen. Musculoskeletal: Degenerative changes of the thoracic spine are noted. Review of the MIP images confirms the above findings. IMPRESSION: Large bilateral pleural effusions with associated lower lobe consolidation. Multifocal infiltrates in the upper lobes are noted bilaterally. These changes are likely infectious in etiology. No evidence of pulmonary emboli. Stable calcified splenic artery aneurysm.  Electronically Signed   By: Inez Catalina M.D.   On: 08/31/2019 14:20   MR BRAIN WO CONTRAST  Result Date: 09/01/2019 CLINICAL DATA:  Encephalopathy EXAM: MRI HEAD WITHOUT CONTRAST TECHNIQUE: Multiplanar, multiecho pulse sequences of the brain and surrounding structures were obtained without intravenous contrast. COMPARISON:  Brain MRI 02/17/2019 FINDINGS: Significantly motion degraded study which  was aborted early. Only diffusion imaging was acquired (axial and coronal) and is positive for a linear infarct in the left anterior frontal white matter. No hydrocephalus or shift. IMPRESSION: 1. Truncated and motion degraded study, only diffusion imaging was acquired. 2. Small acute infarct in the anterior left frontal white matter. Electronically Signed   By: Monte Fantasia M.D.   On: 09/01/2019 06:32   ECHOCARDIOGRAM COMPLETE  Result Date: 08/31/2019    ECHOCARDIOGRAM REPORT   Patient Name:   ROSSE KNIPE Date of Exam: 08/31/2019 Medical Rec #:  [id number]      Height:       60.0 in Accession #:    [id number]     Weight:       106.9 lb Date of Birth:  06/11/1934      BSA:          1.43 m Patient Age:    33 years       BP:           144/97 mmHg Patient Gender: F              HR:           87 bpm. Exam Location:  Inpatient Procedure: 2D Echo, Cardiac Doppler and Color Doppler Indications:    Chest pain  History:        Patient has prior history of Echocardiogram examinations, most                 recent 02/16/2019. CHF, CAD, Stroke, Arrythmias:Atrial                 Fibrillation, Signs/Symptoms:Shortness of Breath and Altered                 Mental Status; Risk Factors:Hypertension, Dyslipidemia and                 Diabetes.  Sonographer:    Dustin Flock Referring Phys: [phone] HAO MENG IMPRESSIONS  1. Left ventricular ejection fraction, by estimation, is <20%. The left ventricle has severely decreased function. The left ventricle demonstrates global hypokinesis. Left ventricular diastolic parameters are consistent with Grade III diastolic dysfunction (restrictive).  2. Right ventricular systolic function is normal. The right ventricular size is normal. There is moderately elevated pulmonary artery systolic pressure.  3. Left atrial size was severely dilated.  4. The mitral valve is normal in structure and function. Mild mitral valve regurgitation. No evidence of mitral stenosis.  5. Tricuspid valve  regurgitation is moderate.  6. The aortic valve is normal in structure and function. Aortic valve regurgitation is not visualized. Mild to moderate aortic valve sclerosis/calcification is present, without any evidence of aortic stenosis.  7. The inferior vena cava is dilated in size with >50% respiratory variability, suggesting right atrial pressure of 8 mmHg. Comparison(s): The left ventricular function is worsened. FINDINGS  Left Ventricle: Left ventricular ejection fraction, by estimation, is <20%. The left ventricle has severely decreased function. The left ventricle demonstrates global hypokinesis. The left ventricular internal cavity size was normal in size. There is no  left ventricular hypertrophy. Left ventricular diastolic parameters are consistent with  Grade III diastolic dysfunction (restrictive). Right Ventricle: The right ventricular size is normal. No increase in right ventricular wall thickness. Right ventricular systolic function is normal. There is moderately elevated pulmonary artery systolic pressure. The tricuspid regurgitant velocity is 3.37 m/s, and with an assumed right atrial pressure of 8 mmHg, the estimated right ventricular systolic pressure is [id number] mmHg. Left Atrium: Left atrial size was severely dilated. Right Atrium: Right atrial size was normal in size. Pericardium: There is no evidence of pericardial effusion. Mitral Valve: The mitral valve is normal in structure and function. Normal mobility of the mitral valve leaflets. Mild mitral valve regurgitation. No evidence of mitral valve stenosis. Tricuspid Valve: The tricuspid valve is normal in structure. Tricuspid valve regurgitation is moderate . No evidence of tricuspid stenosis. Aortic Valve: The aortic valve is normal in structure and function.. There is moderate thickening and moderate calcification of the aortic valve. Aortic valve regurgitation is not visualized. Mild to moderate aortic valve sclerosis/calcification is present,  without any evidence of aortic stenosis. There is moderate thickening of the aortic valve. There is moderate calcification of the aortic valve. Pulmonic Valve: The pulmonic valve was normal in structure. Pulmonic valve regurgitation is mild to moderate. No evidence of pulmonic stenosis. Aorta: The aortic root is normal in size and structure. Venous: The inferior vena cava is dilated in size with greater than 50% respiratory variability, suggesting right atrial pressure of 8 mmHg. IAS/Shunts: No atrial level shunt detected by color flow Doppler.  LEFT VENTRICLE PLAX 2D LVIDd:         4.31 cm  Diastology LVIDs:         3.91 cm  LV e' lateral:   6.31 cm/s LV PW:         0.87 cm  LV E/e' lateral: 19.3 LV IVS:        0.87 cm  LV e' medial:    4.35 cm/s LVOT diam:     1.70 cm  LV E/e' medial:  28.0 LV SV:         31.78 ml LV SV Index:   11.99 LVOT Area:     2.27 cm  RIGHT VENTRICLE RV Basal diam:  2.33 cm RV S prime:     6.31 cm/s TAPSE (M-mode): 1.7 cm LEFT ATRIUM             Index       RIGHT ATRIUM           Index LA diam:        4.70 cm 3.29 cm/m  RA Area:     16.80 cm LA Vol (A2C):   93.0 ml 65.02 ml/m RA Volume:   48.60 ml  33.98 ml/m LA Vol (A4C):   80.7 ml 56.42 ml/m LA Biplane Vol: 91.4 ml 63.90 ml/m  AORTIC VALVE LVOT Vmax:   74.60 cm/s LVOT Vmean:  47.800 cm/s LVOT VTI:    0.140 m  AORTA Ao Root diam: 2.80 cm MITRAL VALVE                TRICUSPID VALVE MV Area (PHT): 4.31 cm     TR Peak grad:   45.4 mmHg MV Decel Time: 176 msec     TR Vmax:        337.00 cm/s MV E velocity: 122.00 cm/s MV A velocity: 24.50 cm/s   SHUNTS MV E/A ratio:  4.98         Systemic VTI:  0.14 m  Systemic Diam: 1.70 cm Candee Furbish MD Electronically signed by Candee Furbish MD Signature Date/Time: 08/31/2019/1:27:42 PM    Final     Scheduled Meds: . alum & mag hydroxide-simeth  30 mL Oral Once  . apixaban  2.5 mg Oral BID  . atorvastatin  20 mg Oral q1800  . famotidine  20 mg Oral Daily  . furosemide   40 mg Intravenous BID  . insulin aspart  0-15 Units Subcutaneous TID WC  . metFORMIN  1,000 mg Oral BID WC  . metoprolol succinate  50 mg Oral BID  . PARoxetine  40 mg Oral QHS    Continuous Infusions:   Time spent: 32mins Case discussed with cardiology, neurology and palliative care  Florencia Reasons MD, PhD, FACP  [facility]  Available via Epic secure chat 7am-7pm for nonurgent issues Please page for urgent issues, pager number available through [email or web address] .   09/01/2019, 8:12 AM  LOS: 2 days

## 2019-09-01 NOTE — Progress Notes (Signed)
To the best of my knowledge, the student's charting is accurate.  

## 2019-09-01 NOTE — Progress Notes (Signed)
Patient's family is at the bedside. Patient's family has concerns in regards to code status and plan of care. MD is at the bedside.

## 2019-09-01 NOTE — Consult Note (Signed)
Telepsych Consultation   Reason for Consult:  "Elderly female with a progressive decline since Christmas. Husband reports 3 weeks of visual hallucinations with progressively worsening bouts of confusion. No previous documented history of dementia." Referring Physician:  Dr. Acie Fredrickson Location of [facility]: [facility] [phone] Location of Provider: [facility]  Patient Identification: Stephanie [name] MRN:  [id number] Principal Diagnosis: <principal problem not specified> Diagnosis:  Active Problems:   Type 2 diabetes mellitus ([place])   Acute on chronic combined systolic (congestive) and diastolic (congestive) heart failure (HCC)   Acute encephalopathy   Hallucination, visual   Total Time spent with patient: 30 minutes  Subjective:   Stephanie [name] is a 86 y.o. female patient admitted with encephalopathy.  Assessment completed by nurse practitioner.  Patient appears sedated, patient appears unable to meaningfully participate in assessment at this time.  Patient alert to self only.  Patient appears with eyes closed, speech slurred at this time.  HPI: Patient admitted with encephalopathy. Past Psychiatric History: Anxiety, depression  Risk to Self:  Unable to assess Risk to Others:  Unable to assess Prior Inpatient Therapy:  Unknown Prior Outpatient Therapy:  Unknown  Past Medical History:  Past Medical History:  Diagnosis Date  . Allergy    allergic rhinitis  . Anxiety   . Arthritis    osteoarthritis  . CAD (coronary artery disease)    a. s/p prior stenting of the LAD and D1 (02/2004); b. 02/2019 NSTEMI in setting of PSVT/Cath: >M 51m/d, LAD 40ost/p/m, D1 60, LCX 51m, 50d, RCA  nl-->Med Rx.  . Carotid arterial disease ([place])    a. 02/2019 Carotid U/S: RICA 123XX123, LICA [id number]. Antegrade bilat vert flow.  . Degenerative disk disease    in neck  . Diabetes mellitus    type II  . Fracture of femoral neck, left ([place]) 01/21/2018  . Hyperlipidemia   . Hypertension   .  Hypoaldosteronism ([place]) 6/12   from DM causing hyperkalemia   . Hyponatremia    a. 02/2019 in setting of chlorthalidone rx (d/c'd).  . Ischemic cardiomyopathy    a. 11/2016 Echo: EF 55-60%, Gr1 DD; b. 02/2019 Echo: EF 45-50%, sev basal-mid infsept, inf, inflat HK. DD. Sev dil LA.  . Osteopenia   . Periodontal disease   . Peripheral vascular disease ([place])   . PSVT (paroxysmal supraventricular tachycardia) ([place])    a. 02/2019 - in absence of BB therapy-->broke w/ adenosine.  . Stroke [facility])    a. 02/2019 MRI: L temporal infarct (1 day following cardiac cath).  . Transient cerebral ischemia 11/06/2016    Past Surgical History:  Procedure Laterality Date  . CARDIAC CATHETERIZATION     x2 STENT  . CHOLECYSTECTOMY    . ESOPHAGOGASTRODUODENOSCOPY  03/2004   gastritis by biopsy  . GUM SURGERY  06/2010   laser surgery for gums  . HIP ARTHROPLASTY Left 01/21/2018   Procedure: ARTHROPLASTY BIPOLAR HIP (HEMIARTHROPLASTY);  Surgeon: Marchia Bond, MD;  Location: [place];  Service: Orthopedics;  Laterality: Left;  . IR RADIOLOGIST EVAL & MGMT  06/17/2018  . LEFT HEART CATH AND CORONARY ANGIOGRAPHY N/A 02/16/2019   Procedure: LEFT HEART CATH AND CORONARY ANGIOGRAPHY;  Surgeon: Wellington Hampshire, MD;  Location: [facility];  Service: Cardiovascular;  Laterality: N/A;  . MM BREAST STEREO BX*L*R/S  12/12   normal  . TONSILLECTOMY    . TRANSTHORACIC ECHOCARDIOGRAM  11/2016   EF 55-60%, grade 1 DD with elevated LV end diastolic filling pressures, mildly dilated  LA, mild TR and PR.   . TUBAL LIGATION     Family History:  Family History  Problem Relation Age of Onset  . Cancer Mother        vaginal CA in situ  . Hypertension Mother   . Heart disease Mother        CAD and MI  . Heart disease Father        CAD  . Diabetes Father   . Heart disease Son        congenital valve problem   Family Psychiatric  History: Unknown Social History:  Social History   Substance and Sexual Activity   Alcohol Use No  . Alcohol/week: 0.0 standard drinks     Social History   Substance and Sexual Activity  Drug Use No    Social History   Socioeconomic History  . Marital status: Married    Spouse name: Luvenia Heller  . Number of children: 3  . Years of education: Not on file  . Highest education level: Not on file  Occupational History  . Occupation: retired   Tobacco Use  . Smoking status: Never Smoker  . Smokeless tobacco: Never Used  Substance and Sexual Activity  . Alcohol use: No    Alcohol/week: 0.0 standard drinks  . Drug use: No  . Sexual activity: Not on file  Other Topics Concern  . Not on file  Social History Narrative   [name] is a 86 year old retired female living at home with her husband, Luvenia Heller and her dog. She voices being independent with ADLs and independent/assist with iADLs She has her husband to assist with transportation to medical appointments      Social Determinants of Health   Financial Resource Strain: Low Risk   . Difficulty of Paying Living Expenses: Not hard at all  Food Insecurity: No Food Insecurity  . Worried About Charity fundraiser in the Last Year: Never true  . Ran Out of Food in the Last Year: Never true  Transportation Needs: No Transportation Needs  . Lack of Transportation (Medical): No  . Lack of Transportation (Non-Medical): No  Physical Activity: Inactive  . Days of Exercise per Week: 0 days  . Minutes of Exercise per Session: 0 min  Stress: No Stress Concern Present  . Feeling of Stress : Not at all  Social Connections: Not Isolated  . Frequency of Communication with Friends and Family: More than three times a week  . Frequency of Social Gatherings with Friends and Family: More than three times a week  . Attends Religious Services: More than 4 times per year  . Active Member of Clubs or Organizations: Yes  . Attends Archivist Meetings: 1 to 4 times per year  . Marital Status: Married   Additional Social History:     Allergies:   Allergies  Allergen Reactions  . Ace Inhibitors Other (See Comments)    Chest pain  . Amlodipine Besy-Benazepril Hcl Other (See Comments)    Chest pain  . Clopidogrel Bisulfate Other (See Comments)    GI upset  . Erythromycin Other (See Comments)    GI upset  . Pneumovax 23 [Pneumococcal Vac Polyvalent]     Local reaction   . Tape Other (See Comments)    Causes soreness (of the skin)    Labs:  Results for orders placed or performed during the hospital encounter of 08/31/2019 (from the past 48 hour(s))  CBC with Differential  Status: Abnormal   Collection Time: 08/17/2019  4:45 PM  Result Value Ref Range   WBC 9.7 4.0 - 10.5 K/uL   RBC 3.85 (L) 3.87 - 5.11 MIL/uL   Hemoglobin 11.0 (L) 12.0 - 15.0 g/dL   HCT 35.4 (L) 36.0 - 46.0 %   MCV 91.9 80.0 - 100.0 fL   MCH 28.6 26.0 - 34.0 pg   MCHC 31.1 30.0 - 36.0 g/dL   RDW 13.4 11.5 - 15.5 %   Platelets 343 150 - 400 K/uL   nRBC 0.0 0.0 - 0.2 %   Neutrophils Relative % 73 %   Neutro Abs 7.1 1.7 - 7.7 K/uL   Lymphocytes Relative 20 %   Lymphs Abs 1.9 0.7 - 4.0 K/uL   Monocytes Relative 6 %   Monocytes Absolute 0.5 0.1 - 1.0 K/uL   Eosinophils Relative 0 %   Eosinophils Absolute 0.0 0.0 - 0.5 K/uL   Basophils Relative 0 %   Basophils Absolute 0.0 0.0 - 0.1 K/uL   Immature Granulocytes 1 %   Abs Immature Granulocytes 0.08 (H) 0.00 - 0.07 K/uL    Comment: Performed at [facility], [address], [place], [place] [address]  Comprehensive metabolic panel     Status: Abnormal   Collection Time: 08/22/2019  4:45 PM  Result Value Ref Range   Sodium 133 (L) 135 - 145 mmol/L   Potassium 4.3 3.5 - 5.1 mmol/L   Chloride 102 98 - 111 mmol/L   CO2 20 (L) 22 - 32 mmol/L   Glucose, Bld 244 (H) 70 - 99 mg/dL   BUN 14 8 - 23 mg/dL   Creatinine, Ser 1.02 (H) 0.44 - 1.00 mg/dL   Calcium 9.3 8.9 - 10.3 mg/dL   Total Protein 6.1 (L) 6.5 - 8.1 g/dL   Albumin 2.9 (L) 3.5 - 5.0 g/dL   AST 22 15 - 41 U/L   ALT 13 0 - 44  U/L   Alkaline Phosphatase 79 38 - 126 U/L   Total Bilirubin 0.7 0.3 - 1.2 mg/dL   GFR calc non Af Amer 51 (L) >60 mL/min   GFR calc Af Amer 59 (L) >60 mL/min   Anion gap 11 5 - 15    Comment: Performed at [facility], [place] [address] [place], [place] [address]  Troponin I (High Sensitivity)     Status: Abnormal   Collection Time: 09/04/2019  4:45 PM  Result Value Ref Range   Troponin I (High Sensitivity) 70 (H) <18 ng/L    Comment: (NOTE) Elevated high sensitivity troponin I (hsTnI) values and significant  changes across serial measurements may suggest ACS but many other  chronic and acute conditions are known to elevate hsTnI results.  Refer to the "Links" section for chest pain algorithms and additional  guidance. Performed at [facility], [place] [address] [place], [place] [address]   Brain natriuretic peptide     Status: Abnormal   Collection Time: 08/18/2019  4:45 PM  Result Value Ref Range   B Natriuretic Peptide 2,650.2 (H) 0.0 - 100.0 pg/mL    Comment: Performed at [facility] [address] [place], [place] [address]  Respiratory Panel by RT PCR (Flu A&B, Covid) - Nasopharyngeal Swab     Status: None   Collection Time: 09/07/2019  4:50 PM   Specimen: Nasopharyngeal Swab  Result Value Ref Range   SARS Coronavirus 2 by RT PCR NEGATIVE NEGATIVE    Comment: (  NOTE) SARS-CoV-2 target nucleic acids are NOT DETECTED. The SARS-CoV-2 RNA is generally detectable in upper respiratoy specimens during the acute phase of infection. The lowest concentration of SARS-CoV-2 viral copies this assay can detect is 131 copies/mL. A negative result does not preclude SARS-Cov-2 infection and should not be used as the sole basis for treatment or other patient management decisions. A negative result may occur with  improper specimen collection/handling, submission of specimen other than nasopharyngeal swab, presence of viral mutation(s) within the areas targeted by this assay,  and inadequate number of viral copies (<131 copies/mL). A negative result must be combined with clinical observations, patient history, and epidemiological information. The expected result is Negative. Fact Sheet for Patients:  PinkCheek.be Fact Sheet for Healthcare Providers:  GravelBags.it This test is not yet ap proved or cleared by the [place] FDA and  has been authorized for detection and/or diagnosis of SARS-CoV-2 by FDA under an Emergency Use Authorization (EUA). This EUA will remain  in effect (meaning this test can be used) for the duration of the COVID-19 declaration under Section 564(b)(1) of the Act, 21 U.S.C. section 360bbb-3(b)(1), unless the authorization is terminated or revoked sooner.    Influenza A by PCR NEGATIVE NEGATIVE   Influenza B by PCR NEGATIVE NEGATIVE    Comment: (NOTE) The Xpert Xpress SARS-CoV-2/FLU/RSV assay is intended as an aid in  the diagnosis of influenza from Nasopharyngeal swab specimens and  should not be used as a sole basis for treatment. Nasal washings and  aspirates are unacceptable for Xpert Xpress SARS-CoV-2/FLU/RSV  testing. Fact Sheet for Patients: PinkCheek.be Fact Sheet for Healthcare Providers: GravelBags.it This test is not yet approved or cleared by the [place] FDA and  has been authorized for detection and/or diagnosis of SARS-CoV-2 by  FDA under an Emergency Use Authorization (EUA). This EUA will remain  in effect (meaning this test can be used) for the duration of the  Covid-19 declaration under Section 564(b)(1) of the Act, 21  U.S.C. section 360bbb-3(b)(1), unless the authorization is  terminated or revoked. Performed at [facility], [place] [address], [place], [place] [address]   Troponin I (High Sensitivity)     Status: Abnormal   Collection Time: 08/22/2019  6:30 PM  Result Value Ref Range    Troponin I (High Sensitivity) 74 (H) <18 ng/L    Comment: (NOTE) Elevated high sensitivity troponin I (hsTnI) values and significant  changes across serial measurements may suggest ACS but many other  chronic and acute conditions are known to elevate hsTnI results.  Refer to the "Links" section for chest pain algorithms and additional  guidance. Performed at [facility], [place] [address] [place], [place] [address]   Troponin I (High Sensitivity)     Status: Abnormal   Collection Time: 08/26/2019  8:49 PM  Result Value Ref Range   Troponin I (High Sensitivity) 76 (H) <18 ng/L    Comment: (NOTE) Elevated high sensitivity troponin I (hsTnI) values and significant  changes across serial measurements may suggest ACS but many other  chronic and acute conditions are known to elevate hsTnI results.  Refer to the "Links" section for chest pain algorithms and additional  guidance. Performed at [facility], [place] [address] [place], [place] [address]   D-dimer, quantitative (not at [facility])     Status: Abnormal   Collection Time: 09/09/2019  8:49 PM  Result Value Ref Range   D-Dimer, Quant 1.67 (H) 0.00 - 0.50 ug/mL-FEU  Comment: (NOTE) At the manufacturer cut-off of 0.50 ug/mL FEU, this assay has been documented to exclude PE with a sensitivity and negative predictive value of 97 to 99%.  At this time, this assay has not been approved by the FDA to exclude DVT/VTE. Results should be correlated with clinical presentation. Performed at [facility], [place] [address], [place], [place] [address]   Glucose, capillary     Status: Abnormal   Collection Time: 08/29/2019  9:26 PM  Result Value Ref Range   Glucose-Capillary 213 (H) 70 - 99 mg/dL   Comment 1 Notify RN    Comment 2 Document in Chart   Troponin I (High Sensitivity)     Status: Abnormal   Collection Time: 09/11/2019 10:40 PM  Result Value Ref Range   Troponin I (High Sensitivity) 81 (H) <18 ng/L    Comment:  (NOTE) Elevated high sensitivity troponin I (hsTnI) values and significant  changes across serial measurements may suggest ACS but many other  chronic and acute conditions are known to elevate hsTnI results.  Refer to the "Links" section for chest pain algorithms and additional  guidance. Performed at [facility], [place] [address], [place], [place] [id number]   Basic metabolic panel     Status: Abnormal   Collection Time: 08/31/19  5:35 AM  Result Value Ref Range   Sodium 134 (L) 135 - 145 mmol/L   Potassium 4.6 3.5 - 5.1 mmol/L   Chloride 101 98 - 111 mmol/L   CO2 20 (L) 22 - 32 mmol/L   Glucose, Bld 300 (H) 70 - 99 mg/dL   BUN 18 8 - 23 mg/dL   Creatinine, Ser 1.19 (H) 0.44 - 1.00 mg/dL   Calcium 9.3 8.9 - 10.3 mg/dL   GFR calc non Af Amer 42 (L) >60 mL/min   GFR calc Af Amer 49 (L) >60 mL/min   Anion gap 13 5 - 15    Comment: Performed at [facility] [address], [place], [place] [address]  Glucose, capillary     Status: Abnormal   Collection Time: 08/31/19  6:21 AM  Result Value Ref Range   Glucose-Capillary 196 (H) 70 - 99 mg/dL   Comment 1 Notify RN    Comment 2 Document in Chart   Glucose, capillary     Status: Abnormal   Collection Time: 08/31/19 11:28 AM  Result Value Ref Range   Glucose-Capillary 236 (H) 70 - 99 mg/dL  Glucose, capillary     Status: Abnormal   Collection Time: 08/31/19  4:36 PM  Result Value Ref Range   Glucose-Capillary 206 (H) 70 - 99 mg/dL  Procalcitonin - Baseline     Status: None   Collection Time: 08/31/19  7:20 PM  Result Value Ref Range   Procalcitonin <0.10 ng/mL    Comment:        Interpretation: PCT (Procalcitonin) <= 0.5 ng/mL: Systemic infection (sepsis) is not likely. Local bacterial infection is possible. (NOTE)       Sepsis PCT Algorithm           Lower Respiratory Tract                                      Infection PCT Algorithm    ----------------------------     ----------------------------         PCT <  0.25 ng/mL  PCT < 0.10 ng/mL         Strongly encourage             Strongly discourage   discontinuation of antibiotics    initiation of antibiotics    ----------------------------     -----------------------------       PCT 0.25 - 0.50 ng/mL            PCT 0.10 - 0.25 ng/mL               OR       >80% decrease in PCT            Discourage initiation of                                            antibiotics      Encourage discontinuation           of antibiotics    ----------------------------     -----------------------------         PCT >= 0.50 ng/mL              PCT 0.26 - 0.50 ng/mL               AND        <80% decrease in PCT             Encourage initiation of                                             antibiotics       Encourage continuation           of antibiotics    ----------------------------     -----------------------------        PCT >= 0.50 ng/mL                  PCT > 0.50 ng/mL               AND         increase in PCT                  Strongly encourage                                      initiation of antibiotics    Strongly encourage escalation           of antibiotics                                     -----------------------------                                           PCT <= 0.25 ng/mL                                                 OR                                        >  80% decrease in PCT                                     Discontinue / Do not initiate                                             antibiotics Performed at [facility], [place] [address] [place], [place] [address]   Glucose, capillary     Status: Abnormal   Collection Time: 08/31/19  8:08 PM  Result Value Ref Range   Glucose-Capillary 168 (H) 70 - 99 mg/dL  Glucose, capillary     Status: Abnormal   Collection Time: 08/31/19  9:39 PM  Result Value Ref Range   Glucose-Capillary 132 (H) 70 - 99 mg/dL   Comment 1 Notify RN    Comment 2 Document in Chart    Glucose, capillary     Status: Abnormal   Collection Time: 09/01/19  6:19 AM  Result Value Ref Range   Glucose-Capillary 49 (L) 70 - 99 mg/dL  Glucose, capillary     Status: Abnormal   Collection Time: 09/01/19  6:45 AM  Result Value Ref Range   Glucose-Capillary 69 (L) 70 - 99 mg/dL  Glucose, capillary     Status: Abnormal   Collection Time: 09/01/19  6:55 AM  Result Value Ref Range   Glucose-Capillary 68 (L) 70 - 99 mg/dL   Comment 1 Repeat Test   Glucose, capillary     Status: Abnormal   Collection Time: 09/01/19  7:24 AM  Result Value Ref Range   Glucose-Capillary 121 (H) 70 - 99 mg/dL  Lactic acid, plasma     Status: Abnormal   Collection Time: 09/01/19 10:58 AM  Result Value Ref Range   Lactic Acid, Venous 8.1 (HH) 0.5 - 1.9 mmol/L    Comment: CRITICAL RESULT CALLED TO, READ BACK BY AND VERIFIED WITH: Evonnie Pat RN [id number] M GARRETT Performed at [facility], [place] [address], [place], [place] [address]   Glucose, capillary     Status: Abnormal   Collection Time: 09/01/19 11:16 AM  Result Value Ref Range   Glucose-Capillary 157 (H) 70 - 99 mg/dL    Medications:  Current Facility-Administered Medications  Medication Dose Route Frequency Provider Last Rate Last Admin  . acetaminophen (TYLENOL) tablet 650 mg  650 mg Oral Q4H PRN Almyra Deforest, PA      . alum & mag hydroxide-simeth (MAALOX/MYLANTA) 200-200-20 MG/5ML suspension 30 mL  30 mL Oral Once [place], Bhavinkumar, PA      . apixaban (ELIQUIS) tablet 2.5 mg  2.5 mg Oral BID Almyra Deforest, PA   2.5 mg at 09/01/19 0906  . atorvastatin (LIPITOR) tablet 20 mg  20 mg Oral q1800 Almyra Deforest, [place]   20 mg at 08/31/19 1715  . azithromycin (ZITHROMAX) tablet 500 mg  500 mg Oral Daily Furth, Cadence H, PA-C       Followed by  . [START ON 09/02/2019] azithromycin (ZITHROMAX) tablet 250 mg  250 mg Oral Daily Furth, Cadence H, PA-C      . cefTRIAXone (ROCEPHIN) injection 1 g  1 g Intramuscular Q24H Furth, Cadence H, PA-C      .  famotidine (PEPCID) tablet 20 mg  20 mg Oral Daily Nahser, Wonda Cheng,  MD   20 mg at 09/01/19 0906  . furosemide (LASIX) injection 40 mg  40 mg Intravenous BID Nahser, Wonda Cheng, MD   40 mg at 09/01/19 0854  . insulin aspart (novoLOG) injection 0-15 Units  0-15 Units Subcutaneous TID WC Norval Morton, MD   2 Units at 09/01/19 0853  . metFORMIN (GLUCOPHAGE) tablet 1,000 mg  1,000 mg Oral BID WC Almyra Deforest, PA   1,000 mg at 08/31/19 1715  . nitroGLYCERIN (NITROSTAT) SL tablet 0.4 mg  0.4 mg Sublingual Q5 Min x 3 PRN Almyra Deforest, PA      . ondansetron (ZOFRAN) injection 4 mg  4 mg Intravenous Q6H PRN Almyra Deforest, PA      . PARoxetine (PAXIL) tablet 40 mg  40 mg Oral QHS Almyra Deforest, [place]   40 mg at 08/31/19 2010    Musculoskeletal: Strength & Muscle Tone: Unable to assess Gait & Station: Unable to assess Patient leans: Unable to assess  Psychiatric Specialty Exam: Physical Exam  Nursing note and vitals reviewed. Constitutional: She appears well-developed.  HENT:  Head: Normocephalic.  Cardiovascular: Normal rate.  Respiratory: Effort normal.  Psychiatric: Her speech is slurred. Cognition and memory are impaired.    Review of Systems  Constitutional: Negative.   HENT: Negative.   Eyes: Negative.   Respiratory: Negative.   Cardiovascular: Negative.   Gastrointestinal: Negative.   Genitourinary: Negative.   Musculoskeletal: Negative.   Skin: Negative.   Neurological: Negative.     Blood pressure 125/66, pulse (!) 55, temperature (!) 97.3 F (36.3 C), temperature source Oral, resp. rate 20, height 5' (1.524 m), weight 47.9 kg, SpO2 100 %.Body mass index is 20.62 kg/m.  General Appearance: Disheveled  Eye Contact:  Minimal  Speech:  Slurred  Volume:  Decreased  Mood:  Unable to assess  Affect:  Congruent  Thought Process:  Disorganized and Descriptions of Associations: Loose  Orientation:  Other:  Alert to self only  Thought Content:  Unable to assess  Suicidal Thoughts:  Unable to  assess  Homicidal Thoughts:  Unable to assess  Memory:  Immediate;   Poor Recent;   Poor  Judgement:  Impaired  Insight:  Unable to assess  Psychomotor Activity:  Normal  Concentration:  Concentration: Poor  Recall:  Unable to assess  Fund of Knowledge:  Unable to assess  Language:  Unable to assess  Akathisia:  No  Handed:  Right  AIMS (if indicated):     Assets:  Communication Skills Desire for Improvement Social Support  ADL's:  Impaired  Cognition:  Impaired,  Severe  Sleep:        Treatment Plan Summary: Medication management  86 year old female patient presents with acute encephalopathy, causes may be multifactorial.  Likely related to infectious process (suspected community-acquired pneumonia). Of note that patient is on Paxil 40 mg daily at home, this medication has anticholinergic side effects which could possibly contribute, recommend consider decrease Paxil dose to 20 mg daily. For acute agitation consider low-dose risperidone 0.25 mg as needed twice daily, if not acutely agitated consider nonpharmacological means of reorienting patient examples include quiet room or providing reassurance. Please note antipsychotics can be associated with increased risk of death in the elderly and the use of these medications is off label. Case discussed with Dr. Parke Poisson     Disposition: No evidence of imminent risk to self or others at present.   Patient does not meet criteria for psychiatric inpatient admission.  This service was provided via telemedicine  using a 2-way, interactive audio and Radiographer, therapeutic.  Names of all persons participating in this telemedicine service and their role in this encounter. Name: Esmond Harps Role: Patient  Name: Letitia Libra Role: [place], [place] 09/01/2019 12:01 PM

## 2019-09-01 NOTE — Consult Note (Signed)
Neurology Consultation  Reason for Consult: Stroke Referring Physician: Cathie Olden  CC: Stroke  History is obtained from: Chart  HPI: ADEBOLA BIRCHARD is a 86 y.o. female with history of TIA, stroke in 2020-left temporal infarct, paroxysmal supraventricular tachycardia, PVD, hypertension, hyperlipidemia, diabetes and carotid artery stenosis.  Neurology was consulted secondary to stroke found on MRI.   Patient presented confused and apparently stopped all her medications 5 to 7 days ago.  Patient woke up short of breath on day of admission.  She apparently stopped eating for the past several days.  Patient presented with what appeared to be new onset atrial fibrillation and was also found to be in supraventricular tachycardia that resolved with adenosine.  In August 2020 she had an ejection fraction of 45 to 50% however now to be found with less than 20%.  Due to acute encephalopathy and hallucinations patient was sent for MRI for possible stroke versus infection.  MRI did reveal a small acute infarct in the anterior left frontal white matter.  Is for that reason that neurology was consulted.    On consultation patient is very confused, pale but does follow instructions.  She is minimally verbal.  Patient shows no significant neurological deficits.  Please see below for neuro exam   Relevant labs include -glucose ranging from 357-244, troponin II days ago 74 and 81, D-dimer 2 days ago 1.67.  Chart review on 02/18/2019 patient was found to have a small left posterior temporal lobe infarct embolic likely incidental secondary to procedure cardiac catheterization.  Carotid Doppler on the right ICA 40 to 59% and left ICA within normal limits, LDL was 50, HbA1c was 6.4.   LKW: Unknown tpa given?: no, no known last known normal Premorbid modified Rankin scale (mRS): 0 ICH Score: 2   Past Medical History:  Diagnosis Date  . Allergy    allergic rhinitis  . Anxiety   . Arthritis    osteoarthritis  .  CAD (coronary artery disease)    a. s/p prior stenting of the LAD and D1 (02/2004); b. 02/2019 NSTEMI in setting of PSVT/Cath: >M 53m/d, LAD 40ost/p/m, D1 60, LCX 80m, 50d, RCA  nl-->Med Rx.  . Carotid arterial disease ([place])    a. 02/2019 Carotid U/S: RICA [id number], LICA [id number]. Antegrade bilat vert flow.  . Degenerative disk disease    in neck  . Diabetes mellitus    type II  . Fracture of femoral neck, left ([place]) 01/21/2018  . Hyperlipidemia   . Hypertension   . Hypoaldosteronism ([place]) 6/12   from DM causing hyperkalemia   . Hyponatremia    a. 02/2019 in setting of chlorthalidone rx (d/c'd).  . Ischemic cardiomyopathy    a. 11/2016 Echo: EF 55-60%, Gr1 DD; b. 02/2019 Echo: EF 45-50%, sev basal-mid infsept, inf, inflat HK. DD. Sev dil LA.  . Osteopenia   . Periodontal disease   . Peripheral vascular disease ([place])   . PSVT (paroxysmal supraventricular tachycardia) ([place])    a. 02/2019 - in absence of BB therapy-->broke w/ adenosine.  . Stroke [facility])    a. 02/2019 MRI: L temporal infarct (1 day following cardiac cath).  . Transient cerebral ischemia 11/06/2016    Family History  Problem Relation Age of Onset  . Cancer Mother        vaginal CA in situ  . Hypertension Mother   . Heart disease Mother        CAD and MI  . Heart disease Father  CAD  . Diabetes Father   . Heart disease Son        congenital valve problem   Social History:   reports that she has never smoked. She has never used smokeless tobacco. She reports that she does not drink alcohol or use drugs.  Medications  Current Facility-Administered Medications:  .  acetaminophen (TYLENOL) tablet 650 mg, 650 mg, Oral, Q4H PRN, Almyra Deforest, PA .  alum & mag hydroxide-simeth (MAALOX/MYLANTA) 200-200-20 MG/5ML suspension 30 mL, 30 mL, Oral, Once, Bhagat, Bhavinkumar, PA .  apixaban (ELIQUIS) tablet 2.5 mg, 2.5 mg, Oral, BID, Lago, [place], PA, 2.5 mg at 09/01/19 0906 .  atorvastatin (LIPITOR) tablet 20 mg, 20 mg, Oral, q1800, Almyra Deforest, PA, 20 mg at 08/31/19 1715 .  azithromycin (ZITHROMAX) tablet 500 mg, 500 mg, Oral, Daily **FOLLOWED BY** [START ON 09/02/2019] azithromycin (ZITHROMAX) tablet 250 mg, 250 mg, Oral, Daily, Furth, Cadence H, PA-C .  cefTRIAXone (ROCEPHIN) injection 1 g, 1 g, Intramuscular, Q24H, Furth, Cadence H, PA-C .  famotidine (PEPCID) tablet 20 mg, 20 mg, Oral, Daily, Nahser, Wonda Cheng, MD, 20 mg at 09/01/19 0906 .  furosemide (LASIX) injection 40 mg, 40 mg, Intravenous, BID, Nahser, Wonda Cheng, MD, 40 mg at 09/01/19 0854 .  insulin aspart (novoLOG) injection 0-15 Units, 0-15 Units, Subcutaneous, TID WC, Norval Morton, MD, 2 Units at 09/01/19 [phone] .  metFORMIN (GLUCOPHAGE) tablet 1,000 mg, 1,000 mg, Oral, BID WC, Meng, [place], PA, 1,000 mg at 08/31/19 1715 .  metoprolol succinate (TOPROL-XL) 24 hr tablet 25 mg, 25 mg, Oral, BID, Furth, Cadence H, PA-C .  nitroGLYCERIN (NITROSTAT) SL tablet 0.4 mg, 0.4 mg, Sublingual, Q5 Min x 3 PRN, Meng, Hao, PA .  ondansetron (ZOFRAN) injection 4 mg, 4 mg, Intravenous, Q6H PRN, Almyra Deforest, PA .  PARoxetine (PAXIL) tablet 40 mg, 40 mg, Oral, QHS, Meng, [place], [place], 40 mg at 08/31/19 2010  ROS: Unable to obtain secondary to confusion  Exam: Current vital signs: BP 125/66 (BP Location: Right Arm)   Pulse (!) 55   Temp (!) 97.3 F (36.3 C) (Oral)   Resp 20   Ht 5' (1.524 m)   Wt 47.9 kg Comment: scale a  SpO2 100%   BMI 20.62 kg/m  Vital signs in last 24 hours: Temp:  [97 F (36.1 C)-98 F (36.7 C)] 97.3 F (36.3 C) (02/17 0901) Pulse Rate:  [55-87] 55 (02/17 0901) Resp:  [18-20] 20 (02/17 0901) BP: (125-148)/(58-107) 125/66 (02/17 0901) SpO2:  [99 %-100 %] 100 % (02/17 0901) Weight:  [47.9 kg] 47.9 kg (02/17 0452)   Constitutional: Cachectic, malnourished, pale Eyes: No scleral injection HENT: No OP obstrucion Head: Normocephalic.  Cardiovascular: Palpable pulse Respiratory: Effort normal, non-labored breathing GI: Soft.  No distension. There is no  tenderness.  Skin: WDI  Neuro: Mental Status: Patient is awake, not alert to person, year, month. Speech-minimal with difficulty naming, repeating Unable to give a clear history Cranial Nerves: II: Visual Fields are full.  III,IV, VI: EOMI without ptosis or diploplia. Pupils equal, round and reactive to light V: Facial sensation is symmetric to temperature VII: Facial movement is symmetric.  VIII: hearing is intact to voice X: Palat elevates symmetrically XI: Shoulder shrug is symmetric. XII: tongue is midline without atrophy or fasciculations.  Motor: Tone is normal. Bulk is decreased.  Moving all extremities antigravity with good strength. No drift Sensory: Sensation intact in all extremities to noxious stimuli Deep Tendon Reflexes: Difficult to obtain as patient had intermittent  periods that she would not relax however appeared 1+ throughout with the exception of Achilles Plantars: Toes are downgoing bilaterally.  Cerebellar: FNF within normal limits  Labs I have reviewed labs in epic and the results pertinent to this consultation are:   CBC    Component Value Date/Time   WBC 9.7 08/31/2019 1645   RBC 3.85 (L) 08/22/2019 1645   HGB 11.0 (L) 08/27/2019 1645   HCT 35.4 (L) 09/05/2019 1645   PLT 343 08/24/2019 1645   MCV 91.9 09/01/2019 1645   MCH 28.6 08/26/2019 1645   MCHC 31.1 09/12/2019 1645   RDW 13.4 09/02/2019 1645   LYMPHSABS 1.9 09/09/2019 1645   MONOABS 0.5 09/09/2019 1645   EOSABS 0.0 09/02/2019 1645   BASOSABS 0.0 08/27/2019 1645    CMP     Component Value Date/Time   NA 134 (L) 08/31/2019 0535   K 4.6 08/31/2019 0535   CL 101 08/31/2019 0535   CO2 20 (L) 08/31/2019 0535   GLUCOSE 300 (H) 08/31/2019 0535   BUN 18 08/31/2019 0535   CREATININE 1.19 (H) 08/31/2019 0535   CREATININE 0.88 11/27/2010 0916   CALCIUM 9.3 08/31/2019 0535   PROT 6.1 (L) 08/19/2019 1645   ALBUMIN 2.9 (L) 09/02/2019 1645   AST 22 08/29/2019 1645   ALT 13 08/31/2019  1645   ALKPHOS 79 08/23/2019 1645   BILITOT 0.7 08/23/2019 1645   GFRNONAA 42 (L) 08/31/2019 0535   GFRAA 49 (L) 08/31/2019 0535    Lipid Panel     Component Value Date/Time   CHOL 223 (H) 07/20/2019 1459   TRIG 242.0 (H) 07/20/2019 1459   HDL 41.00 07/20/2019 1459   CHOLHDL 5 07/20/2019 1459   VLDL 48.4 (H) 07/20/2019 1459   LDLCALC 50 02/18/2019 0420   LDLDIRECT 151.0 07/20/2019 1459   Echocardiogram-left ventricular ejection fraction, by estimation, is less than 20%.  Left ventricle has severely decreased function.  The left ventricle demonstrates global hypokinesis.  Left ventricle goal diastolic parameters are also consistent with grade 3 diastolic dysfunction  Imaging I have reviewed the images obtained:  MRI examination of the brain-- IMPRESSION: 1. Truncated and motion degraded study, only diffusion imaging was acquired. 2. Small acute infarct in the anterior left frontal white matter.  Etta Quill PA-C [facility] [phone]  M-F  (9:00 am- 5:00 PM)  09/01/2019, 11:41 AM   I have seen the patient reviewed the above note.  She has had a fairly rapid decline both from a cardiology standpoint as well as cognitively.  Assessment:  This is an unfortunate 86 year old female presenting to the hospital secondary to shortness of breath.  With further work-up while she was in the hospital patient was noted to have delirium and hallucinations for which neurology was consulted.  MRI was obtained which did show as above a small acute infarct in the anterior left frontal white matter.  Stroke is likely secondary to new onset A. Fib.  Patient was on Aggrenox at home however has been converted to Eliquis secondary to the new onset A. fib.  At this point palliative care is being considered.  I suspect that her mental status is likely multifactorial including her pneumonia, possibly related to CHF, and her stroke may have played a role in the most acute worsening, but is  not the primary driver of her cognitive decline.  Cardiology is discussing palliation with family, and I see by palliative care note that comfort care is being pursued.  Neurology will be available  if the patient chooses to pursue aggressive care.  I think her delirium is likely multifactorial and management if she pursues comfort care would be symptomatic.  Recommendations: 1) If she does continue to desire aggressive care, neurology will be available to help guide that therapy. 2) please call if any further questions or concerns.  Roland Rack, MD [facility] [phone]  If 7pm- 7am, please page neurology on call as listed in [place].

## 2019-09-01 NOTE — Progress Notes (Addendum)
    Pt condition has rapid declined. She has severe CHF  Recent CVA Acute renal insufficiency Metabolic acidosis Paroxysmal atrial fib  I have spoken to one of her sons - Abbe Amsterdam and have advised him to bring Ms. Baldree's husband to come and visit . I was only able to give him limited information since he was not on the DPR but I felt it was important to let him know since I have called the husbands phone several times with no answwer.   Have made her a DNR / comfort care.      Mertie Moores, MD  09/01/2019 1:21 PM    [facility] [place] [place],  [address], [place]  [address] Phone: [phone]; Fax: [phone]    Addendum:  I have met and spoken to the family.  Husband states that she has a living will and does not want to be kept alive on a vent.  The whole family agrees with palliative care / comfort care.  Will ask Dr. Erlinda Hong and Palliative care for assistance in further meds, morphine drip     Mertie Moores, MD  09/01/2019 5:03 PM    [facility] [place],  [address], [place]  [address] Phone: [phone]; Fax: [phone]

## 2019-09-01 NOTE — Progress Notes (Signed)
Lab called with a critical lactic acid value of 8.1. MD paged and notified.

## 2019-09-01 NOTE — Progress Notes (Addendum)
Progress Note  Patient Name: Stephanie [name] Date of Encounter: 09/01/2019  Primary Cardiologist: Ida Rogue, MD   Subjective   Patient converted to NSR/sinus brady overnight. Appears to have brief episodes of heart block vs CHB on telemetry with HR in the 30s.   Echo showed new low <20%. Weight down 2 lbs since admission. Unable  CTA chest negative for PE but shows possible multifocal PNA  MRI brain today aborted early due to fidgeting but showed small acute infarct in the anterior left frontal white matter  Patient is A and Ox1. Nurse says the patient is more confused than yesterday. Has been having BMs in the bed. Nurse has been unable to get urine sample.   Inpatient Medications    Scheduled Meds: . alum & mag hydroxide-simeth  30 mL Oral Once  . apixaban  2.5 mg Oral BID  . atorvastatin  20 mg Oral q1800  . famotidine  20 mg Oral Daily  . furosemide  40 mg Intravenous BID  . insulin aspart  0-15 Units Subcutaneous TID WC  . metFORMIN  1,000 mg Oral BID WC  . metoprolol succinate  50 mg Oral BID  . PARoxetine  40 mg Oral QHS   Continuous Infusions:  PRN Meds: acetaminophen, nitroGLYCERIN, ondansetron (ZOFRAN) IV   Vital Signs    Vitals:   08/31/19 1150 08/31/19 1959 09/01/19 0452 09/01/19 0901  BP: 134/76 (!) 148/107 (!) 131/58 125/66  Pulse: 87 74 (!) 58 (!) 55  Resp: 18 18 18 20   Temp: 98 F (36.7 C) (!) 97.5 F (36.4 C) (!) 97 F (36.1 C) (!) 97.3 F (36.3 C)  TempSrc:  Oral  Oral  SpO2: 100% 99% 100% 100%  Weight:   47.9 kg   Height:        Intake/Output Summary (Last 24 hours) at 09/01/2019 1023 Last data filed at 09/01/2019 0016 Gross per 24 hour  Intake 480 ml  Output 100 ml  Net 380 ml   Last 3 Weights 09/01/2019 08/31/2019 09/02/2019  Weight (lbs) 105 lb 9.6 oz 106 lb 14.4 oz 107 lb 14.4 oz  Weight (kg) 47.9 kg 48.49 kg 48.943 kg      Telemetry    NSR/sinus bradycardia, Hr 50-60s; transient Heart block vs ?CHB rates in the 30s. PVcs -  Personally Reviewed  ECG    Sinus bradycardia - Personally Reviewed  Physical Exam   GEN: No acute distress.   Neck: No JVD Cardiac: RRR, no murmurs, rubs, or gallops.  Respiratory: Diminished breath sounds at bases. GI: Soft, nontender, non-distended  MS: No edema; No deformity. Neuro:  Nonfocal  Psych: Normal affect   Labs    High Sensitivity Troponin:   Recent Labs  Lab 09/06/2019 1645 08/24/2019 1830 09/07/2019 2049 09/08/2019 2240  TROPONINIHS 70* 74* 76* 81*      Chemistry Recent Labs  Lab 09/06/2019 1645 08/31/19 0535  NA 133* 134*  K 4.3 4.6  CL 102 101  CO2 20* 20*  GLUCOSE 244* 300*  BUN 14 18  CREATININE 1.02* 1.19*  CALCIUM 9.3 9.3  PROT 6.1*  --   ALBUMIN 2.9*  --   AST 22  --   ALT 13  --   ALKPHOS 79  --   BILITOT 0.7  --   GFRNONAA 51* 42*  GFRAA 59* 49*  ANIONGAP 11 13     Hematology Recent Labs  Lab 09/07/2019 1645  WBC 9.7  RBC 3.85*  HGB 11.0*  HCT  35.4*  MCV 91.9  MCH 28.6  MCHC 31.1  RDW 13.4  PLT 343    BNP Recent Labs  Lab 08/26/2019 1645  BNP 2,650.2*     DDimer  Recent Labs  Lab 08/20/2019 2049  DDIMER 1.67*     Radiology    CT ANGIO CHEST PE W OR WO CONTRAST  Result Date: 08/31/2019 CLINICAL DATA:  Shortness of breath and elevated D-dimer EXAM: CT ANGIOGRAPHY CHEST WITH CONTRAST TECHNIQUE: Multidetector CT imaging of the chest was performed using the standard protocol during bolus administration of intravenous contrast. Multiplanar CT image reconstructions and MIPs were obtained to evaluate the vascular anatomy. CONTRAST:  89mL OMNIPAQUE IOHEXOL 350 MG/ML SOLN COMPARISON:  Chest x-ray from previous day. FINDINGS: Cardiovascular: Atherosclerotic calcifications thoracic aorta noted. No aneurysmal dilatation is seen. Opacification is decreased limiting evaluation for dissection. The pulmonary artery shows a normal branching pattern. No filling defect to suggest pulmonary embolus is noted. Coronary calcifications are noted.  Cardiac enlargement is noted. Mediastinum/Nodes: Thoracic inlet is within normal limits. No hilar or mediastinal adenopathy is noted. The esophagus as visualized is within normal limits. Lungs/Pleura: Large bilateral pleural effusions are noted. Patchy multifocal infiltrates are noted within the upper lobes bilaterally as well as lower lobe consolidation. These changes are consistent with multifocal pneumonia. Upper Abdomen: Visualized upper abdomen demonstrates heavily calcified splenic artery aneurysm measuring approximately 2.1 cm. This is roughly stable when compared with prior CTs of the abdomen. Musculoskeletal: Degenerative changes of the thoracic spine are noted. Review of the MIP images confirms the above findings. IMPRESSION: Large bilateral pleural effusions with associated lower lobe consolidation. Multifocal infiltrates in the upper lobes are noted bilaterally. These changes are likely infectious in etiology. No evidence of pulmonary emboli. Stable calcified splenic artery aneurysm. Electronically Signed   By: Inez Catalina M.D.   On: 08/31/2019 14:20   MR BRAIN WO CONTRAST  Result Date: 09/01/2019 CLINICAL DATA:  Encephalopathy EXAM: MRI HEAD WITHOUT CONTRAST TECHNIQUE: Multiplanar, multiecho pulse sequences of the brain and surrounding structures were obtained without intravenous contrast. COMPARISON:  Brain MRI 02/17/2019 FINDINGS: Significantly motion degraded study which was aborted early. Only diffusion imaging was acquired (axial and coronal) and is positive for a linear infarct in the left anterior frontal white matter. No hydrocephalus or shift. IMPRESSION: 1. Truncated and motion degraded study, only diffusion imaging was acquired. 2. Small acute infarct in the anterior left frontal white matter. Electronically Signed   By: Monte Fantasia M.D.   On: 09/01/2019 06:32   DG Chest Portable 1 View  Result Date: 09/02/2019 CLINICAL DATA:  Shortness of breath and chest pain EXAM: PORTABLE  CHEST 1 VIEW COMPARISON:  02/15/2019 FINDINGS: Cardiac shadow is stable. Aortic calcifications are again seen. Diffuse vascular congestion is noted with interstitial edema. Small effusions are noted as well. Likely underlying atelectasis is seen as well. No bony abnormality is noted. IMPRESSION: Changes consistent with CHF. Bibasilar atelectatic changes are also noted. Electronically Signed   By: Inez Catalina M.D.   On: 08/19/2019 16:46   ECHOCARDIOGRAM COMPLETE  Result Date: 08/31/2019    ECHOCARDIOGRAM REPORT   Patient Name:   MALAYNA TEST Date of Exam: 08/31/2019 Medical Rec #:  [id number]      Height:       60.0 in Accession #:    [id number]     Weight:       106.9 lb Date of Birth:  07-21-1933      BSA:  1.43 m Patient Age:    31 years       BP:           144/97 mmHg Patient Gender: F              HR:           87 bpm. Exam Location:  Inpatient Procedure: 2D Echo, Cardiac Doppler and Color Doppler Indications:    Chest pain  History:        Patient has prior history of Echocardiogram examinations, most                 recent 02/16/2019. CHF, CAD, Stroke, Arrythmias:Atrial                 Fibrillation, Signs/Symptoms:Shortness of Breath and Altered                 Mental Status; Risk Factors:Hypertension, Dyslipidemia and                 Diabetes.  Sonographer:    Dustin Flock Referring Phys: [phone] HAO MENG IMPRESSIONS  1. Left ventricular ejection fraction, by estimation, is <20%. The left ventricle has severely decreased function. The left ventricle demonstrates global hypokinesis. Left ventricular diastolic parameters are consistent with Grade III diastolic dysfunction (restrictive).  2. Right ventricular systolic function is normal. The right ventricular size is normal. There is moderately elevated pulmonary artery systolic pressure.  3. Left atrial size was severely dilated.  4. The mitral valve is normal in structure and function. Mild mitral valve regurgitation. No evidence of mitral  stenosis.  5. Tricuspid valve regurgitation is moderate.  6. The aortic valve is normal in structure and function. Aortic valve regurgitation is not visualized. Mild to moderate aortic valve sclerosis/calcification is present, without any evidence of aortic stenosis.  7. The inferior vena cava is dilated in size with >50% respiratory variability, suggesting right atrial pressure of 8 mmHg. Comparison(s): The left ventricular function is worsened. FINDINGS  Left Ventricle: Left ventricular ejection fraction, by estimation, is <20%. The left ventricle has severely decreased function. The left ventricle demonstrates global hypokinesis. The left ventricular internal cavity size was normal in size. There is no  left ventricular hypertrophy. Left ventricular diastolic parameters are consistent with Grade III diastolic dysfunction (restrictive). Right Ventricle: The right ventricular size is normal. No increase in right ventricular wall thickness. Right ventricular systolic function is normal. There is moderately elevated pulmonary artery systolic pressure. The tricuspid regurgitant velocity is 3.37 m/s, and with an assumed right atrial pressure of 8 mmHg, the estimated right ventricular systolic pressure is [id number] mmHg. Left Atrium: Left atrial size was severely dilated. Right Atrium: Right atrial size was normal in size. Pericardium: There is no evidence of pericardial effusion. Mitral Valve: The mitral valve is normal in structure and function. Normal mobility of the mitral valve leaflets. Mild mitral valve regurgitation. No evidence of mitral valve stenosis. Tricuspid Valve: The tricuspid valve is normal in structure. Tricuspid valve regurgitation is moderate . No evidence of tricuspid stenosis. Aortic Valve: The aortic valve is normal in structure and function.. There is moderate thickening and moderate calcification of the aortic valve. Aortic valve regurgitation is not visualized. Mild to moderate aortic valve  sclerosis/calcification is present, without any evidence of aortic stenosis. There is moderate thickening of the aortic valve. There is moderate calcification of the aortic valve. Pulmonic Valve: The pulmonic valve was normal in structure. Pulmonic valve regurgitation is mild to moderate. No evidence of  pulmonic stenosis. Aorta: The aortic root is normal in size and structure. Venous: The inferior vena cava is dilated in size with greater than 50% respiratory variability, suggesting right atrial pressure of 8 mmHg. IAS/Shunts: No atrial level shunt detected by color flow Doppler.  LEFT VENTRICLE PLAX 2D LVIDd:         4.31 cm  Diastology LVIDs:         3.91 cm  LV e' lateral:   6.31 cm/s LV PW:         0.87 cm  LV E/e' lateral: 19.3 LV IVS:        0.87 cm  LV e' medial:    4.35 cm/s LVOT diam:     1.70 cm  LV E/e' medial:  28.0 LV SV:         31.78 ml LV SV Index:   11.99 LVOT Area:     2.27 cm  RIGHT VENTRICLE RV Basal diam:  2.33 cm RV S prime:     6.31 cm/s TAPSE (M-mode): 1.7 cm LEFT ATRIUM             Index       RIGHT ATRIUM           Index LA diam:        4.70 cm 3.29 cm/m  RA Area:     16.80 cm LA Vol (A2C):   93.0 ml 65.02 ml/m RA Volume:   48.60 ml  33.98 ml/m LA Vol (A4C):   80.7 ml 56.42 ml/m LA Biplane Vol: 91.4 ml 63.90 ml/m  AORTIC VALVE LVOT Vmax:   74.60 cm/s LVOT Vmean:  47.800 cm/s LVOT VTI:    0.140 m  AORTA Ao Root diam: 2.80 cm MITRAL VALVE                TRICUSPID VALVE MV Area (PHT): 4.31 cm     TR Peak grad:   45.4 mmHg MV Decel Time: 176 msec     TR Vmax:        337.00 cm/s MV E velocity: 122.00 cm/s MV A velocity: 24.50 cm/s   SHUNTS MV E/A ratio:  4.98         Systemic VTI:  0.14 m                             Systemic Diam: 1.70 cm Candee Furbish MD Electronically signed by Candee Furbish MD Signature Date/Time: 08/31/2019/1:27:42 PM    Final     Cardiac Studies   Echo 08/31/19 1. Left ventricular ejection fraction, by estimation, is <20%. The left  ventricle has severely decreased  function. The left ventricle demonstrates  global hypokinesis. Left ventricular diastolic parameters are consistent  with Grade III diastolic  dysfunction (restrictive).  2. Right ventricular systolic function is normal. The right ventricular  size is normal. There is moderately elevated pulmonary artery systolic  pressure.  3. Left atrial size was severely dilated.  4. The mitral valve is normal in structure and function. Mild mitral  valve regurgitation. No evidence of mitral stenosis.  5. Tricuspid valve regurgitation is moderate.  6. The aortic valve is normal in structure and function. Aortic valve  regurgitation is not visualized. Mild to moderate aortic valve  sclerosis/calcification is present, without any evidence of aortic  stenosis.  7. The inferior vena cava is dilated in size with >50% respiratory  variability, suggesting right atrial pressure of 8 mmHg.  Patient Profile     86 y.o. female with hx of medially managed CAD, carotid artery disease, HTN, HLD, DM, remote stroke (has been off Aggrenox), paroxysmal SVT who presented with SOB and pleuritic chest pain noted to be in Afib RVR and CHF exacerbation.  Assessment & Plan    New onset Afib RVR - Patient converted to NSR/sinus brady overnight with rates 50-60s - CHADSVASC at least 8. started on Eliquis 2.5 mg BID - Tele shows brief episodes of heart block vs ?CHB with rates in the 30s. Will decrease BB. Will review with MD  Acute combined CHF - BNP 2650 and CXR consistent with CHF - started on IV lasix 40mg  BID - Echo showed EF <20%, global hypokinesis, G3DD, LA severely dilated, mild MR, mild to mod AS. Echo in 8/20202 EF was 45-50% - I/Os not complete. Unable to complete due to patient noncompliance/confusion - weight down 2 lbs since admission - Pressures stable - creatinine 1.02 > 1.19. AM labs pending - CTA showed large bilateral pleural effusions. No lower leg edema on exam  Cardiomyopathy - EF <20%  this admission. EF in August was 45-50% - tachy-mediated vs ischemic etiology - Patient denies CP. Would not consider further ischemic work-up given worsening AMS and recent cath showing patent stent LAD with mod restenosis and mod left main and L cx disease and non obstructive disease. Will discuss with MD  Acute respiratory syndrome - In the setting of Afib and acute CHF  - Ddimer elevated - CTA chest negative for PE, positive for large bilateral pleural effusions, possible multifocal PNA.   CAP - per CTA chest - Patient is afebrile. No leukocytosis. procal wnl.  - Discussed with pharmacy>>will start Rocephin and azithromycin  Heart block - Seen on telemetry overnight brief episodes of ?CHB with rates in the 30s - Decrease BB as above - continue telemetry  Elevated troponin with hx mild to moderate CAD by cath 02/2019 - In the setting of Afib RVR, acute CHF, and possible PNA - HS troponin trend flat. EKG showed ST/Twave change laterally. Patient did have pleuritic CP on admission - Likely demand ischemia  CAD  - Recent cath showed 50% left main, 40% ostial LAD, sequential 40% then 50% left circumflex stenosis. Medical therapy was recommended - continue aspirin, statin, BB  Confusion - Nurse feel the patient is getting worse. There is likely some baseline dementia but unsure of degree - Avoid Ambien - Respiratory panel negative - CTA with possible multifocal PNA - Internal med consulted>>h/o of hallucinations and psych was consulted but were unable to perform interview due to patient AMS - MRI brain>>small acute infarct in the anterior left frontal white matter. No acute neurologic deficits on exam. ?Consult neurology  HLD - continue statin  DM - SSI  For questions or updates, please contact [facility] Please consult [email or web address] for contact info under        Signed, Cadence Ninfa Meeker, PA-C  09/01/2019, 10:23 AM    Attending Note:   The patient was seen and  examined.  Agree with assessment and plan as noted above.  Changes made to the above note as needed.  Patient seen and independently examined with Cadence Furth.   We discussed all aspects of the encounter. I agree with the assessment and plan as stated above.  1.   CHF :  Echo shows an EF of < 20%.   Also has Afib with diastolic dysfunction.  She has diuresed.  Its difficult to  tell about her breathing because she has had significant neuro status changes   2.   Atrial fib:   New diagnosis.  PAF . Is in NSR this am  3.   CAP:   CTA showed multifocal pneumonia.   Abx have been started.   4.   New CVA :   MRI of the brain was technically poor quality because of patient movement but did reveal a new left anterior frontal   stroke.   This is likely a major contributor to her mental status changes.   5. Bradycardia:   HR has been slow at times.   She is on low dose toprol - will hold the Toprol for now  6.  Failure to thrive:   Pt has had a marked decrease in function for the past several weeks.  She has new CHF with EF 20% New Atrial fib New left anterior frontal CVA I have placed an order to palliative care.    I have spent a total of 40 minutes with patient reviewing hospital  notes , telemetry, EKGs, labs and examining patient as well as establishing an assessment and plan that was discussed with the patient. > 50% of time was spent in direct patient care.    Thayer Headings, Brooke Bonito., MD, [facility] 09/01/2019, 12:02 PM [address]  [place] Pager [phone]

## 2019-09-01 NOTE — Progress Notes (Signed)
Patient became lethargic upon rounding and did a sternal rub to wake her. Called rapid response to assess her condition Patient is alert and orient to self and complains of no pain. MD has been notified and is aware of her decline in condition and of her critical lab values. MD relays he spoke to the family, changed the code status, and comfort measures are now in place.

## 2019-09-01 NOTE — Progress Notes (Signed)
This chaplain checked on consult for spiritual care by phone.  The Pt. RN-Sadler shared no needs at this time.  F/U on Thursday morning will be appreciated.

## 2019-09-01 NOTE — Consult Note (Addendum)
Consultation Note Date: 09/01/2019   Patient Name: Stephanie [name]  DOB: [birth year]  MRN: [id number]  Age / Sex: 86 y.o., female  PCP: Tower, Wynelle Fanny, MD Referring Physician: Thayer Headings, MD  Reason for Consultation: Terminal Care  HPI/Patient Profile:  Stephanie [name] is a 86 y.o. female with medical history significant of hypertension, hyperlipidemia, CAD, PSVT, CVA in 02/2019, carotid artery disease, and DM type II who was admitted with shortness of breath.  She was found to have new onset A. fib with RVR with CHF exacerbation and admitted to the cardiology service.  Overnight patient was noted to be confused and only oriented to self.  The patient's husband who is present at bedside helps offer additional history.  He reports that she has been having progressively worsening confusion since Christmas.  She has been seeing people that have not been there for 3 weeks now.  Patient has not been sleeping at night, but then sleeps throughout the day.  S  Failure to thrive, acute CHF, new CVA, atrial fib, malnutrition  Clinical Assessment and Goals of Care: I have reviewed medical records including EPIC notes, labs and imaging, received report from bedside RN, assessed the patient who was notably agitated at that time. She had her legs on the side of the bed, appears quite disoriented.     I met with Blanchard Kelch at bedside to further discuss diagnosis prognosis, GOC, EOL wishes, disposition and options.   I introduced Palliative Medicine as specialized medical care for people living with serious illness. It focuses on providing relief from the symptoms and stress of a serious illness. The goal is to improve quality of life for both the patient and the family.  Luvenia Heller shared that Stephanie [name] and he had been married for over 21 years. They have three children together. They met while bowling in [place]. Stephanie [name]  was born in [place]. though traveled to [place] in her younger years. She and her husband lived there for ten years after meeting. 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 She has a Dachshund named Stephanie [name] who she adores. She is not a member of any local church.   A detailed discussion was had today regarding advanced directives.    Concepts specific to code status, artifical feeding and hydration, continued IV antibiotics and rehospitalization was had.  The difference between a aggressive medical intervention path  and a palliative comfort care path for this patient at this time was had. Values and goals of care important to patient and family were attempted to be elicited.   Family does not want Stephanie Mastrogiovanni to suffer. They have decided to focus on symptom relief through medications and excellent nursing care.   We reviewed comfort medications inclusive of  benzodiazepines, opioids, anticholinergics, antipyretics, antipsychotics, and laxatives.   We talked about the various stages that patients go through leading up to their final moments and discussed how timing can sometimes be unpredictable. Family vocalized understanding and stated that they would just like for the patient to be comfortable.   Discussed with patient the importance of continued conversation with family and their  medical providers regarding overall plan of care and treatment options, ensuring decisions are within the context of the patients values and GOCs.  Decision Maker: Stephanie [name] (Spouse) [phone]  SUMMARY OF RECOMMENDATIONS   DNR/DNI  Comfort Measures, symtom medications as below  Discontinue all nonessential medications  Code Status/Advance Care Planning:  DNR   Comfort Measures  Symptom  Management:  Anticipate in hospital death. Projected time frame within hours to days.  Dyspnea:  - Dilaudid gtt  PRN  - Dilaudid bolus 0.12m IV Q30M PRN Pain:  - Tylenol 6520mPO Q4H PRN  - Tylenol 32573mR Q4H PRN  Fever:  - Tylenol as above Agitation:  - Haldol 1-2mg66m Q6H PRN Anxiety:  - Lorazepam 0.5-1mg 55mQ1H PRN Nausea:  - Zofran 4mg I41m6H PRN  Secretions:  - Glycopyrrolate 0.4mg IV14mH ATC Dry Eyes:  - Artificial Tears PRN Xerostomia:  - Biotene 15ml PR54m BID oral care Urinary Retention:  - Bladder Scan QShift, if > 300ml Pla82moley for comfort Constipation:  - Bisacodyl 10mg PR P39mDay Spiritual:  - Chaplain consult  Palliative Prophylaxis:   Aspiration, Bowel Regimen, Delirium Protocol, Eye Care, Frequent Pain Assessment, Oral Care, Palliative Wound Care and Turn Reposition  Additional Recommendations (Limitations, Scope, Preferences):  Full Comfort Care  Psycho-social/Spiritual:   Desire for further Chaplaincy support:yes  Additional Recommendations: Caregiving  Support/Resources and Education on Hospice  Prognosis:   Hours - Days  Discharge Planning: Anticipated Hospital Death     Primary Diagnoses: Present on Admission: . Acute on chronic combined systolic (congestive) and diastolic (congestive) heart failure (HCC) . [place]opathy . Hallucination, visual  I have reviewed the medical record, interviewed the patient and family, and examined the patient. The following aspects are pertinent.  Past Medical History:  Diagnosis Date  . Allergy    allergic rhinitis  . Anxiety   . Arthritis    osteoarthritis  . CAD (coronary artery disease)    a. s/p prior stenting of the LAD and D1 (02/2004); b. 02/2019 NSTEMI in setting of PSVT/Cath: >M 67m/d, LAD43mst/p/m, D1 60, LCX 2m, 50d, R11mnl-->Med Rx.  . Carotid arterial disease (HCC)    a. 8[place]20 Carotid U/S: RICA 40-59, LICA 12-45 Antegr8-09bilat vert flow.  . Degenerative disk  disease    in neck  . Diabetes mellitus    type II  . Fracture of femoral neck, left (HCC) 7/10/20[place] . Hyperlipidemia   . Hypertension   . Hypoaldosteronism (HCC) 6/12   [place] DM causing hyperkalemia   . Hyponatremia    a. 02/2019 in setting of chlorthalidone rx (d/c'd).  . Ischemic cardiomyopathy    a. 11/2016 Echo: EF 55-60%, Gr1 DD; b. 02/2019 Echo: EF 45-50%, sev basal-mid infsept, inf, inflat HK. DD. Sev dil LA.  . Osteopenia   . Periodontal disease   . Peripheral vascular disease (HCC)   . PSVSmootparoxysmal supraventricular tachycardia) (HCC)    a. 8[place]20 - in absence of BB therapy-->broke w/ adenosine.  . Stroke (HCC)    a. [facility]20 MRI: L temporal infarct (1 day following cardiac  cath).  . Transient cerebral ischemia 11/06/2016   Social History   Socioeconomic History  . Marital status: Married    Spouse name: Luvenia Heller  . Number of children: 3  . Years of education: Not on file  . Highest education level: Not on file  Occupational History  . Occupation: retired   Tobacco Use  . Smoking status: Never Smoker  . Smokeless tobacco: Never Used  Substance and Sexual Activity  . Alcohol use: No    Alcohol/week: 0.0 standard drinks  . Drug use: No  . Sexual activity: Not on file  Other Topics Concern  . Not on file  Social History Narrative   Mrs Brauner is a 86 year old retired female living at home with her husband, Luvenia Heller and her dog. She voices being independent with ADLs and independent/assist with iADLs She has her husband to assist with transportation to medical appointments      Social Determinants of Health   Financial Resource Strain: Low Risk   . Difficulty of Paying Living Expenses: Not hard at all  Food Insecurity: No Food Insecurity  . Worried About Charity fundraiser in the Last Year: Never true  . Ran Out of Food in the Last Year: Never true  Transportation Needs: No Transportation Needs  . Lack of Transportation (Medical): No  . Lack of Transportation  (Non-Medical): No  Physical Activity: Inactive  . Days of Exercise per Week: 0 days  . Minutes of Exercise per Session: 0 min  Stress: No Stress Concern Present  . Feeling of Stress : Not at all  Social Connections: Not Isolated  . Frequency of Communication with Friends and Family: More than three times a week  . Frequency of Social Gatherings with Friends and Family: More than three times a week  . Attends Religious Services: More than 4 times per year  . Active Member of Clubs or Organizations: Yes  . Attends Archivist Meetings: 1 to 4 times per year  . Marital Status: Married   Family History  Problem Relation Age of Onset  . Cancer Mother        vaginal CA in situ  . Hypertension Mother   . Heart disease Mother        CAD and MI  . Heart disease Father        CAD  . Diabetes Father   . Heart disease Son        congenital valve problem   Scheduled Meds: Continuous Infusions: PRN Meds:.acetaminophen, acetaminophen, antiseptic oral rinse, HYDROmorphone (DILAUDID) injection, hydroxypropyl methylcellulose / hypromellose, LORazepam, nitroGLYCERIN, ondansetron (ZOFRAN) IV Medications Prior to Admission:  Prior to Admission medications   Medication Sig Start Date End Date Taking? Authorizing Provider  atorvastatin (LIPITOR) 20 MG tablet Take 1 tablet (20 mg total) by mouth daily. 07/22/19  Yes Tower, Wynelle Fanny, MD  dipyridamole-aspirin (AGGRENOX) 200-25 MG 12hr capsule Take 1 capsule by mouth twice daily Patient taking differently: Take 1 capsule by mouth 2 (two) times daily.  09/24/18  Yes Tower, Wynelle Fanny, MD  famotidine (PEPCID) 40 MG tablet Take 1 tablet (40 mg total) by mouth daily. Patient taking differently: Take 40 mg by mouth See admin instructions. Take 40 mg by mouth one to two times a day 04/28/18  Yes Thornton Park, MD  glipiZIDE (GLUCOTROL XL) 10 MG 24 hr tablet Take 1 tablet by mouth once daily with breakfast Patient taking differently: Take 10 mg by mouth  daily with breakfast.  03/16/19  Yes Tower, Wynelle Fanny, MD  metFORMIN (GLUCOPHAGE) 1000 MG tablet TAKE 1 TABLET BY MOUTH TWICE DAILY WITH MEALS Patient taking differently: Take 1,000 mg by mouth 2 (two) times daily with a meal.  08/18/19  Yes Tower, Wynelle Fanny, MD  metoprolol succinate (TOPROL-XL) 100 MG 24 hr tablet TAKE 1 TABLET BY MOUTH ONCE DAILY TAKE  WITH  OR  IMMEDIATELY  FOLLOWING  A  MEAL Patient taking differently: Take 100 mg by mouth daily. TAKE 1 TABLET BY MOUTH ONCE DAILY TAKE  WITH  OR  IMMEDIATELY  FOLLOWING  A  MEAL 05/20/19  Yes Tower, Wynelle Fanny, MD  nitroGLYCERIN (NITROSTAT) 0.4 MG SL tablet Place 1 tablet (0.4 mg total) under the tongue every 5 (five) minutes x 3 doses as needed for chest pain. 02/17/19  Yes Sande Rives E, PA-C  PARoxetine (PAXIL) 40 MG tablet Take 1 tablet (40 mg total) by mouth at bedtime. 03/26/19  Yes Tower, Wynelle Fanny, MD  VITAMIN D PO Take 1 tablet by mouth in the morning, at noon, and at bedtime.   Yes [provider]  VITAMIN K PO Take 1 tablet by mouth daily.   Yes [provider]  cephALEXin (KEFLEX) 500 MG capsule Take 1 capsule (500 mg total) by mouth 2 (two) times daily. Patient not taking: Reported on 08/18/2019 07/23/19   Abner Greenspan, MD   Allergies  Allergen Reactions  . Ace Inhibitors Other (See Comments)    Chest pain  . Amlodipine Besy-Benazepril Hcl Other (See Comments)    Chest pain  . Clopidogrel Bisulfate Other (See Comments)    GI upset  . Erythromycin Other (See Comments)    GI upset  . Pneumovax 23 [Pneumococcal Vac Polyvalent]     Local reaction   . Tape Other (See Comments)    Causes soreness (of the skin)   Review of Systems  Unable to perform ROS  Physical Exam Vitals and nursing note reviewed.  Constitutional:      Appearance: She is ill-appearing and toxic-appearing.  HENT:     Head: Normocephalic.  Cardiovascular:     Rate and Rhythm: Normal rate and regular rhythm.     Heart sounds: Normal heart sounds.    Pulmonary:     Effort: Pulmonary effort is normal.     Breath sounds: Normal breath sounds.  Abdominal:     Palpations: Abdomen is soft.  Musculoskeletal:     Cervical back: Normal range of motion.  Skin:    General: Skin is dry.     Capillary Refill: Capillary refill takes 2 to 3 seconds.     Coloration: Skin is pale.  Neurological:     Comments: Disoriented  Psychiatric:        Mood and Affect: Mood is anxious.        Behavior: Behavior is agitated.    Vital Signs: BP 125/78 (BP Location: Right Arm)   Pulse (!) 59   Temp 97.8 F (36.6 C) (Oral)   Resp 20   Ht 5' (1.524 m)   Wt 47.9 kg Comment: scale a  SpO2 99%   BMI 20.62 kg/m  Pain Scale: 0-10   Pain Score: 0-No pain  SpO2: SpO2: 99 % O2 Device:SpO2: 99 % O2 Flow Rate: .O2 Flow Rate (L/min): 0 L/min  IO: Intake/output summary:   Intake/Output Summary (Last 24 hours) at 09/01/2019 1604 Last data filed at 09/01/2019 1300 Gross per 24 hour  Intake 360 ml  Output 101 ml  Net  259 ml   LBM: Last BM Date: 09/01/19 Baseline Weight: Weight: 48.9 kg(scale a) Most recent weight: Weight: 47.9 kg(scale a)     Palliative Assessment/Data: 10%   Time In: 1520 Time Out: 1630 Time Total: 70 Greater than 50%  of this time was spent counseling and coordinating care related to the above assessment and plan.  Signed by: Rosezella Rumpf, NP   Please contact Palliative Medicine Team phone at [phone] for questions and concerns.  For individual provider: See Shea Evans

## 2019-09-01 NOTE — Plan of Care (Signed)
  Problem: Education: Goal: Knowledge of General Education information will improve Description: Including pain rating scale, medication(s)/side effects and non-pharmacologic comfort measures Outcome: Not Progressing   Problem: Health Behavior/Discharge Planning: Goal: Ability to manage health-related needs will improve Outcome: Not Progressing   Problem: Clinical Measurements: Goal: Ability to maintain clinical measurements within normal limits will improve Outcome: Not Progressing Goal: Will remain free from infection Outcome: Not Progressing Goal: Diagnostic test results will improve Outcome: Not Progressing Goal: Respiratory complications will improve Outcome: Not Progressing Goal: Cardiovascular complication will be avoided Outcome: Not Progressing   Problem: Activity: Goal: Risk for activity intolerance will decrease Outcome: Not Progressing   Problem: Nutrition: Goal: Adequate nutrition will be maintained Outcome: Not Progressing   Problem: Coping: Goal: Level of anxiety will decrease Outcome: Not Progressing   Problem: Elimination: Goal: Will not experience complications related to bowel motility Outcome: Not Progressing Goal: Will not experience complications related to urinary retention Outcome: Not Progressing   Problem: Pain Managment: Goal: General experience of comfort will improve Outcome: Not Progressing   Problem: Safety: Goal: Ability to remain free from injury will improve Outcome: Not Progressing   Problem: Skin Integrity: Goal: Risk for impaired skin integrity will decrease Outcome: Not Progressing   Problem: Education: Goal: Ability to demonstrate management of disease process will improve Outcome: Not Progressing Goal: Ability to verbalize understanding of medication therapies will improve Outcome: Not Progressing Goal: Individualized Educational Video(s) Outcome: Not Progressing

## 2019-09-01 NOTE — Progress Notes (Signed)
Patient is alert and oriented to self. Patient has refused breakfast and lunch trays. She refused her afternoon meds, MD paged and notified.

## 2019-09-01 NOTE — Significant Event (Addendum)
Rapid Response Event Note  Overview: Time Called: 1244 Arrival Time: 1250 Event Type: Other (Comment)(Change in mentation, critical lab values.)  Initial Focused Assessment: Pt lying in bed, eyes open. PERRLA, 63mm, sluggish response bilaterally. Pt oriented to self and year, disoriented to location and situation. Pt able to move all extremities, intermittently follows commands. Pt withdrawals from pain in all four extremities. SBP 140, HR 57, RR 18, SpO2 92% on room air. Pt placed on 2LNC. Pt cool to touch, unable to get oral temperature. Lung sounds are clear, diminished. Bowel sounds are faint and abdomen is soft. Pt stated her chest hurt, EKG checked. Pt asked again and stated she had no chest pain.   Per primary RN, pt was communicating yesterday and getting up out of bed. Today pt has been bed bound and providing minimal communication.   La 8.1, Na 133, K 5.7, BUN 35, Cr 2.61  Interventions: 2LNC placed CBG 120 EKG completed  Plan of Care (if not transferred): I spoke with Dr. Acie Fredrickson who informed me he had spoken to pt's family and pt's code status changed to DNR. Decision made to transition pt to comfort care. Dr. Acie Fredrickson consulted palliative care.   Event Summary: Name of Physician Notified: Dr. Acie Fredrickson at 1315    at    Outcome: Code status clarified, Other (Comment)(Pt transitioned to comfort care, pt code status updated to DNR)  Event End Time: [place]

## 2019-09-01 NOTE — Progress Notes (Signed)
Nutrition Brief Note  Chart reviewed. Pt now transitioning to comfort care.  No further nutrition interventions warranted at this time.  Please re-consult as needed.   Rajinder Mesick W, RD, LDN, CDCES Registered Dietitian II Certified Diabetes Care and Education Specialist Please refer to [facility] for RD and/or RD on-call/weekend/after hours pager  

## 2019-09-01 NOTE — Progress Notes (Addendum)
CBG: 49 at 0620. Pt alert and able to swallow PO. 8oz of juice given per hypoglycemia order. CBG: 69 at 0645. 8oz of juice given once again. CBG: 121 at 0720.

## 2019-09-02 DIAGNOSIS — Z66 Do not resuscitate: Secondary | ICD-10-CM

## 2019-09-02 DIAGNOSIS — Z515 Encounter for palliative care: Secondary | ICD-10-CM

## 2019-09-02 LAB — BASIC METABOLIC PANEL
Anion gap: 18 — ABNORMAL HIGH (ref 5–15)
BUN: 35 mg/dL — ABNORMAL HIGH (ref 8–23)
CO2: 12 mmol/L — ABNORMAL LOW (ref 22–32)
Calcium: 9.2 mg/dL (ref 8.9–10.3)
Chloride: 103 mmol/L (ref 98–111)
Creatinine, Ser: 2.61 mg/dL — ABNORMAL HIGH (ref 0.44–1.00)
GFR calc Af Amer: 19 mL/min — ABNORMAL LOW (ref >60)
GFR calc non Af Amer: 16 mL/min — ABNORMAL LOW (ref >60)
Glucose, Bld: 193 mg/dL — ABNORMAL HIGH (ref 70–99)
Potassium: 5.7 mmol/L — ABNORMAL HIGH (ref 3.5–5.1)
Sodium: 133 mmol/L — ABNORMAL LOW (ref 135–145)

## 2019-09-02 NOTE — Plan of Care (Signed)
  Problem: Nutrition: Goal: Adequate nutrition will be maintained Outcome: Not Progressing   Problem: Elimination: Goal: Will not experience complications related to bowel motility Outcome: Not Progressing Goal: Will not experience complications related to urinary retention Outcome: Not Progressing   Problem: Education: Goal: Ability to demonstrate management of disease process will improve Outcome: Not Progressing Goal: Ability to verbalize understanding of medication therapies will improve Outcome: Not Progressing Goal: Individualized Educational Video(s) Outcome: Not Progressing

## 2019-09-02 NOTE — Progress Notes (Addendum)
Progress Note  Patient Name: Stephanie [name] Date of Encounter: 09/02/2019  Primary Cardiologist: Ida Rogue, MD   Subjective   Patient is now full comfort care and DNR. Palliative on board.   Patient appears comfortable on 2L O2. Unresponsive to me.   Inpatient Medications    Scheduled Meds:  Continuous Infusions: . HYDROmorphone 0.5 mg/hr (09/01/19 1843)   PRN Meds: acetaminophen, acetaminophen, antiseptic oral rinse, bisacodyl, glycopyrrolate, haloperidol lactate, HYDROmorphone, HYDROmorphone (DILAUDID) injection, hydroxypropyl methylcellulose / hypromellose, LORazepam, ondansetron (ZOFRAN) IV   Vital Signs    Vitals:   09/01/19 0901 09/01/19 1203 09/01/19 1354 09/02/19 0632  BP: 125/66 128/68 125/78 101/61  Pulse: (!) 55 60 (!) 59 (!) 53  Resp: _0 Temp: (!) 97.3 F (36.3 C) 97.8 F (36.6 C)  (!) 97.5 F (36.4 C)  TempSrc: Oral Oral  Oral  SpO2: 100% 100% 99% 95%  Weight:      Height:        Intake/Output Summary (Last 24 hours) at 09/02/2019 1026 Last data filed at 09/02/2019 0810 Gross per 24 hour  Intake 9.28 ml  Output 1 ml  Net 8.28 ml   Last 3 Weights 09/01/2019 08/31/2019 08/28/2019  Weight (lbs) 105 lb 9.6 oz 106 lb 14.4 oz 107 lb 14.4 oz  Weight (kg) 47.9 kg 48.49 kg 48.943 kg      Telemetry    NSR/sinus bradycardia - Personally Reviewed  ECG    No new - Personally Reviewed  Physical Exam   GEN: No acute distress.;2L O2 Neck: No JVD Cardiac: RRR, no murmurs, rubs, or gallops Respiratory: rhonchi GI: Soft, nontender, non-distended  MS: No edema; No deformity. Neuro:  Nonfocal  Psych: Unresposive  Labs    High Sensitivity Troponin:   Recent Labs  Lab 08/19/2019 1645 09/11/2019 1830 09/10/2019 2049 09/07/2019 2240  TROPONINIHS 70* 74* 76* 81*      Chemistry Recent Labs  Lab 08/19/2019 1645 08/31/19 0535 09/01/19 1053  NA 133* 134* 133*  K 4.3 4.6 5.7*  CL 102 101 103  CO2 20* 20* 12*  GLUCOSE 244* 300* 193*  BUN  14 18 35*  CREATININE 1.02* 1.19* 2.61*  CALCIUM 9.3 9.3 9.2  PROT 6.1*  --   --   ALBUMIN 2.9*  --   --   AST 22  --   --   ALT 13  --   --   ALKPHOS 79  --   --   BILITOT 0.7  --   --   GFRNONAA 51* 42* 16*  GFRAA 59* 49* 19*  ANIONGAP 11 13 18*     Hematology Recent Labs  Lab 08/21/2019 1645  WBC 9.7  RBC 3.85*  HGB 11.0*  HCT 35.4*  MCV 91.9  MCH 28.6  MCHC 31.1  RDW 13.4  PLT 343    BNP Recent Labs  Lab 08/16/2019 1645  BNP 2,650.2*     DDimer  Recent Labs  Lab 08/23/2019 2049  DDIMER 1.67*     Radiology    CT ANGIO CHEST PE W OR WO CONTRAST  Result Date: 08/31/2019 CLINICAL DATA:  Shortness of breath and elevated D-dimer EXAM: CT ANGIOGRAPHY CHEST WITH CONTRAST TECHNIQUE: Multidetector CT imaging of the chest was performed using the standard protocol during bolus administration of intravenous contrast. Multiplanar CT image reconstructions and MIPs were obtained to evaluate the vascular anatomy. CONTRAST:  15m OMNIPAQUE IOHEXOL 350 MG/ML SOLN COMPARISON:  Chest x-ray from previous day. FINDINGS: Cardiovascular:  Atherosclerotic calcifications thoracic aorta noted. No aneurysmal dilatation is seen. Opacification is decreased limiting evaluation for dissection. The pulmonary artery shows a normal branching pattern. No filling defect to suggest pulmonary embolus is noted. Coronary calcifications are noted. Cardiac enlargement is noted. Mediastinum/Nodes: Thoracic inlet is within normal limits. No hilar or mediastinal adenopathy is noted. The esophagus as visualized is within normal limits. Lungs/Pleura: Large bilateral pleural effusions are noted. Patchy multifocal infiltrates are noted within the upper lobes bilaterally as well as lower lobe consolidation. These changes are consistent with multifocal pneumonia. Upper Abdomen: Visualized upper abdomen demonstrates heavily calcified splenic artery aneurysm measuring approximately 2.1 cm. This is roughly stable when compared  with prior CTs of the abdomen. Musculoskeletal: Degenerative changes of the thoracic spine are noted. Review of the MIP images confirms the above findings. IMPRESSION: Large bilateral pleural effusions with associated lower lobe consolidation. Multifocal infiltrates in the upper lobes are noted bilaterally. These changes are likely infectious in etiology. No evidence of pulmonary emboli. Stable calcified splenic artery aneurysm. Electronically Signed   By: Inez Catalina M.D.   On: 08/31/2019 14:20   MR BRAIN WO CONTRAST  Result Date: 09/01/2019 CLINICAL DATA:  Encephalopathy EXAM: MRI HEAD WITHOUT CONTRAST TECHNIQUE: Multiplanar, multiecho pulse sequences of the brain and surrounding structures were obtained without intravenous contrast. COMPARISON:  Brain MRI 02/17/2019 FINDINGS: Significantly motion degraded study which was aborted early. Only diffusion imaging was acquired (axial and coronal) and is positive for a linear infarct in the left anterior frontal white matter. No hydrocephalus or shift. IMPRESSION: 1. Truncated and motion degraded study, only diffusion imaging was acquired. 2. Small acute infarct in the anterior left frontal white matter. Electronically Signed   By: Monte Fantasia M.D.   On: 09/01/2019 06:32   ECHOCARDIOGRAM COMPLETE  Result Date: 08/31/2019    ECHOCARDIOGRAM REPORT   Patient Name:   Stephanie [name] Date of Exam: 08/31/2019 Medical Rec #:  [id number]      Height:       60.0 in Accession #:    [id number]     Weight:       106.9 lb Date of Birth:  [birth year]      BSA:          1.43 m Patient Age:    86 years       BP:           144/97 mmHg Patient Gender: F              HR:           87 bpm. Exam Location:  Inpatient Procedure: 2D Echo, Cardiac Doppler and Color Doppler Indications:    Chest pain  History:        Patient has prior history of Echocardiogram examinations, most                 recent 02/16/2019. CHF, CAD, Stroke, Arrythmias:Atrial                 Fibrillation,  Signs/Symptoms:Shortness of Breath and Altered                 Mental Status; Risk Factors:Hypertension, Dyslipidemia and                 Diabetes.  Sonographer:    Dustin Flock Referring Phys: [phone] HAO MENG IMPRESSIONS  1. Left ventricular ejection fraction, by estimation, is <20%. The left ventricle has severely decreased function. The left ventricle demonstrates global hypokinesis. Left ventricular  diastolic parameters are consistent with Grade III diastolic dysfunction (restrictive).  2. Right ventricular systolic function is normal. The right ventricular size is normal. There is moderately elevated pulmonary artery systolic pressure.  3. Left atrial size was severely dilated.  4. The mitral valve is normal in structure and function. Mild mitral valve regurgitation. No evidence of mitral stenosis.  5. Tricuspid valve regurgitation is moderate.  6. The aortic valve is normal in structure and function. Aortic valve regurgitation is not visualized. Mild to moderate aortic valve sclerosis/calcification is present, without any evidence of aortic stenosis.  7. The inferior vena cava is dilated in size with >50% respiratory variability, suggesting right atrial pressure of 8 mmHg. Comparison(s): The left ventricular function is worsened. FINDINGS  Left Ventricle: Left ventricular ejection fraction, by estimation, is <20%. The left ventricle has severely decreased function. The left ventricle demonstrates global hypokinesis. The left ventricular internal cavity size was normal in size. There is no  left ventricular hypertrophy. Left ventricular diastolic parameters are consistent with Grade III diastolic dysfunction (restrictive). Right Ventricle: The right ventricular size is normal. No increase in right ventricular wall thickness. Right ventricular systolic function is normal. There is moderately elevated pulmonary artery systolic pressure. The tricuspid regurgitant velocity is 3.37 m/s, and with an assumed  right atrial pressure of 8 mmHg, the estimated right ventricular systolic pressure is 24.0 mmHg. Left Atrium: Left atrial size was severely dilated. Right Atrium: Right atrial size was normal in size. Pericardium: There is no evidence of pericardial effusion. Mitral Valve: The mitral valve is normal in structure and function. Normal mobility of the mitral valve leaflets. Mild mitral valve regurgitation. No evidence of mitral valve stenosis. Tricuspid Valve: The tricuspid valve is normal in structure. Tricuspid valve regurgitation is moderate . No evidence of tricuspid stenosis. Aortic Valve: The aortic valve is normal in structure and function.. There is moderate thickening and moderate calcification of the aortic valve. Aortic valve regurgitation is not visualized. Mild to moderate aortic valve sclerosis/calcification is present, without any evidence of aortic stenosis. There is moderate thickening of the aortic valve. There is moderate calcification of the aortic valve. Pulmonic Valve: The pulmonic valve was normal in structure. Pulmonic valve regurgitation is mild to moderate. No evidence of pulmonic stenosis. Aorta: The aortic root is normal in size and structure. Venous: The inferior vena cava is dilated in size with greater than 50% respiratory variability, suggesting right atrial pressure of 8 mmHg. IAS/Shunts: No atrial level shunt detected by color flow Doppler.  LEFT VENTRICLE PLAX 2D LVIDd:         4.31 cm  Diastology LVIDs:         3.91 cm  LV e' lateral:   6.31 cm/s LV PW:         0.87 cm  LV E/e' lateral: 19.3 LV IVS:        0.87 cm  LV e' medial:    4.35 cm/s LVOT diam:     1.70 cm  LV E/e' medial:  28.0 LV SV:         31.78 ml LV SV Index:   11.99 LVOT Area:     2.27 cm  RIGHT VENTRICLE RV Basal diam:  2.33 cm RV S prime:     6.31 cm/s TAPSE (M-mode): 1.7 cm LEFT ATRIUM             Index       RIGHT ATRIUM           Index  LA diam:        4.70 cm 3.29 cm/m  RA Area:     16.80 cm LA Vol (A2C):    93.0 ml 65.02 ml/m RA Volume:   48.60 ml  33.98 ml/m LA Vol (A4C):   80.7 ml 56.42 ml/m LA Biplane Vol: 91.4 ml 63.90 ml/m  AORTIC VALVE LVOT Vmax:   74.60 cm/s LVOT Vmean:  47.800 cm/s LVOT VTI:    0.140 m  AORTA Ao Root diam: 2.80 cm MITRAL VALVE                TRICUSPID VALVE MV Area (PHT): 4.31 cm     TR Peak grad:   45.4 mmHg MV Decel Time: 176 msec     TR Vmax:        337.00 cm/s MV E velocity: 122.00 cm/s MV A velocity: 24.50 cm/s   SHUNTS MV E/A ratio:  4.98         Systemic VTI:  0.14 m                             Systemic Diam: 1.70 cm Candee Furbish MD Electronically signed by Candee Furbish MD Signature Date/Time: 08/31/2019/1:27:42 PM    Final     Cardiac Studies   Echo 08/31/19 1. Left ventricular ejection fraction, by estimation, is <20%. The left  ventricle has severely decreased function. The left ventricle demonstrates  global hypokinesis. Left ventricular diastolic parameters are consistent  with Grade III diastolic  dysfunction (restrictive).  2. Right ventricular systolic function is normal. The right ventricular  size is normal. There is moderately elevated pulmonary artery systolic  pressure.  3. Left atrial size was severely dilated.  4. The mitral valve is normal in structure and function. Mild mitral  valve regurgitation. No evidence of mitral stenosis.  5. Tricuspid valve regurgitation is moderate.  6. The aortic valve is normal in structure and function. Aortic valve  regurgitation is not visualized. Mild to moderate aortic valve  sclerosis/calcification is present, without any evidence of aortic  stenosis.  7. The inferior vena cava is dilated in size with >50% respiratory  variability, suggesting right atrial pressure of 8 mmHg.   Patient Profile     86 y.o. female  with hx of medially managed CAD, carotid artery disease, HTN, HLD, DM, remote stroke (has been off Aggrenox), paroxysmal SVT who presented with SOB and pleuritic chest pain noted to be in Afib  RVR and CHF exacerbation found to have small acute stroke, FE <29, metabolic acidosis, CAP,  FFT who is not on comfort care.   Assessment & Plan    Goals of care - Patients condition continued to deteriorate with severe CHF, CAP, small CVA, AKI, metabolic acidosis and palliative was consulted - Palliative met with family and plan for full comfort care - Patiet is now DNR - All nonessential medications discontinued - Anticipate hospital death - Management per palliative  New onset Afib RVR  - Patient remains in NSR/sinus brady - Eliquis stopped  Acute combined CHF/Cardiomyopathy - Echo showed EF <20%, global hypokinesis, G3DD, LA severely dilated, mild MR, mild to mod AS. Echo in 8/20202 EF was 45-50% - All meds stopped  Heart block - None seen overnight - BB stopped   For questions or updates, please contact [facility] Please consult [email or web address] for contact info under        Signed, Cadence Ninfa Meeker, PA-C  09/02/2019, 10:26  AM    Attending Note:   The patient was seen and examined.  Agree with assessment and plan as noted above.  Changes made to the above note as needed.  Patient seen and independently examined with  Cadence Kathlen Mody, PA .   We discussed all aspects of the encounter. I agree with the assessment and plan as stated above.  1.  CHF:   Has severely reduced EF .   Acute on combined systolic / diastolic CHF/  Has marked reduction in perfusion resulting in lactic acidosis   2.   New left frontal CVA 3.  New onset paroxysmal  atrial fib:   In sinus rhythm  4.   I discussed the situation with the family yesterday . Her prognosis is poor  She has been made comfort care /DNR. Is on a morphine drip     I have spent a total of 40 minutes with patient reviewing hospital  notes , telemetry, EKGs, labs and examining patient as well as establishing an assessment and plan that was discussed with the patient. > 50% of time was spent in direct patient  care.    Thayer Headings, Brooke Bonito., MD, [facility] 09/02/2019, 10:55 AM [address],  [place] Pager [phone]

## 2019-09-02 NOTE — Progress Notes (Signed)
No charge note  Chart Reviewed, patient is on full comfort measures, cardiology and palliative care continue to follow. Hospitalist will sign off, will be available as needed.

## 2019-09-02 NOTE — Progress Notes (Signed)
Visited with Stephanie [name] as result of referral from night Chaplain.  Speaking with her nurse prior to entering the room I learned Stephanie [name] would likely not know I was there.  Nurse and I agreed prayer would be ok.  Spoke Stephanie [name]'s name upon entering room and at her bedside, but she gave no indication of knowing I was there.  Offered prayer aloud at bedside.  De Burrs Chaplain Resident
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
## 2019-09-02 NOTE — Progress Notes (Signed)
Patient has been asleep through out the day. Patient is unresponsive, only mumbles when she hears a voice however will not respond back. Patient's son Merry Proud, daughter in law, and husband is at the bedside.

## 2019-09-03 ENCOUNTER — Ambulatory Visit: Payer: Medicare Other | Admitting: Family Medicine

## 2019-09-03 DIAGNOSIS — I4891 Unspecified atrial fibrillation: Secondary | ICD-10-CM

## 2019-09-03 DIAGNOSIS — I509 Heart failure, unspecified: Secondary | ICD-10-CM

## 2019-09-03 MED ORDER — GLYCOPYRROLATE 0.2 MG/ML IJ SOLN
0.4000 mg | INTRAMUSCULAR | Status: DC
  Administered 2019-09-03 (×2): 0.4 mg via INTRAVENOUS
  Filled 2019-09-03 (×6): qty 2

## 2019-09-10 ENCOUNTER — Ambulatory Visit: Payer: Medicare Other | Admitting: Family Medicine

## 2019-09-13 NOTE — Progress Notes (Signed)
Palliative Medicine Inpatient Follow Up Note   HPI: Stephanie [name] a 86 y.o.femalewith medical history significant ofhypertension, hyperlipidemia,CAD, PSVT, CVA in 02/2019, carotid artery disease,andDM type II who was admitted with shortness of breath.She was found to have new onset A. fib with RVR with CHF exacerbation and admitted to the cardiology service. Overnight patient was noted to be confused and only oriented to self. The patient's husband who is present at bedside helps offer additional history. He reports that she has been having progressively worsening confusion since Christmas. She has been seeing people that have not been there for 3 weeks now. Patient has not been sleeping at night, but then sleeps throughout the day. S  Failure to thrive, acute CHF, new CVA, atrial fib, malnutrition  On 2/17 was transitioned to full comfort measures.  Today's Discussion (September 26, 2019): Chart reviewed. Stopped in at bedside for a symptom evaluation. Miss Mckeague had increased gargled/rattled sounds --> glycopyrrolate changed to around the clock. Lower extremities are mottled and cool to the touch. She is in the active phases of the dying process.   No family present at bedside during assessment.   We will continue to follow and offer symptomatic support.   Vital Signs Vitals:   09/02/19 1701 2019/09/26 0414  BP: 118/69 (!) 100/49  Pulse: (!) 57 66  Resp: 18 12  Temp:  98.5 F (36.9 C)  SpO2: 96% 90%    Intake/Output Summary (Last 24 hours) at September 26, 2019 0740 Last data filed at 09-26-2019 0415 Gross per 24 hour  Intake 11.45 ml  Output 0 ml  Net 11.45 ml   Last Weight  Most recent update: 09/01/2019  4:53 AM   Weight  47.9 kg (105 lb 9.6 oz)           Physical Exam Vitals and nursing note reviewed.  Constitutional:      Appearance: She is ill-appearing and toxic-appearing.  HENT:     Head: Normocephalic.  Cardiovascular:     Rate and Rhythm: Normal rate and  regular rhythm.     Heart sounds: Normal heart sounds.  Pulmonary:     Effort: Pulmonary effort is normal.     Breath sounds: Normal breath sounds.  Abdominal:     Palpations: Abdomen is soft.  Musculoskeletal:     Cervical back: Normal range of motion.  Skin:    General: Skin is beginning to mottle all extremities are cool to the touch    Coloration: Skin is pale.  Neurological:     Comments: Disoriented  Psychiatric:        Mood and Affect: Mood is calm, somnolent  Symptom Management:  Anticipate in hospital death. Projected time frame within hours to days.  Dyspnea:                 - Dilaudid gtt  PRN                 - Dilaudid bolus 0.5mg  IV Q30M PRN Pain:                 - Tylenol 650mg  PO Q4H PRN                 - Tylenol 325mg  PR Q4H PRN  Fever:                 - Tylenol as above Agitation:                 - Haldol 1-2mg  IV Q6H  PRN Anxiety:                 - Lorazepam 0.5-1mg  IV Q1H PRN Nausea:                 - Zofran 4mg  IV Q6H PRN    Secretions:                 - Glycopyrrolate 0.4mg  IV Q4H ATC Dry Eyes:                 - Artificial Tears PRN Xerostomia:                 - Biotene 77ml PRN                 - BID oral care Urinary Retention:                 - Bladder Scan QShift, if > 337ml Place foley for comfort Constipation:                 - Bisacodyl 10mg  PR PRN QDay Spiritual:                 - Chaplain consult  Time Spent: 15 Greater than 50% of the time was spent in counseling and coordination of care ______________________________________________________________________________________ [facility] Team Team Cell Phone: [phone] Please utilize secure chat with additional questions, if there is no response within 30 minutes please call the above phone number  Palliative Medicine Team providers are available by phone from 7am to 7pm daily and can be reached through the team cell phone.  Should this  patient require assistance outside of these hours, please call the patient's attending physician.

## 2019-09-13 NOTE — Progress Notes (Signed)
   Pt is on comfort care. Will DC bladder scans  Appreciate the assistance of Dr. Erlinda Hong and Tacey Ruiz, NP   She will remain on morphine drip.  Talked with her husband who was in the room Her son from [place] will be traveling to [facility] to visit today     Mertie Moores, MD  2019-09-21 10:07 AM    [address],  [place], [place]  [address] Phone: [phone]; Fax: [phone]

## 2019-09-13 NOTE — Progress Notes (Signed)
Chaplain engaged in initial visit with Stephanie [name] and her husband, Stephanie [name].  Chaplain offered support.  Stephanie [name] shared stories with chaplain about his wife Stephanie [name] and their life together.  Chaplain served as Estate manager/land agent and spiritual presence.   Chaplain is available to offer support throughout the day as Stephanie [name]'s family arrives.

## 2019-09-13 NOTE — Progress Notes (Signed)
This RN was called by  NT to the pts room. Went in the pts room with another Therapist, sports.Pt. has no pulse, not breathing, asystole on the monitor confirmed  by CCMD.  Pt. Pronounced dead by 2 RNs @ 1712. Angelica Ran NP made aware of death. Post mortem care done.Family was called. Son came to get pts. Belonging. Dilaudid drip wasted witness by B. Wynetta Emery Rn.

## 2019-09-13 NOTE — Consult Note (Signed)
   [facility] CM Inpatient Consult   2019/09/11  ITALEE VELES 08/10/1933 [id number]  Chart reviewed and reveals the patient is currently transitioning on full comfort care.  Patie.nt in the [employer] with a past history of [place].  Palliative Care notes reviewed. Will sign off.  Plan:  No post hospital transition of care needs noted. Will close and sign off at transition.    Natividad Brood, RN BSN [facility] Liaison  [phone] business mobile phone Toll free office [phone]  Fax number: [phone] [email or web address]

## 2019-09-13 NOTE — Death Summary Note (Addendum)
Death Summary    Patient ID: Stephanie [name] MRN: [id number]; DOB: 22-May-1934  Admit Date: 2019-09-16 Date of Death: 09-20-2019  Primary Care Provider: Abner Greenspan, MD  Primary Cardiologist: Ida Rogue, MD  Primary Electrophysiologist:  None   Discharge Diagnoses    Active Problems:   Type 2 diabetes mellitus ([place])   Acute on chronic combined systolic (congestive) and diastolic (congestive) heart failure (HCC)   Acute encephalopathy   Hallucination, visual   Palliative care by specialist   Goals of care, counseling/discussion   Terminal care   End of life care   At high risk for spiritual distress   DNR (do not resuscitate)   Acute heart failure [facility])   Atrial fibrillation ([place])    Diagnostic Studies/Procedures    Echocardiogram 08/31/2019 IMPRESSIONS    1. Left ventricular ejection fraction, by estimation, is <20%. The left  ventricle has severely decreased function. The left ventricle demonstrates  global hypokinesis. Left ventricular diastolic parameters are consistent  with Grade III diastolic  dysfunction (restrictive).   2. Right ventricular systolic function is normal. The right ventricular  size is normal. There is moderately elevated pulmonary artery systolic  pressure.   3. Left atrial size was severely dilated.   4. The mitral valve is normal in structure and function. Mild mitral  valve regurgitation. No evidence of mitral stenosis.   5. Tricuspid valve regurgitation is moderate.   6. The aortic valve is normal in structure and function. Aortic valve  regurgitation is not visualized. Mild to moderate aortic valve  sclerosis/calcification is present, without any evidence of aortic  stenosis.   7. The inferior vena cava is dilated in size with >50% respiratory  variability, suggesting right atrial pressure of 8 mmHg.   Comparison(s): The left ventricular function is worsened.  _____________   History of Present Illness     Stephanie [name] is a  86 y.o. female with history of coronary artery disease, carotid artery disease, type 2 diabetes mellitus, hyperlipidemia, hypertension who was admitted [facility] on the evening of 2019/09/16 with shortness of breath.  Stephanie [name] is typically seen by Dr. Esmond Plants in our [place] office.  She was last seen by Dr. Rockey Situ in November, 2020.  She has a history of a remote stroke and has been on Aggrenox. 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   She was admitted to the hospital in August, 2020 with supraventricular tachycardia at a heart rate of 196.  She converted to normal sinus rhythm with adenosine 6 mg.  Troponins were elevated and cath was recommended. Heart catheterization later that admission revealed a 50% left main stenosis, ostial LAD stenosis of 40%, sequential 40% then 50% left circumflex stenosis.  Medical therapy was recommended.   The patient had become very sedentary recently.  She used to be very active but was not doing any exercise at the time of her admission.   She received her first Covid vaccine 2 days PTA. She became somewhat confused and stopped all of her medications 5 to 7 days PTA. She woke up on the morning of presentation being very short of breath.  She had a cough.  She described pleuritic chest pain.  She denied coughing up any sputum or blood.  She denied any diarrhea or constipation.  She denied any leg edema.   She reported eating a very high salt diet.  She was eating bologna sandwiches almost every day  for lunch and going out to the [employer] cafeteria frequently through the week. . She denied any syncope or presyncope.  She denied any rash or skin nodules.  She received her Covid vaccine on Saturday and has some arm soreness but is otherwise doing okay. She denied any aches and pains. She admitted to not eating well for the past several days but had continued to eat salty foods when she did eat.   She stopped all  of her medications including her diabetic medicines 5 to 7 days PTA.  She was found to be in new onset atrial fibrillation.   She was admitted to the hospital for treatment of atrial fibrillation with RVR, acute combined systolic and diastolic heart failure and acute respiratory syndrome.     Hospital Course   Stephanie [name] had some supraventricular tachycardia that resolved with adenosine.  For atrial fibrillation the patient was restarted on metoprolol and Eliquis was initiated for stroke risk reduction.  She was also diuresed with Lasix for acute on chronic combined systolic and diastolic heart failure.  She converted to normal sinus rhythm/bradycardia with brief episodes of heart block with rates in the 30s on telemetry.  Beta-blocker was then discontinued.  Echocardiogram showed new low EF of less than 20%.   She had acute respiratory syndrome/suspected community-acquired pneumonia.  The [facility] hospitalist service was consulted.  D-dimer was elevated and CT angiogram of the chest showed signs of multifocal pneumonia, negative for PE.   IV antibiotics was considered but the patient's family declined.  She also had acute encephalopathy and hallucinations.  She was reportedly more confused than her normal.  Neurology was consulted.  MRI did reveal a small acute infarct in the anterior left frontal white matter.  Dr. Leonel Ramsay felt that her altered mental status was likely multifactorial related to pneumonia, CHF and stroke.  He did not feel like the stroke was the primary driver for her cognitive decline.  Psychiatry was also consulted due to her hallucinations and noted that the patient was unable to provide any meaningful participation in her assessment.  She was alert to self only.  They agreed that her confusion was most likely multifactorial due to her acute illness, pneumonia, heart failure and stroke and there was consideration to limiting use of certain medications with anticholinergic side  effects.  The patient's condition continued to deteriorate with kidney failure and metabolic acidosis.  In the setting of patient's failure to thrive, acute encephalopathy, acute CHF, new CVA, atrial fibrillation malnutrition palliative care was consulted.  The patient's husband had reported that the patient had been having progressively worsening confusion since Christmas and had been seeing people for the prior 3 weeks.  The palliative care nurse met with the family who expressed that they did not want Stephanie [name] to suffer.  They decided to focus on symptom relief through medications and nursing care.  The patient was made a DNR/DNI with hospital death anticipated.  Comfort medications were initiated including morphine drip and all nonessential medications were discontinued.  Eliquis was stopped. Chaplain services also provided spiritual support.  The patient was found with absent vital signs and asystole on the cardiac monitor by nursing staff on 29-Sep-2019.  Death was pronounced by 2 RNs.  Consultants: Dr Tamala Julian with[facility] hospitalist service Dr. Leonel Ramsay with neurology Dr. Parke Poisson with psychiatry Palliative care medicine Spiritual care  Time of death: 17:12 on Sep 29, 2019 _____________  Labs & Radiologic Studies    CBC No results for input(s): WBC, NEUTROABS, HGB, HCT,  MCV, PLT in the last 72 hours. Basic Metabolic Panel No results for input(s): NA, K, CL, CO2, GLUCOSE, BUN, CREATININE, CALCIUM, MG, PHOS in the last 72 hours. Liver Function Tests No results for input(s): AST, ALT, ALKPHOS, BILITOT, PROT, ALBUMIN in the last 72 hours. No results for input(s): LIPASE, AMYLASE in the last 72 hours. High Sensitivity Troponin:   Recent Labs  Lab 08/28/2019 1645 09/01/2019 1830 09/12/2019 2049 09/09/2019 2240  TROPONINIHS 70* 74* 76* 81*    BNP Invalid input(s): POCBNP D-Dimer No results for input(s): DDIMER in the last 72 hours. Hemoglobin A1C No results for input(s): HGBA1C in the last 72  hours. Fasting Lipid Panel No results for input(s): CHOL, HDL, LDLCALC, TRIG, CHOLHDL, LDLDIRECT in the last 72 hours. Thyroid Function Tests No results for input(s): TSH, T4TOTAL, T3FREE, THYROIDAB in the last 72 hours.  Invalid input(s): FREET3 _____________  CT ANGIO CHEST PE W OR WO CONTRAST  Result Date: 08/31/2019 CLINICAL DATA:  Shortness of breath and elevated D-dimer EXAM: CT ANGIOGRAPHY CHEST WITH CONTRAST TECHNIQUE: Multidetector CT imaging of the chest was performed using the standard protocol during bolus administration of intravenous contrast. Multiplanar CT image reconstructions and MIPs were obtained to evaluate the vascular anatomy. CONTRAST:  77m OMNIPAQUE IOHEXOL 350 MG/ML SOLN COMPARISON:  Chest x-ray from previous day. FINDINGS: Cardiovascular: Atherosclerotic calcifications thoracic aorta noted. No aneurysmal dilatation is seen. Opacification is decreased limiting evaluation for dissection. The pulmonary artery shows a normal branching pattern. No filling defect to suggest pulmonary embolus is noted. Coronary calcifications are noted. Cardiac enlargement is noted. Mediastinum/Nodes: Thoracic inlet is within normal limits. No hilar or mediastinal adenopathy is noted. The esophagus as visualized is within normal limits. Lungs/Pleura: Large bilateral pleural effusions are noted. Patchy multifocal infiltrates are noted within the upper lobes bilaterally as well as lower lobe consolidation. These changes are consistent with multifocal pneumonia. Upper Abdomen: Visualized upper abdomen demonstrates heavily calcified splenic artery aneurysm measuring approximately 2.1 cm. This is roughly stable when compared with prior CTs of the abdomen. Musculoskeletal: Degenerative changes of the thoracic spine are noted. Review of the MIP images confirms the above findings. IMPRESSION: Large bilateral pleural effusions with associated lower lobe consolidation. Multifocal infiltrates in the upper lobes  are noted bilaterally. These changes are likely infectious in etiology. No evidence of pulmonary emboli. Stable calcified splenic artery aneurysm. Electronically Signed   By: MInez CatalinaM.D.   On: 08/31/2019 14:20   MR BRAIN WO CONTRAST  Result Date: 09/01/2019 CLINICAL DATA:  Encephalopathy EXAM: MRI HEAD WITHOUT CONTRAST TECHNIQUE: Multiplanar, multiecho pulse sequences of the brain and surrounding structures were obtained without intravenous contrast. COMPARISON:  Brain MRI 02/17/2019 FINDINGS: Significantly motion degraded study which was aborted early. Only diffusion imaging was acquired (axial and coronal) and is positive for a linear infarct in the left anterior frontal white matter. No hydrocephalus or shift. IMPRESSION: 1. Truncated and motion degraded study, only diffusion imaging was acquired. 2. Small acute infarct in the anterior left frontal white matter. Electronically Signed   By: JMonte FantasiaM.D.   On: 09/01/2019 06:32   DG Chest Portable 1 View  Result Date: 08/22/2019 CLINICAL DATA:  Shortness of breath and chest pain EXAM: PORTABLE CHEST 1 VIEW COMPARISON:  02/15/2019 FINDINGS: Cardiac shadow is stable. Aortic calcifications are again seen. Diffuse vascular congestion is noted with interstitial edema. Small effusions are noted as well. Likely underlying atelectasis is seen as well. No bony abnormality is noted. IMPRESSION: Changes consistent with CHF. Bibasilar  atelectatic changes are also noted. Electronically Signed   By: Inez Catalina M.D.   On: 08/24/2019 16:46   ECHOCARDIOGRAM COMPLETE  Result Date: 08/31/2019    ECHOCARDIOGRAM REPORT   Patient Name:   Stephanie [name] Date of Exam: 08/31/2019 Medical Rec #:  [id number]      Height:       60.0 in Accession #:    [id number]     Weight:       106.9 lb Date of Birth:  01/10/1934      BSA:          1.43 m Patient Age:    21 years       BP:           144/97 mmHg Patient Gender: F              HR:           87 bpm. Exam Location:   Inpatient Procedure: 2D Echo, Cardiac Doppler and Color Doppler Indications:    Chest pain  History:        Patient has prior history of Echocardiogram examinations, most                 recent 02/16/2019. CHF, CAD, Stroke, Arrythmias:Atrial                 Fibrillation, Signs/Symptoms:Shortness of Breath and Altered                 Mental Status; Risk Factors:Hypertension, Dyslipidemia and                 Diabetes.  Sonographer:    Dustin Flock Referring Phys: [phone] HAO MENG IMPRESSIONS  1. Left ventricular ejection fraction, by estimation, is <20%. The left ventricle has severely decreased function. The left ventricle demonstrates global hypokinesis. Left ventricular diastolic parameters are consistent with Grade III diastolic dysfunction (restrictive).  2. Right ventricular systolic function is normal. The right ventricular size is normal. There is moderately elevated pulmonary artery systolic pressure.  3. Left atrial size was severely dilated.  4. The mitral valve is normal in structure and function. Mild mitral valve regurgitation. No evidence of mitral stenosis.  5. Tricuspid valve regurgitation is moderate.  6. The aortic valve is normal in structure and function. Aortic valve regurgitation is not visualized. Mild to moderate aortic valve sclerosis/calcification is present, without any evidence of aortic stenosis.  7. The inferior vena cava is dilated in size with >50% respiratory variability, suggesting right atrial pressure of 8 mmHg. Comparison(s): The left ventricular function is worsened. FINDINGS  Left Ventricle: Left ventricular ejection fraction, by estimation, is <20%. The left ventricle has severely decreased function. The left ventricle demonstrates global hypokinesis. The left ventricular internal cavity size was normal in size. There is no  left ventricular hypertrophy. Left ventricular diastolic parameters are consistent with Grade III diastolic dysfunction (restrictive). Right Ventricle:  The right ventricular size is normal. No increase in right ventricular wall thickness. Right ventricular systolic function is normal. There is moderately elevated pulmonary artery systolic pressure. The tricuspid regurgitant velocity is 3.37 m/s, and with an assumed right atrial pressure of 8 mmHg, the estimated right ventricular systolic pressure is 96.2 mmHg. Left Atrium: Left atrial size was severely dilated. Right Atrium: Right atrial size was normal in size. Pericardium: There is no evidence of pericardial effusion. Mitral Valve: The mitral valve is normal in structure and function. Normal mobility of the mitral valve leaflets. Mild mitral valve regurgitation.  No evidence of mitral valve stenosis. Tricuspid Valve: The tricuspid valve is normal in structure. Tricuspid valve regurgitation is moderate . No evidence of tricuspid stenosis. Aortic Valve: The aortic valve is normal in structure and function.. There is moderate thickening and moderate calcification of the aortic valve. Aortic valve regurgitation is not visualized. Mild to moderate aortic valve sclerosis/calcification is present, without any evidence of aortic stenosis. There is moderate thickening of the aortic valve. There is moderate calcification of the aortic valve. Pulmonic Valve: The pulmonic valve was normal in structure. Pulmonic valve regurgitation is mild to moderate. No evidence of pulmonic stenosis. Aorta: The aortic root is normal in size and structure. Venous: The inferior vena cava is dilated in size with greater than 50% respiratory variability, suggesting right atrial pressure of 8 mmHg. IAS/Shunts: No atrial level shunt detected by color flow Doppler.  LEFT VENTRICLE PLAX 2D LVIDd:         4.31 cm  Diastology LVIDs:         3.91 cm  LV e' lateral:   6.31 cm/s LV PW:         0.87 cm  LV E/e' lateral: 19.3 LV IVS:        0.87 cm  LV e' medial:    4.35 cm/s LVOT diam:     1.70 cm  LV E/e' medial:  28.0 LV SV:         31.78 ml LV SV  Index:   11.99 LVOT Area:     2.27 cm  RIGHT VENTRICLE RV Basal diam:  2.33 cm RV S prime:     6.31 cm/s TAPSE (M-mode): 1.7 cm LEFT ATRIUM             Index       RIGHT ATRIUM           Index LA diam:        4.70 cm 3.29 cm/m  RA Area:     16.80 cm LA Vol (A2C):   93.0 ml 65.02 ml/m RA Volume:   48.60 ml  33.98 ml/m LA Vol (A4C):   80.7 ml 56.42 ml/m LA Biplane Vol: 91.4 ml 63.90 ml/m  AORTIC VALVE LVOT Vmax:   74.60 cm/s LVOT Vmean:  47.800 cm/s LVOT VTI:    0.140 m  AORTA Ao Root diam: 2.80 cm MITRAL VALVE                TRICUSPID VALVE MV Area (PHT): 4.31 cm     TR Peak grad:   45.4 mmHg MV Decel Time: 176 msec     TR Vmax:        337.00 cm/s MV E velocity: 122.00 cm/s MV A velocity: 24.50 cm/s   SHUNTS MV E/A ratio:  4.98         Systemic VTI:  0.14 m                             Systemic Diam: 1.70 cm Candee Furbish MD Electronically signed by Candee Furbish MD Signature Date/Time: 08/31/2019/1:27:42 PM    Final     Duration of Death Summary Encounter   Greater than 30 minutes including physician time.  Signed, Daune Perch, NP 09/08/2019, 2:16 PM  Attending Note:   The patient was seen and examined.  Agree with assessment and plan as noted above.  Changes made to the above note as needed.  Patient seen and independently examined with  Pecolia Ades, NP .   We discussed all aspects of the encounter. I agree with the assessment and plan as stated above.     Acute on chronic CHF:  pt had severe LV dysfunction on admission .   Was also found to have a stroke with deteriating mental status.   She had new onset AFib and pneumonia on CXR.   She steadily declined .     I had multiple discussions with husband and the rest of the family.   She had expressed previously that she did not want to be on a vent.    She was mad comfort care / palliative care and placed on a morphine drip.   She passed away on 2022-09-10 .    I have spent a total of 40 minutes with patient reviewing hospital  notes ,  telemetry, EKGs, labs and examining patient as well as establishing an assessment and plan that was discussed with the patient.  > 50% of time was spent in direct patient care.    Thayer Headings, Brooke Bonito., MD, [facility] 09/09/2019, 4:00 PM [address]  [place] Pager [phone]

## 2019-09-13 DEATH — deceased

## 2019-09-18 ENCOUNTER — Ambulatory Visit: Payer: Medicare Other

## 2019-09-20 ENCOUNTER — Ambulatory Visit: Payer: Medicare Other | Admitting: Podiatry

## 2019-09-22 ENCOUNTER — Ambulatory Visit: Payer: Medicare Other | Admitting: Family Medicine

## 2019-09-27 ENCOUNTER — Ambulatory Visit: Payer: Medicare Other | Admitting: Podiatry

## 2019-11-16 ENCOUNTER — Ambulatory Visit: Payer: Medicare Other | Admitting: Cardiovascular Disease

## 2019-11-16 IMAGING — CT CT HEAD WITHOUT CONTRAST
4 of 6 series · 16 of 47 positions shown, 18 images · non-contrast
Comparison: Head and cervical spine CT report dated 01/21/2018 and
head CT dated 03/28/2017.

CLINICAL DATA: Altered mental status following a fall today.

EXAM:
CT HEAD WITHOUT CONTRAST
CT CERVICAL SPINE WITHOUT CONTRAST
TECHNIQUE: Multidetector CT imaging of the head and cervical spine was
performed following the standard protocol without intravenous
contrast. Multiplanar CT image reconstructions of the cervical spine
were also generated.

[Series 3: head 5.0 h30s · axial · 0.43mm/px · z∈[-94,+6]mm · 5 of 31 slices shown, 7 images]
[im 6/31  brain]
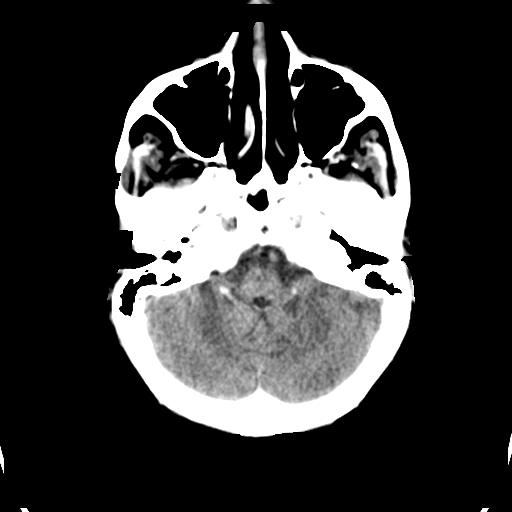
[im 6/31  bone]
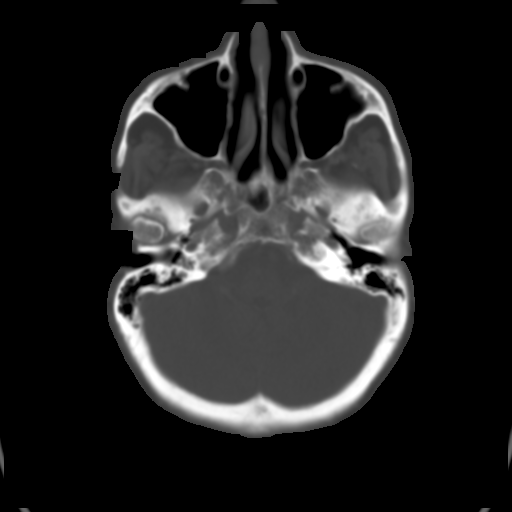
[im 11/31  brain]
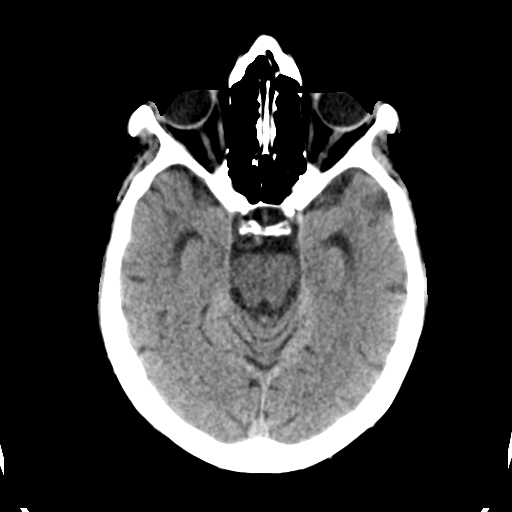
[im 16/31  brain]
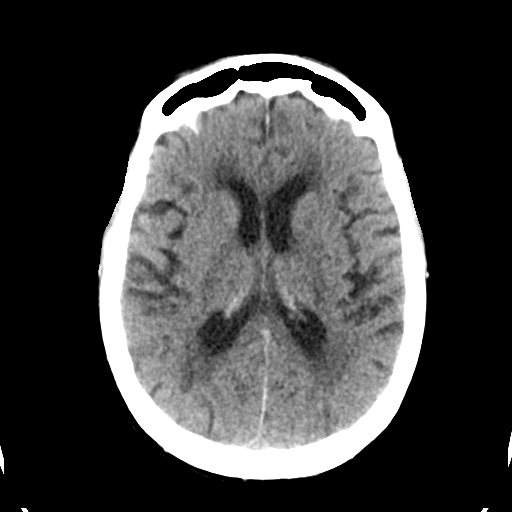
[im 21/31  brain]
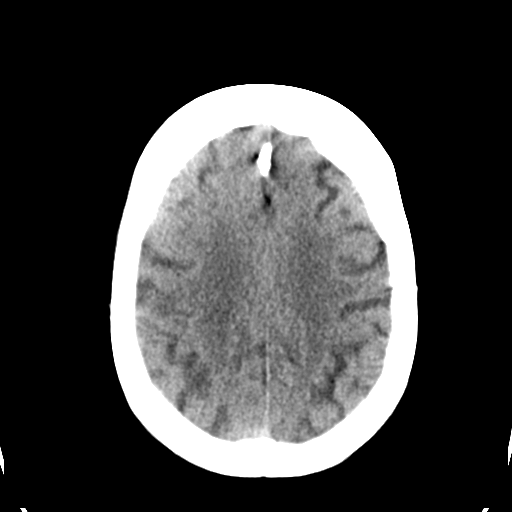
[im 26/31  brain]
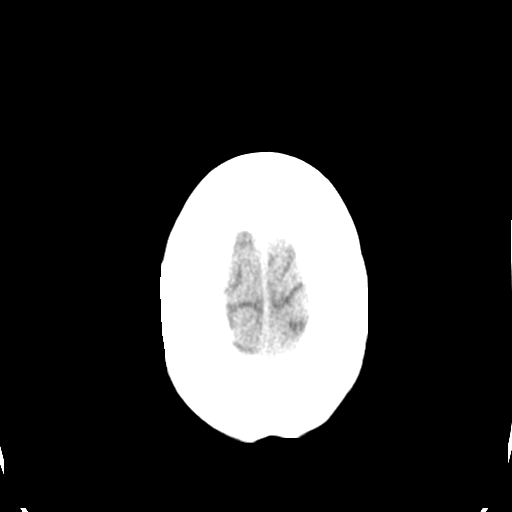
[im 26/31  bone]
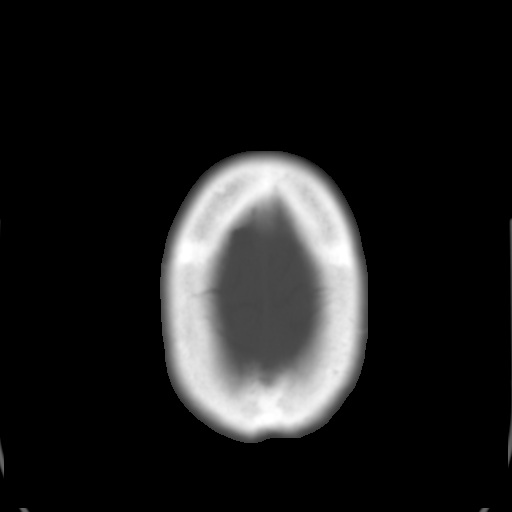

[Series 5: head 3.0 mpr cor · coronal · 0.30mm/px · 3 of 71 slices shown]
[im 21/71  brain]
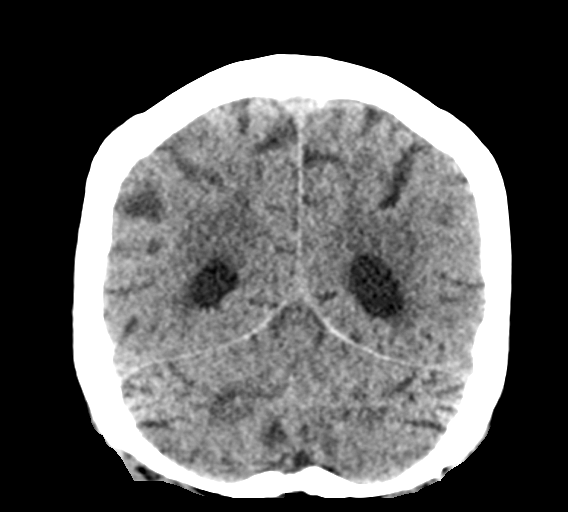
[im 31/71  brain]
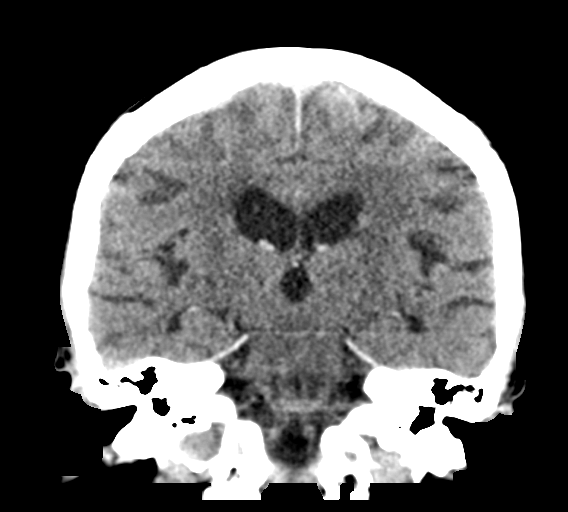
[im 41/71  brain]
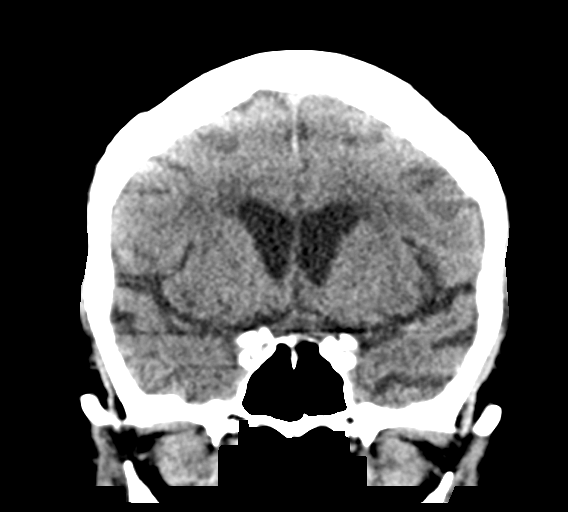

[Series 6: head 3.0 mpr sag · sagittal · 0.30mm/px · 1 of 56 slices shown]
[im 28/56  brain]
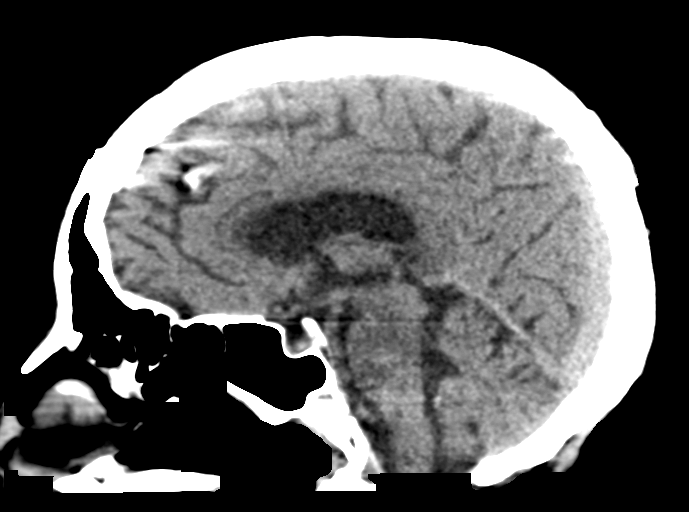

[Series 8: c_spine 2.0 st · axial · 0.29mm/px · z∈[-270,-124]mm · 7 of 100 slices shown]
[im 10/100  brain]
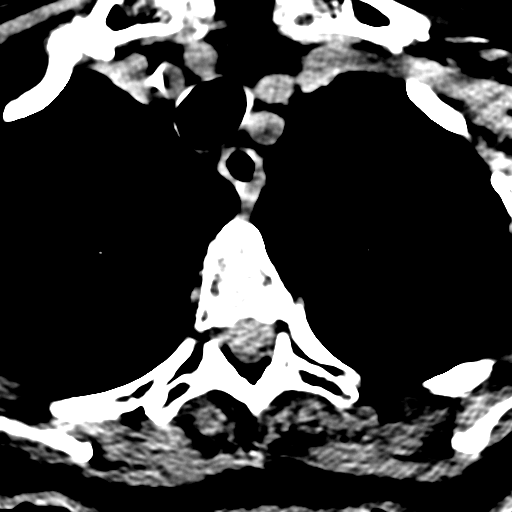
[im 19/100  brain]
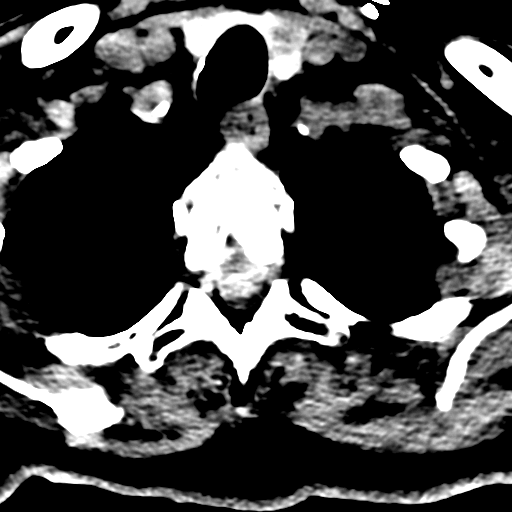
[im 32/100  brain]
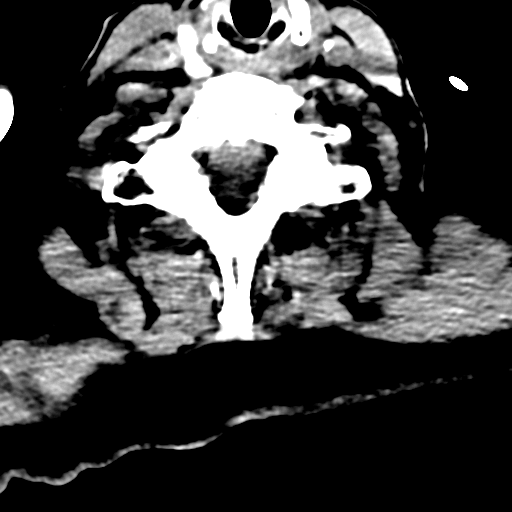
[im 46/100  brain]
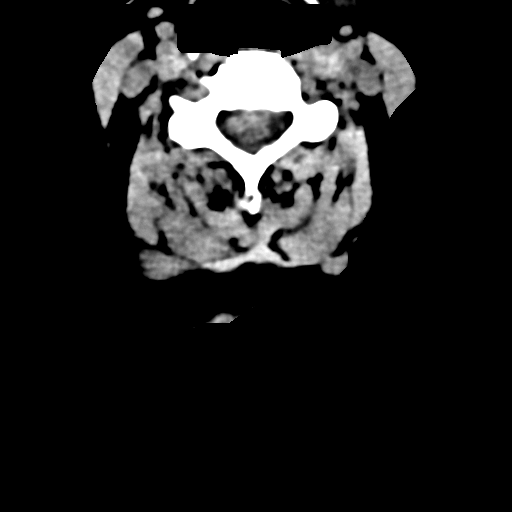
[im 55/100  brain]
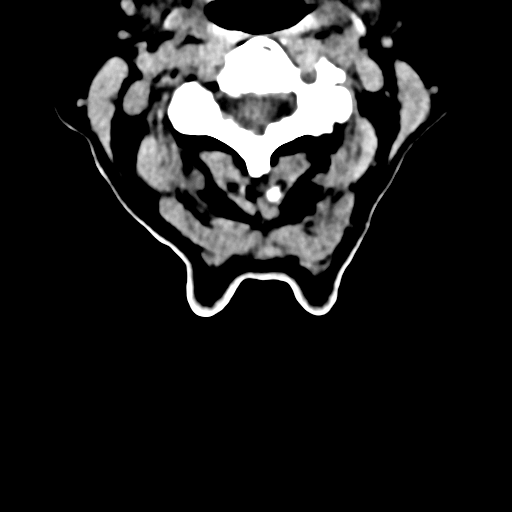
[im 68/100  brain]
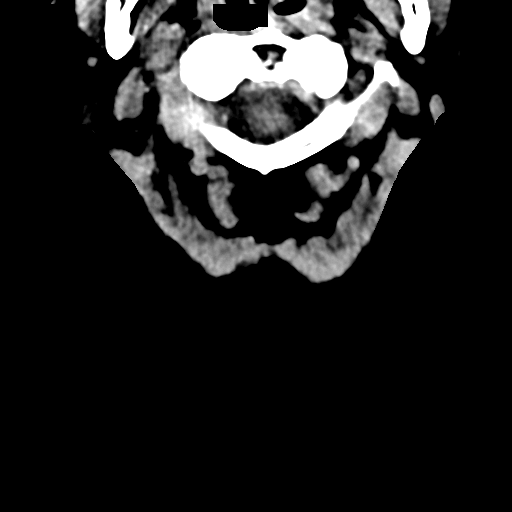
[im 82/100  brain]
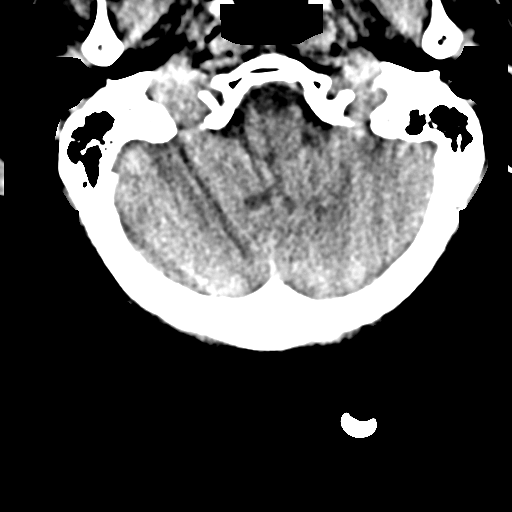

[16 of 47 positions shown; findings below may reference images not displayed]

FINDINGS: CT HEAD FINDINGS

Brain: No significant change in mild-to-moderate enlargement of the
ventricles and subarachnoid spaces. Moderate patchy white matter low
density in both cerebral hemispheres with mild progression. No
intracranial hemorrhage, mass lesion or CT evidence of acute
infarction.

Vascular: No hyperdense vessel or unexpected calcification.

Skull: Normal. Negative for fracture or focal lesion.

Sinuses/Orbits: Unremarkable.

Other: None.

CT CERVICAL SPINE FINDINGS

Alignment: Mild dextroconvex cervicothoracic scoliosis. No
subluxations.

Skull base and vertebrae: No acute fracture. No primary bone lesion
or focal pathologic process.

Soft tissues and spinal canal: No prevertebral fluid or swelling. No
visible canal hematoma.

Disc levels: Multilevel degenerative changes, most pronounced at the
C5-6 and C6-7 levels.

Upper chest: Minimal biapical pleural and parenchymal scarring.

Other: 1.2 cm right lobe thyroid nodule an additional smaller poorly
defined nodules. Dense bilateral carotid artery calcifications.
IMPRESSION: 1. No skull fracture or intracranial hemorrhage.
2. No cervical spine fracture or subluxation.
3. Stable mild-to-moderate diffuse cerebral and cerebellar atrophy.
4. Moderate small vessel white matter ischemic changes in both
cerebral hemispheres with mild progression.
5. Multilevel cervical spine degenerative changes.
6. Dense bilateral carotid artery atheromatous calcifications.
7. Multinodular thyroid gland with a 1.2 cm heterogeneous nodule on
the right. Consider further evaluation with elective thyroid
ultrasound. If patient is clinically hyperthyroid, consider nuclear
medicine thyroid uptake and scan.
